# Patient Record
Sex: Male | Born: 1955
Health system: Southern US, Community
[De-identification: ages and names within clinical notes are randomized; demographics above are authoritative.]

## PROBLEM LIST (undated history)

## (undated) DIAGNOSIS — K259 Gastric ulcer, unspecified as acute or chronic, without hemorrhage or perforation: Secondary | ICD-10-CM

## (undated) DIAGNOSIS — E785 Hyperlipidemia, unspecified: Secondary | ICD-10-CM

## (undated) DIAGNOSIS — I10 Essential (primary) hypertension: Secondary | ICD-10-CM

## (undated) DIAGNOSIS — F329 Major depressive disorder, single episode, unspecified: Secondary | ICD-10-CM

## (undated) DIAGNOSIS — F419 Anxiety disorder, unspecified: Secondary | ICD-10-CM

## (undated) DIAGNOSIS — M1990 Unspecified osteoarthritis, unspecified site: Secondary | ICD-10-CM

## (undated) DIAGNOSIS — K5792 Diverticulitis of intestine, part unspecified, without perforation or abscess without bleeding: Secondary | ICD-10-CM

## (undated) DIAGNOSIS — R55 Syncope and collapse: Secondary | ICD-10-CM

## (undated) DIAGNOSIS — F32A Depression, unspecified: Secondary | ICD-10-CM

## (undated) HISTORY — DX: Essential (primary) hypertension: I10

## (undated) HISTORY — DX: Hyperlipidemia, unspecified: E78.5

## (undated) HISTORY — PX: COLON SURGERY: SHX602

## (undated) HISTORY — DX: Depression, unspecified: F32.A

## (undated) HISTORY — PX: OTHER SURGICAL HISTORY: SHX169

## (undated) HISTORY — DX: Anxiety disorder, unspecified: F41.9

## (undated) HISTORY — PX: COLOSTOMY: SHX63

---
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
## 2002-01-18 ENCOUNTER — Emergency Department (HOSPITAL_COMMUNITY): Admission: EM | Admit: 2002-01-18 | Discharge: 2002-01-18 | Payer: Self-pay | Admitting: *Deleted

## 2008-11-23 IMAGING — CR DG ABD PORTABLE 1V
1 series · 1 of 1 positions shown · non-contrast
Comparison: [DATE]

CLINICAL DATA: Colitis.  Abdominal pain.

ABDOMEN - 1 VIEW

[AP]
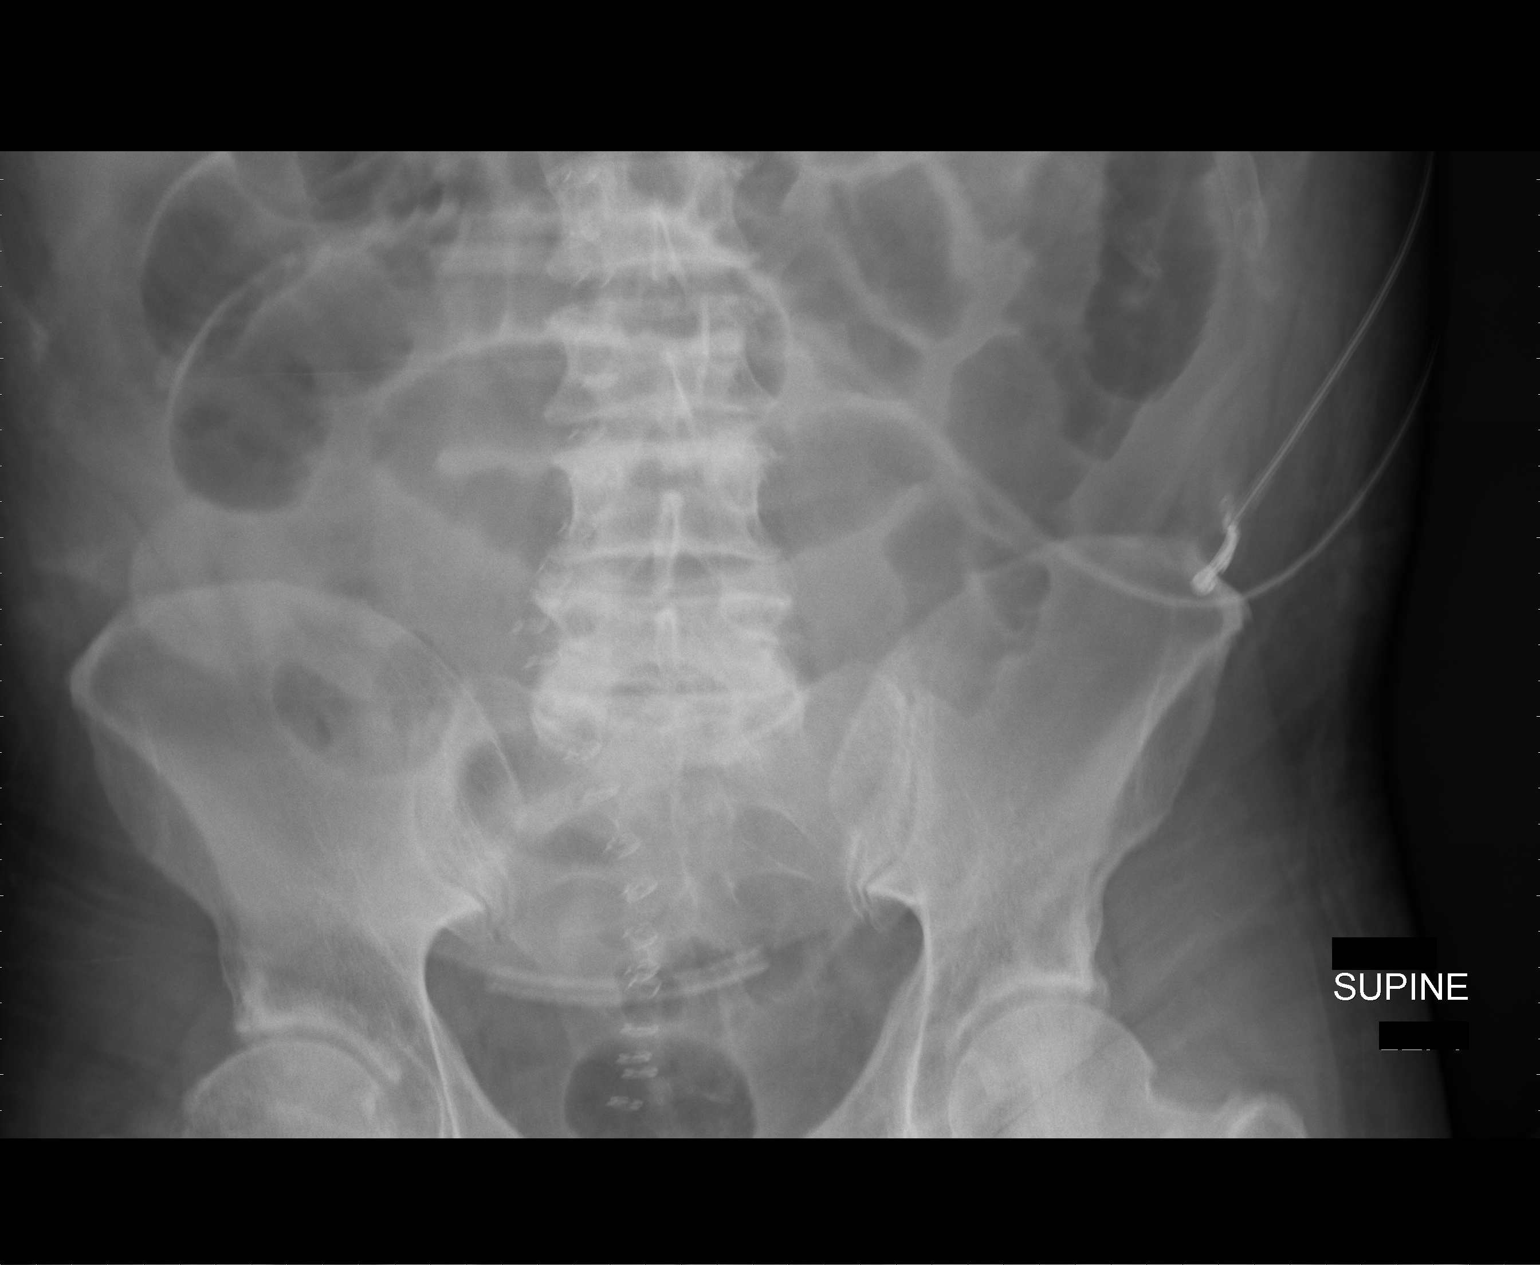

[1 of 1 positions shown; findings below may reference images not displayed]

FINDINGS: Vertical row of superficial staples projects over the
midline pelvis.  The gas distention of multiple bowel loops
primarily small bowel is compatible with an ileus.  Distention is
of a lesser degree than noted on [DATE].
IMPRESSION: Nonspecific ileus.

## 2008-11-24 ENCOUNTER — Inpatient Hospital Stay (HOSPITAL_COMMUNITY): Admission: EM | Admit: 2008-11-24 | Discharge: 2008-12-08 | Payer: Self-pay | Admitting: Emergency Medicine

## 2008-11-24 ENCOUNTER — Ambulatory Visit: Payer: Self-pay | Admitting: Pulmonary Disease

## 2008-11-24 IMAGING — CT CT PELVIS W/ CM
2 of 5 series · 16 of 46 positions shown, 18 images · IV contrast (APPLIED)
Comparison: None

CT ABDOMEN

CLINICAL DATA: Abdominal pain and distention.

CT ABDOMEN AND PELVIS WITH CONTRAST
TECHNIQUE: Multidetector CT imaging of the abdomen and pelvis was
performed using the standard protocol following bolus
administration of intravenous contrast.
Contrast: 100 ml [8M]

[Series 3: abd/pelv with 5.0 b31f st · axial · 0.87mm/px · z∈[-496,-46]mm · 13 of 101 slices shown, 15 images]
[im 6/101  soft-tissue]
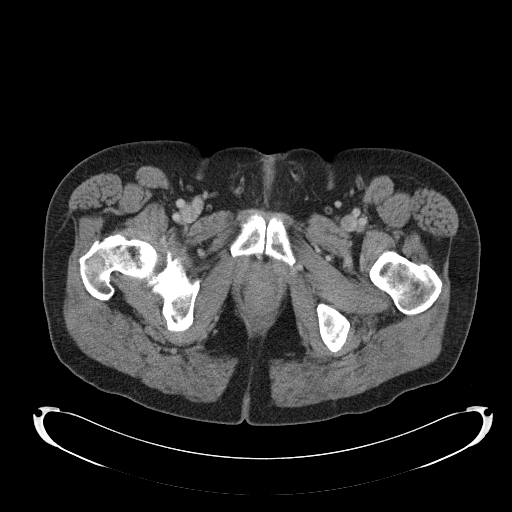
[im 6/101  bone]
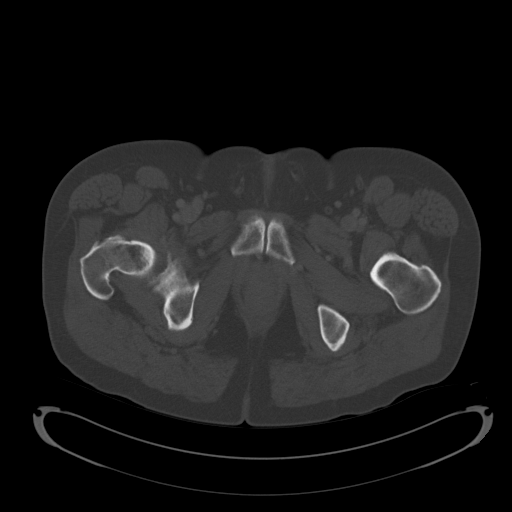
[im 16/101  soft-tissue]
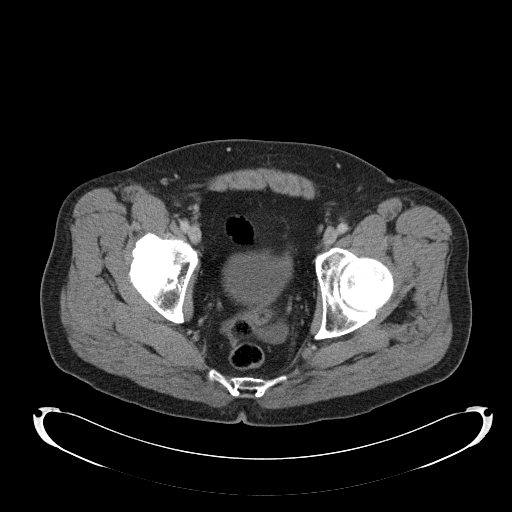
[im 21/101  soft-tissue]
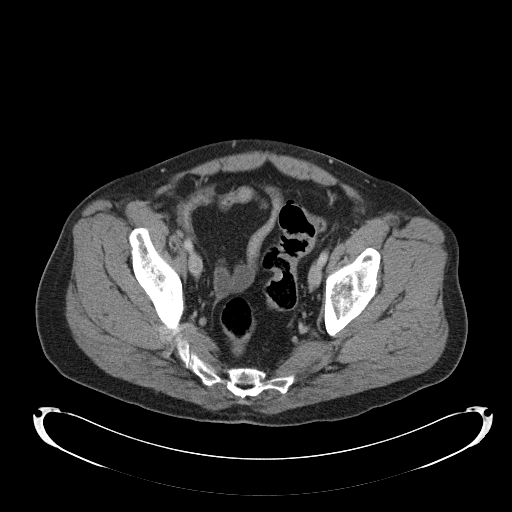
[im 31/101  soft-tissue]
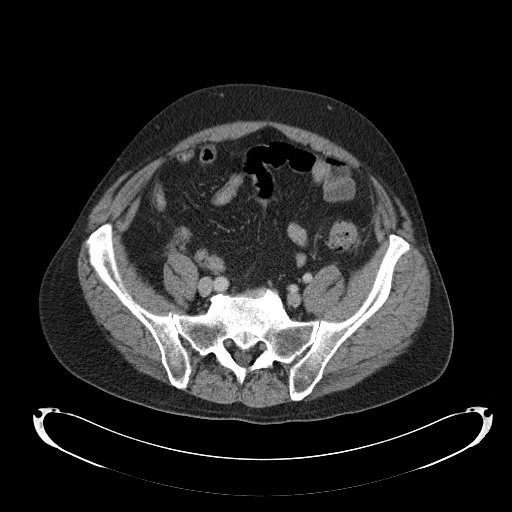
[im 36/101  soft-tissue]
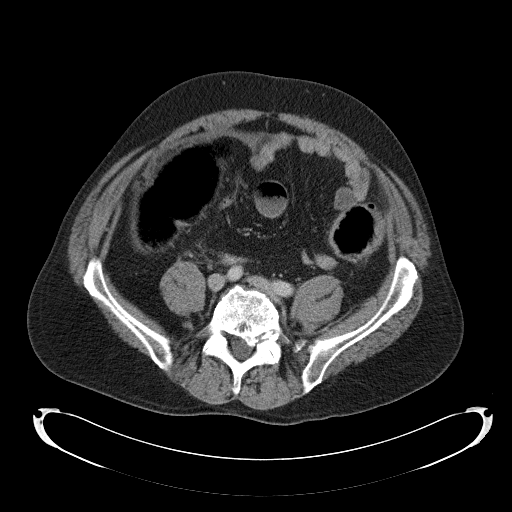
[im 46/101  soft-tissue]
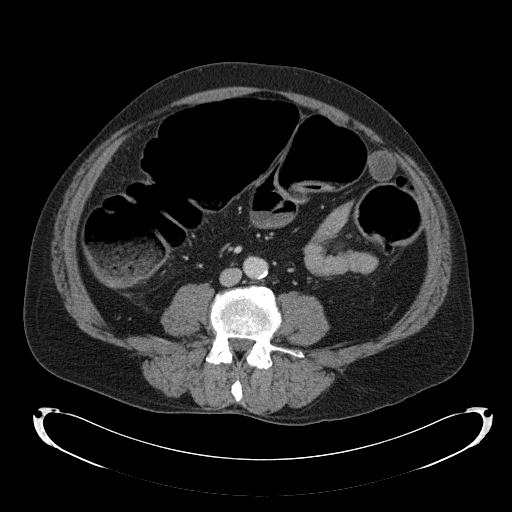
[im 51/101  soft-tissue]
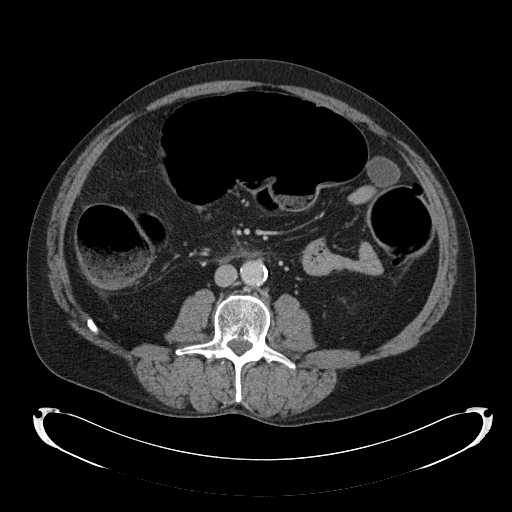
[im 56/101  soft-tissue]
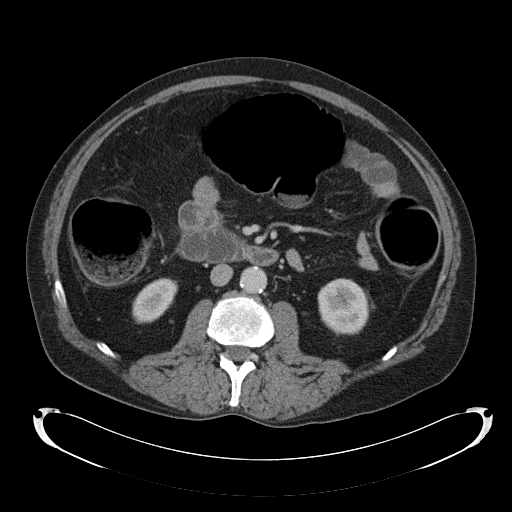
[im 66/101  soft-tissue]
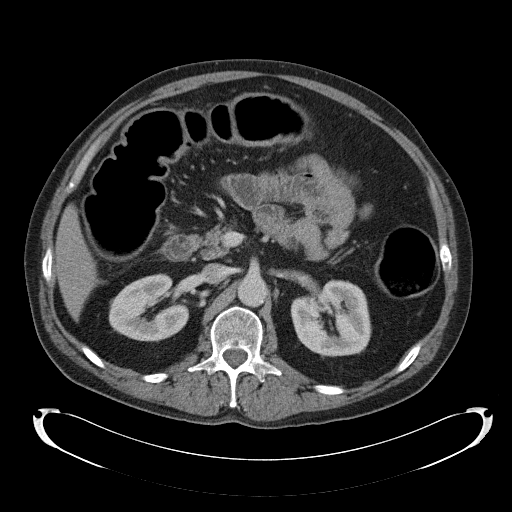
[im 66/101  bone]
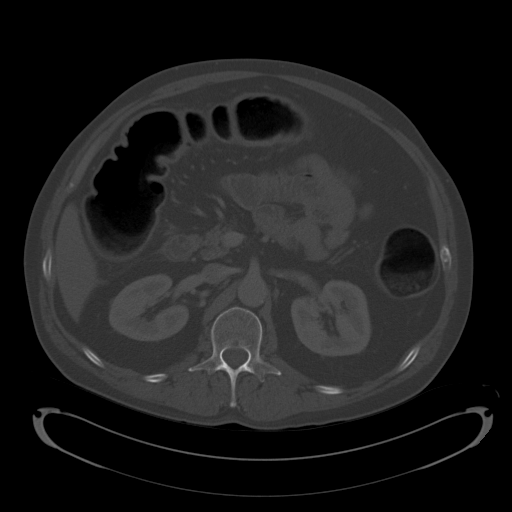
[im 71/101  soft-tissue]
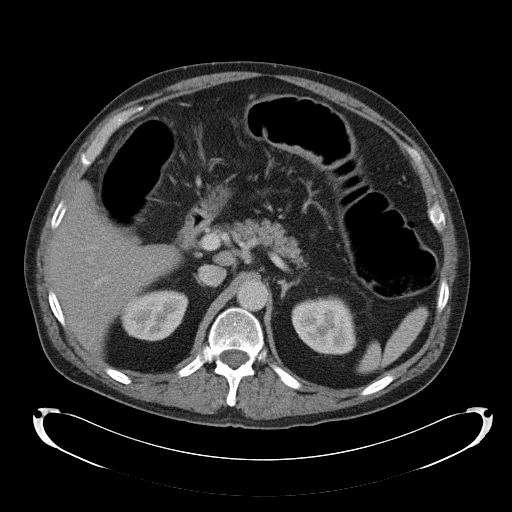
[im 81/101  soft-tissue]
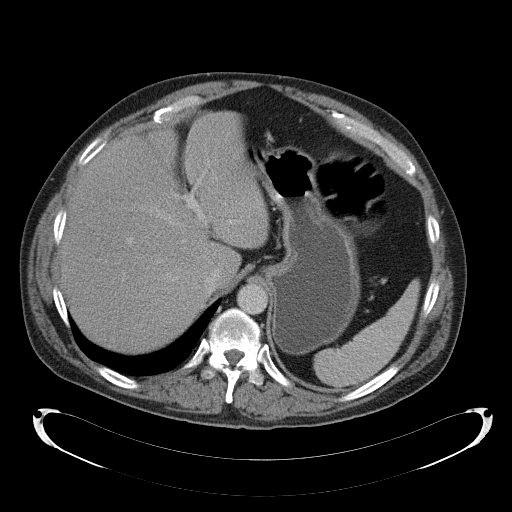
[im 86/101  soft-tissue]
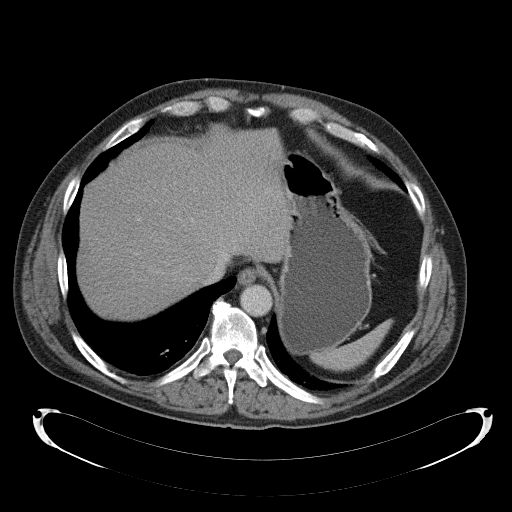
[im 96/101  soft-tissue]
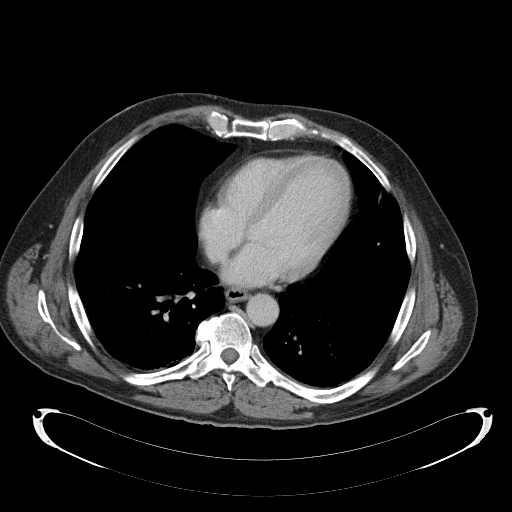

[Series 5: abd/pelv with 3.0 cor · coronal · 0.98mm/px · 3 of 92 slices shown]
[im 31/92  soft-tissue]
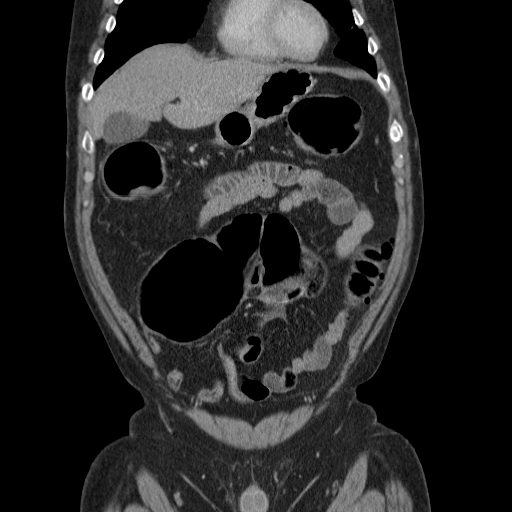
[im 41/92  soft-tissue]
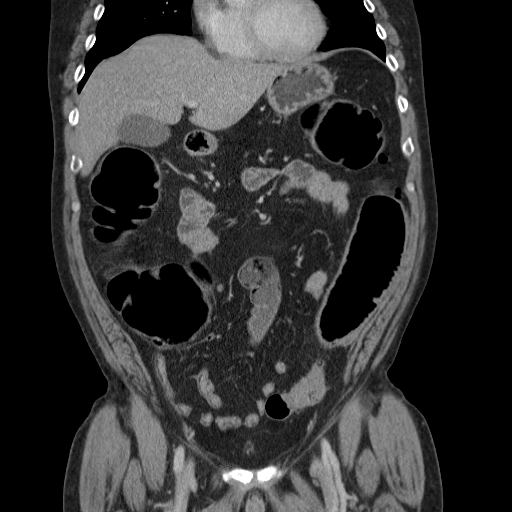
[im 51/92  soft-tissue]
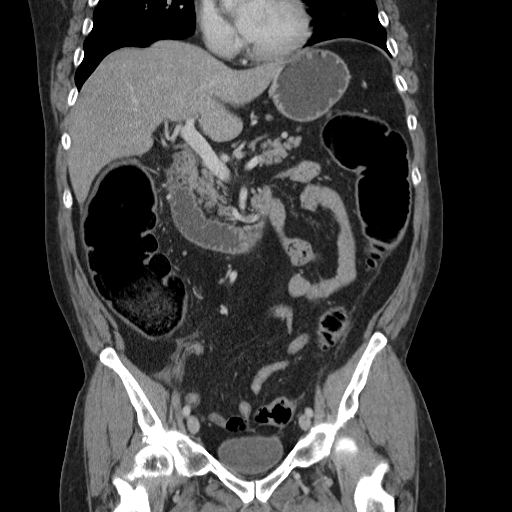

[16 of 46 positions shown; findings below may reference images not displayed]

FINDINGS: Lung bases are clear.  No pleural or pericardial fluid.
The patient does have a well circumscribed nodule on the right
middle lobe on image #1 that measures 5-6 mm in size. There is a 3
mm nodule the right middle lobe on image #6.

The liver appears normal.  No calcified gallstones.  The spleen is
normal.  The pancreas is normal.  The adrenal glands are normal.
The kidneys are normal.  There is atherosclerosis of the aorta but
no aneurysm.  The IVC is normal.  No retroperitoneal mass or
adenopathy.  No free intraperitoneal fluid .  There is pronounced
distention of the colon with low density stool and air.  The colon
is distended as far as the sigmoid region where it regains a more
normal caliber.  I think there is slight thickening of the wall of
the colon and there could be low-level colitis.  In the sigmoid
region, the patient has diverticulosis.  I do not  see definite
evidence of active diverticulitis.  Caliber change in this region
could be due to  muscular hypertrophy due to chronic
diverticulosis.  An occult mass is not excluded.  The appendix is
normal.
IMPRESSION: 5-6 mm nodule in the right middle lobe on image #1.  3 mm nodule on
the right middle lobe on image six.  Are there old films for
comparison?  If the patient is at low risk of malignancy, follow-up
CT is suggested at 12 months.  If the patient is at high risk of
malignancy, follow-up CT is suggested in 6 months. This
recommendation follows the consensus statement: "Guidelines for
Management of Small Pulmonary Nodules Detected on CT Scans:  A
Statement from the [HOSPITAL]" as published in Radiology
[8M]; [DATE].  Available online at:
[URL]

Distention of the colon with air and low density stool from the
cecum as far as the sigmoid region.  Some wall thickening of the
colon suggesting low-level colitis.  See above discussion.

CT PELVIS
FINDINGS: There is a tiny amount of free fluid in the pelvis.  The
bladder, prostate gland seminal vesicles are unremarkable.  No mass
or adenopathy.  See above discussion of bowel findings.
IMPRESSION: No additional significant findings in the pelvis.  See above.

## 2008-11-25 ENCOUNTER — Ambulatory Visit: Payer: Self-pay | Admitting: Gastroenterology

## 2008-11-25 IMAGING — CR DG ABDOMEN 1V
2 series · 2 of 2 positions shown · non-contrast
Comparison: CT abdomen pelvis of [DATE]

CLINICAL DATA: Abdominal pain, possible colitis

ABDOMEN - 1 VIEW

[t abdomen supine (1 of 2)]
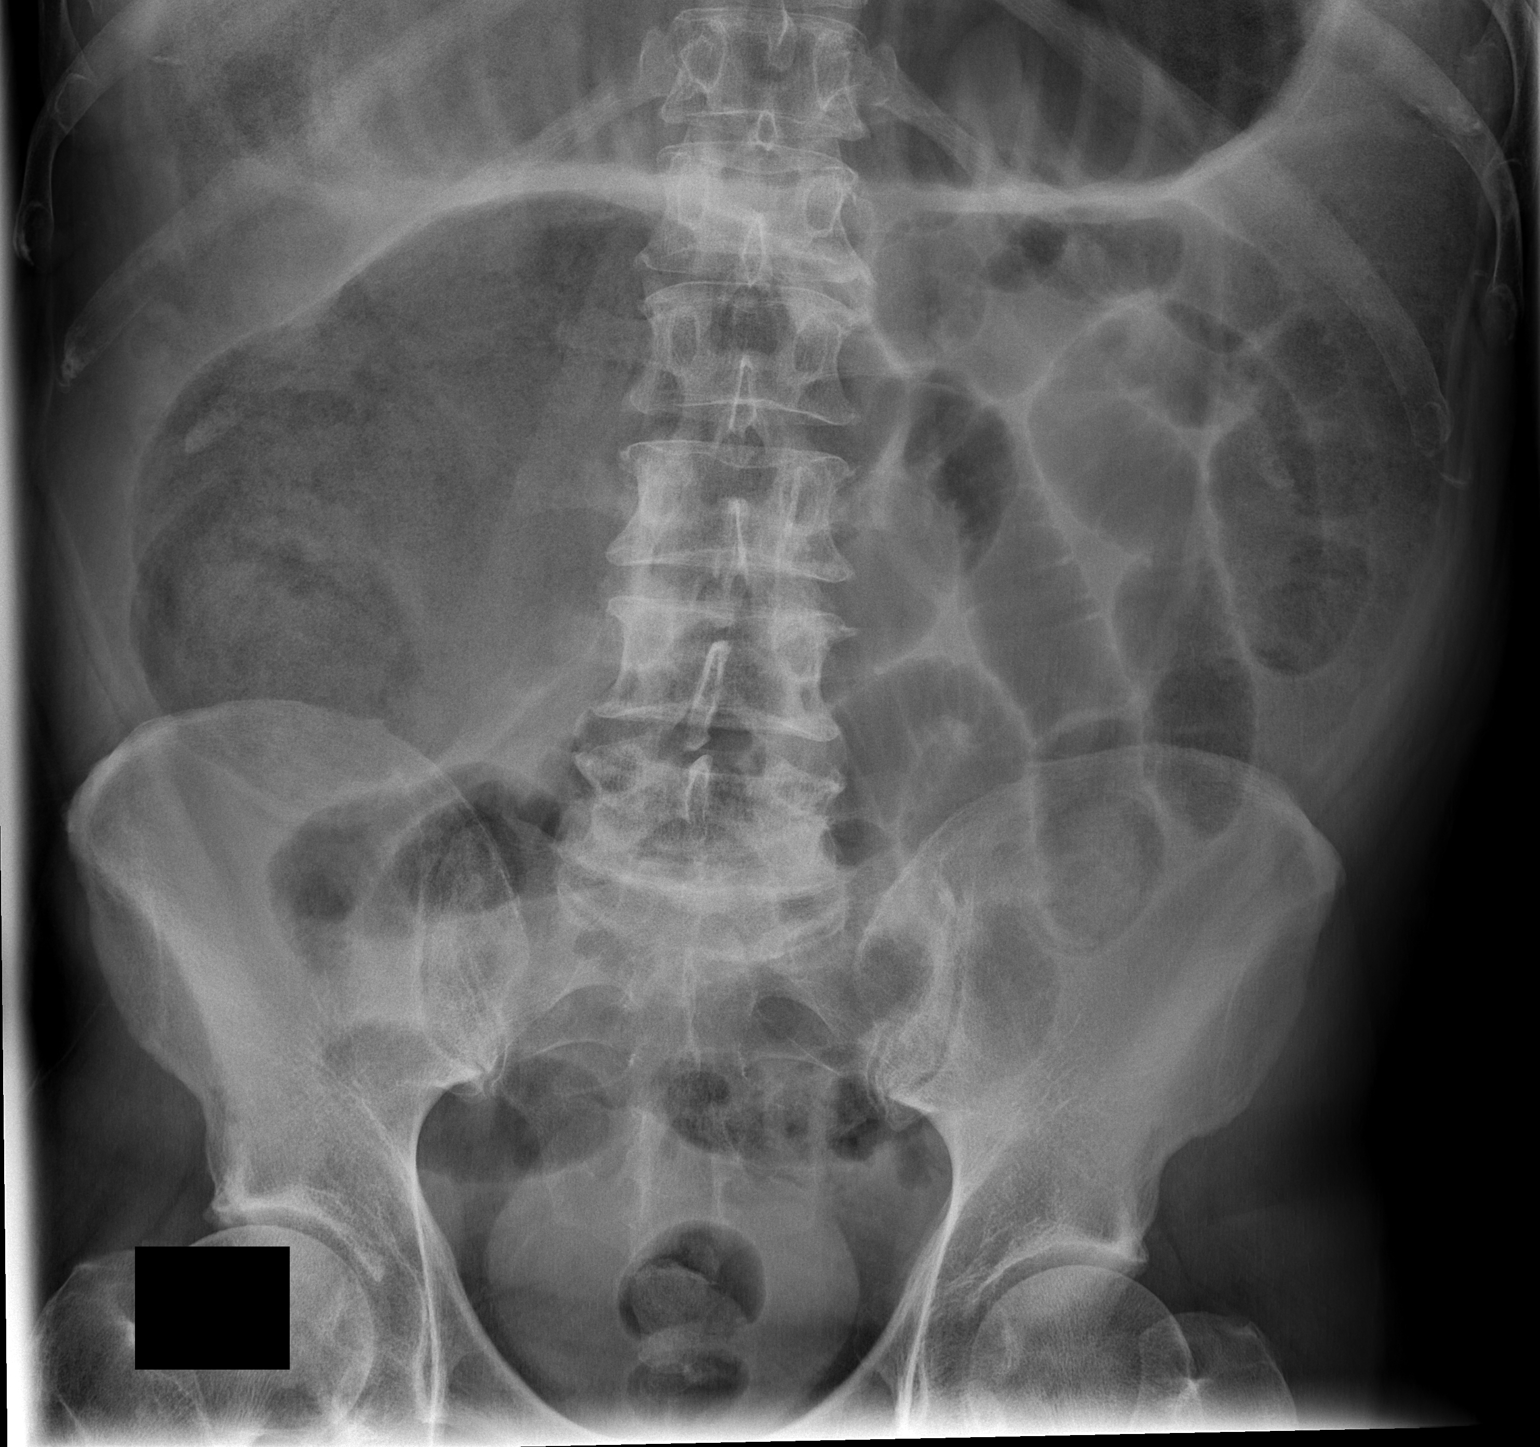

[t abdomen supine (2 of 2)]
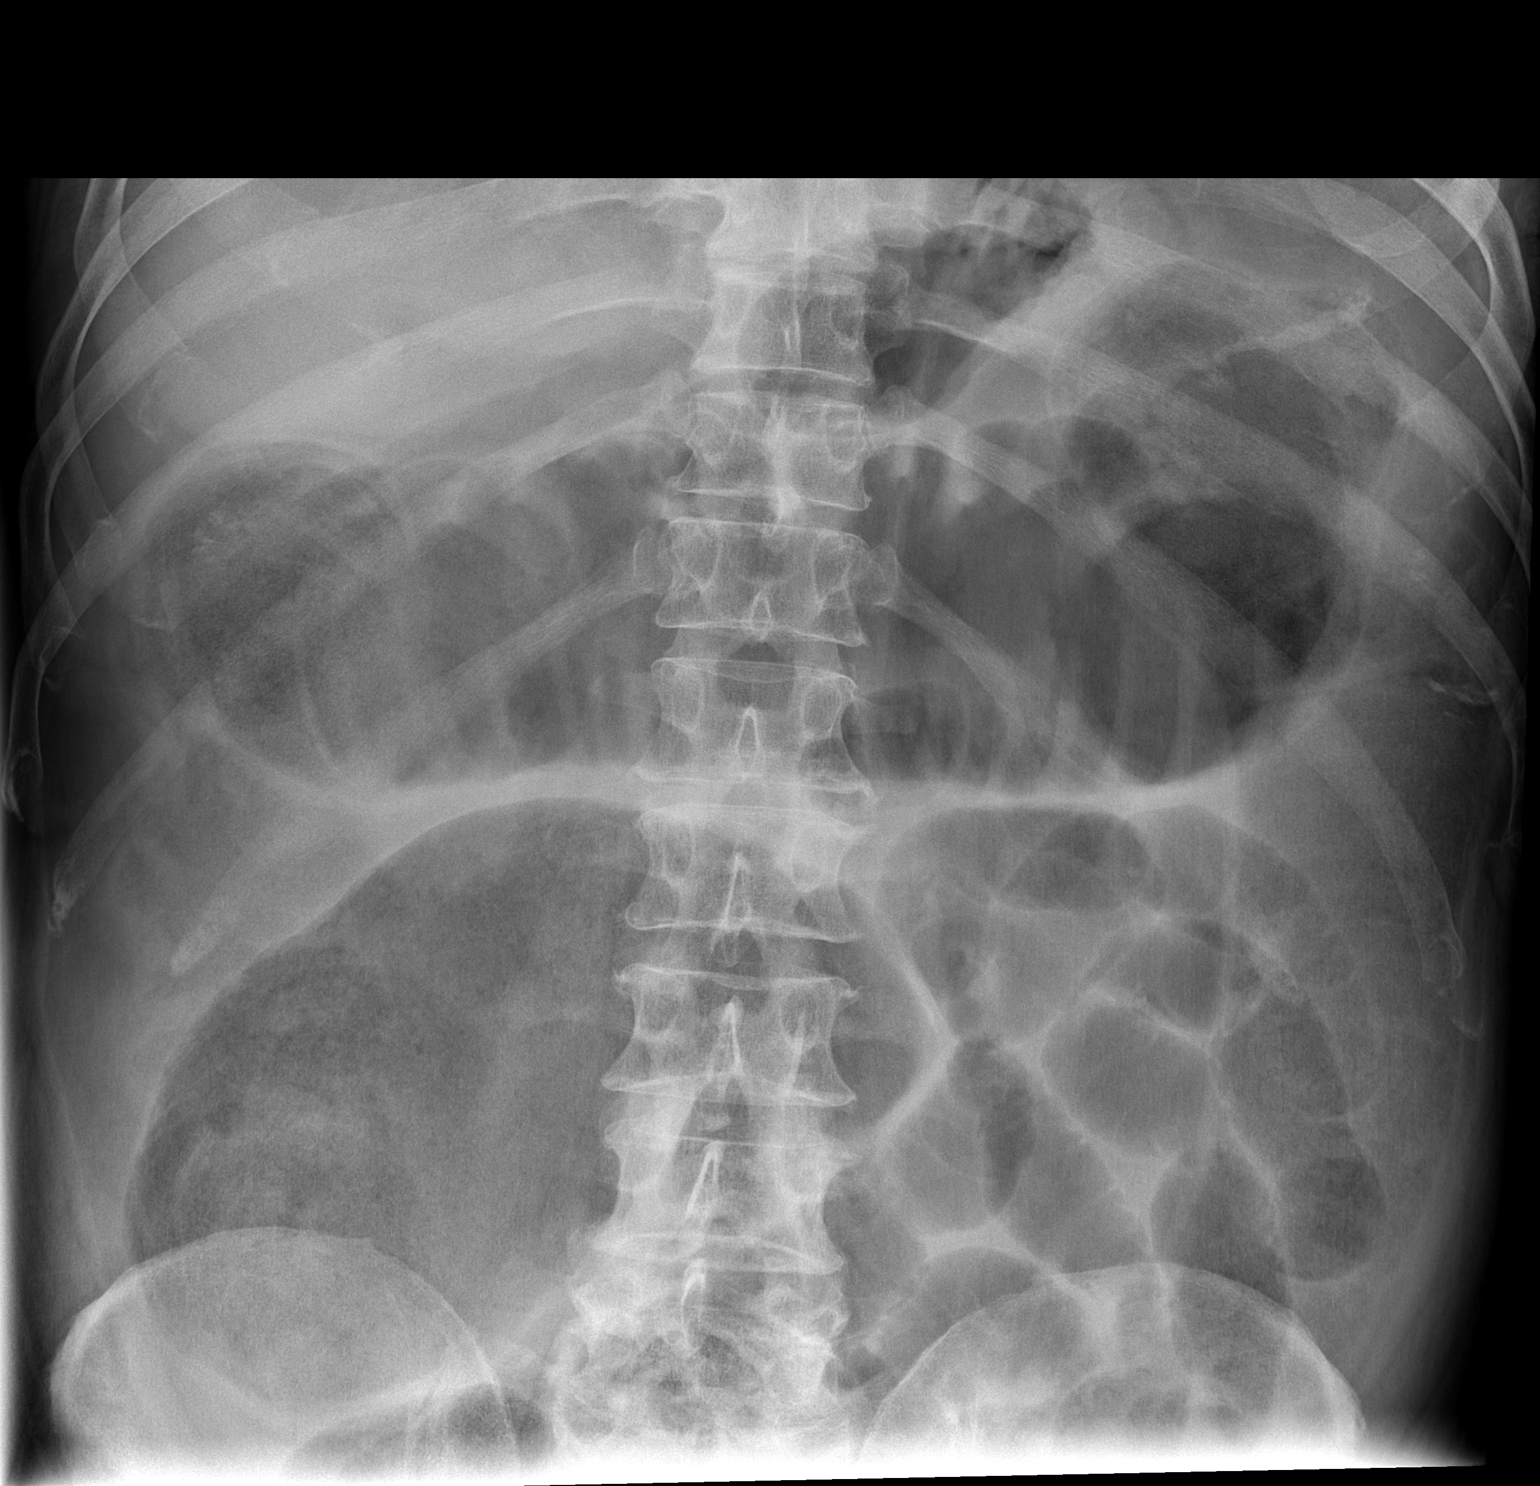

[2 of 2 positions shown; findings below may reference images not displayed]

FINDINGS: There is gaseous distention of the right colon and small
bowel.  In reviewing the CT from yesterday, these changes may be
due to colitis with some thickening of the mucosa of the colon
despite the degree of gaseous distention.  A partially obstructing
distal descending colon lesion cannot be excluded.
IMPRESSION: No significant change in gaseous distention of the small bowel and
colon as described above.  Again consider changes of colitis versus
a partially obstructing distal descending colon lesion.

## 2008-11-26 ENCOUNTER — Encounter: Payer: Self-pay | Admitting: Gastroenterology

## 2008-11-26 IMAGING — RF DG ABDOMEN 2V
1 series · 3 of 3 positions shown · non-contrast
Comparison: Abdomen film of [DATE]

CLINICAL DATA: Abdominal pain, colitis

ABDOMEN - 2 VIEW

[Series 1: run · 3 of 3 slices shown]
[im 1/3]
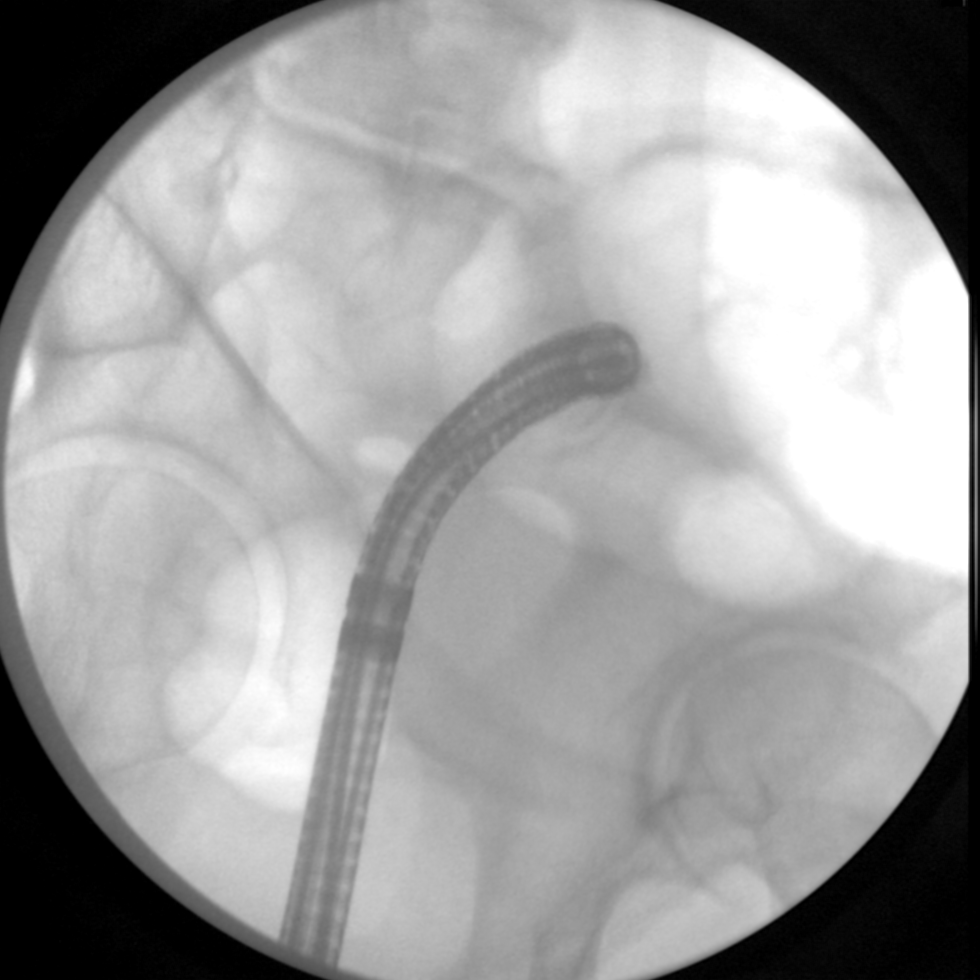
[im 2/3]
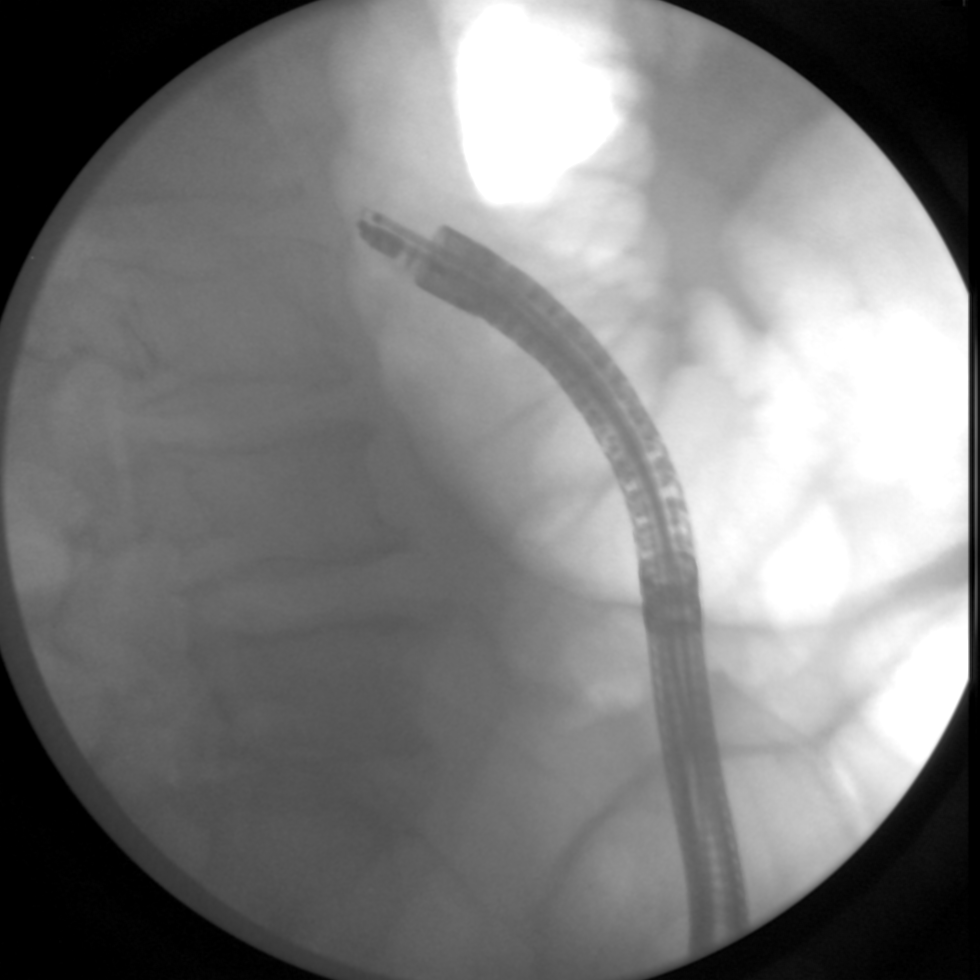
[im 3/3]
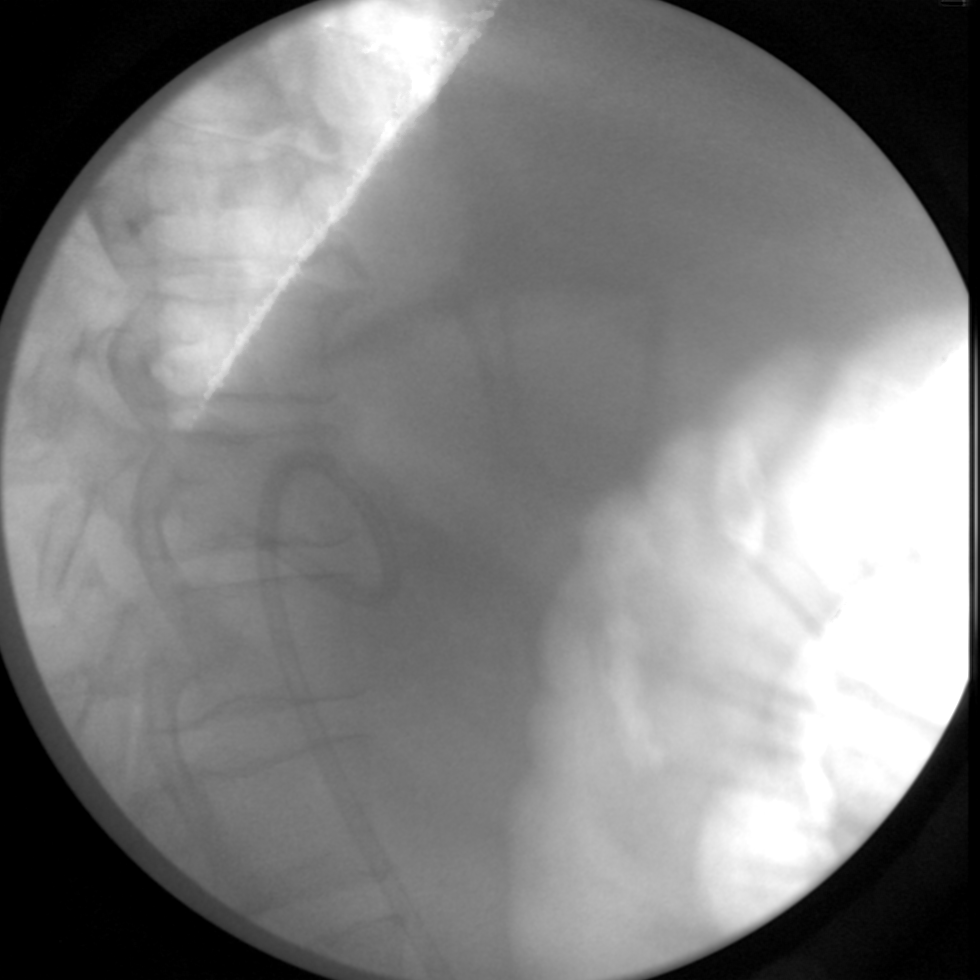

[3 of 3 positions shown; findings below may reference images not displayed]

FINDINGS: A rectal tube was placed for colonic decompression.  On
the limited field of view films obtained it is difficult to
determine exact position of the tube.
IMPRESSION: Rectal tube placed for colonic decompression.

## 2008-11-26 IMAGING — CR DG CHEST 2V
2 series · 2 of 2 positions shown · non-contrast
Comparison: No chest radiograph comparison.

CLINICAL DATA: Abdominal pain.  Colitis.

CHEST - 2 VIEW

[w chest pa]
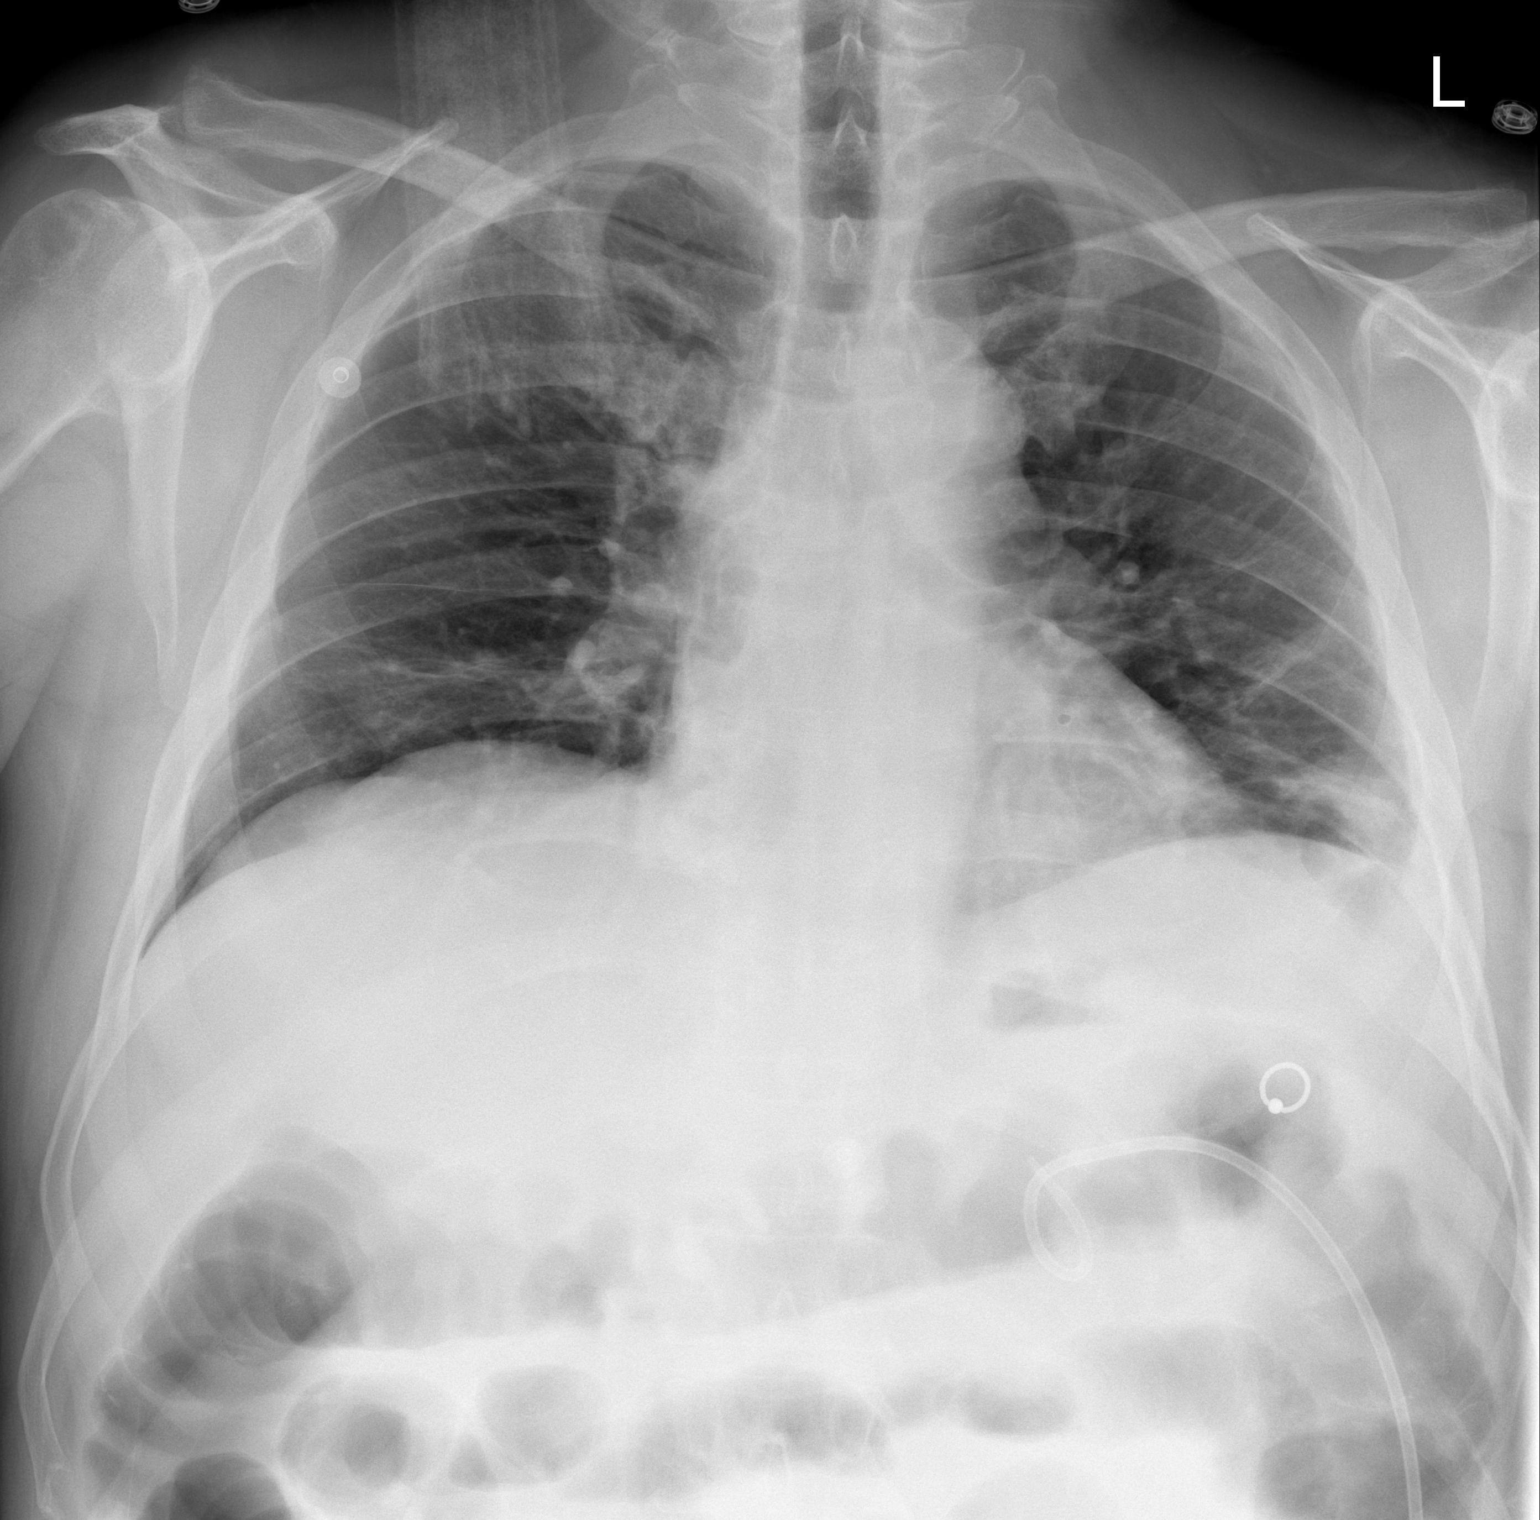

[w chest lat]
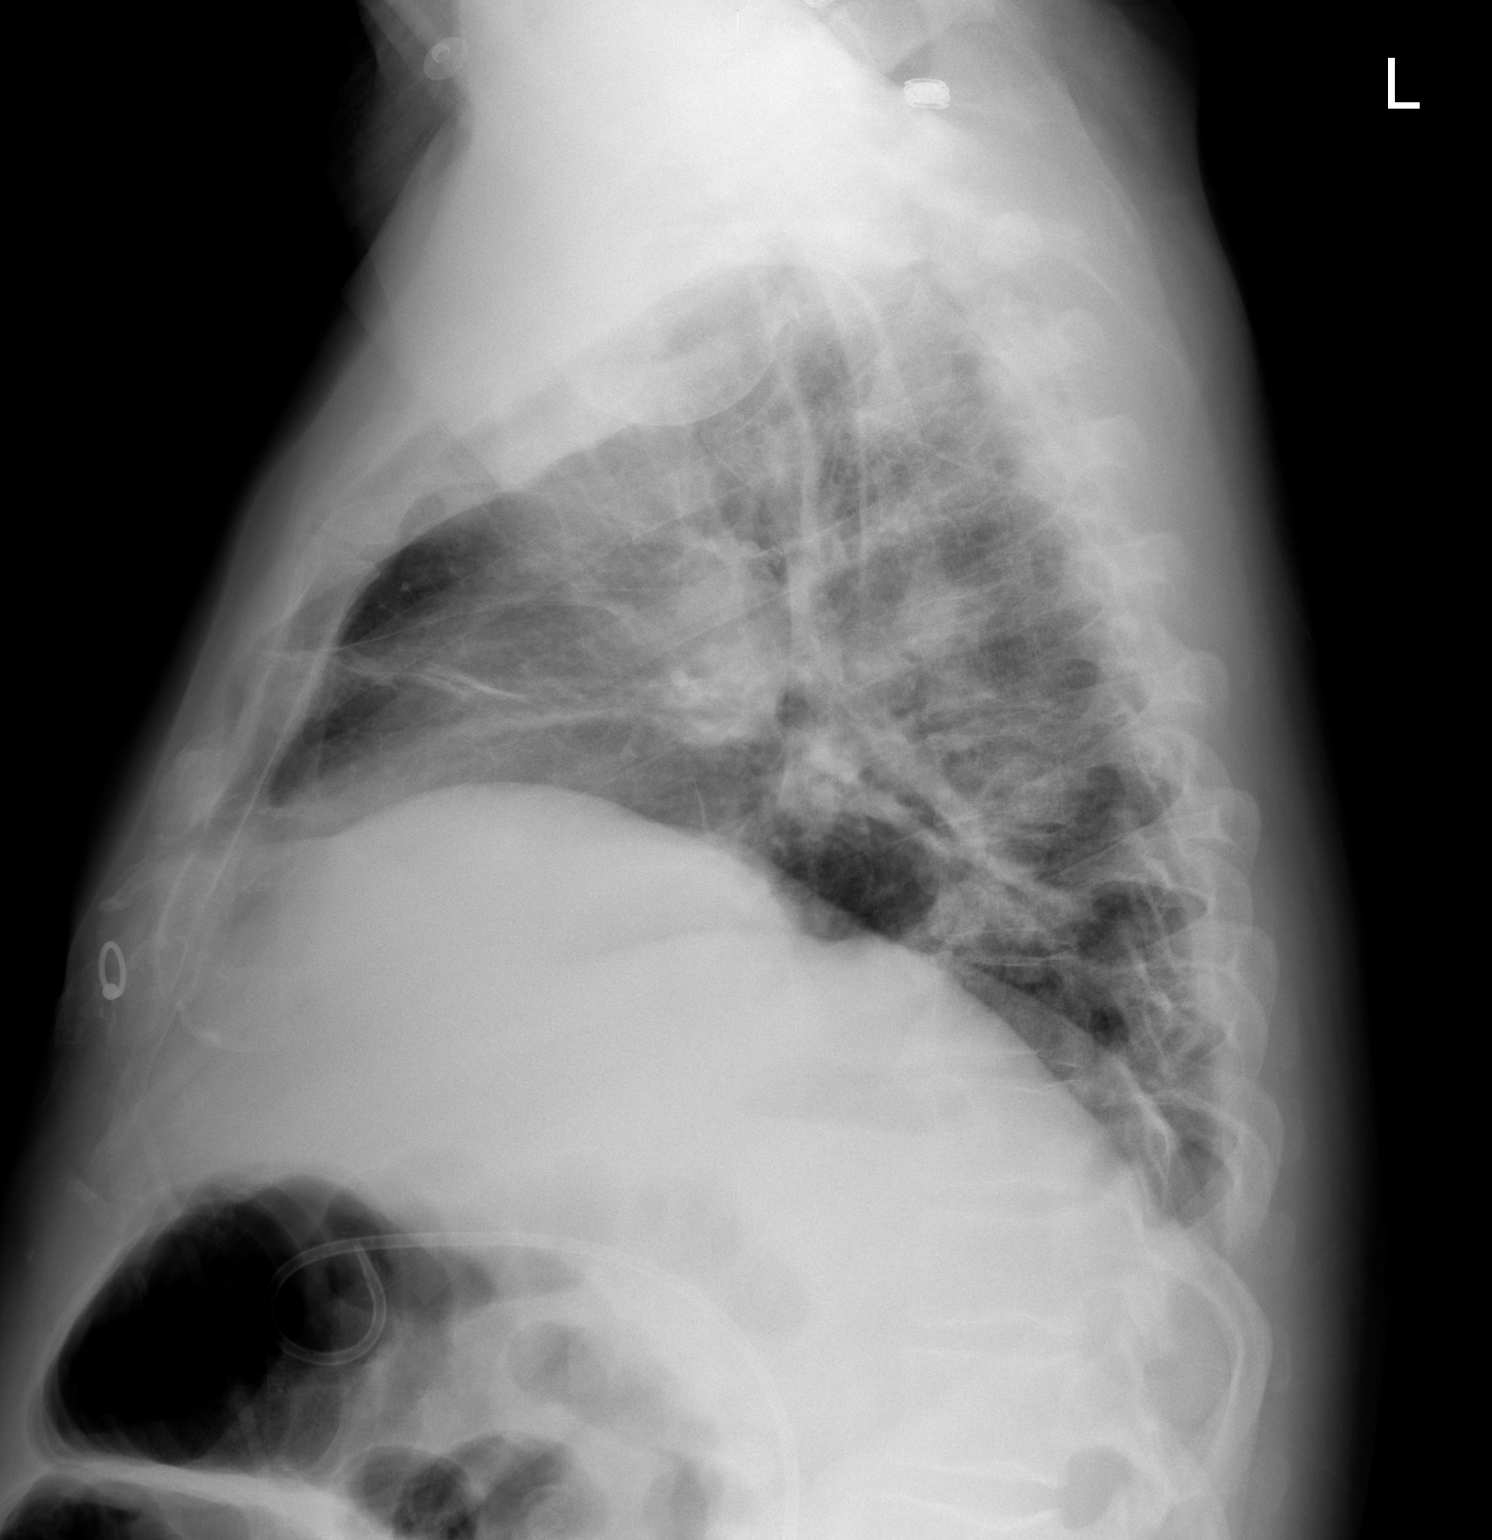

[2 of 2 positions shown; findings below may reference images not displayed]

FINDINGS: Lung volumes are low.  There is bibasilar atelectasis.
More focal density is present at the left costophrenic angle, which
probably represents discoid atelectasis.  It is difficult to
exclude a small focus of airspace disease based on the opacity
however given the low inspiratory volumes, atelectasis seems more
likely.  The cardiopericardial silhouette is within normal limits
allowing for low volumes.  Aortic contour normal.  Object external
to the patient, likely clothing is projected over the right upper
chest.  Catheter is present in the left upper quadrant.
IMPRESSION: 1.  Low volume chest with bibasilar atelectasis.  More focal
density at the left costophrenic angle probably represents discoid
atelectasis over the focus of infection/pneumonia.

## 2008-11-27 ENCOUNTER — Encounter (INDEPENDENT_AMBULATORY_CARE_PROVIDER_SITE_OTHER): Payer: Self-pay | Admitting: General Surgery

## 2008-11-27 IMAGING — CR DG ABDOMEN 1V
2 series · 2 of 2 positions shown · non-contrast
Comparison: Plain film chest [DATE] abdomen [DATE]

CLINICAL DATA: Abdominal pain, colitis

ABDOMEN - 1 VIEW

[t abdomen supine (1 of 2)]
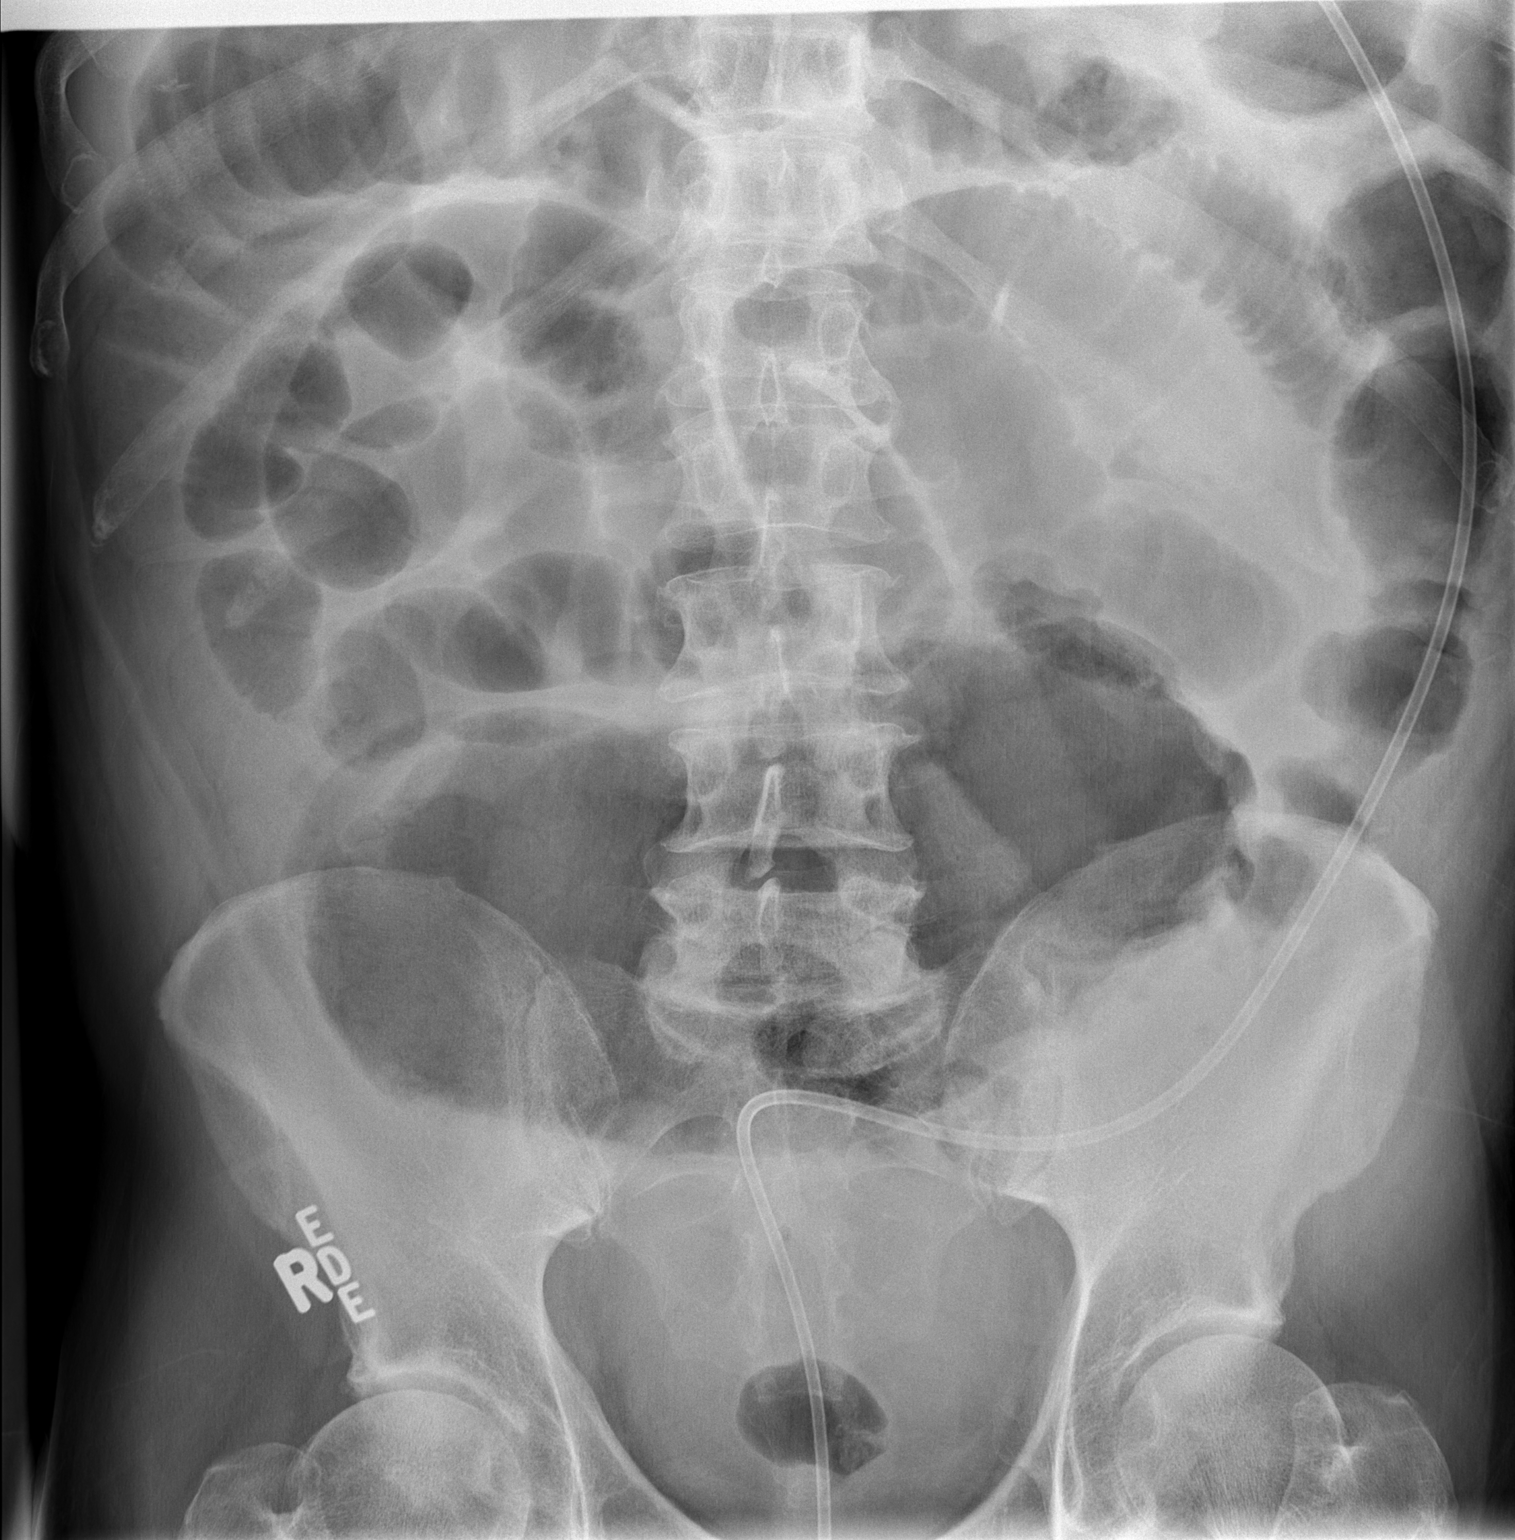

[t abdomen supine (2 of 2)]
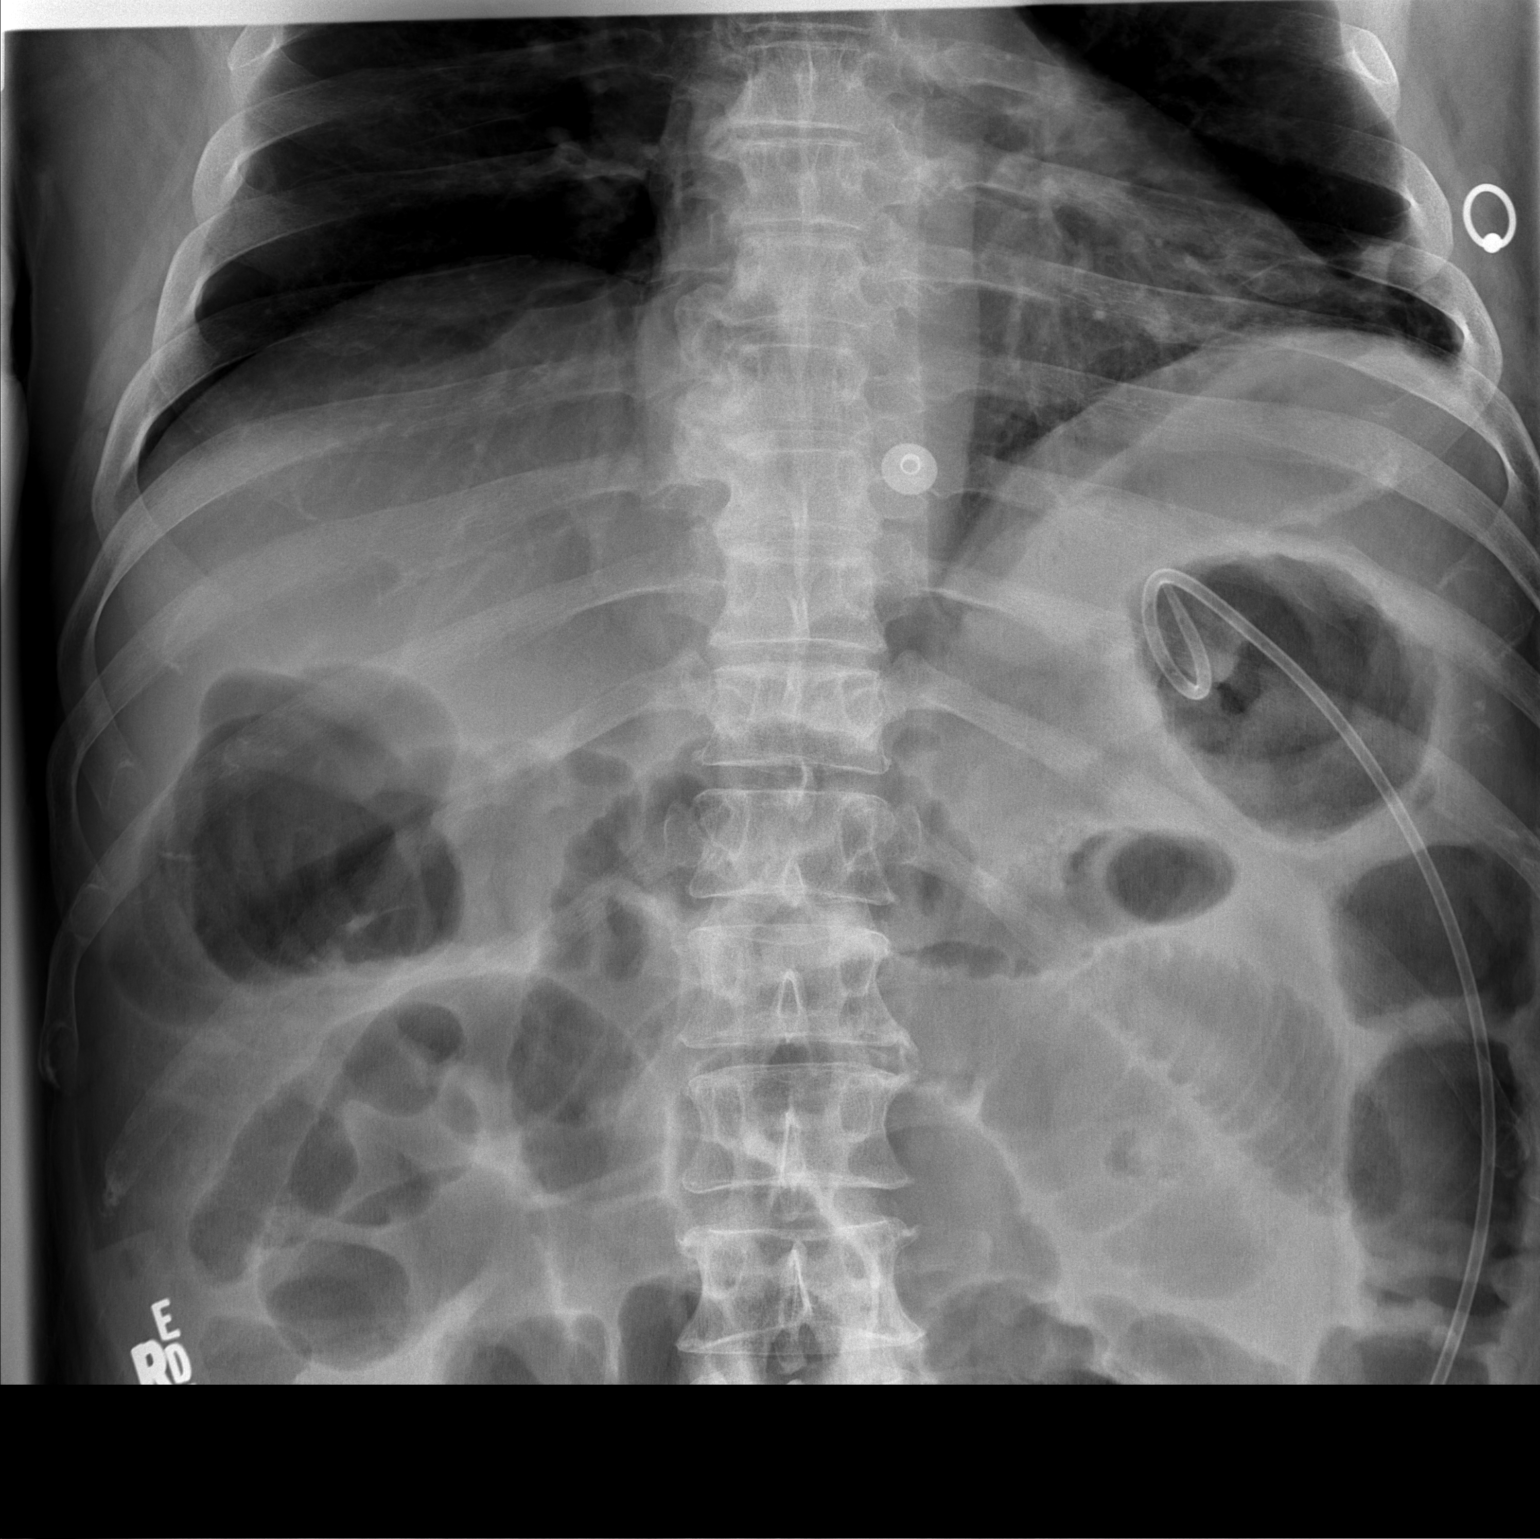

[2 of 2 positions shown; findings below may reference images not displayed]

FINDINGS: There is a pigtail catheter which appears to be within
the lumen of the colon with tip at the splenic flexure and
unchanged from prior.  There multiple gas-filled loops of small
bowel which are mildly dilated in the left upper quadrant to
cm.  This appears slightly progressed from comparison plain films
of the abdomen.  There is a large amount gas within the cecum which
is unchanged from prior.  There is gas in the rectosigmoid colon.
IMPRESSION: 1. Mild  interval increase in size of gas distended loops of small
bowel suggest ileus.  Cannot completely exclude mechanical
obstruction.
2.  Catheter which appears within the lumen of the large bowel is
unchanged.
3.  Gas distended cecum is unchanged from prior.

## 2008-11-29 IMAGING — CR DG CHEST 1V PORT
1 series · 1 of 1 positions shown · non-contrast
Comparison: [DATE]

CLINICAL DATA: Abdominal pain, colitis.

PORTABLE CHEST - 1 VIEW

[AP]
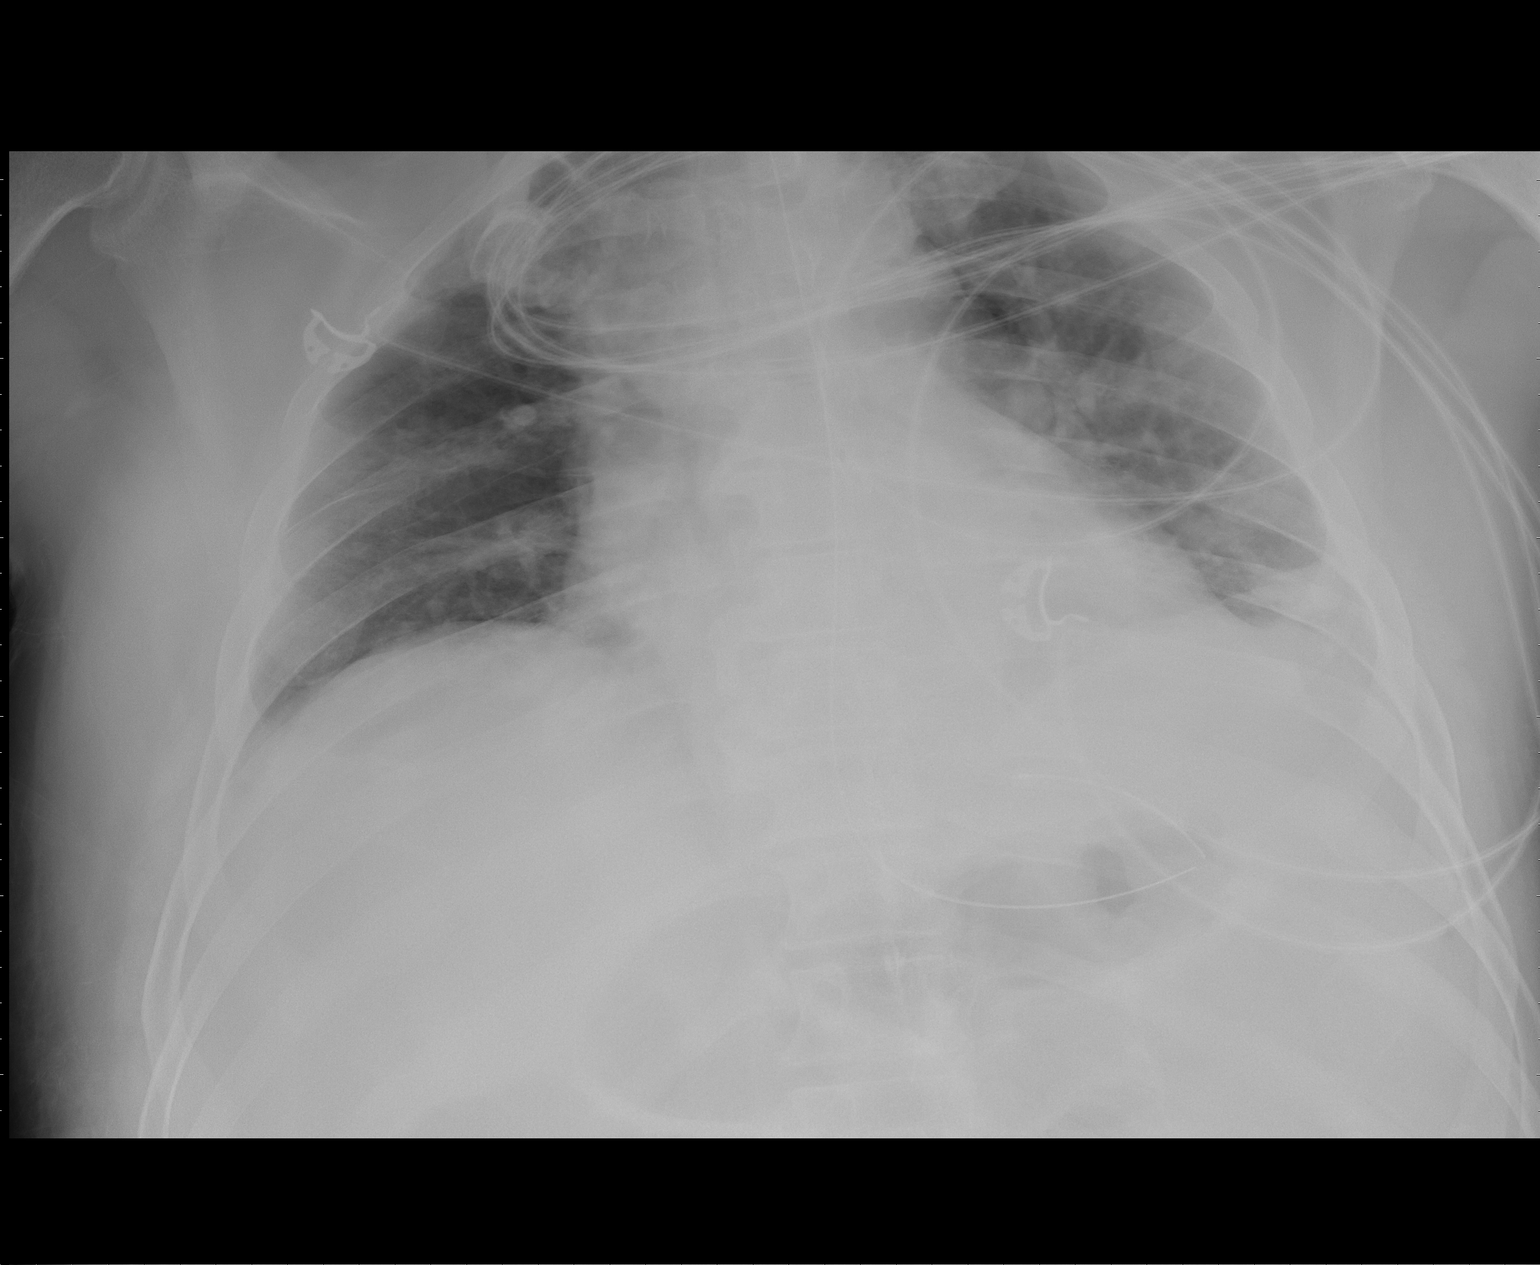

[1 of 1 positions shown; findings below may reference images not displayed]

FINDINGS: Nasogastric tube terminates in the stomach.  Heart size
is grossly stable.  Lungs are low in volume with scattered
atelectasis.
IMPRESSION: Low lung volumes with scattered atelectasis.

## 2008-11-30 IMAGING — CR DG CHEST 1V PORT
1 series · 1 of 1 positions shown · non-contrast
Comparison: [DATE]

CLINICAL DATA: Respiratory stress.

PORTABLE CHEST - 1 VIEW

[AP]
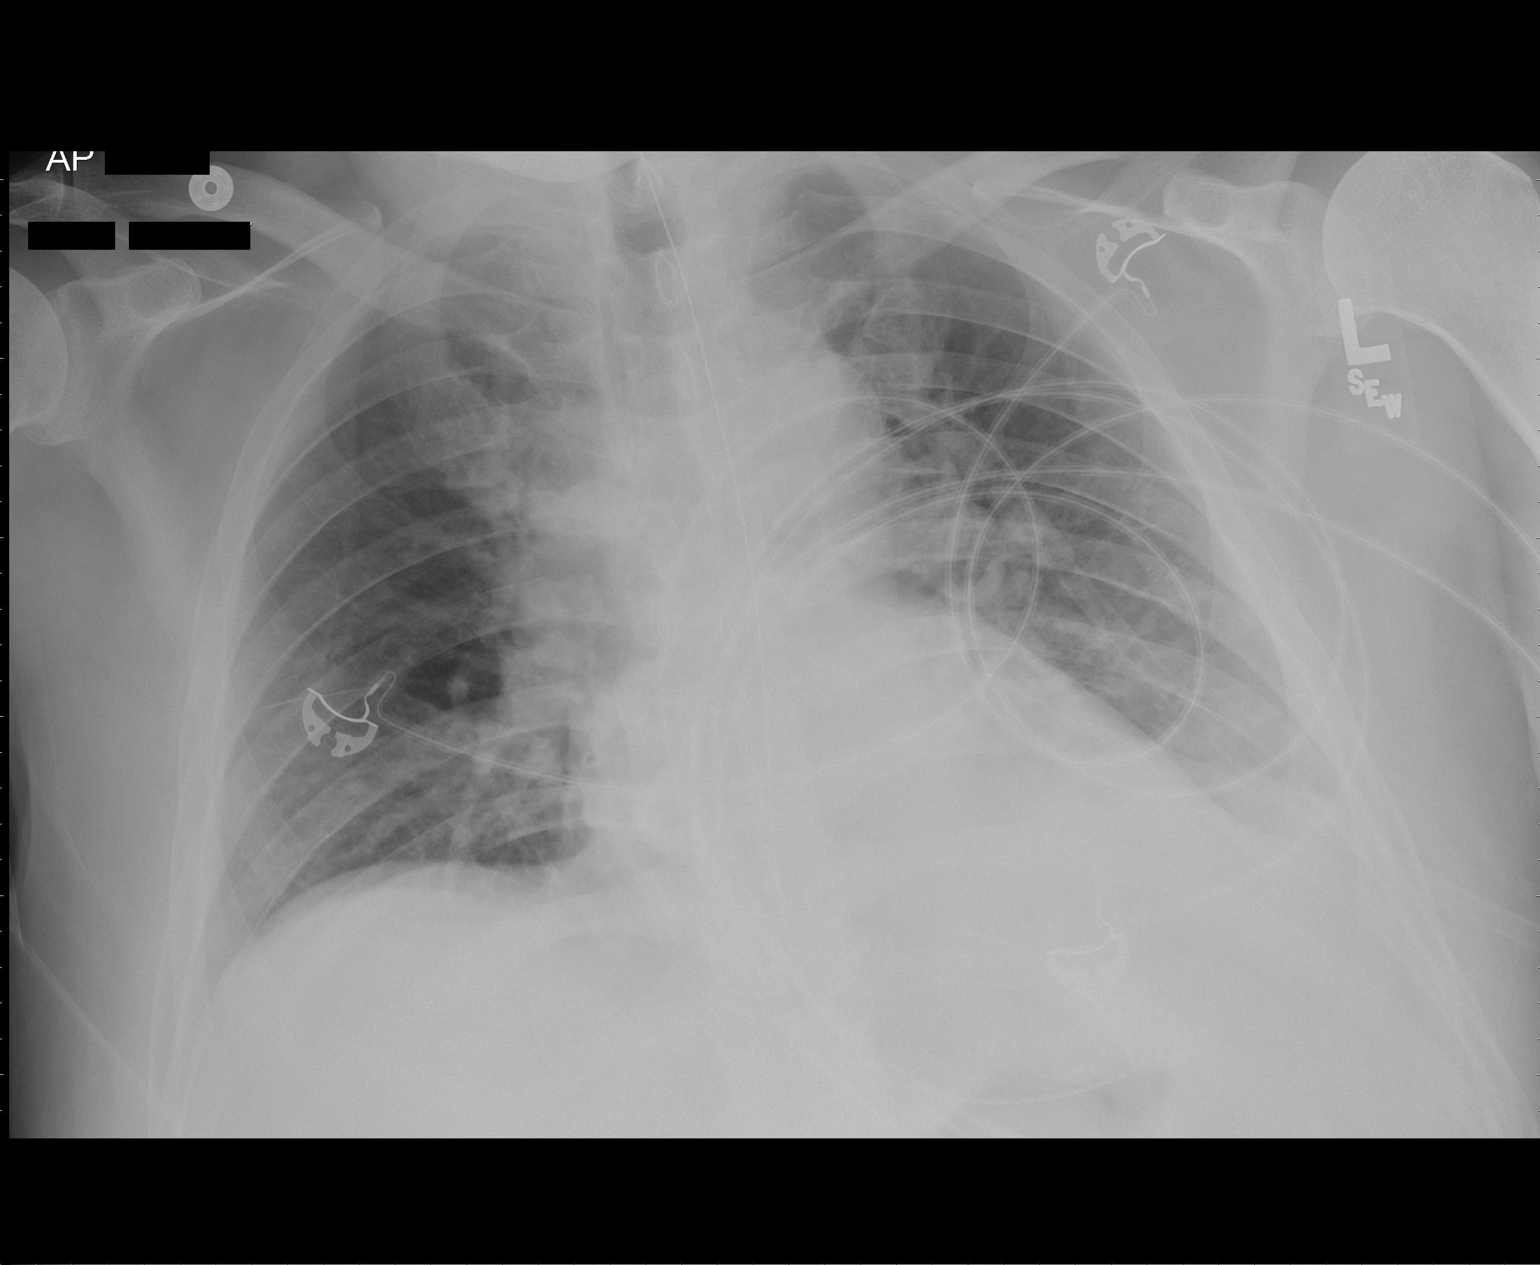

[1 of 1 positions shown; findings below may reference images not displayed]

FINDINGS: Mild scattered atelectasis persist. Lung aeration is
improved. No focal consolidations.  No pulmonary edema.  NG tube is
in the stomach.
IMPRESSION: Improved aeration of the lungs.  Minimal atelectasis.

## 2008-12-02 IMAGING — CR DG CHEST 1V PORT
1 series · 1 of 1 positions shown · non-contrast
Comparison: [DATE]

CLINICAL DATA: Colitis

PORTABLE CHEST - 1 VIEW

[view not recorded]
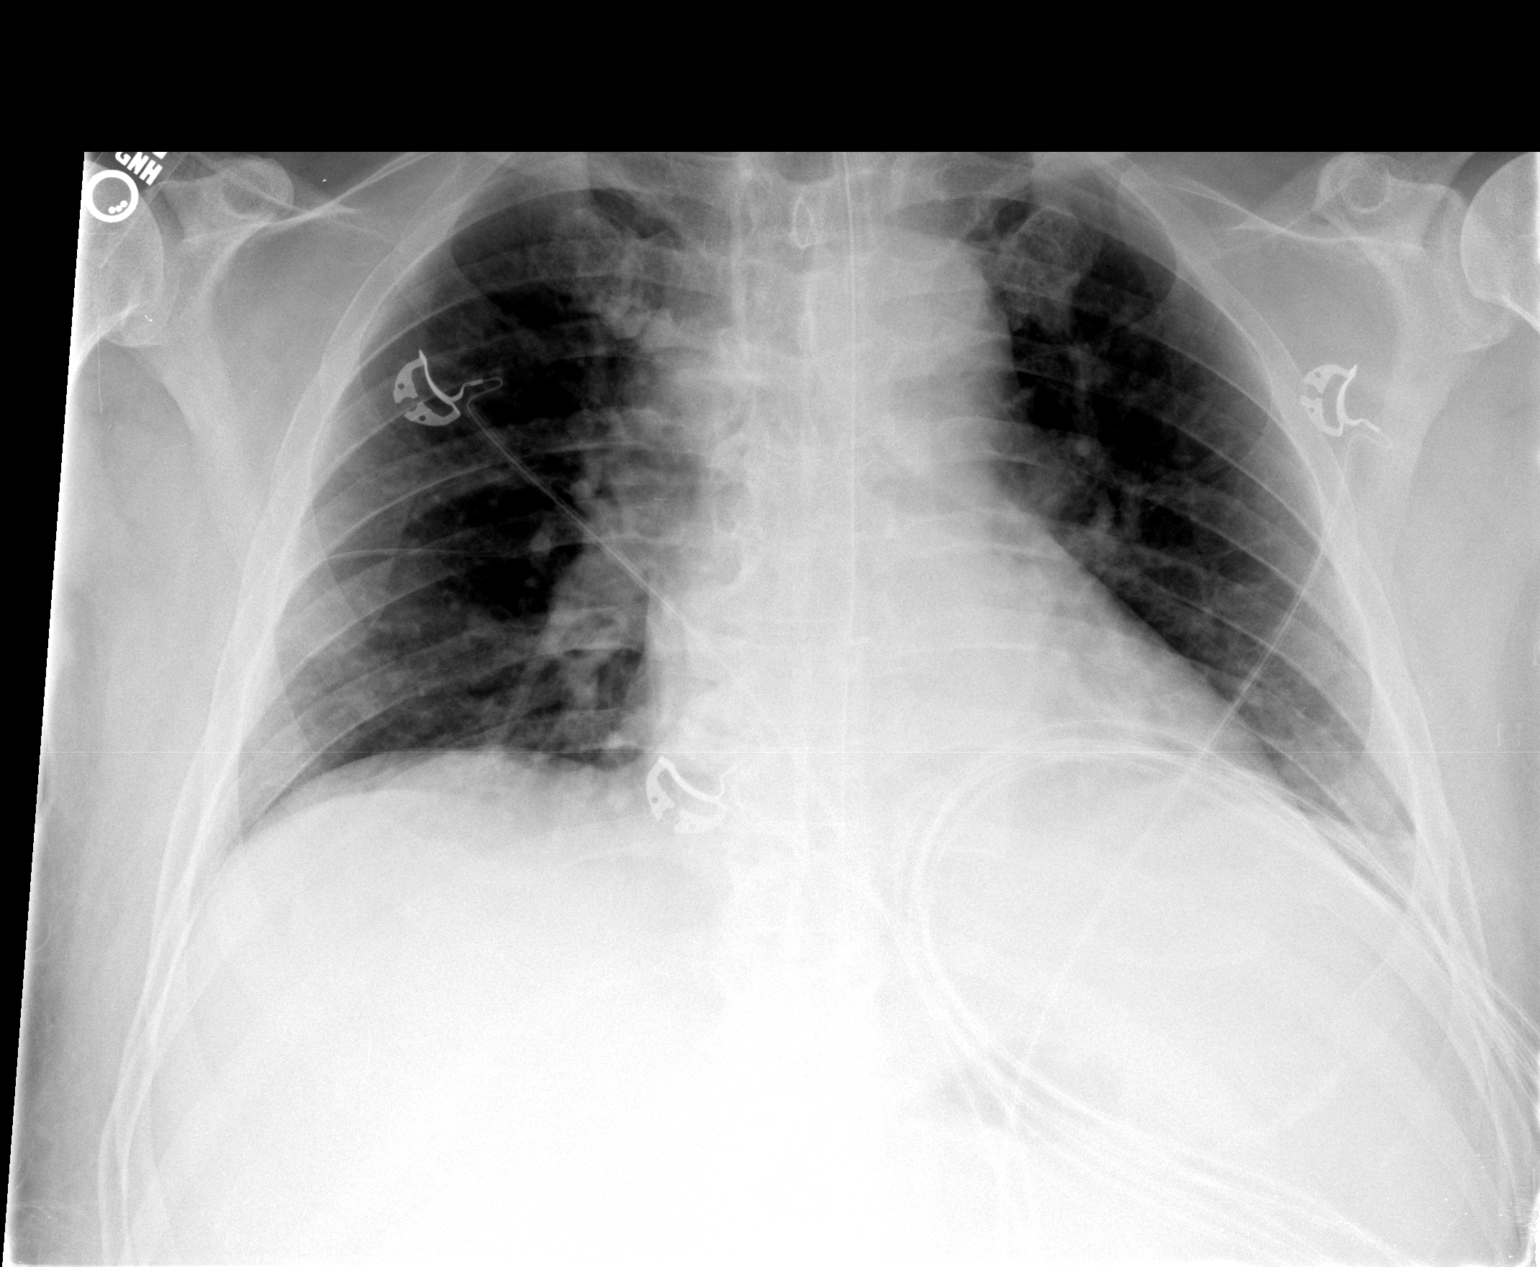

[1 of 1 positions shown; findings below may reference images not displayed]

FINDINGS: Lungs remain under aerated with basilar hypo aeration.
NG tube stable.  Heart normal in size.  No pneumothorax.
IMPRESSION: Stable basilar hypoaeration.

## 2008-12-07 IMAGING — CR DG HUMERUS 2V *L*
3 series · 3 of 3 positions shown · non-contrast
Comparison: None

CLINICAL DATA: Pain, no known injury

LEFT HUMERUS - 2+ VIEW

[w shoulder ap external left]
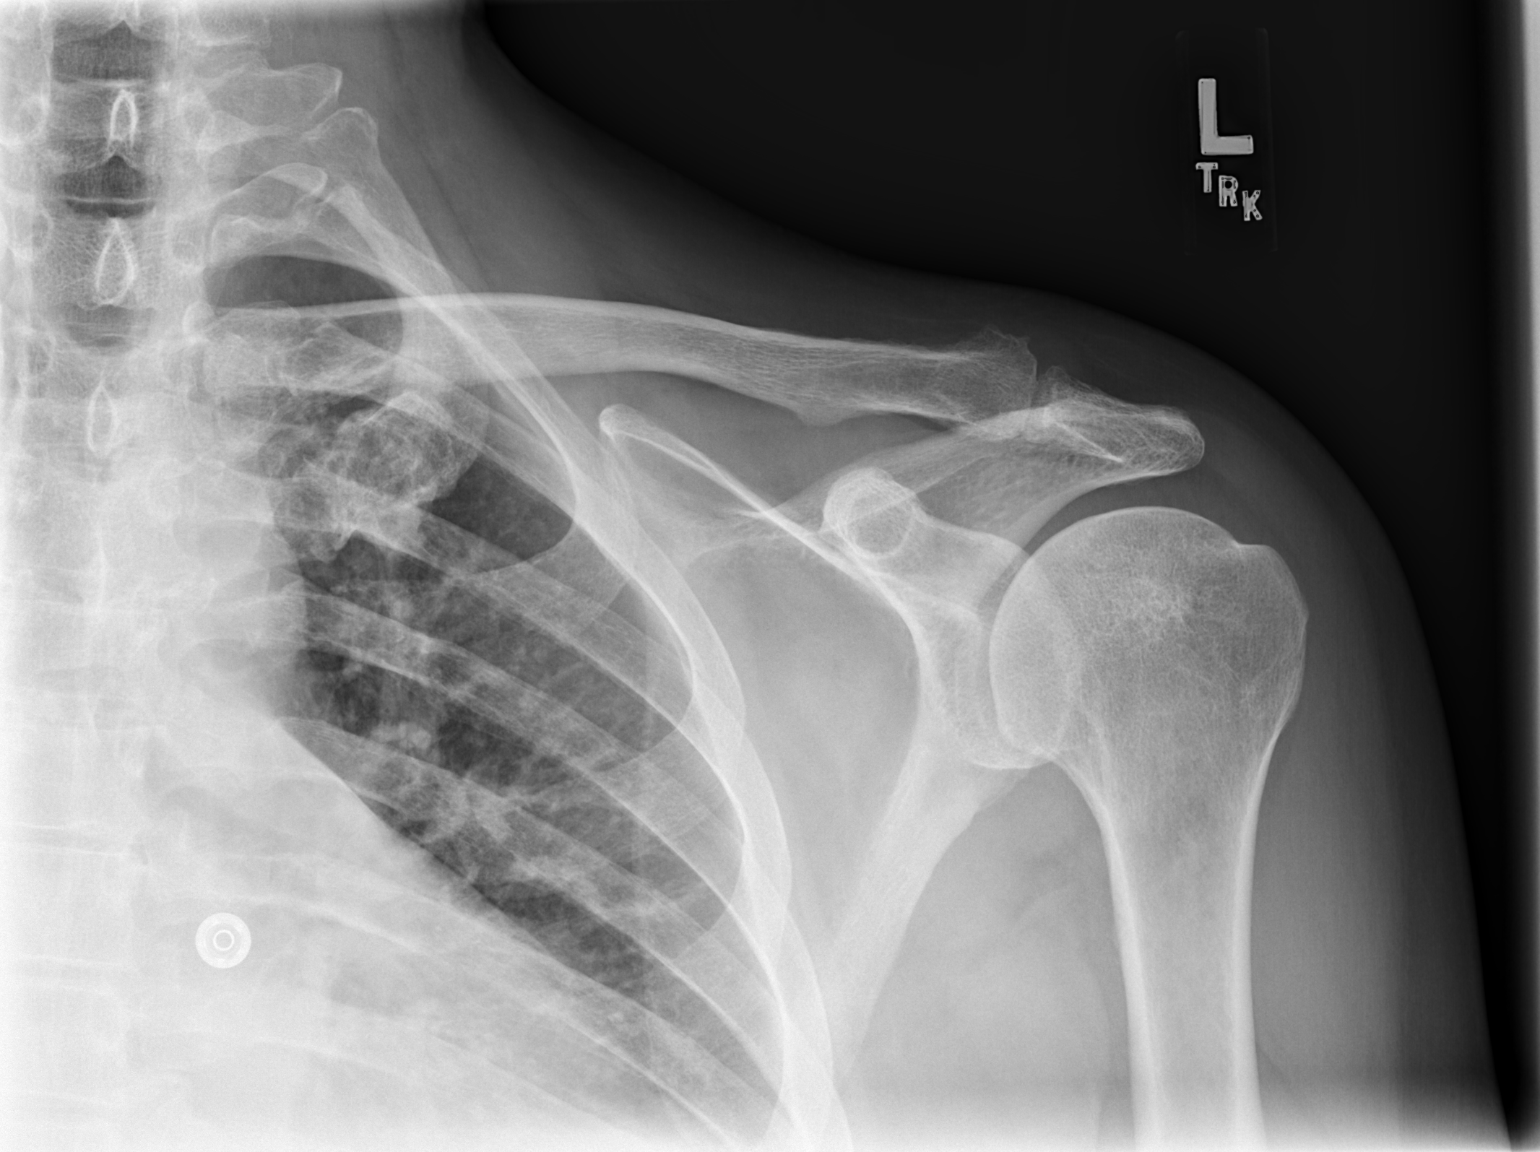

[w humerus ap left *]
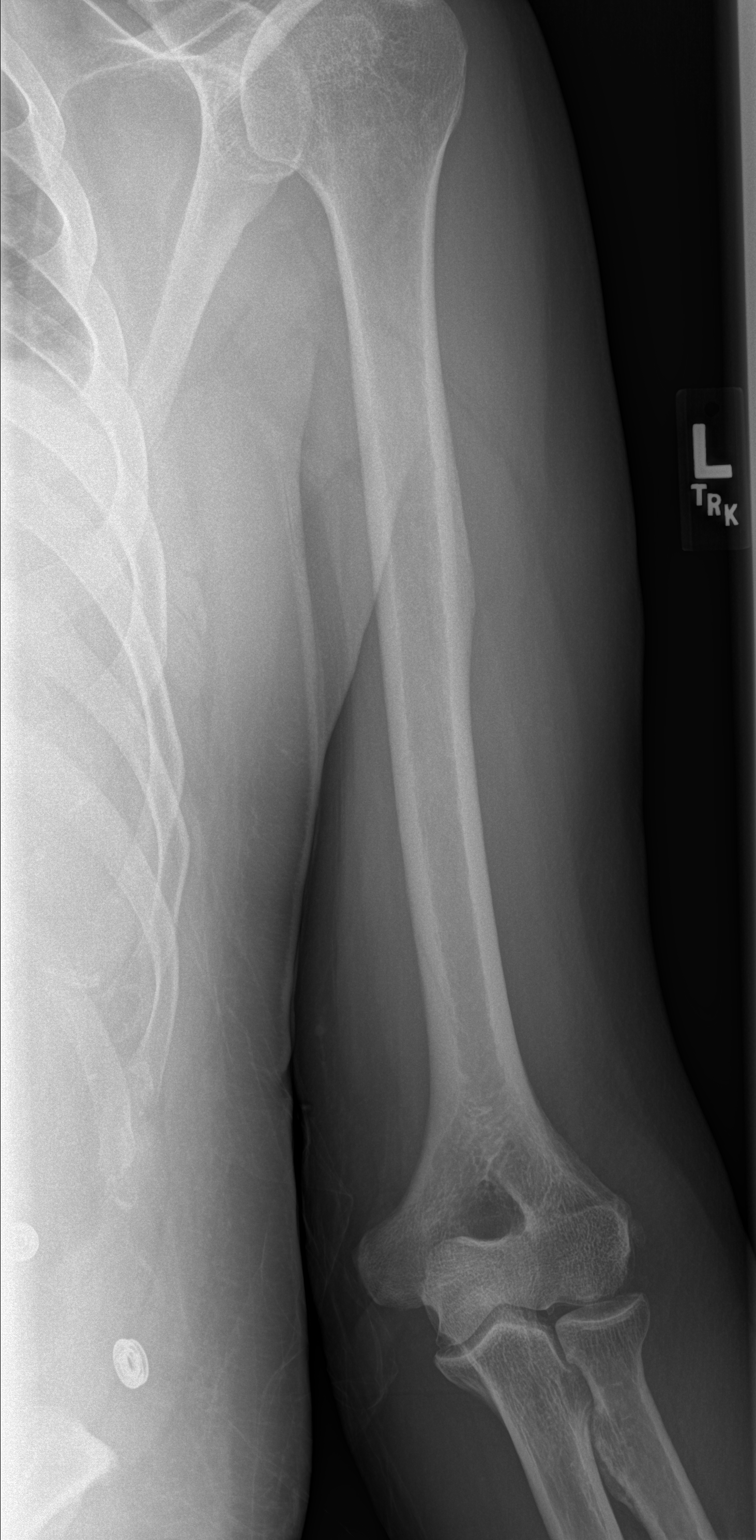

[w humerus lat left *]
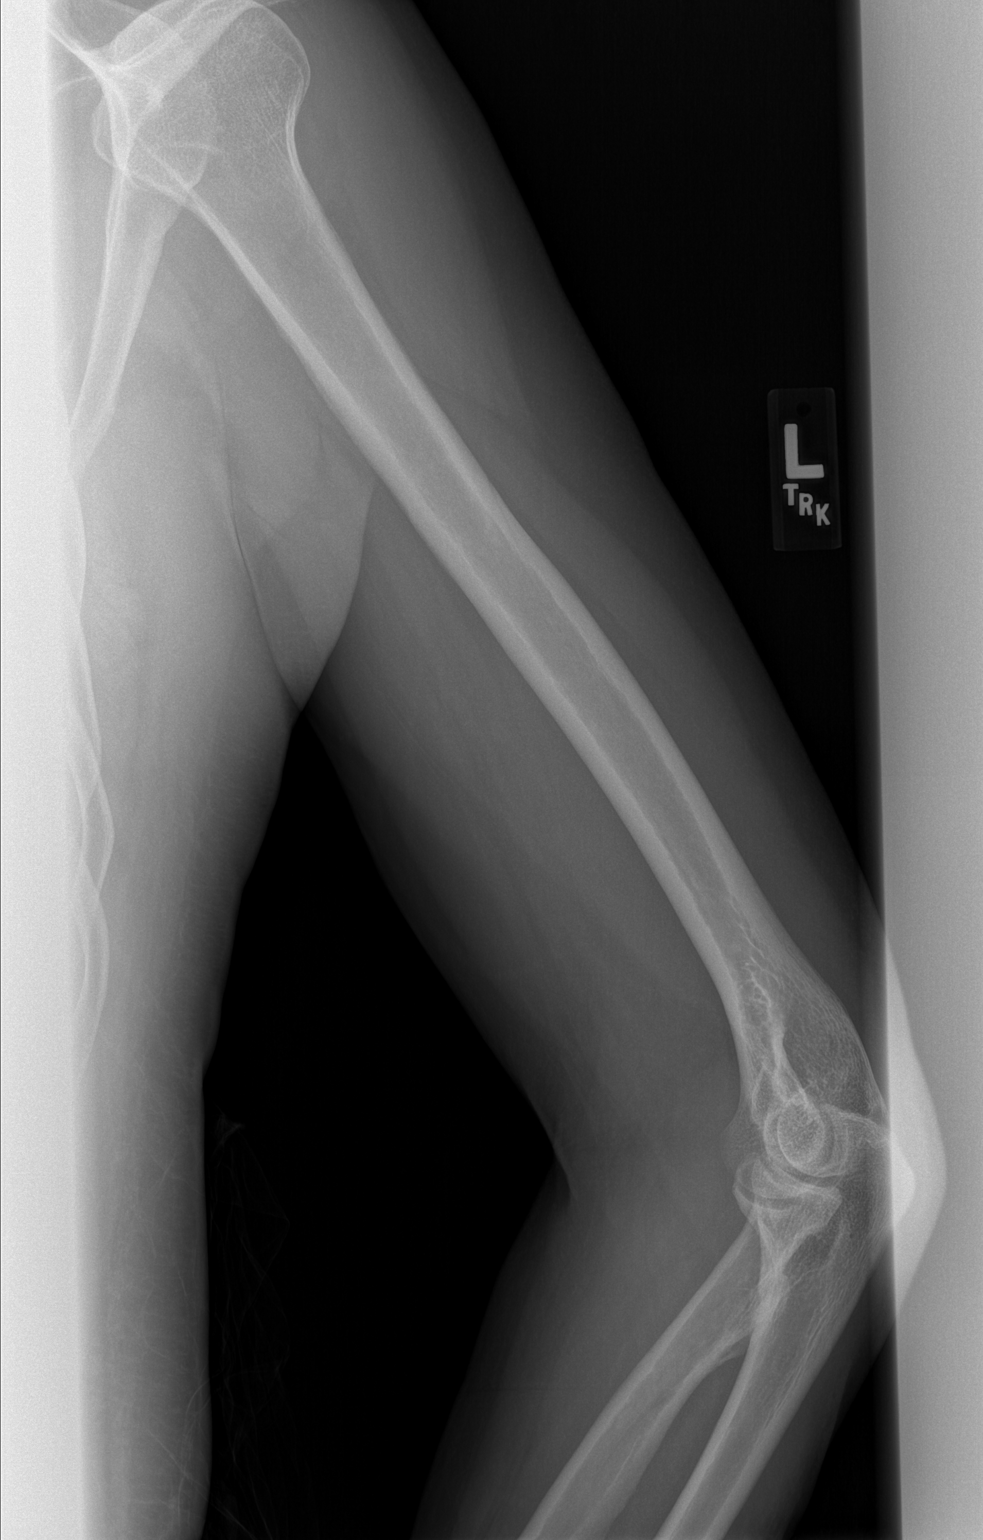

[3 of 3 positions shown; findings below may reference images not displayed]

FINDINGS: Three views submitted.  No acute fracture or subluxation.
The glenohumeral joint is unremarkable.
IMPRESSION: No left humerus acute fracture or subluxation.

## 2008-12-07 IMAGING — CR DG HUMERUS 2V *R*
3 series · 3 of 3 positions shown · non-contrast
Comparison: Scapular radiographs done concurrently.

CLINICAL DATA: Posterior shoulder pain.  No known injury.

RIGHT HUMERUS - 2+ VIEW

[t humerus ap right]
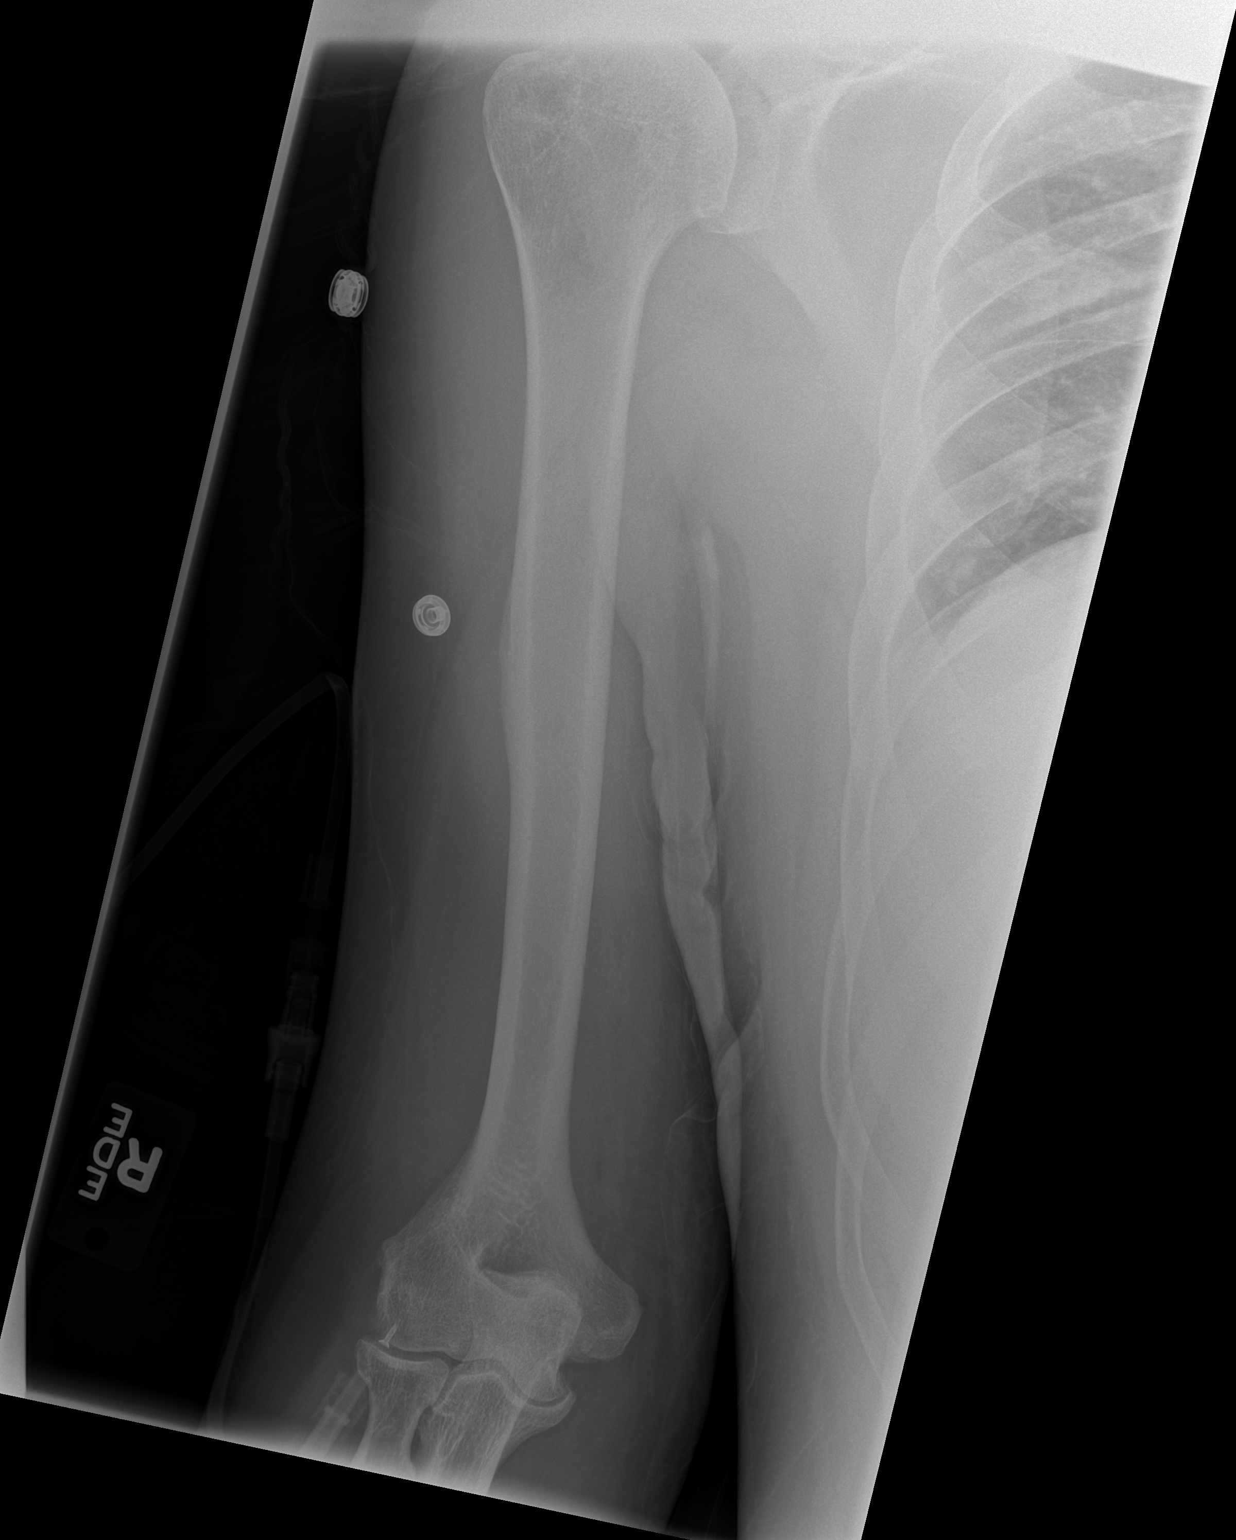

[t shoulder ap external righ]
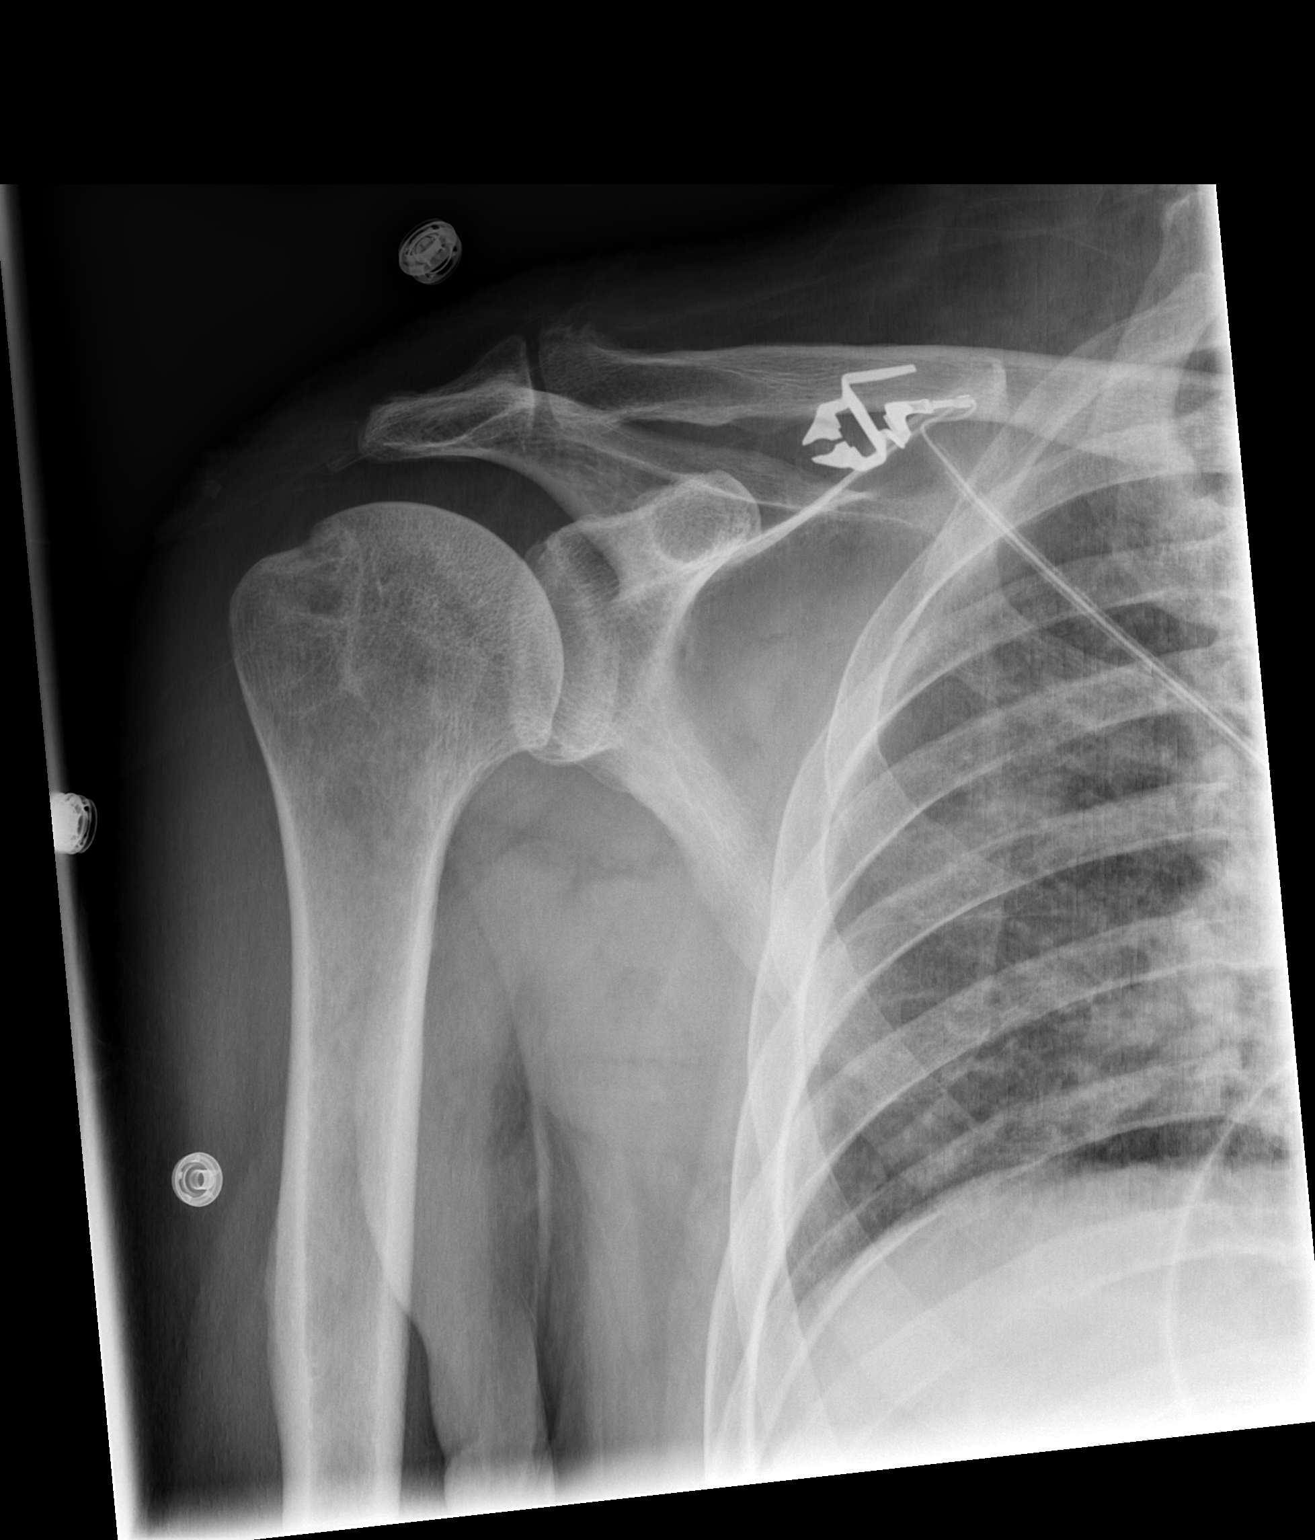

[t humerus lat right]
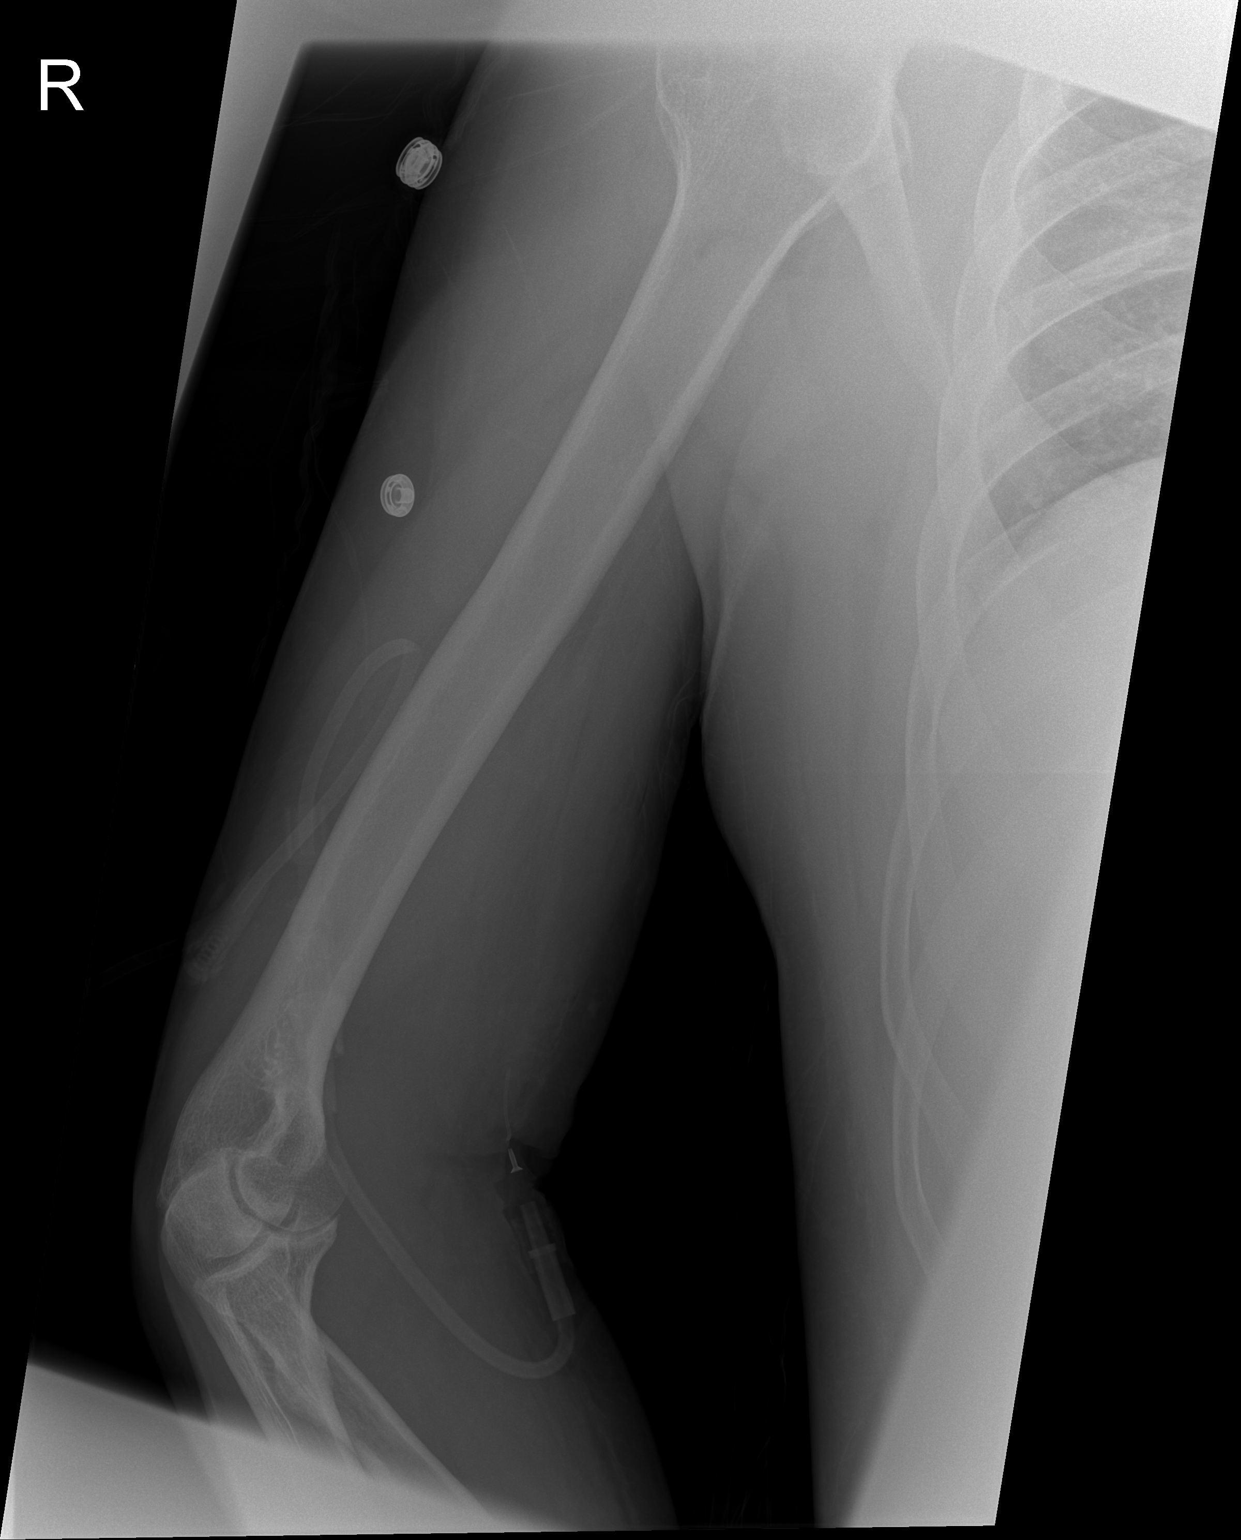

[3 of 3 positions shown; findings below may reference images not displayed]

FINDINGS: Mineralization and alignment are normal.  There is no
evidence of acute fracture or dislocation.  Cystic changes are
present in the humeral head at the greater tuberosity.  Mild
acromioclavicular degenerative changes are present.
IMPRESSION: No acute osseous findings.

## 2008-12-07 IMAGING — CR DG SCAPULA*R*
2 series · 2 of 2 positions shown · non-contrast
Comparison: Chest radiographs [DATE].

CLINICAL DATA: Posterior shoulder pain.  No known injury.

RIGHT SCAPULA

[t scapula ap/pa right]
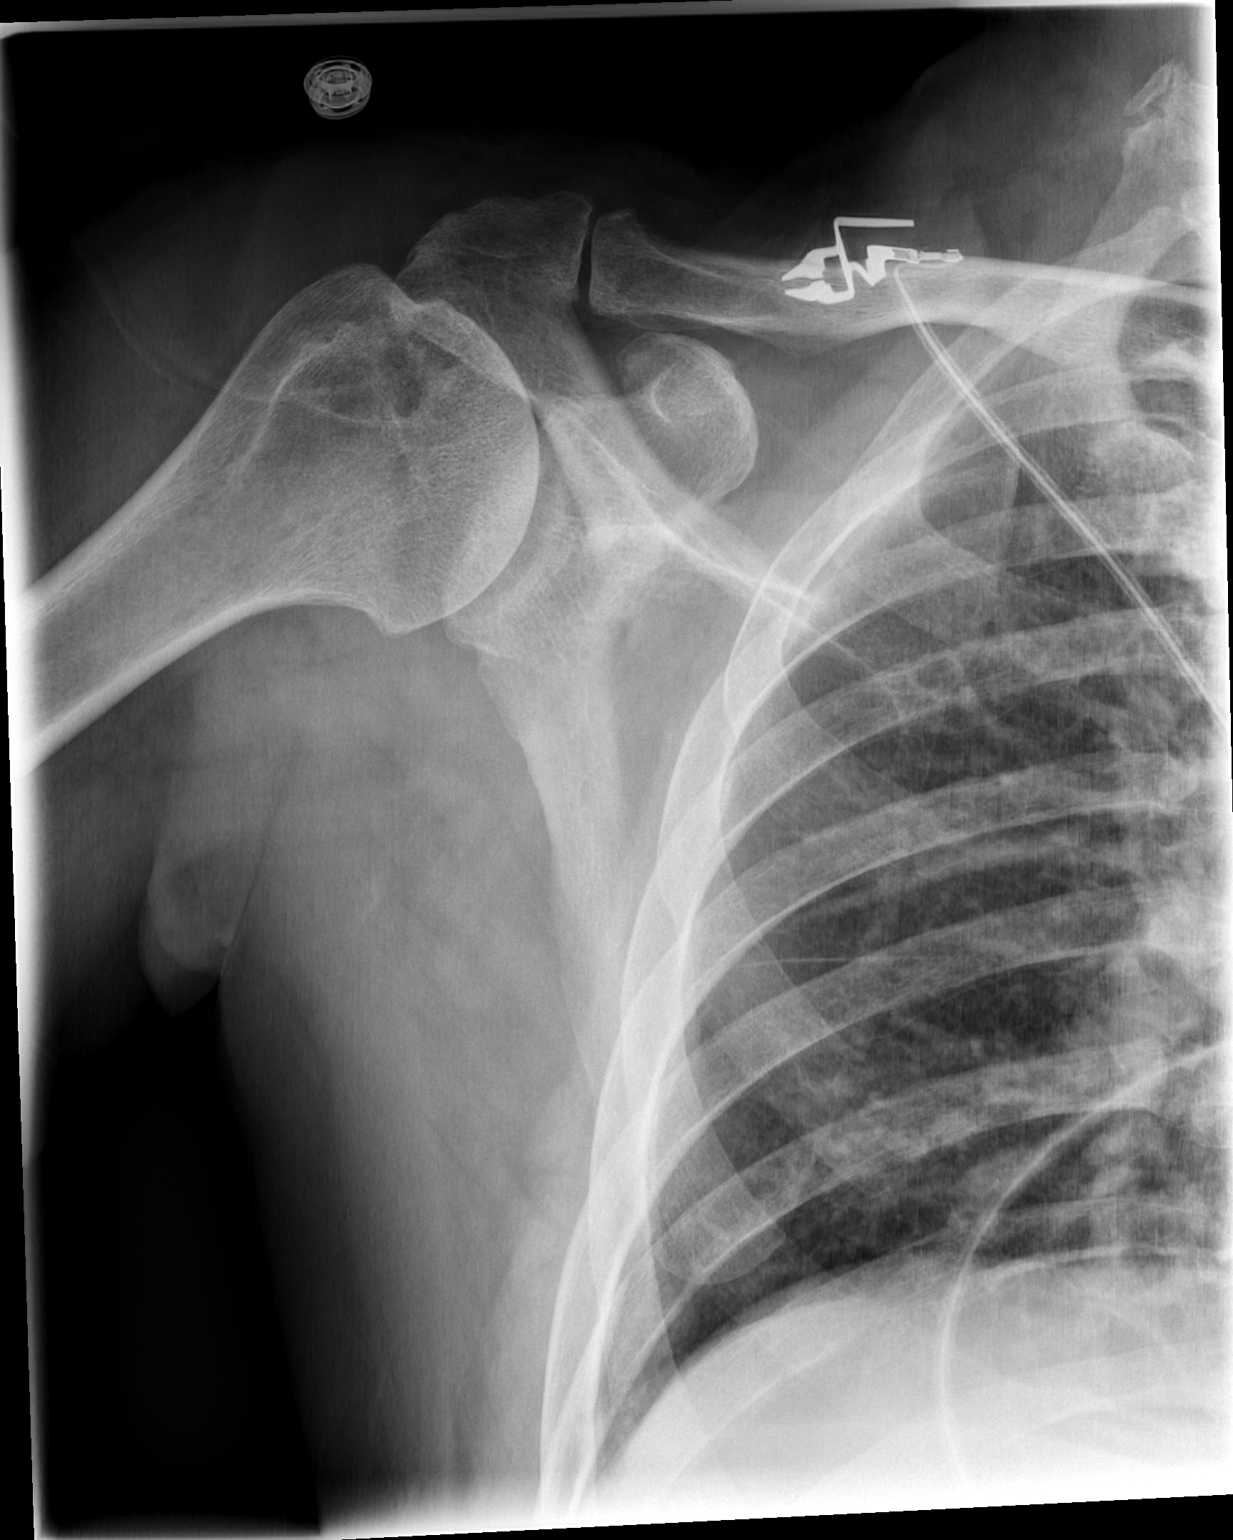

[t scapula lat. right]
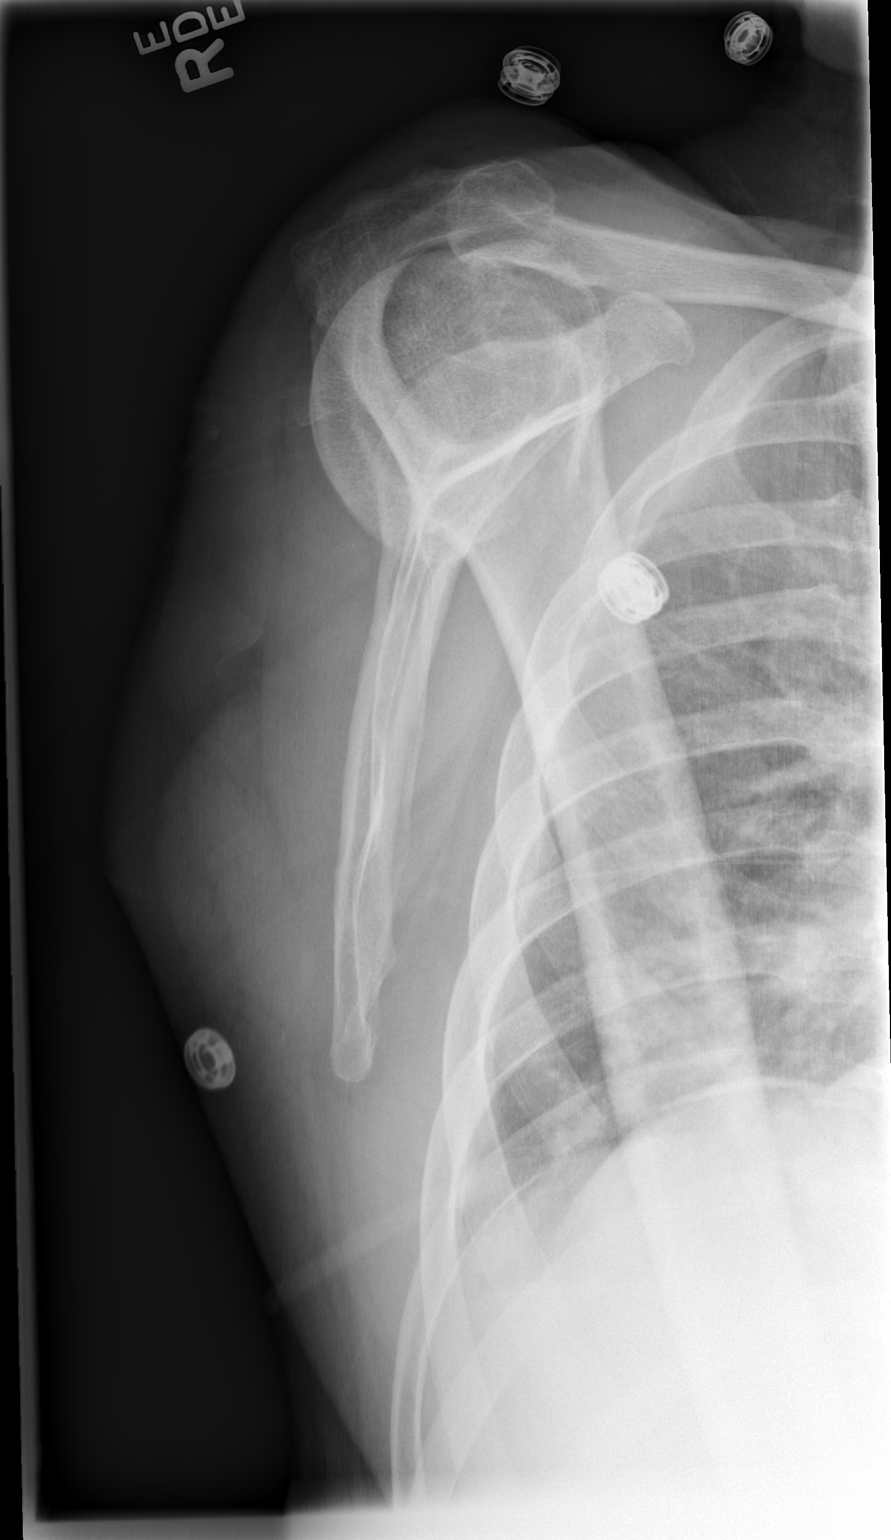

[2 of 2 positions shown; findings below may reference images not displayed]

FINDINGS: Mineralization and alignment are normal.  There is no
evidence of acute fracture, dislocation or bone destruction.  Mild
acromioclavicular degenerative changes are present.  There are
cystic changes in the humeral head near the greater tuberosity.
IMPRESSION: No acute osseous findings.

## 2008-12-07 IMAGING — CR DG SCAPULA*L*
2 series · 2 of 2 positions shown · non-contrast
Comparison: None

CLINICAL DATA: Left shoulder pain

LEFT SCAPULA

[w scapula ap/pa left]
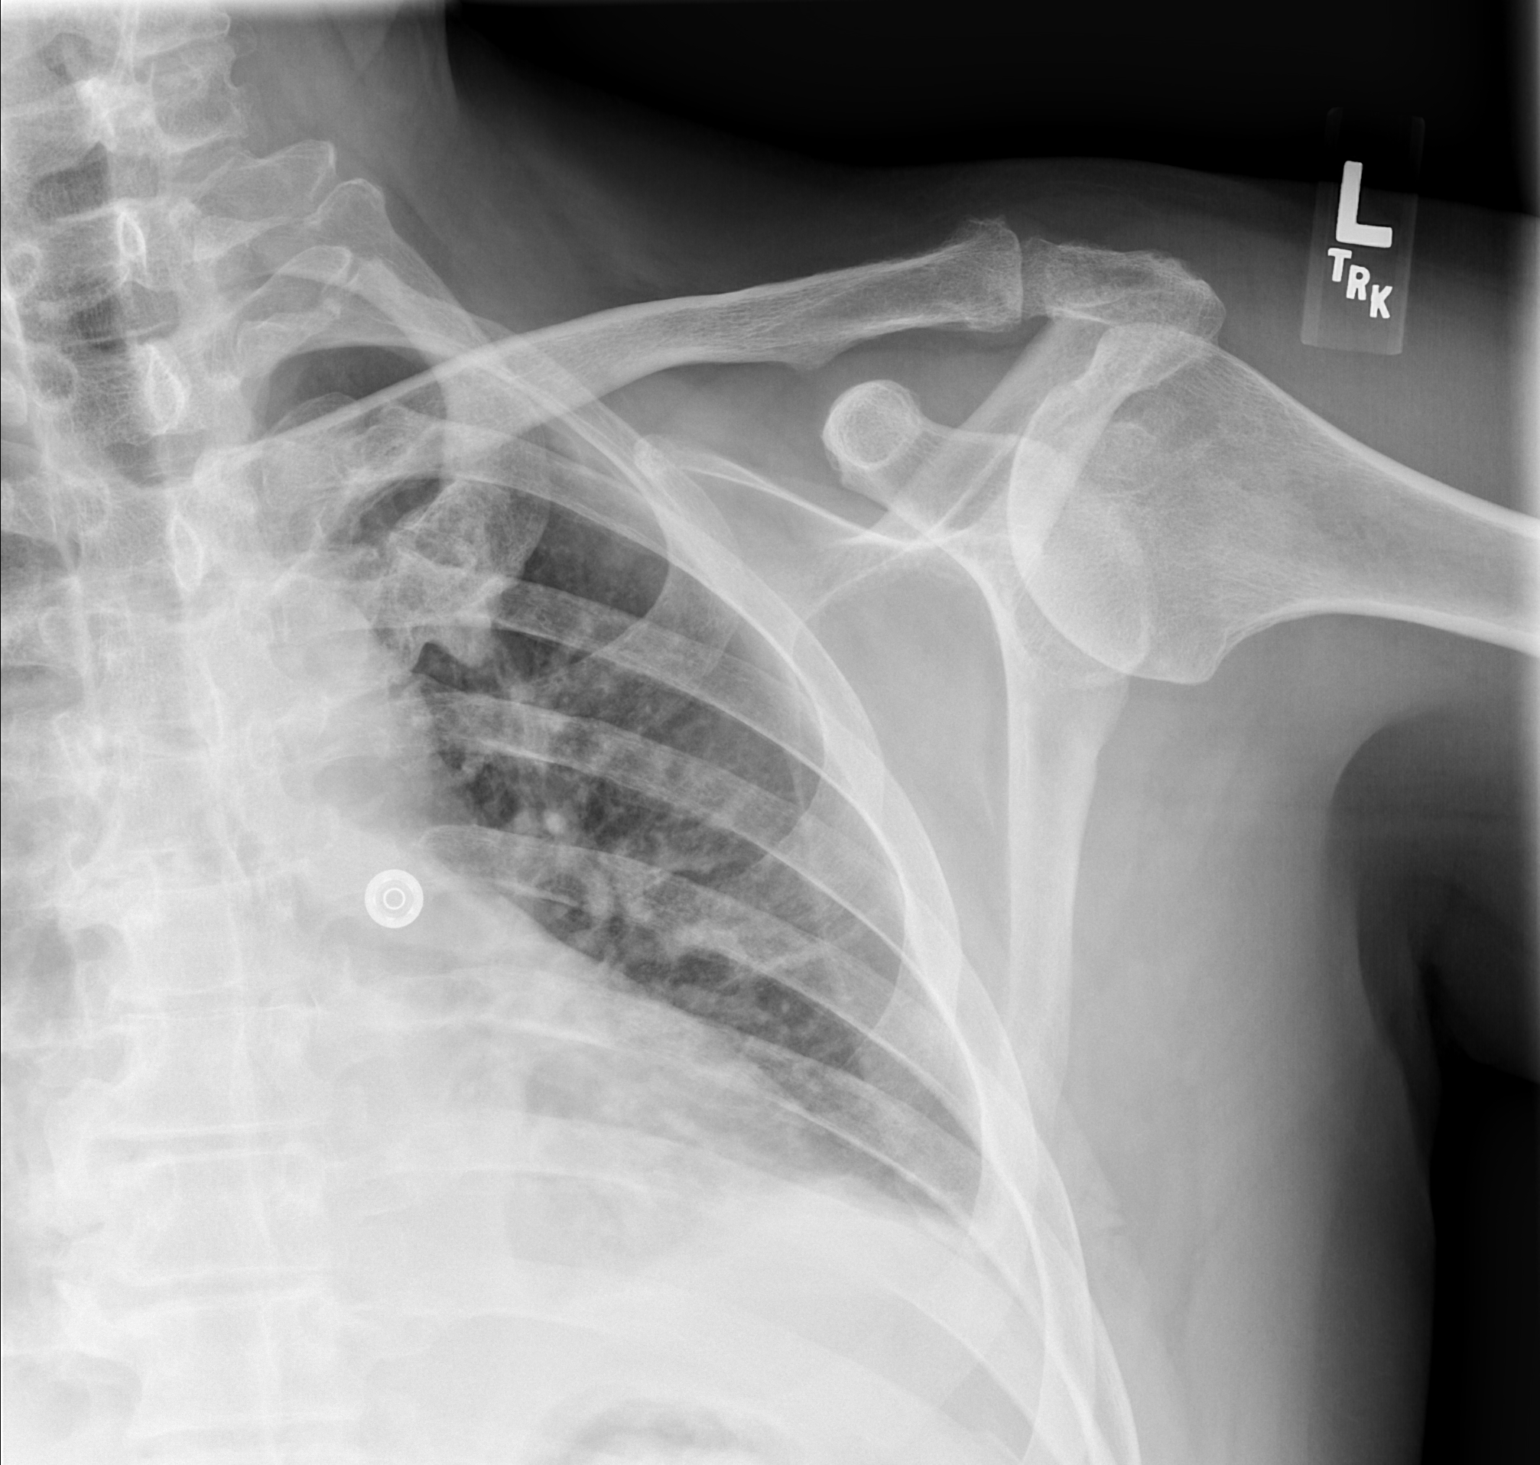

[w scapula lat left *]
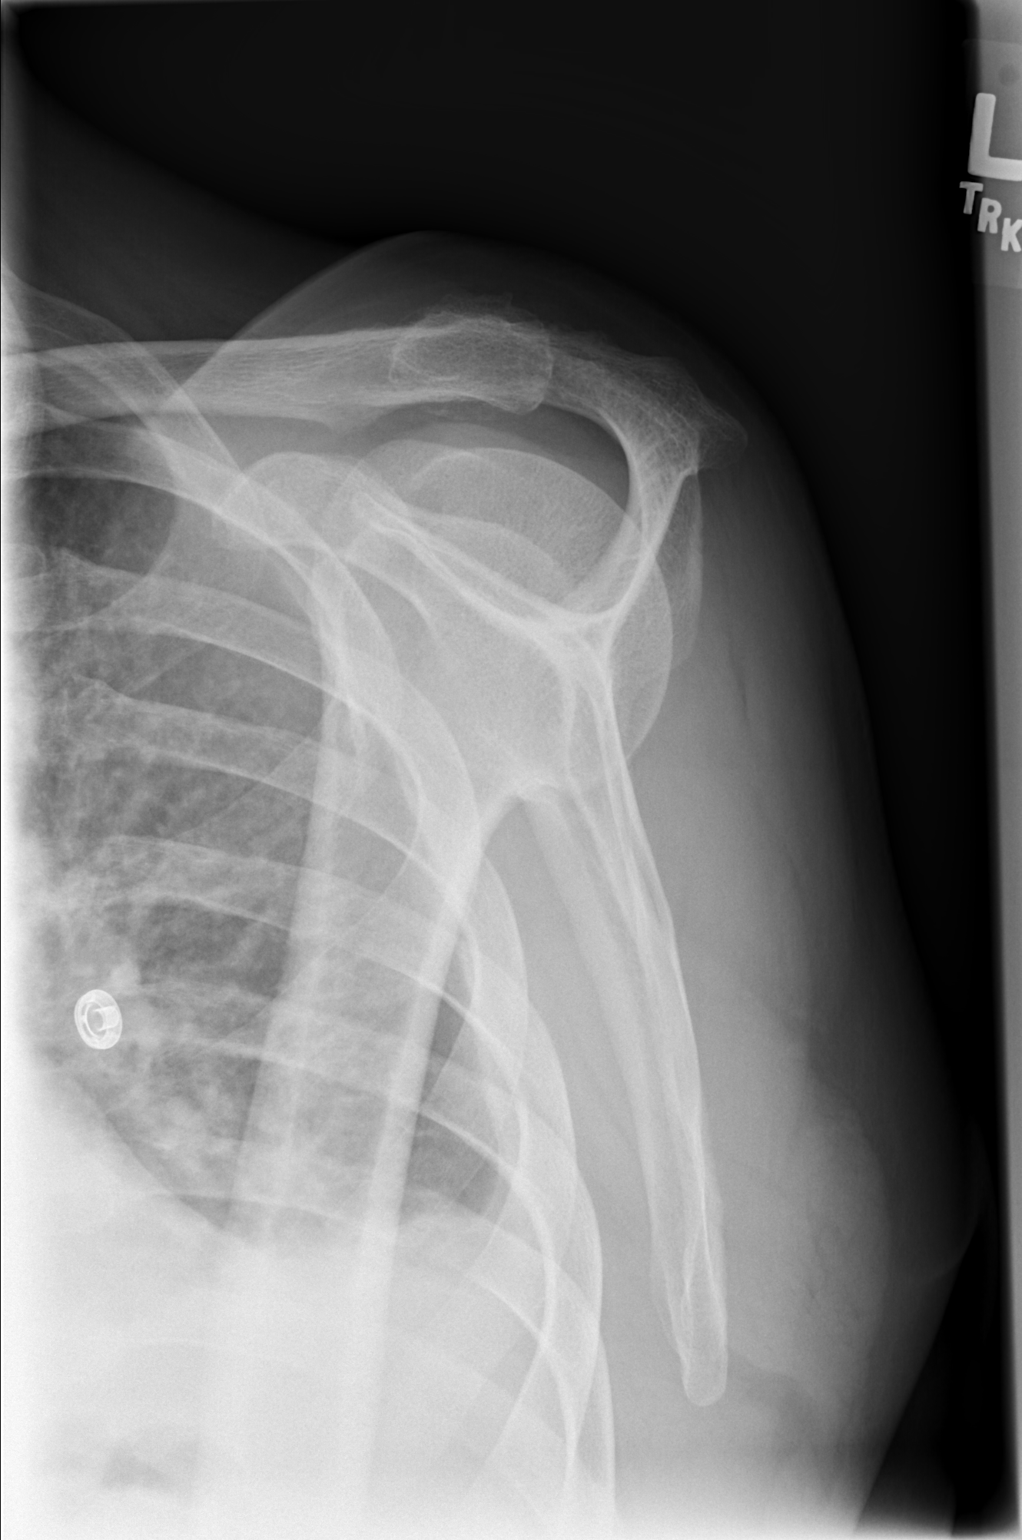

[2 of 2 positions shown; findings below may reference images not displayed]

FINDINGS: Two views submitted.  No acute fracture or subluxation.
Glenohumeral joint is unremarkable.  Mild degenerative changes left
AC joint.
IMPRESSION: No acute fracture or subluxation.  Mild degenerative changes left
AC joint.

## 2008-12-07 IMAGING — CR DG SHOULDER 2+V*L*
3 series · 3 of 3 positions shown · non-contrast
Comparison: None

CLINICAL DATA: Anterior pain, no known injury

LEFT SHOULDER - 2+ VIEW

[w shoulder ap external left]
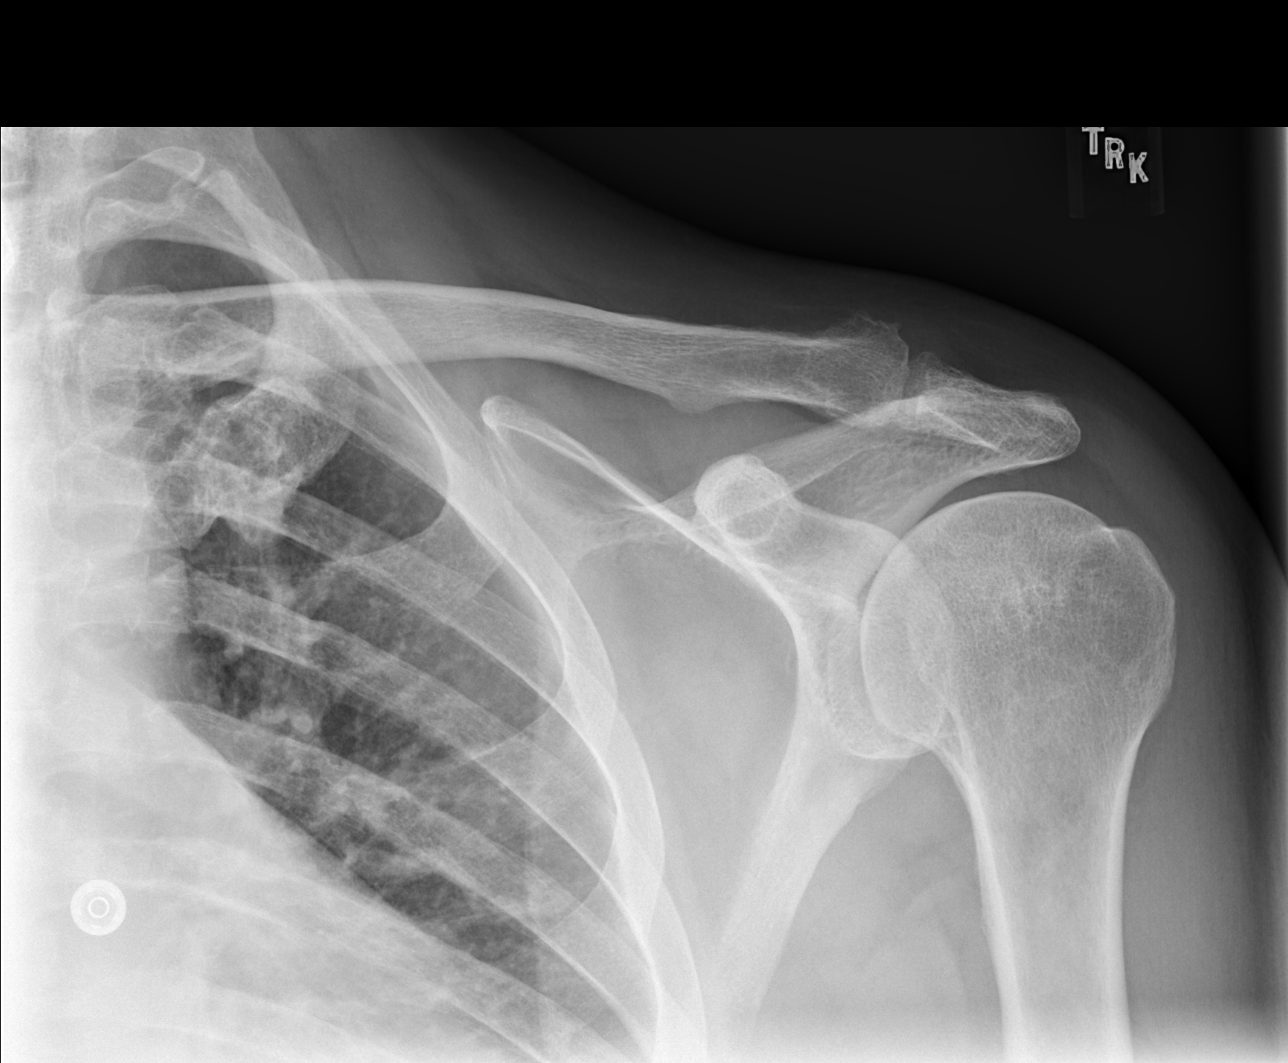

[w shoulder ap internal left]
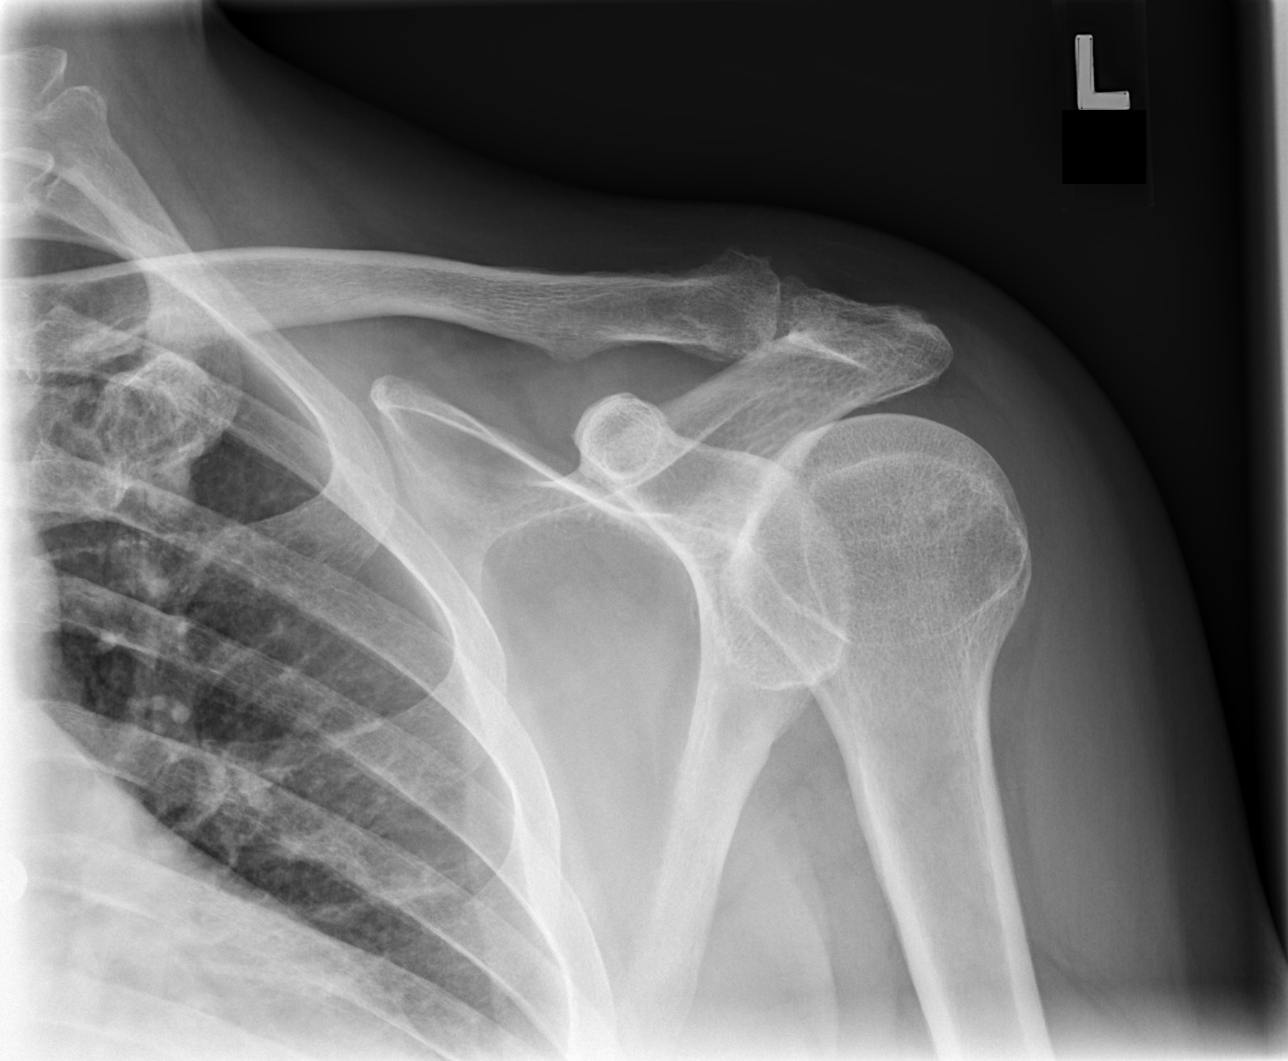

[x shoulder axillary left *]
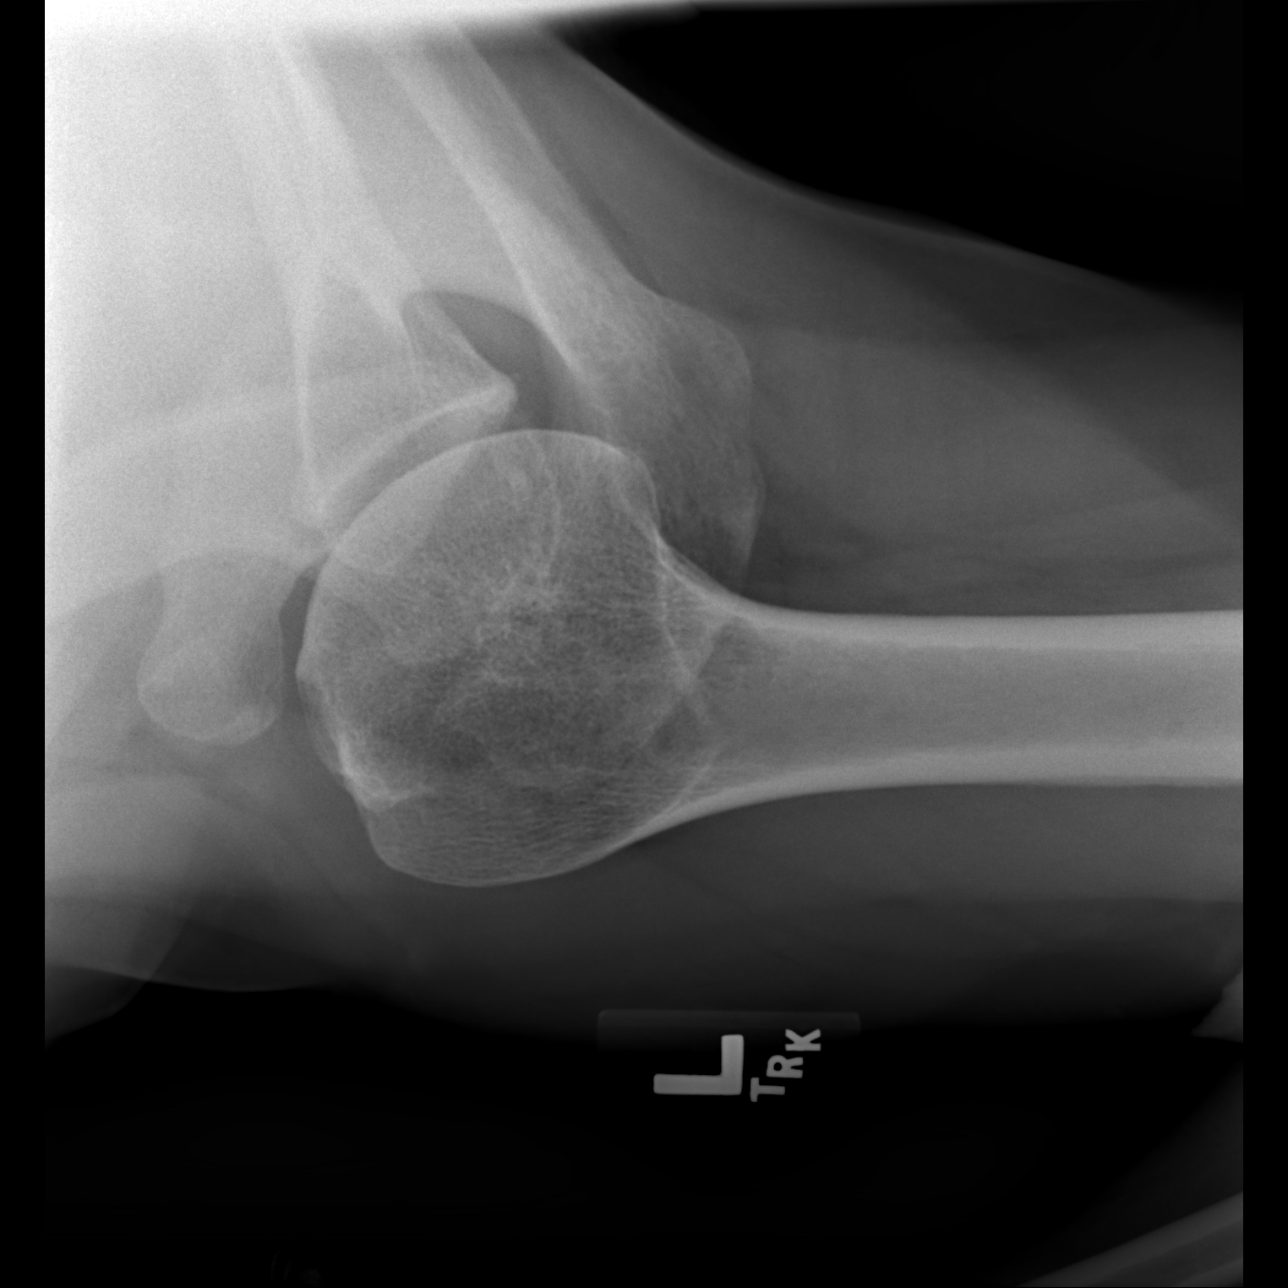

[3 of 3 positions shown; findings below may reference images not displayed]

FINDINGS: Three views submitted.  No acute fracture or subluxation.
Spurring of the distal left clavicle is noted.
IMPRESSION: No acute fracture or subluxation.  Spurring of distal left
clavicle.

## 2010-08-20 NOTE — Consult Note (Signed)
Summary: ABD x-ray & ABD Pain/[facility]  ABD x-ray & ABD Pain/[facility]   Imported By: Sherian Rein 12/20/2008 13:24:44  _____________________________________________________________________  External Attachment:    Type:   Image     Comment:   External Document

## 2010-08-20 NOTE — Letter (Signed)
Summary: [facility] consult/[facility]   Imported By: Lester Mankato 12/13/2008 10:01:31  _____________________________________________________________________  External Attachment:    Type:   Image     Comment:   External Document

## 2010-08-20 NOTE — Procedures (Signed)
Summary: Colonoscopy   Colonoscopy  Procedure date:  11/26/2008  Findings:      Location:  [facility].    COLONOSCOPY PROCEDURE REPORT  PATIENT:  Taylor, Eddie  MR#:  [id number] BIRTHDATE:   [birth year], 53 yrs. old   GENDER:   male  ENDOSCOPIST:   Barbette Hair. Arlyce Dice, MD Referred by: Claud Kelp, M.D.  PROCEDURE DATE:  11/26/2008 PROCEDURE:  Colonoscopy with decompression of volvulus ASA CLASS:   Class II INDICATIONS: decompression of megacolon Colonic stricture causing obstruction  MEDICATIONS:    Fentanyl 145 mcg IV, Versed 14.5 mg IV  DESCRIPTION OF PROCEDURE:   After the risks benefits and alternatives of the procedure were thoroughly explained, informed consent was obtained.  No rectal exam performed. The EC-[id number] ([id number]) and [id number] ([id number]) endoscope was introduced through the anus and advanced to the hepatic flexure, without limitations.  The quality of the prep was .  The instrument was then slowly withdrawn as the colon was fully examined. <<PROCEDUREIMAGES>>  <<OLD IMAGES>>  FINDINGS:  stenosis. Stenotic area measuring 3-4cm in length in proximal sigmoid at approximately 35cm from anus. Unable to pass colonoscope through area. A 10mm gastroscope was passed with moderate resistance into the descending colon up to the splenic flexure. Decompression was performed. A 0.36mm guidewire was placed floroscopically into the splenic flexure. After withdrawing the scope a 10Fr colon decompression tube was placed (see image002). The proximal end is in the area of the splenic flexure  Moderate diverticulosis was found in the sigmoid colon (see image001). Moderate diverticular disease distal to stenotic area  This was otherwise a normal examination of the colon.   Retroflexion was not performed.  The scope was then withdrawn from the patient and the procedure completed.  COMPLICATIONS:   None  ENDOSCOPIC IMPRESSION:  1) Stenosis -Benign colonic stricture secondary to  diverticular disease; s/p placement of colon decompression tube  2) Moderate diverticulosis in the sigmoid colon  3) Otherwise normal examination RECOMMENDATIONS:  followup K[facility] in 24hrs  REPEAT EXAM:   No   _______________________________ Barbette Hair. Arlyce Dice, MD  CC:  This report was created from the original endoscopy report, which was reviewed and signed by the above listed endoscopist.

## 2010-10-29 LAB — CROSSMATCH
ABO/RH(D): O POS
ABO/RH(D): O POS
Antibody Screen: NEGATIVE
Antibody Screen: NEGATIVE

## 2010-10-29 LAB — LIPID PANEL
Cholesterol: 214 mg/dL — ABNORMAL HIGH (ref 0–200)
HDL: 39 mg/dL — ABNORMAL LOW (ref >39)
LDL Cholesterol: 132 mg/dL — ABNORMAL HIGH (ref 0–99)
Total CHOL/HDL Ratio: 5.5 RATIO
Triglycerides: 216 mg/dL — ABNORMAL HIGH (ref <150)
VLDL: 43 mg/dL — ABNORMAL HIGH (ref 0–40)

## 2010-10-29 LAB — DIFFERENTIAL
Basophils Absolute: 0 10*3/uL (ref 0.0–0.1)
Basophils Absolute: 0 10*3/uL (ref 0.0–0.1)
Basophils Relative: 0 % (ref 0–1)
Basophils Relative: 0 % (ref 0–1)
Eosinophils Absolute: 0.3 10*3/uL (ref 0.0–0.7)
Eosinophils Absolute: 0.1 10*3/uL (ref 0.0–0.7)
Eosinophils Relative: 4 % (ref 0–5)
Eosinophils Relative: 0 % (ref 0–5)
Lymphocytes Relative: 14 % (ref 12–46)
Lymphocytes Relative: 13 % (ref 12–46)
Lymphs Abs: 1 10*3/uL (ref 0.7–4.0)
Lymphs Abs: 1.5 10*3/uL (ref 0.7–4.0)
Monocytes Absolute: 0.9 10*3/uL (ref 0.1–1.0)
Monocytes Absolute: 0.8 10*3/uL (ref 0.1–1.0)
Monocytes Relative: 12 % (ref 3–12)
Monocytes Relative: 7 % (ref 3–12)
Neutro Abs: 5.2 10*3/uL (ref 1.7–7.7)
Neutro Abs: 9.3 10*3/uL — ABNORMAL HIGH (ref 1.7–7.7)
Neutrophils Relative %: 70 % (ref 43–77)
Neutrophils Relative %: 80 % — ABNORMAL HIGH (ref 43–77)

## 2010-10-29 LAB — MAGNESIUM
Magnesium: 1.9 mg/dL (ref 1.5–2.5)
Magnesium: 1.9 mg/dL (ref 1.5–2.5)
Magnesium: 2 mg/dL (ref 1.5–2.5)
Magnesium: 2 mg/dL (ref 1.5–2.5)
Magnesium: 2.3 mg/dL (ref 1.5–2.5)
Magnesium: 2.4 mg/dL (ref 1.5–2.5)
Magnesium: 2.4 mg/dL (ref 1.5–2.5)

## 2010-10-29 LAB — BASIC METABOLIC PANEL
BUN: 3 mg/dL — ABNORMAL LOW (ref 6–23)
BUN: 3 mg/dL — ABNORMAL LOW (ref 6–23)
BUN: 3 mg/dL — ABNORMAL LOW (ref 6–23)
BUN: 3 mg/dL — ABNORMAL LOW (ref 6–23)
BUN: 5 mg/dL — ABNORMAL LOW (ref 6–23)
BUN: 11 mg/dL (ref 6–23)
BUN: 4 mg/dL — ABNORMAL LOW (ref 6–23)
BUN: 4 mg/dL — ABNORMAL LOW (ref 6–23)
BUN: 5 mg/dL — ABNORMAL LOW (ref 6–23)
BUN: 9 mg/dL (ref 6–23)
BUN: 9 mg/dL (ref 6–23)
CO2: 25 mEq/L (ref 19–32)
CO2: 28 mEq/L (ref 19–32)
CO2: 28 mEq/L (ref 19–32)
CO2: 29 mEq/L (ref 19–32)
CO2: 29 mEq/L (ref 19–32)
CO2: 25 mEq/L (ref 19–32)
CO2: 26 mEq/L (ref 19–32)
CO2: 26 mEq/L (ref 19–32)
CO2: 28 mEq/L (ref 19–32)
CO2: 28 mEq/L (ref 19–32)
CO2: 28 mEq/L (ref 19–32)
Calcium: 7.5 mg/dL — ABNORMAL LOW (ref 8.4–10.5)
Calcium: 7.9 mg/dL — ABNORMAL LOW (ref 8.4–10.5)
Calcium: 8.1 mg/dL — ABNORMAL LOW (ref 8.4–10.5)
Calcium: 8.1 mg/dL — ABNORMAL LOW (ref 8.4–10.5)
Calcium: 8.2 mg/dL — ABNORMAL LOW (ref 8.4–10.5)
Calcium: 7.2 mg/dL — ABNORMAL LOW (ref 8.4–10.5)
Calcium: 7.2 mg/dL — ABNORMAL LOW (ref 8.4–10.5)
Calcium: 7.4 mg/dL — ABNORMAL LOW (ref 8.4–10.5)
Calcium: 7.7 mg/dL — ABNORMAL LOW (ref 8.4–10.5)
Calcium: 7.8 mg/dL — ABNORMAL LOW (ref 8.4–10.5)
Calcium: 8.8 mg/dL (ref 8.4–10.5)
Chloride: 100 mEq/L (ref 96–112)
Chloride: 103 mEq/L (ref 96–112)
Chloride: 105 mEq/L (ref 96–112)
Chloride: 109 mEq/L (ref 96–112)
Chloride: 98 mEq/L (ref 96–112)
Chloride: 100 mEq/L (ref 96–112)
Chloride: 104 mEq/L (ref 96–112)
Chloride: 106 mEq/L (ref 96–112)
Chloride: 111 mEq/L (ref 96–112)
Chloride: 113 mEq/L — ABNORMAL HIGH (ref 96–112)
Chloride: 115 mEq/L — ABNORMAL HIGH (ref 96–112)
Creatinine, Ser: 0.59 mg/dL (ref 0.4–1.5)
Creatinine, Ser: 0.66 mg/dL (ref 0.4–1.5)
Creatinine, Ser: 0.69 mg/dL (ref 0.4–1.5)
Creatinine, Ser: 0.75 mg/dL (ref 0.4–1.5)
Creatinine, Ser: 0.82 mg/dL (ref 0.4–1.5)
Creatinine, Ser: 0.65 mg/dL (ref 0.4–1.5)
Creatinine, Ser: 0.66 mg/dL (ref 0.4–1.5)
Creatinine, Ser: 0.71 mg/dL (ref 0.4–1.5)
Creatinine, Ser: 0.8 mg/dL (ref 0.4–1.5)
Creatinine, Ser: 0.85 mg/dL (ref 0.4–1.5)
Creatinine, Ser: 0.89 mg/dL (ref 0.4–1.5)
GFR calc Af Amer: >60 mL/min (ref >60)
GFR calc Af Amer: >60 mL/min (ref >60)
GFR calc Af Amer: >60 mL/min (ref >60)
GFR calc Af Amer: >60 mL/min (ref >60)
GFR calc Af Amer: >60 mL/min (ref >60)
GFR calc Af Amer: >60 mL/min (ref >60)
GFR calc Af Amer: >60 mL/min (ref >60)
GFR calc Af Amer: >60 mL/min (ref >60)
GFR calc Af Amer: >60 mL/min (ref >60)
GFR calc Af Amer: >60 mL/min (ref >60)
GFR calc Af Amer: >60 mL/min (ref >60)
GFR calc non Af Amer: >60 mL/min (ref >60)
GFR calc non Af Amer: >60 mL/min (ref >60)
GFR calc non Af Amer: >60 mL/min (ref >60)
GFR calc non Af Amer: >60 mL/min (ref >60)
GFR calc non Af Amer: >60 mL/min (ref >60)
GFR calc non Af Amer: >60 mL/min (ref >60)
GFR calc non Af Amer: >60 mL/min (ref >60)
GFR calc non Af Amer: >60 mL/min (ref >60)
GFR calc non Af Amer: >60 mL/min (ref >60)
GFR calc non Af Amer: >60 mL/min (ref >60)
GFR calc non Af Amer: >60 mL/min (ref >60)
Glucose, Bld: 100 mg/dL — ABNORMAL HIGH (ref 70–99)
Glucose, Bld: 101 mg/dL — ABNORMAL HIGH (ref 70–99)
Glucose, Bld: 94 mg/dL (ref 70–99)
Glucose, Bld: 96 mg/dL (ref 70–99)
Glucose, Bld: 98 mg/dL (ref 70–99)
Glucose, Bld: 105 mg/dL — ABNORMAL HIGH (ref 70–99)
Glucose, Bld: 107 mg/dL — ABNORMAL HIGH (ref 70–99)
Glucose, Bld: 109 mg/dL — ABNORMAL HIGH (ref 70–99)
Glucose, Bld: 121 mg/dL — ABNORMAL HIGH (ref 70–99)
Glucose, Bld: 156 mg/dL — ABNORMAL HIGH (ref 70–99)
Glucose, Bld: 95 mg/dL (ref 70–99)
Potassium: 3.2 mEq/L — ABNORMAL LOW (ref 3.5–5.1)
Potassium: 3.6 mEq/L (ref 3.5–5.1)
Potassium: 3.7 mEq/L (ref 3.5–5.1)
Potassium: 3.8 mEq/L (ref 3.5–5.1)
Potassium: 3.8 mEq/L (ref 3.5–5.1)
Potassium: 3.3 mEq/L — ABNORMAL LOW (ref 3.5–5.1)
Potassium: 3.5 mEq/L (ref 3.5–5.1)
Potassium: 3.6 mEq/L (ref 3.5–5.1)
Potassium: 3.9 mEq/L (ref 3.5–5.1)
Potassium: 4.4 mEq/L (ref 3.5–5.1)
Potassium: 4.8 mEq/L (ref 3.5–5.1)
Sodium: 135 mEq/L (ref 135–145)
Sodium: 136 mEq/L (ref 135–145)
Sodium: 137 mEq/L (ref 135–145)
Sodium: 139 mEq/L (ref 135–145)
Sodium: 141 mEq/L (ref 135–145)
Sodium: 136 mEq/L (ref 135–145)
Sodium: 136 mEq/L (ref 135–145)
Sodium: 141 mEq/L (ref 135–145)
Sodium: 142 mEq/L (ref 135–145)
Sodium: 146 mEq/L — ABNORMAL HIGH (ref 135–145)
Sodium: 147 mEq/L — ABNORMAL HIGH (ref 135–145)

## 2010-10-29 LAB — COMPREHENSIVE METABOLIC PANEL
ALT: 25 U/L (ref 0–53)
ALT: 27 U/L (ref 0–53)
AST: 25 U/L (ref 0–37)
AST: 25 U/L (ref 0–37)
Albumin: 3.6 g/dL (ref 3.5–5.2)
Albumin: 4 g/dL (ref 3.5–5.2)
Alkaline Phosphatase: 55 U/L (ref 39–117)
Alkaline Phosphatase: 63 U/L (ref 39–117)
BUN: 5 mg/dL — ABNORMAL LOW (ref 6–23)
BUN: 7 mg/dL (ref 6–23)
CO2: 24 mEq/L (ref 19–32)
CO2: 27 mEq/L (ref 19–32)
Calcium: 8.9 mg/dL (ref 8.4–10.5)
Calcium: 9.2 mg/dL (ref 8.4–10.5)
Chloride: 101 mEq/L (ref 96–112)
Chloride: 103 mEq/L (ref 96–112)
Creatinine, Ser: 0.84 mg/dL (ref 0.4–1.5)
Creatinine, Ser: 0.93 mg/dL (ref 0.4–1.5)
GFR calc Af Amer: >60 mL/min (ref >60)
GFR calc Af Amer: >60 mL/min (ref >60)
GFR calc non Af Amer: >60 mL/min (ref >60)
GFR calc non Af Amer: >60 mL/min (ref >60)
Glucose, Bld: 123 mg/dL — ABNORMAL HIGH (ref 70–99)
Glucose, Bld: 135 mg/dL — ABNORMAL HIGH (ref 70–99)
Potassium: 3.3 mEq/L — ABNORMAL LOW (ref 3.5–5.1)
Potassium: 4.6 mEq/L (ref 3.5–5.1)
Sodium: 135 mEq/L (ref 135–145)
Sodium: 137 mEq/L (ref 135–145)
Total Bilirubin: 1 mg/dL (ref 0.3–1.2)
Total Bilirubin: 1.1 mg/dL (ref 0.3–1.2)
Total Protein: 6.9 g/dL (ref 6.0–8.3)
Total Protein: 7.4 g/dL (ref 6.0–8.3)

## 2010-10-29 LAB — CBC
HCT: 25.2 % — ABNORMAL LOW (ref 39.0–52.0)
HCT: 25.2 % — ABNORMAL LOW (ref 39.0–52.0)
HCT: 27 % — ABNORMAL LOW (ref 39.0–52.0)
HCT: 19.4 % — ABNORMAL LOW (ref 39.0–52.0)
HCT: 19.5 % — ABNORMAL LOW (ref 39.0–52.0)
HCT: 22.9 % — ABNORMAL LOW (ref 39.0–52.0)
HCT: 25 % — ABNORMAL LOW (ref 39.0–52.0)
HCT: 26.6 % — ABNORMAL LOW (ref 39.0–52.0)
HCT: 28.1 % — ABNORMAL LOW (ref 39.0–52.0)
HCT: 37.9 % — ABNORMAL LOW (ref 39.0–52.0)
HCT: 38.3 % — ABNORMAL LOW (ref 39.0–52.0)
HCT: 42.6 % (ref 39.0–52.0)
Hemoglobin: 8.6 g/dL — ABNORMAL LOW (ref 13.0–17.0)
Hemoglobin: 8.9 g/dL — ABNORMAL LOW (ref 13.0–17.0)
Hemoglobin: 9.3 g/dL — ABNORMAL LOW (ref 13.0–17.0)
Hemoglobin: 13.4 g/dL (ref 13.0–17.0)
Hemoglobin: 13.5 g/dL (ref 13.0–17.0)
Hemoglobin: 15.1 g/dL (ref 13.0–17.0)
Hemoglobin: 6.8 g/dL — CL (ref 13.0–17.0)
Hemoglobin: 6.8 g/dL — CL (ref 13.0–17.0)
Hemoglobin: 8 g/dL — ABNORMAL LOW (ref 13.0–17.0)
Hemoglobin: 8.6 g/dL — ABNORMAL LOW (ref 13.0–17.0)
Hemoglobin: 9.4 g/dL — ABNORMAL LOW (ref 13.0–17.0)
Hemoglobin: 9.9 g/dL — ABNORMAL LOW (ref 13.0–17.0)
MCHC: 34.3 g/dL (ref 30.0–36.0)
MCHC: 34.4 g/dL (ref 30.0–36.0)
MCHC: 35.1 g/dL (ref 30.0–36.0)
MCHC: 34.6 g/dL (ref 30.0–36.0)
MCHC: 34.8 g/dL (ref 30.0–36.0)
MCHC: 34.9 g/dL (ref 30.0–36.0)
MCHC: 35 g/dL (ref 30.0–36.0)
MCHC: 35.1 g/dL (ref 30.0–36.0)
MCHC: 35.1 g/dL (ref 30.0–36.0)
MCHC: 35.2 g/dL (ref 30.0–36.0)
MCHC: 35.4 g/dL (ref 30.0–36.0)
MCHC: 35.5 g/dL (ref 30.0–36.0)
MCV: 96.8 fL (ref 78.0–100.0)
MCV: 98.1 fL (ref 78.0–100.0)
MCV: 98.7 fL (ref 78.0–100.0)
MCV: 100.7 fL — ABNORMAL HIGH (ref 78.0–100.0)
MCV: 101 fL — ABNORMAL HIGH (ref 78.0–100.0)
MCV: 101 fL — ABNORMAL HIGH (ref 78.0–100.0)
MCV: 101.3 fL — ABNORMAL HIGH (ref 78.0–100.0)
MCV: 101.3 fL — ABNORMAL HIGH (ref 78.0–100.0)
MCV: 98.3 fL (ref 78.0–100.0)
MCV: 98.9 fL (ref 78.0–100.0)
MCV: 99.2 fL (ref 78.0–100.0)
MCV: 99.9 fL (ref 78.0–100.0)
Platelets: 295 10*3/uL (ref 150–400)
Platelets: 320 10*3/uL (ref 150–400)
Platelets: 351 10*3/uL (ref 150–400)
Platelets: 169 10*3/uL (ref 150–400)
Platelets: 184 10*3/uL (ref 150–400)
Platelets: 189 10*3/uL (ref 150–400)
Platelets: 204 10*3/uL (ref 150–400)
Platelets: 209 10*3/uL (ref 150–400)
Platelets: 233 10*3/uL (ref 150–400)
Platelets: 237 10*3/uL (ref 150–400)
Platelets: 248 10*3/uL (ref 150–400)
Platelets: 251 10*3/uL (ref 150–400)
RBC: 2.55 MIL/uL — ABNORMAL LOW (ref 4.22–5.81)
RBC: 2.57 MIL/uL — ABNORMAL LOW (ref 4.22–5.81)
RBC: 2.79 MIL/uL — ABNORMAL LOW (ref 4.22–5.81)
RBC: 1.92 MIL/uL — ABNORMAL LOW (ref 4.22–5.81)
RBC: 1.93 MIL/uL — ABNORMAL LOW (ref 4.22–5.81)
RBC: 2.31 MIL/uL — ABNORMAL LOW (ref 4.22–5.81)
RBC: 2.52 MIL/uL — ABNORMAL LOW (ref 4.22–5.81)
RBC: 2.71 MIL/uL — ABNORMAL LOW (ref 4.22–5.81)
RBC: 2.77 MIL/uL — ABNORMAL LOW (ref 4.22–5.81)
RBC: 3.76 MIL/uL — ABNORMAL LOW (ref 4.22–5.81)
RBC: 3.78 MIL/uL — ABNORMAL LOW (ref 4.22–5.81)
RBC: 4.26 MIL/uL (ref 4.22–5.81)
RDW: 15.3 % (ref 11.5–15.5)
RDW: 16 % — ABNORMAL HIGH (ref 11.5–15.5)
RDW: 16.5 % — ABNORMAL HIGH (ref 11.5–15.5)
RDW: 14.5 % (ref 11.5–15.5)
RDW: 14.5 % (ref 11.5–15.5)
RDW: 14.5 % (ref 11.5–15.5)
RDW: 14.6 % (ref 11.5–15.5)
RDW: 15 % (ref 11.5–15.5)
RDW: 15 % (ref 11.5–15.5)
RDW: 15.6 % — ABNORMAL HIGH (ref 11.5–15.5)
RDW: 16.1 % — ABNORMAL HIGH (ref 11.5–15.5)
RDW: 16.7 % — ABNORMAL HIGH (ref 11.5–15.5)
WBC: 7.4 10*3/uL (ref 4.0–10.5)
WBC: 7.5 10*3/uL (ref 4.0–10.5)
WBC: 7.5 10*3/uL (ref 4.0–10.5)
WBC: 10.2 10*3/uL (ref 4.0–10.5)
WBC: 11.7 10*3/uL — ABNORMAL HIGH (ref 4.0–10.5)
WBC: 6.5 10*3/uL (ref 4.0–10.5)
WBC: 7.6 10*3/uL (ref 4.0–10.5)
WBC: 8.1 10*3/uL (ref 4.0–10.5)
WBC: 8.1 10*3/uL (ref 4.0–10.5)
WBC: 8.8 10*3/uL (ref 4.0–10.5)
WBC: 8.9 10*3/uL (ref 4.0–10.5)
WBC: 9 10*3/uL (ref 4.0–10.5)

## 2010-10-29 LAB — TYPE AND SCREEN
ABO/RH(D): O POS
Antibody Screen: NEGATIVE

## 2010-10-29 LAB — GLUCOSE, CAPILLARY
Glucose-Capillary: 102 mg/dL — ABNORMAL HIGH (ref 70–99)
Glucose-Capillary: 102 mg/dL — ABNORMAL HIGH (ref 70–99)
Glucose-Capillary: 103 mg/dL — ABNORMAL HIGH (ref 70–99)
Glucose-Capillary: 89 mg/dL (ref 70–99)
Glucose-Capillary: 93 mg/dL (ref 70–99)
Glucose-Capillary: 100 mg/dL — ABNORMAL HIGH (ref 70–99)
Glucose-Capillary: 101 mg/dL — ABNORMAL HIGH (ref 70–99)
Glucose-Capillary: 105 mg/dL — ABNORMAL HIGH (ref 70–99)
Glucose-Capillary: 107 mg/dL — ABNORMAL HIGH (ref 70–99)
Glucose-Capillary: 108 mg/dL — ABNORMAL HIGH (ref 70–99)
Glucose-Capillary: 119 mg/dL — ABNORMAL HIGH (ref 70–99)
Glucose-Capillary: 133 mg/dL — ABNORMAL HIGH (ref 70–99)
Glucose-Capillary: 225 mg/dL — ABNORMAL HIGH (ref 70–99)
Glucose-Capillary: 80 mg/dL (ref 70–99)
Glucose-Capillary: 81 mg/dL (ref 70–99)
Glucose-Capillary: 82 mg/dL (ref 70–99)
Glucose-Capillary: 83 mg/dL (ref 70–99)
Glucose-Capillary: 83 mg/dL (ref 70–99)
Glucose-Capillary: 83 mg/dL (ref 70–99)
Glucose-Capillary: 86 mg/dL (ref 70–99)
Glucose-Capillary: 87 mg/dL (ref 70–99)
Glucose-Capillary: 88 mg/dL (ref 70–99)
Glucose-Capillary: 89 mg/dL (ref 70–99)
Glucose-Capillary: 94 mg/dL (ref 70–99)
Glucose-Capillary: 94 mg/dL (ref 70–99)
Glucose-Capillary: 95 mg/dL (ref 70–99)
Glucose-Capillary: 97 mg/dL (ref 70–99)

## 2010-10-29 LAB — PHOSPHORUS
Phosphorus: 2.7 mg/dL (ref 2.3–4.6)
Phosphorus: 4.1 mg/dL (ref 2.3–4.6)
Phosphorus: 4.2 mg/dL (ref 2.3–4.6)
Phosphorus: 4.3 mg/dL (ref 2.3–4.6)
Phosphorus: 1.5 mg/dL — ABNORMAL LOW (ref 2.3–4.6)
Phosphorus: 2.5 mg/dL (ref 2.3–4.6)
Phosphorus: 3.1 mg/dL (ref 2.3–4.6)

## 2010-10-29 LAB — HEMOCCULT GUIAC POC 1CARD (OFFICE): Fecal Occult Bld: NEGATIVE

## 2010-10-29 LAB — ABO/RH: ABO/RH(D): O POS

## 2010-10-29 LAB — BLOOD GAS, ARTERIAL
Acid-Base Excess: 4 mmol/L — ABNORMAL HIGH (ref 0.0–2.0)
Bicarbonate: 27.4 mEq/L — ABNORMAL HIGH (ref 20.0–24.0)
FIO2: 0.28 %
O2 Saturation: 96.8 %
Patient temperature: 102.8
TCO2: 28.6 mmol/L (ref 0–100)
pCO2 arterial: 41.9 mmHg (ref 35.0–45.0)
pH, Arterial: 7.443 (ref 7.350–7.450)
pO2, Arterial: 86.9 mmHg (ref 80.0–100.0)

## 2010-10-29 LAB — CORTISOL: Cortisol, Plasma: 16 ug/dL

## 2010-10-29 LAB — PROTIME-INR
INR: 1.1 (ref 0.00–1.49)
Prothrombin Time: 14 seconds (ref 11.6–15.2)

## 2010-10-29 LAB — PREPARE RBC (CROSSMATCH)

## 2010-10-29 LAB — APTT: aPTT: 29 seconds (ref 24–37)

## 2010-10-29 LAB — TSH: TSH: 1.933 u[IU]/mL (ref 0.350–4.500)

## 2010-10-29 LAB — OSMOLALITY, URINE: Osmolality, Ur: 286 mOsm/kg — ABNORMAL LOW (ref 390–1090)

## 2010-10-29 LAB — LACTIC ACID, PLASMA
Lactic Acid, Venous: 0.6 mmol/L (ref 0.5–2.2)
Lactic Acid, Venous: 0.9 mmol/L (ref 0.5–2.2)

## 2010-10-29 LAB — SODIUM, URINE, RANDOM: Sodium, Ur: 88 mEq/L

## 2010-10-29 LAB — AMMONIA: Ammonia: 17 umol/L (ref 11–35)

## 2010-10-29 LAB — LIPASE, BLOOD: Lipase: 14 U/L (ref 11–59)

## 2010-12-03 NOTE — Op Note (Signed)
Eddie Taylor, Eddie Taylor                 ACCOUNT NO.:  [id number]   MEDICAL RECORD NO.:  [id number]          PATIENT TYPE:  INP   LOCATION:  5525                         FACILITY:  [facility]   PHYSICIAN:  Adolph Pollack, M.D.DATE OF BIRTH:  May 31, 1956   DATE OF PROCEDURE:  11/27/2008  DATE OF DISCHARGE:                               OPERATIVE REPORT   PREOPERATIVE DIAGNOSIS:  Large bowel obstruction.   POSTOPERATIVE DIAGNOSES:  1. Large bowel obstruction.  2. Ischemic right colitis.   PROCEDURES:  1. Total abdominal colectomy.  2. Partial omentectomy.  3. Ileostomy.   SURGEON:  Adolph Pollack, MD   ASSISTANT:  Almond Lint, MD   ANESTHESIA:  General.   INDICATIONS:  This 55 year old male was admitted on Nov 24, 2008, with a  large bowel obstruction.  The colonoscopy performed did demonstrate a  stricture, but not necessarily a mass.  He subsequently had a rectal  tube placed above the stricture in hopes of decompression, but today, he  still has significant abdominal distention.  He is having pain and is  essentially no better.  He now presents for the above procedure.  We  have discussed the procedure and risks preoperatively.   TECHNIQUE:  He is brought to the operating room, placed supine on the  operating table, and general anesthetic was administered.  A Foley  catheter was inserted as well as a nasogastric tube.  The abdominal wall  hair was clipped and the area sterilely prepped and draped.  A long  midline incision was made dividing the skin, subcutaneous tissue,  fascia, and peritoneum entering the peritoneal cavity were bloody  ascites was noted and evacuated.  I noted that immediately, there was  the distended right colon with a serosal muscular layer tears with  mucosa exposed, which was ischemic appearing.  This was not going to be  viable and this would not allow for just a segmental colectomy.  The  transverse colon was somewhat dusky as well.  Thus, I decided  he would  need a total abdominal colectomy.  I noted the dilated small bowel as  well.   Down in the left lower quadrant near the sigmoid colon, there was a  stricture present.  It was fairly densely adherent and inflammatory in  nature to the sidewall.  I began by mobilizing the left colon proximal  to the stricture by dividing its lateral attachments.  Then, once I  approached the stricture and the inflammatory change close to it, I then  stayed close to the colon.  Using electrocautery and blunt dissection,  freed this off the sidewall.  I identified the left ureter and kept it  posterior to the plane of dissection.  I was able to mobilize the  sigmoid colon further dissecting it free from the inflammatory tissue  until I got to the rectosigmoid junction which is soft and normal.  I  divided the colon here with a GIA stapler and marked the rectal stump  with two 2-0 Prolene sutures.   I then divided the mesentery close to the colon up to  the splenic  flexure using the LigaSure.   Following this, I mobilized the right colon by dividing its lateral  attachments.  I divided the intestine at the terminal ileum just  proximal to the ileocecal valve with the GIA stapler.  I dissected the  omentum off the transverse colon.  I then began dividing the right  mesocolon using the LigaSure device as well as ligating the ileocolic  and right colic vessels.  Some bleeding was noted in the mesocolon and  this was controlled with clips and direct pressure.   I then approached the splenic flexure using electrocautery and the  LigaSure device.  I mobilized the splenic flexure without injuring the  spleen.  I dissected part of the omentum free from the transverse colon.  I then divided the mesocolon close to the colon with a LigaSure device,  thus giving me my specimen and this was handed off the field and sent to  Pathology as total abdominal colon.   Following this, I noted that the part of the  omentum needed to be  removed, and this was done using the LigaSure device and sent with the  specimen for a partial omentectomy.  I did not see any liver lesions.  I  did not see any pelvic lesions to be consistent with a neoplasm.   I then examined the left upper quadrant regions, right lower quadrant  region, left lower quadrant region, and right upper quadrant.  Hemostasis was adequate.  I copiously irrigated out the abdominal cavity  with saline solution and evacuated.   Following this, I picked a point at the right lower quadrant and made a  circular skin incision.  I then made a cruciate incision in the fascial  layers and brought up the ileal stump through this layer.  I did anchor  some of the ileum to the posterior fascia with interrupted 3-0 Vicryl  sutures.  I then requested a sponge, which was reported to be correct.   The fascia was then closed with a running #1 PDS suture with  intermittent retention sutures of number #2 nylon used.  The  subcutaneous tissue was irrigated and the skin was closed with staples.  The midline wound was then protected with a towel.  I then matured the  ileostomy in a Brooke type fashion with interrupted 3-0 Vicryl sutures.  I placed the stoma appliance on this.  The sterile dressing was placed  across the midline wound.   He tolerated the procedure well without any apparent complications and  was taken to recovery in satisfactory condition.      Adolph Pollack, M.D.  Electronically Signed     TJR/MEDQ  D:  11/27/2008  T:  11/28/2008  Job:  [id number]

## 2010-12-03 NOTE — Discharge Summary (Signed)
NAMECERVANDO, DURNIN                 ACCOUNT NO.:  [id number]   MEDICAL RECORD NO.:  [id number]          PATIENT TYPE:  INP   LOCATION:  2006                         FACILITY:  [facility]   PHYSICIAN:  Herbie Saxon, MDDATE OF BIRTH:  [birth year]   DATE OF ADMISSION:  11/24/2008  DATE OF DISCHARGE:  12/08/2008                               DISCHARGE SUMMARY   ADDENDUM   DISCHARGE DIAGNOSES:  Remains the same.  In addition, the patient  developed:  1. Altered mental status which has resolved.  2. Acute delirium.  3. Hypotension, resolved.  4. Protein-calorie malnutrition.  5. Anemia, improved status post 1 unit packed red blood cell.  6. Depression.  7. Hypokalemia.  8. Hypophosphatemia, repleted.  9. History of rheumatoid arthritis.   CONSULTS:  1. Dr. Jeanie Sewer, Psychiatry, on Dec 05, 2008.  2. Pulmonary Critical Care, Dr. Tyson Alias.   RADIOLOGY:  X-ray of the left scapula shows mild degenerative changes to  the left AC joint; right scapula, no acute findings; right humerus, no  acute findings.  X-ray of the right humerus showed spurring of the  distal left clavicle.  Chest x-ray on Dec 02, 2008, stable bibasilar  hypoaeration.  Abdominal x-ray on Nov 29, 2008, nonspecific ileus.  Chest x-ray Nov 30, 2008, improved aeration of the lungs, minimal  atelectasis.   HOSPITAL COURSE:  The patient developed severe delirium on Nov 29, 2008,  very combative and breaking restraints, transferred to the intensive  care unit.  Alcohol withdrawal was suspected.  Hypokalemia was repleted,  was started on stress dose steroids with adrenal insufficiency, started  on IV Haldol p.r.n.  He was noted to develop postop ileus as on Nov 30, 2008, kept n.p.o.  Bowel function did resume spontaneously within 24-48  hours.  Pathology report status post  bowels suggested that he had  diverticulitis with abscess, and surgeon stopped the antibiotic coverage  on Dec 05, 2008.  The patient was reviewed  by psychiatrist on Dec 05, 2008, Dr. Jeanie Sewer recommended to discontinue the Elavil because of  drug-drug interaction, reducing the dose of the Paxil, monitoring the  antipsychotic treatment.  The patient was transferred out of the  intensive care unit on Dec 05, 2008.  Mood was stabilized at this time;  however, he was noted to be hypotensive, started on IV fluid normal  saline at 120 mL an hour, was also transfused with 1 unit of packed red  blood cell with improved in hematocrit and blood pressure levels.  He  has been discharged home with home health followup today being cleared  by Surgery.   DISCHARGE CONDITION:  Stable.   DIET:  Low-sodium, heart healthy.  Counseled on tobacco cessation.   ACTIVITY:  Increase slowly as tolerated.   FOLLOWUP:  1. Follow up with his primary care physician Dr. Alinda Deem in the      next 5-7 days.  Follow up with Dr. Abbey Chatters, Surgery, on December 26, 2008, wet to dry dressing  to the lower  leg wound  daily.   MEDICATIONS ON  DISCHARGE:  1. Paxil 40 mg daily.  2. Metoprolol 12.5 mg p.o. twice daily.  3. Lotrimin cream topically twice daily for 1 week.  4. Thiamine 100 mg daily.  5. Haldol 2 mg p.o. t.i.d.  6. Neosporin ointment daily dressing to the wound.  7. Phenergan 12.5 mg p.o. q.8 h. as needed.  8. Flonase 1 spray b.i.d.  9. Percocet 5/325 one to two tablets q.6 h. p.r.n.  10.Flexeril 10 mg q.8 h. p.r.n.  11.Calcium 2 tablets daily.  12.Vitamin C 1 tablet daily.  13.Multivitamin 1 tablet daily.  14.Folic acid 1 mg daily.  15.Prednisone 10 mg daily.  16.Humira 40 mg biweekly.  17.Methotrexate 0.8 mL weekly.  18.Niferex 150 mg b.i.d.   PHYSICAL EXAMINATION:  GENERAL:  Middle-age man, not in acute distress.  He is clinically pale, jaundiced.  VITAL SIGNS:  Stable, temperature is 98, pulse 64, respiratory rate is  18, blood pressure 115/70.  HEENT:  Head is atraumatic, normocephalic.  Oropharynx and nasopharynx  are  clear.  NECK:  Supple.  Mucous membranes are moist.  CHEST:  Clinically clear.  HEART:  Heart sounds 1 and 2 regular rate and rhythm.  ABDOMEN:  Soft, nontender.  Wound site is neat.  Bowel sounds are  present, no organomegaly.  NEUROLOGIC:  He is alert and oriented to time, place, and person.  Mood  is stable.  EXTREMITIES:  Peripheral pulses are present.  No pedal edema.   LABORATORY DATA:  WBC is 7.4, hematocrit 27, and platelet count is 351.  Chemistry, sodium is 125, potassium 3.7, chloride 98, bicarbonate 29,  glucose 101, BUN 3, and creatinine 0.75.      Herbie Saxon, MD  Electronically Signed    MIO/MEDQ  D:  12/08/2008  T:  12/09/2008  Job:  [id number]   cc:   Adolph Pollack, M.D.  Alinda Deem, M.D.

## 2010-12-03 NOTE — Group Therapy Note (Signed)
NAMEGLENDA, KUNST                 ACCOUNT NO.:  [id number]   MEDICAL RECORD NO.:  [id number]          PATIENT TYPE:  INP   LOCATION:  2610                         FACILITY:  [facility]   PHYSICIAN:  Beckey Rutter, MD  DATE OF BIRTH:  March 30, 1956                                 PROGRESS NOTE   Please refer to the previous dictated interim summary/progress note by  Dr. Abram Sander on Nov 28, 2008.   For the last week, the patient was seen by the intensive care team.  Please refer to the note for details.  Essentially the patient, after  the surgery, developed confusion, felt secondary to ethanol abuse,  although his wife stated that he is not consuming all of alcohol prior  to his surgery.  The patient was taking Elavil which could exhibit some  withdrawal symptoms, but currently the patient is improving other than  sundowning and mainly confusional night which is managed by night  sitter.  The patient now is in Elavil and psychotropic medication.   The wife felt that the patient is depressed after the diagnosis of  rheumatoid arthritis and currently he is kind of ashamed of the  ileostomy, and for that reason psychiatric consultation was requested  for evaluation and recommendation.   I had a lengthy discussion with his wife about his case several times  and discussed with her the surgical parts managed by surgical team.  Currently, the surgical wound seems to be stable.  No evidence of  infection and the ileostomy tube is functioning in a good way.  Discharge diagnosis discharge medication will be finalized on the actual  day of discharge.      Beckey Rutter, MD  Electronically Signed     EME/MEDQ  D:  12/05/2008  T:  12/05/2008  Job:  [id number]

## 2010-12-03 NOTE — H&P (Signed)
Eddie Taylor, KNIGHTON                 ACCOUNT NO.:  [id number]   MEDICAL RECORD NO.:  [id number]          PATIENT TYPE:  INP   LOCATION:  5525                         FACILITY:  [facility]   PHYSICIAN:  Monte Fantasia, MD  DATE OF BIRTH:  22-Feb-1956   DATE OF ADMISSION:  11/24/2008  DATE OF DISCHARGE:                              HISTORY & PHYSICAL   PRIMARY CARE PHYSICIAN:  Unassigned to [employer].   CHIEF COMPLAINT:  Abdominal pain, generalized going on since past 2-3  days, increased since past 24 hours.   HISTORY OF PRESENT ILLNESS:  A 55 year old male patient has been  admitted on Nov 24, 2008, with complaints of abdominal pain.  The patient  states that the pain is generalized, spasmodic in nature, has constant  pain of more than 7-8, which increases more and spasms with no  aggravating factors almost to 10/10 in intensity, nonradiating.  The  patient denies of any complaints of constipation, denies any complaints  of diarrhea and no complaints of nausea, vomiting, no complaints of  diaphoresis and no complaints of blood in stools.  No complaints of  melena or passing dark colored stools.  The patient states that this  pain is going on since last 3-4 days, has increased over past 24 hours.  No complaints of fever, no complaints of pain on passing urine.  No  complaints of increased frequency of urination.   ALLERGIES:  No known drug allergies.   PAST MEDICAL HISTORY:  Rheumatoid arthritis and depression.   FAMILY HISTORY:  Noncontributory.   SOCIAL HISTORY:  The patient currently smokes half-a-pack a day for past  many years.  No history of drinking.  No history of IV drug use.   TRAVEL HISTORY:  No recent air travel or domestic travel or foreign  travel.   MEDICATIONS AT HOME:  1. Methotrexate 0.8 mL weekly.  2. Humira 40 mL biweekly.  3. Prednisone 10 mg once daily.  4. Folic acid 1 mg twice daily.  5. Vicodin 500 twice daily p.r.n. pain.  6. Amitriptyline 50 mg  nightly.  7. Paxil 60 mg p.o. daily.  8. Multivitamins once daily.  9. Vitamin C 60 mg once daily.  10.Calcium 2 tablets once daily.   SURGICAL HISTORY:  None.   REVIEW OF SYSTEMS:  All 12 point systems have been negative except those  mentioned in HPI.   PHYSICAL EXAMINATION:  VITAL SIGNS:  Temperature of 99.8, blood pressure  115/85, pulse of 111, respiratory rate of 26.  O2 sats of 97% room air.  HEENT:  Neck is supple.  Pupils equal reacting to light.  No pallor.  No  lymphadenopathy.  No JVDs, no icterus.  RESPIRATORY:  Air entry is bilaterally equal.  No rales or rhonchi.  CARDIOVASCULAR:  S1 and S2 regular rate and rhythm.  ABDOMEN:  Distention plus tenderness, generalized throughout the  abdomen.  Bowel sounds.  No masses felt.  Guarding due to pain.  EXTREMITIES:  No edema of feet.  Pedal pulses well felt.  NEUROLOGICAL:  Alert, awake, oriented x3.  Cranial nerves II through  XII  intact.  No focal neurological deficits.  SKIN:  Intact.  No rashes, no petechia.  PSYCHIATRIC:  Normal.   At the time of admission, CT abdomen and pelvis done on admission.  CT  abdomen impression, 5-6 mm nodule in the right middle lobe, 3-mm nodule  in the right middle lobe.  Distention of the colon with air low-density  stools from the cecum as far as the sigmoid colon, some wall thickening  of the colon suggesting low-level colitis.  CT pelvis, no additional  significant findings in CT pelvis.   LABORATORY DATA:  Next labs done on admission, total WBCs of 11.7,  hemoglobin 15.1, hematocrit 42.6, platelets of 237.  Sodium 135,  potassium 3.3, chloride 101, bicarb 24, glucose 123.  BUN 7, creatinine  0.93, total bilirubin 1.0, alkaline phosphatase 63.  AST is 25, ALT is  27, total protein 7.4, albumin 4.0, calcium of 9.2, lactic acid 6.0,  lipase of 14.  Fecal occult blood is negative.   ASSESSMENT:  1. Acute abdominal pain.  2. Pancolitis.  3. Hypokalemia.  4. Colonic distention.    PLAN:  1. We will admit the patient to telemetry bed, keep the patient n.p.o.      for now.  Started to give him IV fluids of D5 half normal at 8:00      a.m. per hour.  We will start the patient on antibiotic,      ciprofloxacin, and Flagyl for the colitis.  The patient does have      acute abdomen.  If the patient's abdominal pain does not improve in      next 24 to 48 hours, may need surgical evaluation for the same.      Also, may consider GI evaluation in next 24 hours.  The patient is      hypokalemic with a potassium of 3.3, would supplement the same,      also would check for magnesium levels and need to supplement if      magnesium levels are low.  At present, would continue the patient's      home medications of prednisone, amitriptyline, and Paxil.  Would      also have a repeat abdominal x-ray in a.m., would need to check his      TSH level.  2. In view of his rheumatoid arthritis, the patient is on methotrexate      and Humira.  We will hold the medications for now.  At present,      would just continue with his prednisone.  3. In view of his depression, the patient is on amitriptyline and      Paxil, would continue the same.  4. For his DVT and GI prophylaxis, the patient would continue on      heparin and Protonix, would give at present IV Protonix q.12 h 40      mg.   TOTAL TIME FOR ADMISSION:  Forty five minutes.      Monte Fantasia, MD  Electronically Signed     MP/MEDQ  D:  11/24/2008  T:  11/25/2008  Job:  [id number]

## 2010-12-03 NOTE — Consult Note (Signed)
Eddie Taylor, Eddie Taylor                 ACCOUNT NO.:  [id number]   MEDICAL RECORD NO.:  [id number]          PATIENT TYPE:  INP   LOCATION:  5525                         FACILITY:  [facility]   PHYSICIAN:  Angelia Mould. Derrell Lolling, M.D.DATE OF BIRTH:  [birth year]   DATE OF CONSULTATION:  11/25/2008  DATE OF DISCHARGE:                                 CONSULTATION   REASON FOR CONSULTATION:  Evaluate colonic distention and possible  colonic obstruction.   HISTORY OF PRESENT ILLNESS:  This is a 55 year old Caucasian male, who  has no prior history of any gastrointestinal problems.  He states that 4  days ago, he developed abdominal distention and mild diffuse abdominal  discomfort.  He has had stools every day since that time, but before he  had normal regular daily stools without any problem whatsoever, his  stools the last 3 or 4 days have been of much smaller volume.  He has  not seen any blood.  He has never had a history of colitis.  He has  never had any intestinal surgery.   He was admitted to this hospital yesterday.  Abdominal x-ray shows  distention of the small bowel, distention of the right colon and  transverse colon.  A CT scan shows likewise, distention of the small  bowel and entire colon down to the left lower quadrant where there  appears to be a wall thickening and stricture.  I have discussed this  with Dr. Beckie Salts and Dr. Melvia Heaps.  We all believe that this  could be either neoplastic or some type of inflammatory stricture either  diverticular, or less likely, inflammatory bowel disease.  The fact that  he has no prior history of inflammatory bowel disease, makes me believe  it is either neoplastic or diverticular stricture.   Dr. Arlyce Dice plans to perform a sigmoidoscopy first thing tomorrow morning  and either place a decompressing tube or a stent across the stricture in  hopes that we can decompressive him and do a more semi-elective one-  stage resection.   PAST  MEDICAL HISTORY:  1. Rheumatoid arthritis.  2. Depression.   CURRENT MEDICATIONS:  1. Methotrexate weekly.  2. Humira 40 mg biweekly.  3. Prednisone 10 mg daily.  4. Folic acid 1 mg twice daily.  5. Vicodin p.r.n.  6. Amitriptyline 50 mg nightly.  7. Paxil 60 mg daily.  8. Multivitamins.  9. Vitamin C.  10.Calcium.   DRUG ALLERGIES:  None known.   FAMILY HISTORY:  No familial intestinal diseases.   SOCIAL HISTORY:  Currently smokes one-half pack of cigarettes a day for  many years.  No history of drinking.  No history of IV drug abuse.  No  recent travel.   REVIEW OF SYSTEMS:  Ten-point review of systems is performed that is  noncontributory except what is mentioned above.   PHYSICAL EXAMINATION:  GENERAL:  Alert, pleasant Caucasian gentleman,  sitting up in bed, does not appear to be any distress.  VITAL SIGNS:  Temperature 97.3, blood pressure 124/83, pulse 76,  respiratory rate 20, and oxygen saturation 92% on room  air.  HEENT:  Eyes, sclerae clear.  Extraocular movements intact.  Ears,  mouth, throat, nose, lips, tongue, and oropharynx are without gross  lesions.  NECK:  Supple.  Nontender.  No adenopathy.  No jugular venous  distention.  LUNGS:  Clear to auscultation.  No rales.  No wheezes.  HEART:  Regular rate and rhythm.  No murmur.  Radial and femoral pulses  are palpable.  ABDOMEN:  Distended, tympanitic, tight.  He has mild tenderness to deep  palpation.  No guarding, no rebound.  No mass.  No hernia.  No scars.  Bowel sounds are hypoactive.  GENITOURINARY:  There is no inguinal hernia or mass.  EXTREMITIES:  He moves all 4 extremities well without pain or deformity.  NEUROLOGIC:  No gross motor sensory deficit.   ADMISSION DATA:  White blood cell count of 11,700 and hemoglobin 15.1.  Liver function tests normal.  Fecal occult blood negative.  CT scan of  abdominal films are as mentioned above.   ASSESSMENT:  1. Acute colonic distention.  From the  appearance of the CT scan, I      suspect that this is a mechanical obstruction in the left lower      quadrant, either neoplastic or chronic and inflammatory stricture.  2. The patient will need decompression very soon.  I have discussed      this with Dr. Melvia Heaps.  He plans to perform a sigmoidoscopy,      first thing tomorrow morning and if he encounters a stricture, he      is going to try to put a decompressing tube or stent across this in      hopes that we could temporarily palliate the patient's distention,      and he can undergo a bowel prep and one-stage colon resection.   If we are unable to decompress him, he will require a colon resection  with colostomy urgently, regardless of the etiology of the obstruction.   I have discussed this with the patient.  He is aware that he will need  an operation this admission.  He is aware that this may be an  inflammatory stricture and he is aware that this may be cancer.  He is  willing for [place] to do whatever is necessary to correct his problem.      Angelia Mould. Derrell Lolling, M.D.  Electronically Signed     HMI/MEDQ  D:  11/25/2008  T:  11/26/2008  Job:  [id number]   cc:   Barbette Hair. Arlyce Dice, MD,FACG

## 2010-12-03 NOTE — Consult Note (Signed)
Eddie Taylor, Eddie Taylor                 ACCOUNT NO.:  [id number]   MEDICAL RECORD NO.:  [id number]          PATIENT TYPE:  INP   LOCATION:  5525                         FACILITY:  [facility]   PHYSICIAN:  Barbette Hair. Arlyce Dice, MD,FACGDATE OF BIRTH:  13-Dec-1955   DATE OF CONSULTATION:  11/25/2008  DATE OF DISCHARGE:                                 CONSULTATION   REASON FOR CONSULTATION:  Abdominal x-ray and abdominal pain.   HISTORY OF PRESENT ILLNESS:  Eddie Taylor is a 55 year old white male  admitted with abdominal pain.  Approximately 2 days prior to admission,  he developed abdominal distention, diffuse pain including crampy  abdominal pain.  Throughout, he continued to move his bowels, though he  claims there is small caliber.  There is no history of fever, melena, or  hematochezia.  He takes prednisone, Humira, and methotrexate for his  rheumatoid arthritis.  On admission, a CT scan demonstrated a very  distended colon up to the level of the descending colon with distention  of the small bowel as well.  He complains of excess irritations, but  denies nausea or vomiting.   PAST MEDICAL HISTORY:  Pertinent for rheumatoid arthritis for which he  takes methotrexate, Humira, and 10 mg of prednisone daily.   MEDICATIONS:  As described above.  In addition, he takes folate,  Vicodin, amitriptyline, Paxil.   ALLERGIES:  He has no allergies.   SOCIAL HISTORY:  He drinks rarely and smokes rarely.  He is married.   REVIEW OF SYSTEMS:  Positive for joint pains.   PHYSICAL EXAMINATION:  GENERAL:  He is a well-developed, well-nourished  male.  He is anicteric.  HEENT:  Within normal limits.  CHEST:  Clear.  CARDIAC:  No murmurs, gallops, or rubs.  ABDOMEN:  Grossly distended and tympanitic.  Bowel sounds are present,  but decreased.  There is minimal periumbilical tenderness without  guarding or rebound.  There are no abdominal masses or organomegaly.  RECTAL:  Deferred (the patient claims he has  Hemoccult negative in the  ER).  There is a 1-cm nodule just adjacent to the umbilicus.  Nodules  are subcutaneous and nontender.  EXTREMITIES:  There is no peripheral edema.  NEUROLOGIC:  He is alert and oriented x3.  There are no focal neurologic  abnormalities.   LABORATORY DATA:  Pertinent for hemoglobin 13.4, white count 9.0.  Electrolytes are within normal limits.  Lipase is normal.   CT of the abdomen and pelvis reviewed by myself, demonstrates distention  of the colon and the small bowel to the level of the descending colon  with questionable thickness of the bowel.   IMPRESSION:  Abdominal pain with small bowel and large bowel distention.  Question of partial obstruction is raised.  This does not appear to be a  sigmoid volvulus.  Colonic ileus from occult infection is a  consideration since the patient is immunocompromised.  He could also  have an occult colitis, though there is no history of diarrhea or  bleeding.  With his immunocompromised state, he could have underlying  pseudomembranous colitis or possibly ischemic  colitis.   RECOMMENDATIONS:  1. A flexible sigmoidoscopy.  2. Check stools for C and S and C. difficile toxin.      Barbette Hair. Arlyce Dice, MD,FACG  Electronically Signed     RDK/MEDQ  D:  11/25/2008  T:  11/26/2008  Job:  [id number]

## 2010-12-03 NOTE — Group Therapy Note (Signed)
NAMECALLOWAY, Eddie NO.:  [id number]   MEDICAL RECORD NO.:  [id number]          PATIENT TYPE:  INP   LOCATION:  5525                         FACILITY:  [facility]   PHYSICIAN:  Ruthy Dick, MD    DATE OF BIRTH:  01/01/1956                                 PROGRESS NOTE   PROBLEM LIST:  1. Large bowel obstruction status post colectomy and ileostomy,      management per surgery.  2. Postop blood loss anemia.  We will monitor hemoglobin and      hematocrit.  3. Hypokalemia, resolved.  4. Depression.  5. Dyslipidemia.  6. Lung nodule.  7. Tobacco abuse.   RECOMMENDATION:  1. Have a follow-up CT scan in six months to follow-up on this nodule.  2. Rheumatoid arthritis.  I spoke at length to physician assistant      with the patient's rheumatologist and the recommendation is that      the patient should be off his rheumatoid arthritis medication for 2      weeks post surgery.  3. Ringworm, being treated with clotrimazole cream.   CONSULTATIONS:  1. GI consult.  2. Surgical consult.   PROCEDURES:  1. Flexible sigmoidoscopy with tube placement in the area of stricture      of the colon.  2. Total abdominal colectomy with an ileostomy.  3. CT scan of the abdomen, pelvis, who was done May 7 and was read as      having a 5-6 mm nodule in the right middle lobe and also another 3      mm nodule in December at middle lobe.  Recommendation was for the      patient to have a repeat CT scan in 6 months since the patient is      also a smoker.  4. A chest x-ray.  There were no new nodules noted on the chest x-ray,      so I deferred further CT scan of the chest at this time.   BRIEF HISTORY OF PRESENT ILLNESS AND HOSPITAL COURSE:  This is a 55 year old gentleman with past medical history significant  for rheumatoid arthritis and tobacco abuse who came into the hospital  with generalized abdominal pain which had been going on for about 2-3  days.  CAT scan revealed  narrowing of the sigmoid colon area.  A  flexible sigmoidoscopy also confirmed that there was a stricture in that  area probably caused by prior diverticulitis.  Attempt to dilate this  area and placement of tubes did not resolve the patient's symptoms.  Because of this, patient was taken to the OR for definite procedure.  As  noted above, this was done successfully yesterday and management in this  area is according to the surgical team.  As noted above, the patient is  to stay off his rheumatoid arthritis medication of Humira and  methotrexate for a couple of weeks postop, and this is according to the  rheumatology recommendation.  Prednisone can be continued.   DISPOSITION:  At this will be according to the surgical team.  PHYSICAL EXAMINATION:  VITAL SIGNS:  Today his vitals are stable.  Temperature 98.5, pulse 125,  respirations 18, blood pressure 127/77, saturating 95% on 3 liters.  CHEST:  Clear to auscultation bilaterally.  ABDOMEN:  Soft but slightly tender in the area of surgical incision.  No  rebound or guarding.  EXTREMITIES:  No clubbing, cyanosis or edema.  CARDIOVASCULAR:  First and second heart sounds only.  CENTRAL NERVOUS SYSTEM:  Nonfocal.  Suspect that the patient's  tachycardia may be due to pain and pain medication will have to modify.      Ruthy Dick, MD  Electronically Signed     GU/MEDQ  D:  11/28/2008  T:  11/28/2008  Job:  [id number]

## 2010-12-03 NOTE — Consult Note (Signed)
[place], HIJAZI NO.:  [id number]   MEDICAL RECORD NO.:  [id number]          PATIENT TYPE:  INP   LOCATION:  2610                         FACILITY:  [facility]   PHYSICIAN:  Antonietta Breach, M.D.  DATE OF BIRTH:  08/11/1955   DATE OF CONSULTATION:  12/05/2008  DATE OF DISCHARGE:                                 CONSULTATION   REQUESTING PHYSICIAN:  [facility].   REASON FOR CONSULTATION:  Depression, psychosis, confusion.   HISTORY OF PRESENT ILLNESS:  Mr. Chanler Mendonca is a 55 year old male  admitted to the [facility] on Nov 24, 2008 due to abdominal pain and  colitis.   Mr. Bou had colonic obstruction that required resection.  He now has  an ostomy.  He has experienced approximately 7 days of progressive  confusion.  However, so far today he has not been confused.  He was  having memory dysfunction and today he is able to recall events during  his confusion.  He did have some disorientation.  However, today he is  oriented properly.   He is socially appropriate and cooperative with staff.  He is not having  any thoughts of harming himself or others.  He has no hallucinations or  delusions.  He is on Paxil for anti depression.  He describes  constructive future goals and interests.  His energy is still decreased.   The period of confusion and decreased energy does correlate with his  general medical problem and treatment.   He has been on Elavil 50 mg q.h.s. along with Paxil 60 mg daily as an  outpatient.  Mr. Bonn gives the undersigned permission to discuss his  case with his wife.  His wife confirms that he was receiving his Paxil  an Elavil consistently as an outpatient.   He has also been placed on amitriptyline 50 mg q.h.s. for pain.   PAST PSYCHIATRIC HISTORY:  Mr. Nester developed depression approximately  2 years ago and was placed on Paxil, gradually increased to 60 mg daily  with good results.     During Mr. Doucet a period of  confusion, he was having delusions and  severe agitation.  He was placed on Haldol at 4 mg q.6 hours.  He also  has been on Risperdal which was being given 5 mg q.12 hours.   FAMILY PSYCHIATRIC HISTORY:  None known.   SOCIAL HISTORY:  Mr. Cali is married to a very supportive wife.  He  does not use any illegal drugs.  He rarely drinks alcohol.  He is  medically retired.   PAST MEDICAL HISTORY:  Please see the above.  He has severe rheumatoid  arthritis which has been treated with methotrexate as an outpatient.   MEDICATIONS:  The MAR is reviewed.  Please see the discussion above.   ALLERGIES:  No known drug allergies.   LABORATORY DATA:  Phosphorus normal.  Magnesium normal.  Sodium 139, BUN  3, creatinine 0.66, glucose 100, WBC 7.5, hemoglobin 8.9, platelet count  295,000.   EKG on the May 10 showed a QRS of 90 and a  QTC of 449.  On May 15 his  QTC was 487.  Today's QRS is 102 and the QTC is 434.   REVIEW OF SYSTEMS:  Constitutional, head, eyes, ears, nose, throat,  mouth, neurologic, psychiatric, cardiovascular, respiratory,  gastrointestinal, genitourinary, skin, musculoskeletal, hematologic,  lymphatic, endocrine, and metabolic are all unremarkable.   EXAMINATION:  VITAL SIGNS:  Temperature 98.6, pulse 99, respiratory rate  23, blood pressure 131/78, O2 saturation on room air 92%.  GENERAL APPEARANCE:  Mr. Mandigo is a middle-aged male lying in a supine  position in his hospital bed with no abnormal involuntary movements.   MENTAL STATUS EXAM:  Mr. Housand is alert.  His attention span is slightly  decreased.  His eye contact is good.  His affect is mildly flat at  baseline but there is a normal range.  His mood is within normal limits.  He is oriented completely to all spheres.  Memory 3/3 immediate, 3/3 at  recall.  His fund of knowledge and intelligence are within normal  limits.  His speech involves normal rate and prosody without dysarthria.  Thought process is logical,  coherent, goal-directed.  No looseness of  associations.  Thought content no thoughts of harming himself or others.  No delusions or hallucinations.  His insight is intact.  His judgment is  intact.   ASSESSMENT:  AXIS I:  1. 293.00 delirium not otherwise specified.  2. Mr. Rodas appears to have had a number of factors involved in his      delirium.  These factors include having a chronic inflammatory      condition which can raise the cytokine level.  He also has required      acute surgery.  He has required pain treatment.  He has been      maintained for the most part on amitriptyline which has a strong      anticholinergic side-effect.  Paxil does have some anticholinergic      activity.  3. His improvement in delirium is correlated with the course of      antipsychotic therapy.  4. 293.83 mood disorder not otherwise specified (idiopathic and      general medical factors), depressed, stable on his current regimen.  AXIS II:  Deferred.  AXIS III:  See past medical history.  AXIS IV:  General medical.  AXIS V:  55.   MEDICAL DECISION MAKING:  Mr. Harvie is not at risk to harm himself or  others.  He agrees to call emergency services immediately for any  thoughts of harming himself, thoughts of harming others or distress.   Regarding his delirium, by definition, it can wax and wane.  He may have  a re-exacerbation again before it finally remains resolved.   The undersigned discussed the indications, alternatives and adverse  effects of the following agents with Mr. Beals and his wife:  Risperdal,  Haldol, amitriptyline, Paxil, as well as an alternative use of Celexa  for Paxil.   They understand and would like to proceed as below.   RECOMMENDATIONS:  1. Given his current improvement and his EKG pattern, would stop the      Risperdal and will reduce his Haldol dosing to 2 mg q.8 hours.  2. Would recheck his EKG tomorrow.  3. If he continues to remain clear from delirium would  discontinue the      Haldol over the next 3 days.  4. Paxil and amitriptyline compete for the cytochrome P 450 2D6  enzyme.  Therefore, Paxil can double the level of amitriptyline.      Given the current QRS on the EKG, would hold the Elavil for now and      recheck an EKG tomorrow.  Would also continue telemetry.  5. It is noted that the Elavil was discontinued and then restarted.      Therefore, the pattern noted with the QRS could be a reflection of      an Elavil effect.  It is also noted that There are other      psychotropics that can affect the overall QT interval such as      Haldol and Risperdal.  Please see the above recommendations.  6. The undersigned discussed the possibility of switching Mr. Tooker      Paxil with Celexa to avoid the 2D6 problem.  However, Mr. Montminy      does not want to change his antidepressant regimen at this time.  7. If his EKG findings on May 10 reflect a steady state of his regimen      of Paxil with Elavil (minus the antipsychotics used acutely), he      may be able to tolerate the Paxil/Elavil combination from a      cardiologic point-of-view  8. However, it is important to confirm that he is not having any toxic      side effects when these medications are at steady state.  9. Therefore, with the above issues in mind would discontinue the      Elavil for now along with the reduction of the antipsychotic      regimen as discussed and would recheck Mr. Filip EKG confirming a      return back to his baseline QRS.  10.If the Elavil is to be restarted later, the above interactions      actions will need to be kept in mind.   PRELIMINARY DISCHARGE PLANNING:  Psychiatric followup can be found at  one of the clinics attached to [facility], [place], or  [facility].      Antonietta Breach, M.D.  Electronically Signed     JW/MEDQ  D:  12/05/2008  T:  12/05/2008  Job:  [id number]

## 2012-07-21 HISTORY — PX: SURGERY SCROTAL / TESTICULAR: SUR1316

## 2013-03-12 ENCOUNTER — Encounter (HOSPITAL_COMMUNITY): Payer: Self-pay

## 2013-03-12 ENCOUNTER — Inpatient Hospital Stay (HOSPITAL_COMMUNITY)
Admission: EM | Admit: 2013-03-12 | Discharge: 2013-03-16 | DRG: 379 | Disposition: A | Discharge status: Home / Self Care | Payer: Medicare Other | Attending: Internal Medicine | Admitting: Internal Medicine

## 2013-03-12 DIAGNOSIS — Z9049 Acquired absence of other specified parts of digestive tract: Secondary | ICD-10-CM | POA: Diagnosis present

## 2013-03-12 DIAGNOSIS — E785 Hyperlipidemia, unspecified: Secondary | ICD-10-CM | POA: Diagnosis present

## 2013-03-12 DIAGNOSIS — F319 Bipolar disorder, unspecified: Secondary | ICD-10-CM | POA: Diagnosis present

## 2013-03-12 DIAGNOSIS — R109 Unspecified abdominal pain: Secondary | ICD-10-CM | POA: Diagnosis present

## 2013-03-12 DIAGNOSIS — M069 Rheumatoid arthritis, unspecified: Secondary | ICD-10-CM | POA: Diagnosis present

## 2013-03-12 DIAGNOSIS — K8020 Calculus of gallbladder without cholecystitis without obstruction: Secondary | ICD-10-CM | POA: Diagnosis present

## 2013-03-12 DIAGNOSIS — K921 Melena: Secondary | ICD-10-CM | POA: Diagnosis present

## 2013-03-12 DIAGNOSIS — K264 Chronic or unspecified duodenal ulcer with hemorrhage: Principal | ICD-10-CM | POA: Diagnosis present

## 2013-03-12 DIAGNOSIS — B9681 Helicobacter pylori [H. pylori] as the cause of diseases classified elsewhere: Secondary | ICD-10-CM

## 2013-03-12 DIAGNOSIS — F17200 Nicotine dependence, unspecified, uncomplicated: Secondary | ICD-10-CM | POA: Diagnosis present

## 2013-03-12 DIAGNOSIS — Z933 Colostomy status: Secondary | ICD-10-CM | POA: Diagnosis present

## 2013-03-12 DIAGNOSIS — K279 Peptic ulcer, site unspecified, unspecified as acute or chronic, without hemorrhage or perforation: Secondary | ICD-10-CM | POA: Diagnosis present

## 2013-03-12 DIAGNOSIS — Z79899 Other long term (current) drug therapy: Secondary | ICD-10-CM

## 2013-03-12 DIAGNOSIS — A048 Other specified bacterial intestinal infections: Secondary | ICD-10-CM | POA: Diagnosis present

## 2013-03-12 DIAGNOSIS — D500 Iron deficiency anemia secondary to blood loss (chronic): Secondary | ICD-10-CM | POA: Diagnosis present

## 2013-03-12 HISTORY — DX: Diverticulitis of intestine, part unspecified, without perforation or abscess without bleeding: K57.92

## 2013-03-12 LAB — CBC
HCT: 36.4 % — ABNORMAL LOW (ref 39.0–52.0)
Hemoglobin: 13 g/dL (ref 13.0–17.0)
MCH: 33.9 pg (ref 26.0–34.0)
MCHC: 35.7 g/dL (ref 30.0–36.0)
MCV: 95 fL (ref 78.0–100.0)
Platelets: 272 10*3/uL (ref 150–400)
RBC: 3.83 MIL/uL — ABNORMAL LOW (ref 4.22–5.81)
RDW: 13.7 % (ref 11.5–15.5)
WBC: 11.3 10*3/uL — ABNORMAL HIGH (ref 4.0–10.5)

## 2013-03-12 MED ORDER — ONDANSETRON HCL 4 MG/2ML IJ SOLN
4.0000 mg | Freq: Once | INTRAMUSCULAR | Status: AC
  Administered 2013-03-13: 4 mg via INTRAVENOUS
  Filled 2013-03-12: qty 2

## 2013-03-12 MED ORDER — FENTANYL CITRATE 0.05 MG/ML IJ SOLN
50.0000 ug | INTRAMUSCULAR | Status: DC | PRN
  Administered 2013-03-13 (×5): 50 ug via INTRAVENOUS
  Filled 2013-03-12 (×5): qty 2

## 2013-03-12 MED ORDER — SODIUM CHLORIDE 0.9 % IV SOLN
INTRAVENOUS | Status: DC
  Administered 2013-03-12 – 2013-03-15 (×5): via INTRAVENOUS
  Administered 2013-03-16: 1000 mL via INTRAVENOUS

## 2013-03-12 NOTE — ED Notes (Signed)
PER EMS: pt from home, complaints of abdominal pain all day but increasing in pain and sharpness x 1 hr ago. Pt has colostomy bag since 2010 due to a ruptured colon during a 2009 surgery for diverticulitis. Pt states he has black stool in his colostomy bag and has only had to change it once today but usually he changes it 4-5 times per day. Pt also reports Nausea and states he has been having fevers. Temp via EMS 98.7 and denies taking anything for a fever today. Appears diaphoretic. VS: 168/104, HR-88, 99% RA, RR-18, CBG-107.

## 2013-03-13 ENCOUNTER — Encounter (HOSPITAL_COMMUNITY)
Admission: EM | Disposition: A | Discharge status: Home / Self Care | Payer: Self-pay | Source: Home / Self Care | Attending: Family Medicine

## 2013-03-13 ENCOUNTER — Emergency Department (HOSPITAL_COMMUNITY): Payer: Medicare Other

## 2013-03-13 ENCOUNTER — Inpatient Hospital Stay (HOSPITAL_COMMUNITY): Payer: Medicare Other

## 2013-03-13 ENCOUNTER — Encounter (HOSPITAL_COMMUNITY): Payer: Self-pay | Admitting: Radiology

## 2013-03-13 DIAGNOSIS — K921 Melena: Secondary | ICD-10-CM | POA: Diagnosis present

## 2013-03-13 DIAGNOSIS — K279 Peptic ulcer, site unspecified, unspecified as acute or chronic, without hemorrhage or perforation: Secondary | ICD-10-CM

## 2013-03-13 DIAGNOSIS — R109 Unspecified abdominal pain: Secondary | ICD-10-CM | POA: Diagnosis present

## 2013-03-13 DIAGNOSIS — K8020 Calculus of gallbladder without cholecystitis without obstruction: Secondary | ICD-10-CM

## 2013-03-13 DIAGNOSIS — K260 Acute duodenal ulcer with hemorrhage: Secondary | ICD-10-CM

## 2013-03-13 HISTORY — PX: ESOPHAGOGASTRODUODENOSCOPY: SHX5428

## 2013-03-13 LAB — CBC WITH DIFFERENTIAL/PLATELET
Basophils Absolute: 0 10*3/uL (ref 0.0–0.1)
Basophils Relative: 1 % (ref 0–1)
Eosinophils Absolute: 0.3 10*3/uL (ref 0.0–0.7)
Eosinophils Relative: 3 % (ref 0–5)
HCT: 32.2 % — ABNORMAL LOW (ref 39.0–52.0)
Hemoglobin: 11.5 g/dL — ABNORMAL LOW (ref 13.0–17.0)
Lymphocytes Relative: 22 % (ref 12–46)
Lymphs Abs: 1.9 10*3/uL (ref 0.7–4.0)
MCH: 34.4 pg — ABNORMAL HIGH (ref 26.0–34.0)
MCHC: 35.7 g/dL (ref 30.0–36.0)
MCV: 96.4 fL (ref 78.0–100.0)
Monocytes Absolute: 0.7 10*3/uL (ref 0.1–1.0)
Monocytes Relative: 8 % (ref 3–12)
Neutro Abs: 5.6 10*3/uL (ref 1.7–7.7)
Neutrophils Relative %: 66 % (ref 43–77)
Platelets: 237 10*3/uL (ref 150–400)
RBC: 3.34 MIL/uL — ABNORMAL LOW (ref 4.22–5.81)
RDW: 13.8 % (ref 11.5–15.5)
WBC: 8.4 10*3/uL (ref 4.0–10.5)

## 2013-03-13 LAB — GLUCOSE, CAPILLARY: Glucose-Capillary: 111 mg/dL — ABNORMAL HIGH (ref 70–99)

## 2013-03-13 LAB — OCCULT BLOOD, POC DEVICE: Fecal Occult Bld: POSITIVE — AB

## 2013-03-13 LAB — CG4 I-STAT (LACTIC ACID): Lactic Acid, Venous: 1.2 mmol/L (ref 0.5–2.2)

## 2013-03-13 LAB — COMPREHENSIVE METABOLIC PANEL
ALT: 15 U/L (ref 0–53)
ALT: 19 U/L (ref 0–53)
AST: 19 U/L (ref 0–37)
AST: 23 U/L (ref 0–37)
Albumin: 3.3 g/dL — ABNORMAL LOW (ref 3.5–5.2)
Albumin: 3.8 g/dL (ref 3.5–5.2)
Alkaline Phosphatase: 34 U/L — ABNORMAL LOW (ref 39–117)
Alkaline Phosphatase: 38 U/L — ABNORMAL LOW (ref 39–117)
BUN: 11 mg/dL (ref 6–23)
BUN: 14 mg/dL (ref 6–23)
CO2: 22 mEq/L (ref 19–32)
CO2: 23 mEq/L (ref 19–32)
Calcium: 10 mg/dL (ref 8.4–10.5)
Calcium: 11.4 mg/dL — ABNORMAL HIGH (ref 8.4–10.5)
Chloride: 103 mEq/L (ref 96–112)
Chloride: 99 mEq/L (ref 96–112)
Creatinine, Ser: 0.74 mg/dL (ref 0.50–1.35)
Creatinine, Ser: 0.85 mg/dL (ref 0.50–1.35)
GFR calc Af Amer: >90 mL/min (ref >90)
GFR calc Af Amer: >90 mL/min (ref >90)
GFR calc non Af Amer: >90 mL/min (ref >90)
GFR calc non Af Amer: >90 mL/min (ref >90)
Glucose, Bld: 106 mg/dL — ABNORMAL HIGH (ref 70–99)
Glucose, Bld: 109 mg/dL — ABNORMAL HIGH (ref 70–99)
Potassium: 3.6 mEq/L (ref 3.5–5.1)
Potassium: 3.6 mEq/L (ref 3.5–5.1)
Sodium: 136 mEq/L (ref 135–145)
Sodium: 136 mEq/L (ref 135–145)
Total Bilirubin: 0.4 mg/dL (ref 0.3–1.2)
Total Bilirubin: 0.6 mg/dL (ref 0.3–1.2)
Total Protein: 6.4 g/dL (ref 6.0–8.3)
Total Protein: 7.4 g/dL (ref 6.0–8.3)

## 2013-03-13 LAB — CBC
HCT: 32.5 % — ABNORMAL LOW (ref 39.0–52.0)
Hemoglobin: 11.2 g/dL — ABNORMAL LOW (ref 13.0–17.0)
MCH: 33.7 pg (ref 26.0–34.0)
MCHC: 34.5 g/dL (ref 30.0–36.0)
MCV: 97.9 fL (ref 78.0–100.0)
Platelets: 246 10*3/uL (ref 150–400)
RBC: 3.32 MIL/uL — ABNORMAL LOW (ref 4.22–5.81)
RDW: 14 % (ref 11.5–15.5)
WBC: 8.1 10*3/uL (ref 4.0–10.5)

## 2013-03-13 LAB — URINALYSIS, ROUTINE W REFLEX MICROSCOPIC
Bilirubin Urine: NEGATIVE
Glucose, UA: NEGATIVE mg/dL
Hgb urine dipstick: NEGATIVE
Ketones, ur: NEGATIVE mg/dL
Leukocytes, UA: NEGATIVE
Nitrite: NEGATIVE
Protein, ur: NEGATIVE mg/dL
Specific Gravity, Urine: 1.014 (ref 1.005–1.030)
Urobilinogen, UA: 0.2 mg/dL (ref 0.0–1.0)
pH: 8.5 — ABNORMAL HIGH (ref 5.0–8.0)

## 2013-03-13 LAB — TYPE AND SCREEN
ABO/RH(D): O POS
Antibody Screen: NEGATIVE

## 2013-03-13 LAB — URINE MICROSCOPIC-ADD ON

## 2013-03-13 LAB — PROTIME-INR
INR: 1.1 (ref 0.00–1.49)
Prothrombin Time: 14 seconds (ref 11.6–15.2)

## 2013-03-13 LAB — APTT: aPTT: 30 seconds (ref 24–37)

## 2013-03-13 LAB — LIPASE, BLOOD: Lipase: 24 U/L (ref 11–59)

## 2013-03-13 IMAGING — CT CT ABD-PELV W/ CM
2 of 5 series · 16 of 46 positions shown, 18 images · IV contrast (CONTRAST)
Comparison: [DATE]

CLINICAL DATA: All over abdominal pain.  Nausea and vomiting.
History of diverticulitis.

CT ABDOMEN AND PELVIS WITH CONTRAST
TECHNIQUE: Multidetector CT imaging of the abdomen and pelvis was
performed following the standard protocol during bolus
administration of intravenous contrast.
Contrast: 100mL OMNIPAQUE IOHEXOL 300 MG/ML  SOLN

[Series 2: routine · axial · 0.84mm/px · z∈[-527,-92]mm · 13 of 99 slices shown, 15 images]
[im 6/99  soft-tissue]
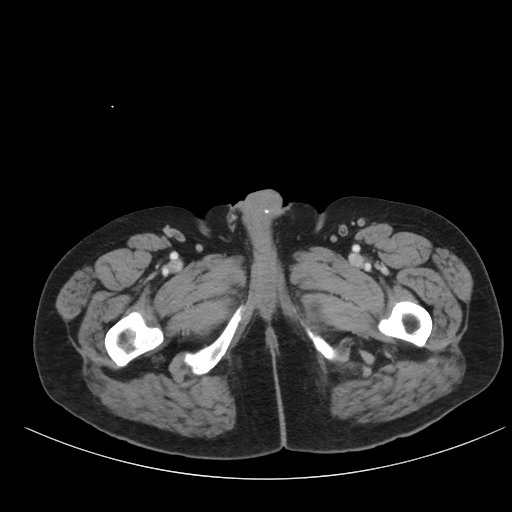
[im 6/99  bone]
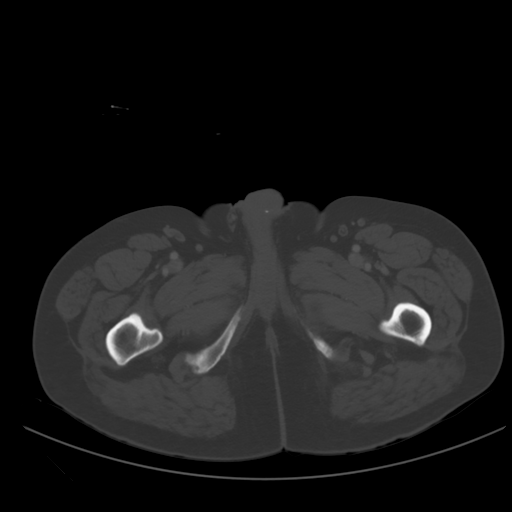
[im 12/99  soft-tissue]
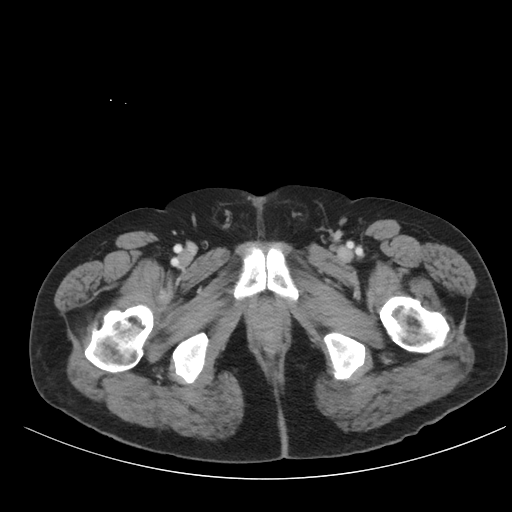
[im 24/99  soft-tissue]
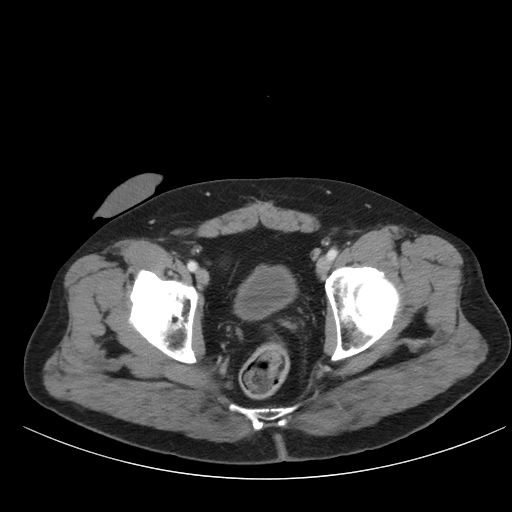
[im 29/99  soft-tissue]
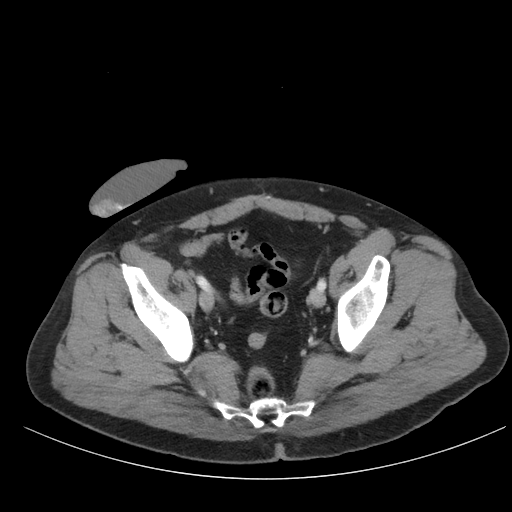
[im 35/99  soft-tissue]
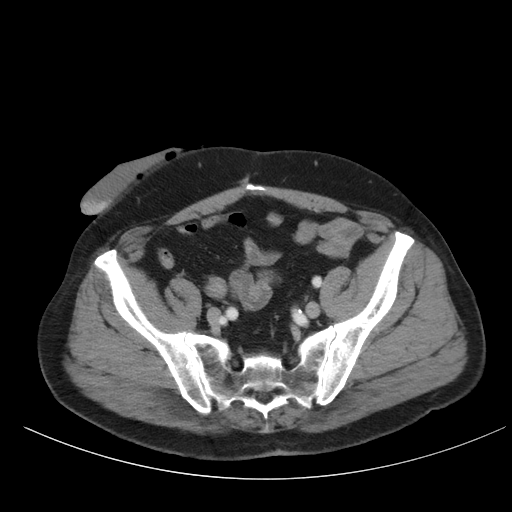
[im 41/99  soft-tissue]
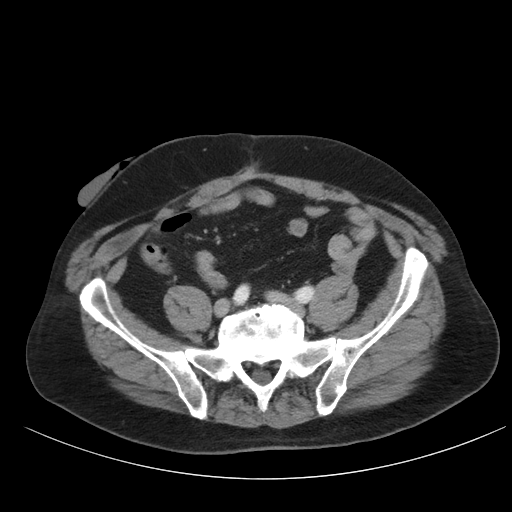
[im 52/99  soft-tissue]
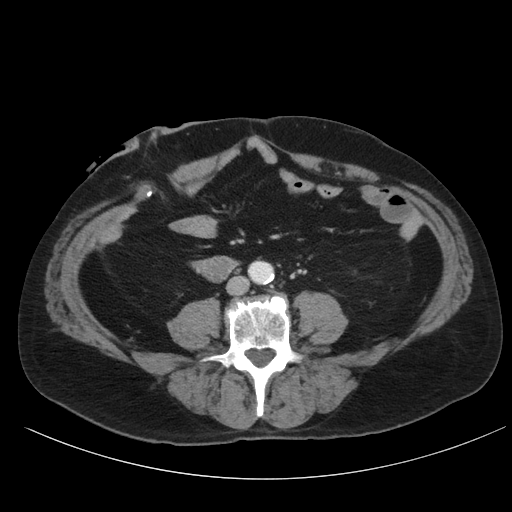
[im 58/99  soft-tissue]
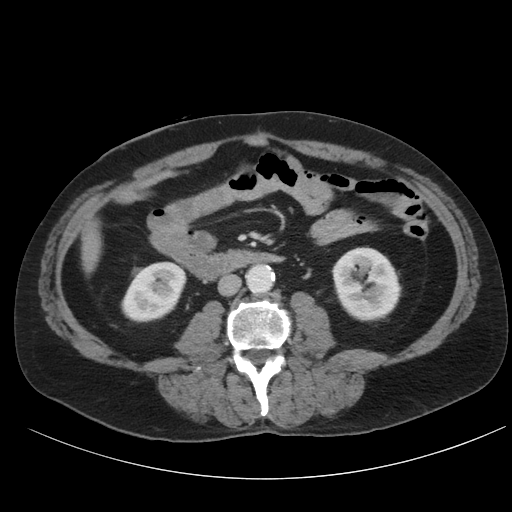
[im 64/99  soft-tissue]
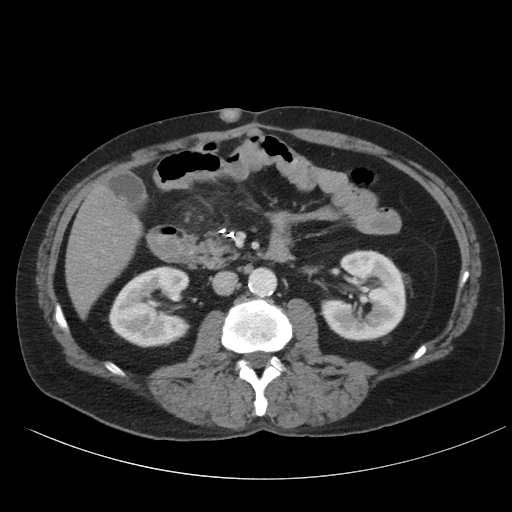
[im 64/99  bone]
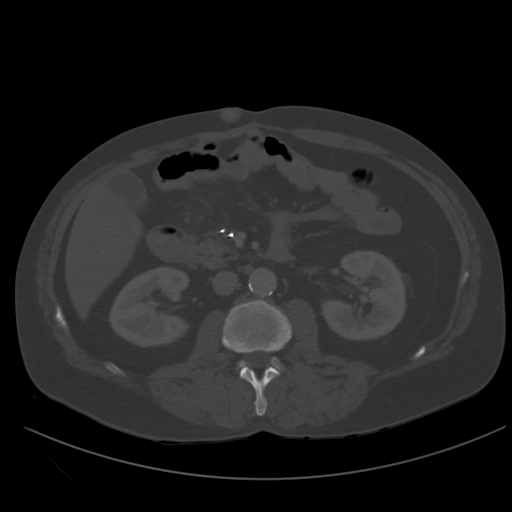
[im 70/99  soft-tissue]
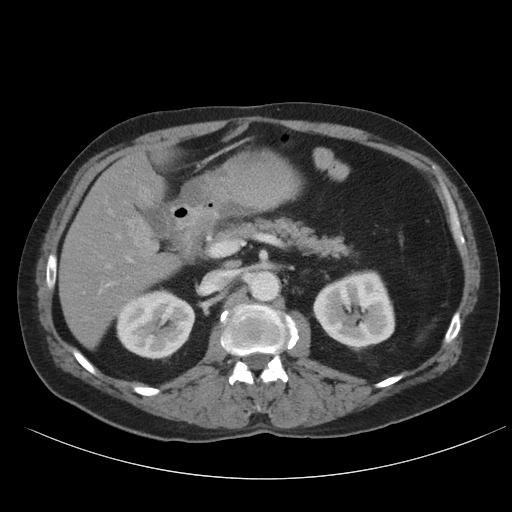
[im 75/99  soft-tissue]
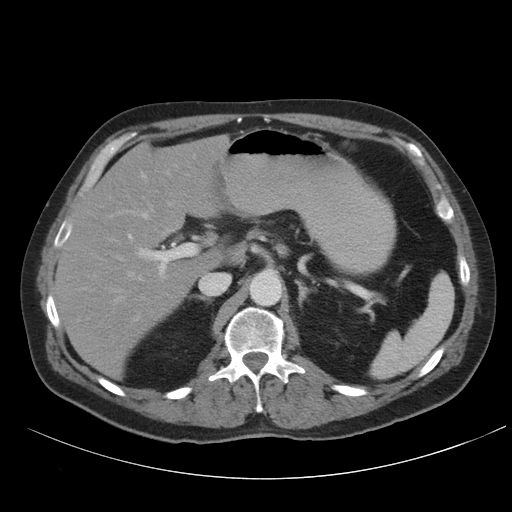
[im 87/99  soft-tissue]
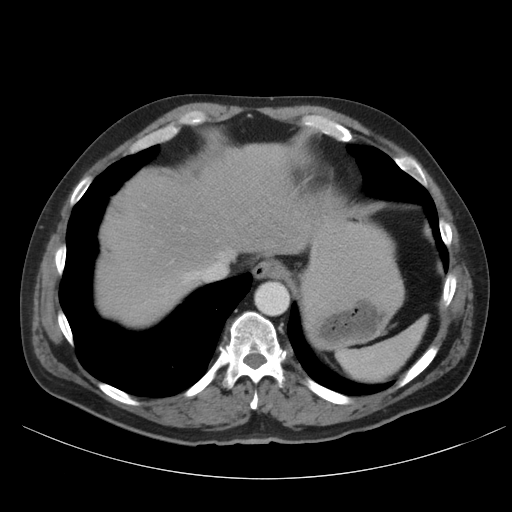
[im 93/99  soft-tissue]
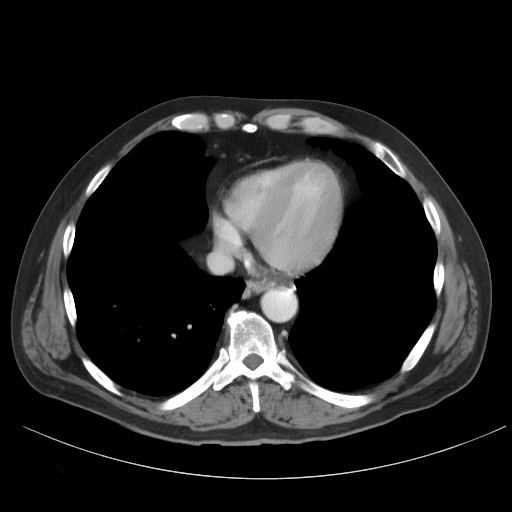

[mpr, coronals, coronal · coronal · 0.96mm/px · 3 of 113 slices shown]
[im 38/113  soft-tissue]
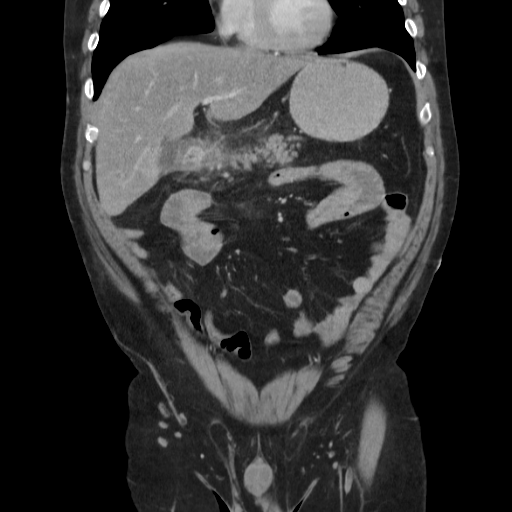
[im 50/113  soft-tissue]
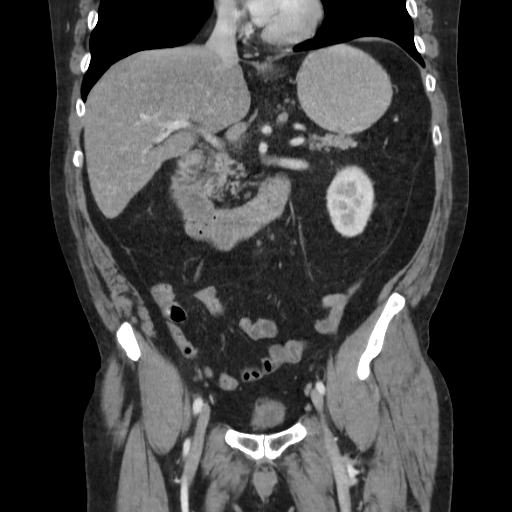
[im 63/113  soft-tissue]
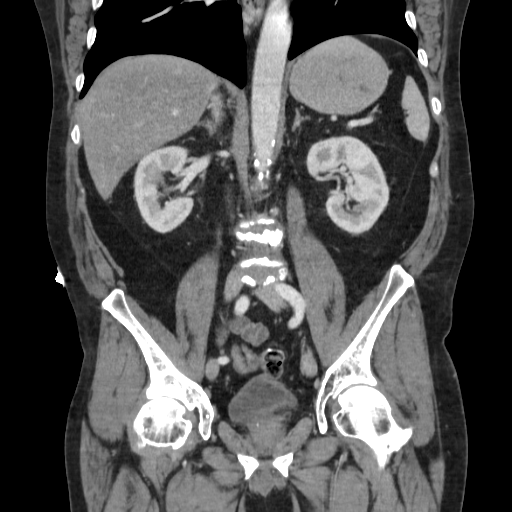

[16 of 46 positions shown; findings below may reference images not displayed]

FINDINGS: Small subpleural nodule in the right lung base measuring
about 4 mm diameter.  This appears stable since the previous study.
Lung bases are otherwise clear.

Diffuse fatty infiltration of the liver.  Cholelithiasis without
gallbladder wall thickening.  There is suggestion of wall
thickening and edema in the pyloric region of the stomach. Mild
surrounding fatty infiltration. Peptic ulcer disease should be
considered.  No gastric distension.  There are prominent lymph
nodes in the celiac axis and gastrohepatic ligament region
measuring up to about 13 mm diameter.  These may be inflammatory.
No retroperitoneal lymphadenopathy.

The pancreas, spleen, adrenal glands, kidneys, inferior vena cava,
and retroperitoneal lymph nodes are unremarkable.  Calcification of
the aorta without aneurysm.  No free fluid or free air in the
abdomen.  Small bowel are decompressed.  Right upper quadrant
colostomy with decompressed distal colon.  Postoperative changes in
the anterior abdominal wall.  Soft tissue nodule in the
subcutaneous fat of the right upper quadrant adjacent to the
midline measures 15.9 cm likely represents a sebaceous cyst.

Pelvis:  Stool filled rectosigmoid colon without inflammatory
change.  Calcification of the prostate gland.  The bladder is
decompressed.  No free or loculated pelvic fluid collections.  No
significant pelvic lymphadenopathy.  Degenerative changes in the
lumbar spine.
IMPRESSION: Edema and surrounding inflammation around the pyloric region of the
stomach suggesting peptic ulcer disease.  Cholelithiasis with no
evidence of bile duct dilatation.

## 2013-03-13 IMAGING — US US ABDOMEN COMPLETE
1 series · 14 of 25 positions shown · non-contrast
Comparison: CT abdomen and pelvis [DATE]

CLINICAL DATA: Abdominal pain

COMPLETE ABDOMINAL ULTRASOUND

[Series 1: us abdomen complete · 0.28mm/px · 14 of 70 slices shown]
[im 1/70]
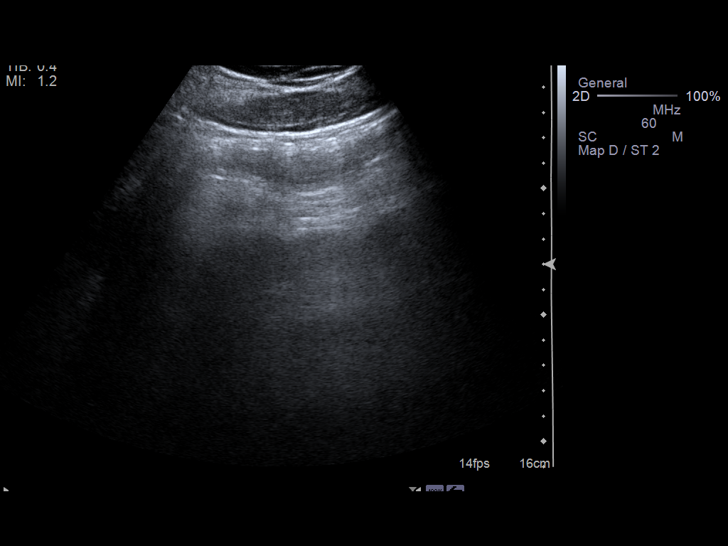
[im 6/70]
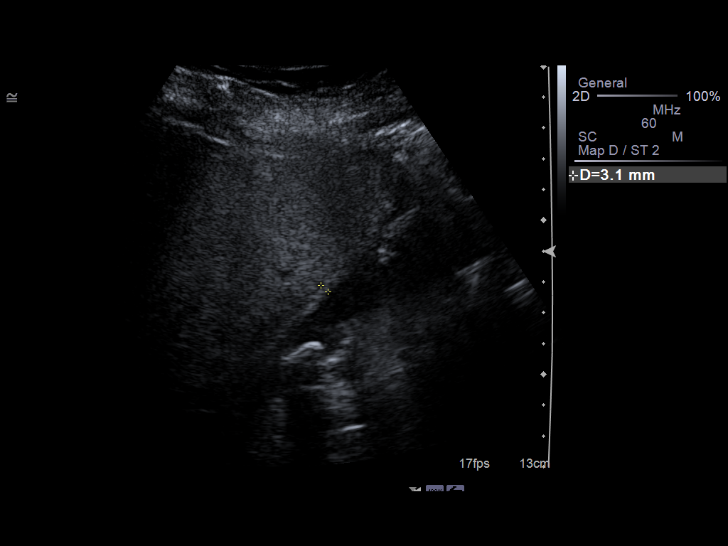
[im 12/70]
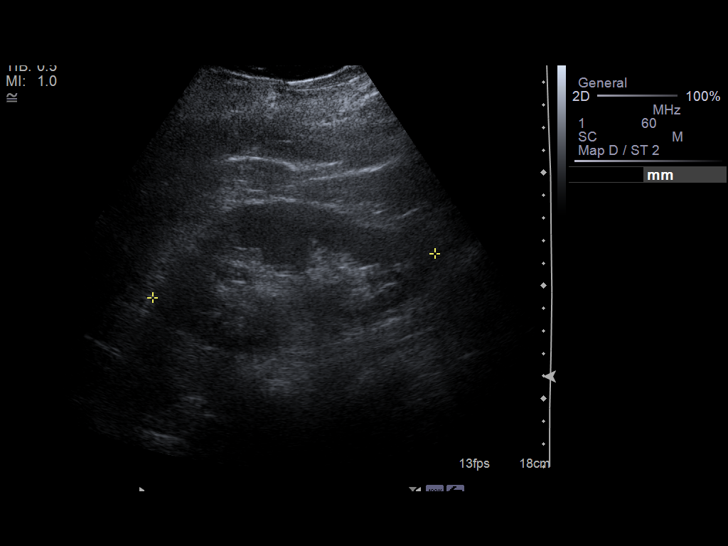
[im 18/70]
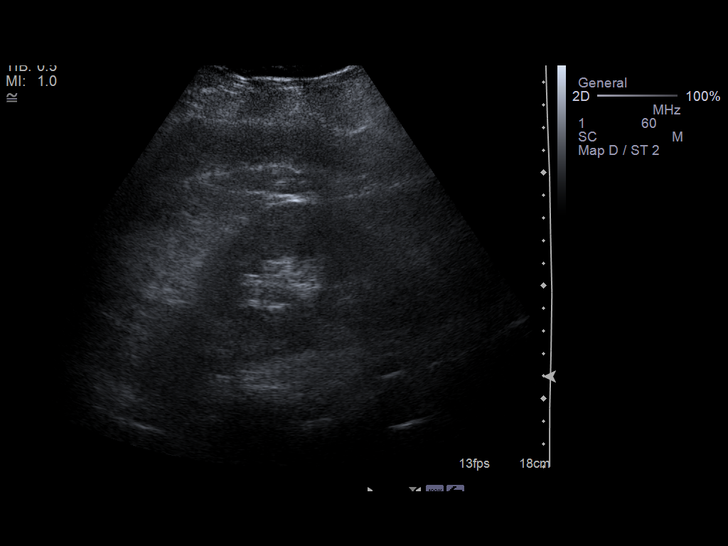
[im 24/70]
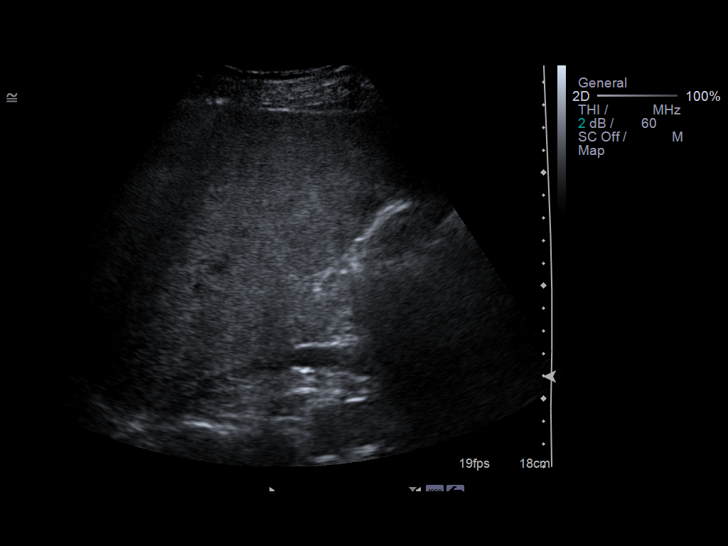
[im 26/70]
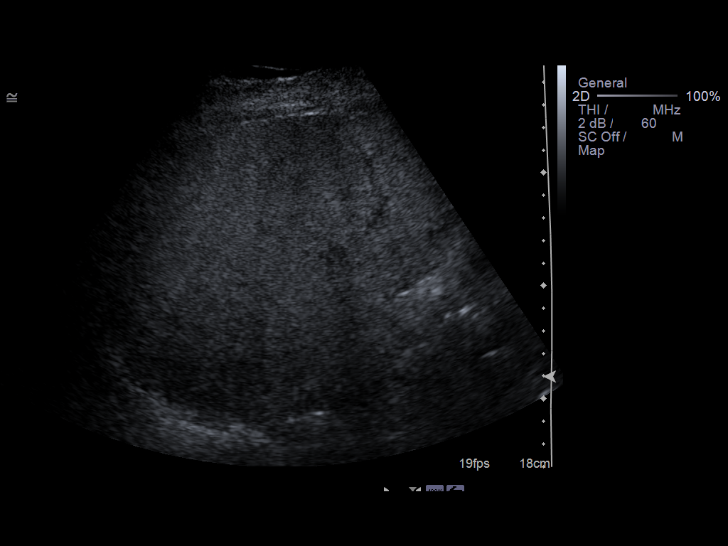
[im 32/70]
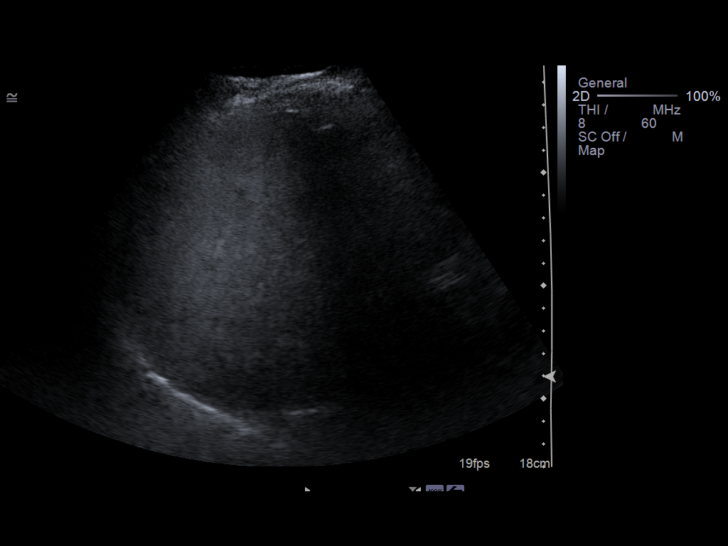
[im 38/70]
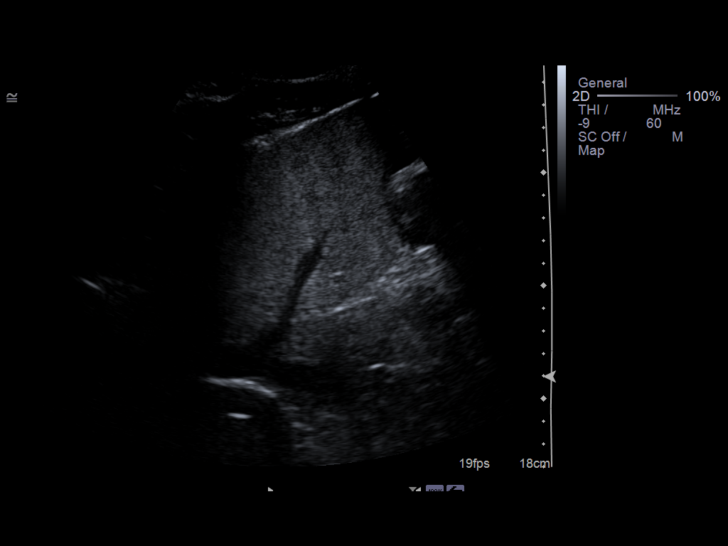
[im 44/70]
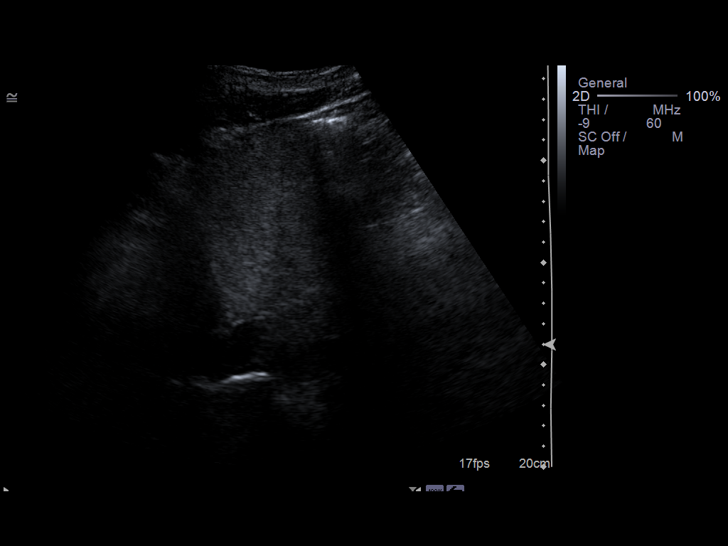
[im 47/70]
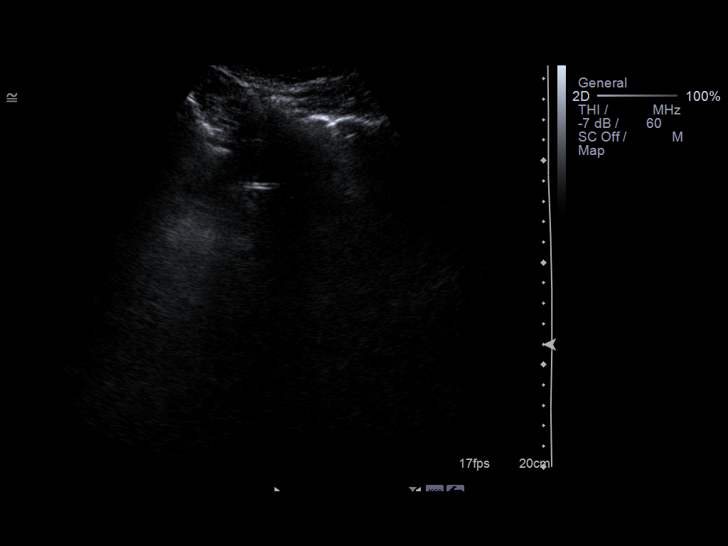
[im 52/70]
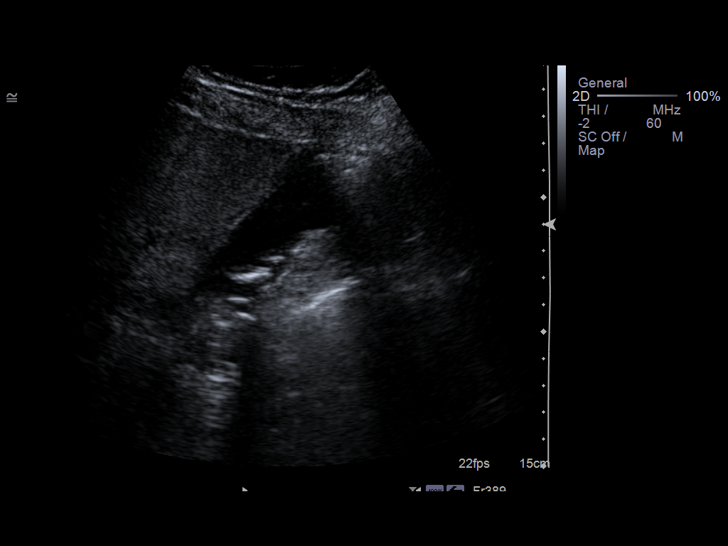
[im 58/70]
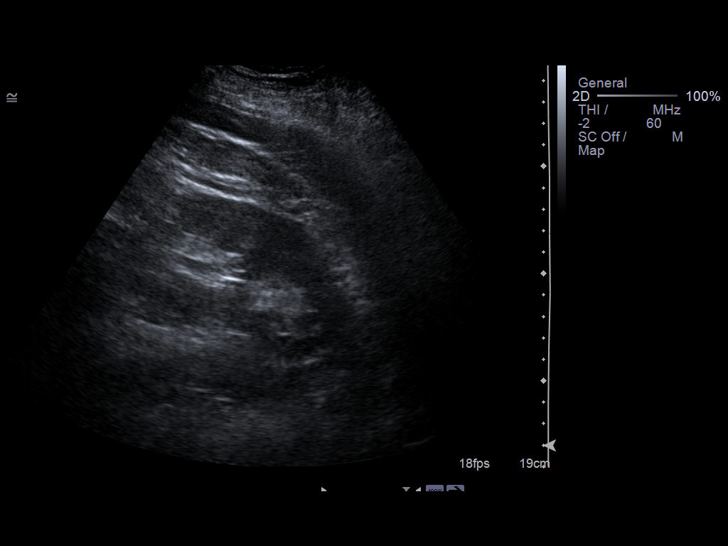
[im 64/70]
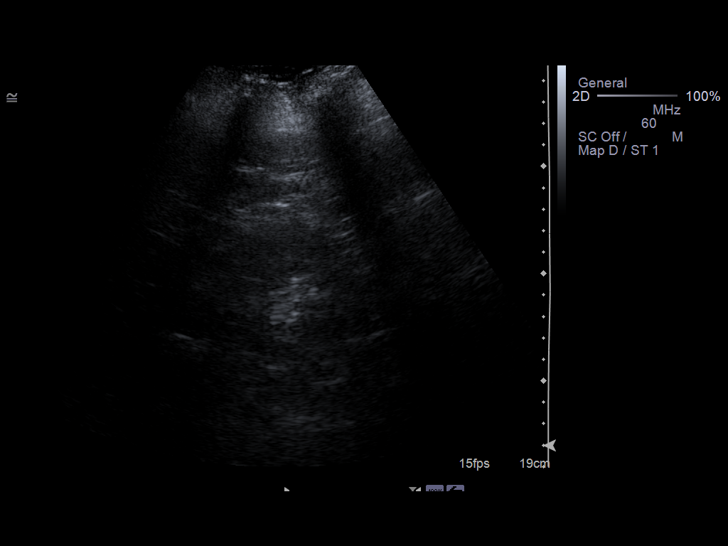
[im 70/70]
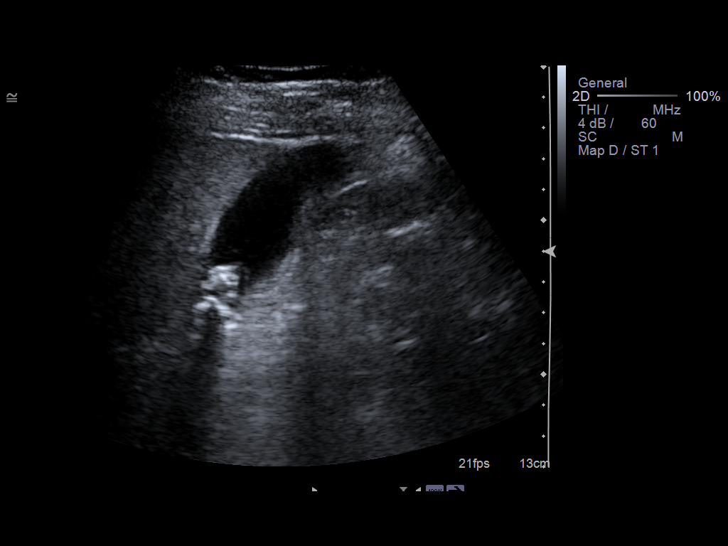

[14 of 25 positions shown; findings below may reference images not displayed]

FINDINGS: Gallbladder:  Cholelithiasis with multiple mobile gallstones.  No
gallbladder wall thickening or edema.  Murphy's sign is negative.

Common bile duct:  Normal caliber measuring 3.7 mm diameter.

Liver:  Diffusely heterogeneous parenchymal echotexture suggesting
fatty infiltration.

IVC:  Not visualized due to overlying bowel gas.

Pancreas:  Not visualized due to overlying bowel gas.

Spleen:  Spleen length measures 9.8 cm.  Normal parenchymal
echotexture.

Right Kidney:  Right kidney measures 12.6 cm length.  No
hydronephrosis.

Left Kidney:  Left kidney measures 14.3 cm length.  No
hydronephrosis.

Abdominal aorta:  No aneurysm identified.
IMPRESSION: Cholelithiasis without additional evidence of cholecystitis.  Fatty
infiltration of the liver.

## 2013-03-13 SURGERY — EGD (ESOPHAGOGASTRODUODENOSCOPY)
Anesthesia: Moderate Sedation

## 2013-03-13 MED ORDER — PAROXETINE HCL 20 MG PO TABS: 20.0000 mg | ORAL_TABLET | ORAL | Status: DC

## 2013-03-13 MED ORDER — OXYCODONE HCL 5 MG PO TABS
5.0000 mg | ORAL_TABLET | ORAL | Status: DC | PRN
  Administered 2013-03-13 – 2013-03-14 (×7): 5 mg via ORAL
  Filled 2013-03-13 (×7): qty 1

## 2013-03-13 MED ORDER — METHOTREXATE (PF) 25 MG/0.4ML ~~LOC~~ SOAJ: 0.8000 mL | SUBCUTANEOUS | Status: DC

## 2013-03-13 MED ORDER — HYDROCODONE-ACETAMINOPHEN 7.5-325 MG PO TABS
1.0000 | ORAL_TABLET | Freq: Four times a day (QID) | ORAL | Status: DC | PRN
  Administered 2013-03-14 – 2013-03-15 (×3): 1 via ORAL
  Filled 2013-03-13 (×3): qty 1

## 2013-03-13 MED ORDER — MIDAZOLAM HCL 5 MG/ML IJ SOLN
INTRAMUSCULAR | Status: AC
  Filled 2013-03-13: qty 2

## 2013-03-13 MED ORDER — SODIUM CHLORIDE 0.9 % IV SOLN
8.0000 mg/h | INTRAVENOUS | Status: DC
  Administered 2013-03-13 – 2013-03-15 (×4): 8 mg/h via INTRAVENOUS
  Filled 2013-03-13 (×9): qty 80

## 2013-03-13 MED ORDER — PAROXETINE HCL 20 MG PO TABS
20.0000 mg | ORAL_TABLET | Freq: Every evening | ORAL | Status: DC | PRN
  Administered 2013-03-14 – 2013-03-15 (×2): 20 mg via ORAL
  Filled 2013-03-13 (×7): qty 1

## 2013-03-13 MED ORDER — FOLIC ACID 800 MCG PO TABS: 800.0000 ug | ORAL_TABLET | Freq: Every day | ORAL | Status: DC

## 2013-03-13 MED ORDER — ATORVASTATIN CALCIUM 20 MG PO TABS
20.0000 mg | ORAL_TABLET | Freq: Every day | ORAL | Status: DC
  Filled 2013-03-13: qty 1

## 2013-03-13 MED ORDER — MIDAZOLAM HCL 10 MG/2ML IJ SOLN
INTRAMUSCULAR | Status: DC | PRN
  Administered 2013-03-13: 2 mg via INTRAVENOUS
  Administered 2013-03-13: 3 mg via INTRAVENOUS

## 2013-03-13 MED ORDER — SODIUM CHLORIDE 0.9 % IV SOLN: INTRAVENOUS | Status: DC

## 2013-03-13 MED ORDER — PANTOPRAZOLE SODIUM 40 MG IV SOLR
40.0000 mg | Freq: Once | INTRAVENOUS | Status: AC
  Administered 2013-03-13: 40 mg via INTRAVENOUS
  Filled 2013-03-13: qty 40

## 2013-03-13 MED ORDER — FENTANYL CITRATE 0.05 MG/ML IJ SOLN
INTRAMUSCULAR | Status: DC | PRN
  Administered 2013-03-13 (×2): 25 ug via INTRAVENOUS

## 2013-03-13 MED ORDER — DULOXETINE HCL 60 MG PO CPEP
60.0000 mg | ORAL_CAPSULE | Freq: Every day | ORAL | Status: DC
  Administered 2013-03-13: 60 mg via ORAL
  Filled 2013-03-13: qty 1

## 2013-03-13 MED ORDER — DULOXETINE HCL 60 MG PO CPEP
60.0000 mg | ORAL_CAPSULE | Freq: Every evening | ORAL | Status: DC | PRN
  Administered 2013-03-14 – 2013-03-15 (×2): 60 mg via ORAL
  Filled 2013-03-13 (×7): qty 1

## 2013-03-13 MED ORDER — DIPHENHYDRAMINE HCL 50 MG/ML IJ SOLN
INTRAMUSCULAR | Status: AC
  Filled 2013-03-13: qty 1

## 2013-03-13 MED ORDER — SIMVASTATIN 40 MG PO TABS
40.0000 mg | ORAL_TABLET | Freq: Every day | ORAL | Status: DC
  Administered 2013-03-13: 40 mg via ORAL
  Filled 2013-03-13 (×3): qty 1

## 2013-03-13 MED ORDER — POTASSIUM CHLORIDE 10 MEQ/100ML IV SOLN
10.0000 meq | Freq: Once | INTRAVENOUS | Status: DC
  Filled 2013-03-13: qty 100

## 2013-03-13 MED ORDER — DIPHENHYDRAMINE HCL 50 MG/ML IJ SOLN
INTRAMUSCULAR | Status: DC | PRN
  Administered 2013-03-13: 25 mg via INTRAVENOUS

## 2013-03-13 MED ORDER — RISPERIDONE 0.5 MG PO TABS
0.5000 mg | ORAL_TABLET | Freq: Two times a day (BID) | ORAL | Status: DC
  Administered 2013-03-13 – 2013-03-16 (×7): 0.5 mg via ORAL
  Filled 2013-03-13 (×8): qty 1

## 2013-03-13 MED ORDER — FOLIC ACID 1 MG PO TABS
1.0000 mg | ORAL_TABLET | Freq: Every day | ORAL | Status: DC
  Administered 2013-03-13 – 2013-03-16 (×4): 1 mg via ORAL
  Filled 2013-03-13 (×4): qty 1

## 2013-03-13 MED ORDER — FENOFIBRATE 160 MG PO TABS
160.0000 mg | ORAL_TABLET | Freq: Every day | ORAL | Status: DC
  Administered 2013-03-14: 160 mg via ORAL
  Filled 2013-03-13 (×2): qty 1

## 2013-03-13 MED ORDER — PANTOPRAZOLE SODIUM 40 MG IV SOLR
40.0000 mg | Freq: Two times a day (BID) | INTRAVENOUS | Status: DC
  Administered 2013-03-13: 40 mg via INTRAVENOUS
  Filled 2013-03-13 (×2): qty 40

## 2013-03-13 MED ORDER — PAROXETINE HCL 20 MG PO TABS
20.0000 mg | ORAL_TABLET | Freq: Every day | ORAL | Status: DC
  Administered 2013-03-13: 20 mg via ORAL
  Filled 2013-03-13: qty 1

## 2013-03-13 MED ORDER — BUTAMBEN-TETRACAINE-BENZOCAINE 2-2-14 % EX AERO
INHALATION_SPRAY | CUTANEOUS | Status: DC | PRN
  Administered 2013-03-13: 2 via TOPICAL

## 2013-03-13 MED ORDER — FENTANYL CITRATE 0.05 MG/ML IJ SOLN
INTRAMUSCULAR | Status: AC
  Filled 2013-03-13: qty 2

## 2013-03-13 MED ORDER — METHOTREXATE SODIUM CHEMO INJECTION 25 MG/ML
20.0000 mg | INTRAMUSCULAR | Status: DC
  Filled 2013-03-13: qty 0.8

## 2013-03-13 MED ORDER — DEXTROSE 5 % IV SOLN
1.0000 g | Freq: Once | INTRAVENOUS | Status: AC
  Administered 2013-03-13: 1 g via INTRAVENOUS
  Filled 2013-03-13: qty 10

## 2013-03-13 MED ORDER — AMITRIPTYLINE HCL 50 MG PO TABS
50.0000 mg | ORAL_TABLET | Freq: Every day | ORAL | Status: DC
  Administered 2013-03-13 – 2013-03-15 (×3): 50 mg via ORAL
  Filled 2013-03-13 (×4): qty 1

## 2013-03-13 MED ORDER — SODIUM CHLORIDE 0.9 % IV SOLN
INTRAVENOUS | Status: DC
  Administered 2013-03-13: 16:00:00 via INTRAVENOUS

## 2013-03-13 MED ORDER — FERROUS SULFATE 325 (65 FE) MG PO TABS
325.0000 mg | ORAL_TABLET | Freq: Every day | ORAL | Status: DC
  Administered 2013-03-13 – 2013-03-16 (×4): 325 mg via ORAL
  Filled 2013-03-13 (×5): qty 1

## 2013-03-13 MED ORDER — IOHEXOL 300 MG/ML  SOLN
100.0000 mL | Freq: Once | INTRAMUSCULAR | Status: AC | PRN
  Administered 2013-03-13: 100 mL via INTRAVENOUS

## 2013-03-13 NOTE — Progress Notes (Signed)
Report given from ED RN, patient in ultrasound.

## 2013-03-13 NOTE — ED Notes (Signed)
Critcal lab result of potassium of 2.3. EDP  Opitz made aware

## 2013-03-13 NOTE — ED Notes (Signed)
Colostomy bag emptied, brown green fluid.

## 2013-03-13 NOTE — H&P (View-Only) (Signed)
Referring Provider: Dr. Samtani Primary Care Physician:  Provider Not In System Primary Gastroenterologist:  UNASSIGNED  Reason for Consultation:  Melena  HPI: Eddie Taylor is a 57 y.o. male who is s/p total colectomy in 2010 (colonoscopy by Dr. Kaplan at that time that showed a colonic stricture) and op report showed right-sided ischemic colitis presenting with the acute onset of severe diffuse abdominal pain that caused him to cry yesterday due to the severity. He had several episodes of black fluid in his ostomy bag yesterday and today. His wife just emptied about 100 cc of black fluid from his ostomy bag. Nauseous with dry heaves. Denies vomiting or hematemesis. Denies dizziness. Denies NSAIDs. No appetite. Hemoccult positive. Hgb 13. CT showed edema and wall thickening surrounding the pyloric channel concerning for an ulcer.    Past Medical History  Diagnosis Date  . Diverticulitis   History of ischemic colitis  Past Surgical History  Procedure Laterality Date  . Colostomy    . Total colectomy  2010    Prior to Admission medications   Medication Sig Start Date End Date Taking? Authorizing Provider  amitriptyline (ELAVIL) 50 MG tablet Take 50 mg by mouth at bedtime.   Yes Historical Provider, MD  Calcium 1200-1000 MG-UNIT CHEW Chew 1 tablet by mouth daily.   Yes Historical Provider, MD  DULoxetine (CYMBALTA) 30 MG capsule Take 60 mg by mouth daily.   Yes Historical Provider, MD  fenofibrate micronized (LOFIBRA) 134 MG capsule Take 134 mg by mouth daily before breakfast.   Yes Historical Provider, MD  ferrous sulfate 325 (65 FE) MG tablet Take 325 mg by mouth daily with breakfast.   Yes Historical Provider, MD  folic acid (FOLVITE) 800 MCG tablet Take 800 mcg by mouth daily.   Yes Historical Provider, MD  Garlic 1000 MG CAPS Take 1,000 mg by mouth daily.   Yes Historical Provider, MD  HYDROcodone-acetaminophen (NORCO) 7.5-325 MG per tablet Take 1 tablet by mouth every 6 (six)  hours as needed for pain.   Yes Historical Provider, MD  Magnesium 250 MG TABS Take 250 mg by mouth daily.   Yes Historical Provider, MD  Methotrexate, PF, 25 MG/0.4ML SOAJ Inject 0.8 mLs into the skin once a week. sundays   Yes Historical Provider, MD  Misc Natural Products (OSTEO BI-FLEX JOINT SHIELD PO) Take 1 tablet by mouth 2 (two) times daily.   Yes Historical Provider, MD  Multiple Vitamin (MULTIVITAMIN WITH MINERALS) TABS tablet Take 1 tablet by mouth daily.   Yes Historical Provider, MD  OMEGA 3 1000 MG CAPS Take 1 capsule by mouth 3 (three) times daily.   Yes Historical Provider, MD  PARoxetine (PAXIL) 20 MG tablet Take 20 mg by mouth every morning.   Yes Historical Provider, MD  POTASSIUM PO Take 1 tablet by mouth daily.   Yes Historical Provider, MD  pravastatin (PRAVACHOL) 20 MG tablet Take 20 mg by mouth at bedtime.   Yes Historical Provider, MD  risperiDONE (RISPERDAL) 0.5 MG tablet Take 0.5 mg by mouth 2 (two) times daily.   Yes Historical Provider, MD  Thiamine HCl (VITAMIN B-1 PO) Take 1 tablet by mouth daily.   Yes Historical Provider, MD  VITAMIN A PO Take 1 capsule by mouth daily.   Yes Historical Provider, MD  Zinc 50 MG CAPS Take 50 mg by mouth daily.   Yes Historical Provider, MD    Scheduled Meds: . sodium chloride   Intravenous STAT  . amitriptyline  50 mg   Oral QHS  . DULoxetine  60 mg Oral Daily  . ferrous sulfate  325 mg Oral Q breakfast  . folic acid  1 mg Oral Daily  . pantoprazole (PROTONIX) IV  40 mg Intravenous Q12H  . PARoxetine  20 mg Oral Daily  . risperiDONE  0.5 mg Oral BID  . simvastatin  40 mg Oral q1800   Continuous Infusions: . sodium chloride 125 mL/hr at 03/12/13 2321  . sodium chloride 50 mL/hr at 03/13/13 0600   PRN Meds:.fentaNYL, HYDROcodone-acetaminophen, oxyCODONE  Allergies as of 03/12/2013 - Review Complete 03/12/2013  Allergen Reaction Noted  . Morphine and related  03/12/2013    History reviewed. No pertinent family  history.  History   Social History  . Marital Status: Single    Spouse Name: N/A    Number of Children: N/A  . Years of Education: N/A   Occupational History  . Not on file.   Social History Main Topics  . Smoking status: Current Every Day Smoker  . Smokeless tobacco: Not on file  . Alcohol Use: Yes  . Drug Use: Yes    Special: Marijuana  . Sexual Activity: Not on file     Comment: "now and then"   Other Topics Concern  . Not on file   Social History Narrative  . No narrative on file    Review of Systems: All negative except as stated above in HPI.  Physical Exam: Vital signs: Filed Vitals:   03/13/13 0735  BP: 130/82  Pulse: 84  Temp: 98.2 F (36.8 C)  Resp: 18     General:   Alert,  Well-developed, well-nourished, pleasant and cooperative in NAD Lungs:  Clear throughout to auscultation.   No wheezes, crackles, or rhonchi. No acute distress. Heart:  Regular rate and rhythm; no murmurs, clicks, rubs,  or gallops. Abdomen: diffusely tender with guarding, soft, nondistended, +BS, ostomy bag intact, midline surgical scars  Rectal:  Deferred  GI:  Lab Results:  Recent Labs  03/12/13 2343 03/13/13 1303  WBC 11.3* 8.4  HGB 13.0 11.5*  HCT 36.4* 32.2*  PLT 272 237   BMET  Recent Labs  03/12/13 2343 03/13/13 1303  NA 136 136  K 3.6 3.6  CL 99 103  CO2 22 23  GLUCOSE 109* 106*  BUN 14 11  CREATININE 0.74 0.85  CALCIUM 11.4* 10.0   LFT  Recent Labs  03/13/13 1303  PROT 6.4  ALBUMIN 3.3*  AST 19  ALT 15  ALKPHOS 34*  BILITOT 0.4   PT/INR  Recent Labs  03/13/13 1303  LABPROT 14.0  INR 1.10     Studies/Results: Us Abdomen Complete  03/13/2013   *RADIOLOGY REPORT*  Clinical Data:  Abdominal pain  COMPLETE ABDOMINAL ULTRASOUND  Comparison:  CT abdomen and pelvis 03/13/2013  Findings:  Gallbladder:  Cholelithiasis with multiple mobile gallstones.  No gallbladder wall thickening or edema.  Murphy's sign is negative.  Common bile duct:   Normal caliber measuring 3.7 mm diameter.  Liver:  Diffusely heterogeneous parenchymal echotexture suggesting fatty infiltration.  IVC:  Not visualized due to overlying bowel gas.  Pancreas:  Not visualized due to overlying bowel gas.  Spleen:  Spleen length measures 9.8 cm.  Normal parenchymal echotexture.  Right Kidney:  Right kidney measures 12.6 cm length.  No hydronephrosis.  Left Kidney:  Left kidney measures 14.3 cm length.  No hydronephrosis.  Abdominal aorta:  No aneurysm identified.  IMPRESSION: Cholelithiasis without additional evidence of cholecystitis.  Fatty infiltration   of the liver.   Original Report Authenticated By: William Stevens, M.D.   Ct Abdomen Pelvis W Contrast  03/13/2013   *RADIOLOGY REPORT*  Clinical Data: All over abdominal pain.  Nausea and vomiting. History of diverticulitis.  CT ABDOMEN AND PELVIS WITH CONTRAST  Technique:  Multidetector CT imaging of the abdomen and pelvis was performed following the standard protocol during bolus administration of intravenous contrast.  Contrast: 100mL OMNIPAQUE IOHEXOL 300 MG/ML  SOLN  Comparison: 11/24/2008  Findings: Small subpleural nodule in the right lung base measuring about 4 mm diameter.  This appears stable since the previous study. Lung bases are otherwise clear.  Diffuse fatty infiltration of the liver.  Cholelithiasis without gallbladder wall thickening.  There is suggestion of wall thickening and edema in the pyloric region of the stomach. Mild surrounding fatty infiltration. Peptic ulcer disease should be considered.  No gastric distension.  There are prominent lymph nodes in the celiac axis and gastrohepatic ligament region measuring up to about 13 mm diameter.  These may be inflammatory. No retroperitoneal lymphadenopathy.  The pancreas, spleen, adrenal glands, kidneys, inferior vena cava, and retroperitoneal lymph nodes are unremarkable.  Calcification of the aorta without aneurysm.  No free fluid or free air in the abdomen.   Small bowel are decompressed.  Right upper quadrant colostomy with decompressed distal colon.  Postoperative changes in the anterior abdominal wall.  Soft tissue nodule in the subcutaneous fat of the right upper quadrant adjacent to the midline measures 15.9 cm likely represents a sebaceous cyst.  Pelvis:  Stool filled rectosigmoid colon without inflammatory change.  Calcification of the prostate gland.  The bladder is decompressed.  No free or loculated pelvic fluid collections.  No significant pelvic lymphadenopathy.  Degenerative changes in the lumbar spine.  IMPRESSION: Edema and surrounding inflammation around the pyloric region of the stomach suggesting peptic ulcer disease.  Cholelithiasis with no evidence of bile duct dilatation.   Original Report Authenticated By: William Stevens, M.D.    Impression/Plan: 57 yo with acute onset of melena and abdominal pain concerning for a bleeding ulcer. Will do EGD today. PPI continuous infusion. Supportive care.    LOS: 1 day   Melainie Krinsky C.  03/13/2013, 2:15 PM   

## 2013-03-13 NOTE — Interval H&P Note (Signed)
History and Physical Interval Note:  03/13/2013 3:20 PM  Eddie Taylor  has presented today for surgery, with the diagnosis of gi bleed  The various methods of treatment have been discussed with the patient and family. After consideration of risks, benefits and other options for treatment, the patient has consented to  Procedure(s): ESOPHAGOGASTRODUODENOSCOPY (EGD) (N/A) as a surgical intervention .  The patient's history has been reviewed, patient examined, no change in status, stable for surgery.  I have reviewed the patient's chart and labs.  Questions were answered to the patient's satisfaction.     Teja Judice C.

## 2013-03-13 NOTE — Consult Note (Signed)
Referring Provider: Dr. Mahala Menghini Primary Care Physician:  Provider Not In System Primary Gastroenterologist:  UNASSIGNED  Reason for Consultation:  Melena  HPI: Eddie Taylor is a 57 y.o. male who is s/p total colectomy in 2010 (colonoscopy by Dr. Arlyce Dice at that time that showed a colonic stricture) and op report showed right-sided ischemic colitis presenting with the acute onset of severe diffuse abdominal pain that caused him to cry yesterday due to the severity. He had several episodes of black fluid in his ostomy bag yesterday and today. His wife just emptied about 100 cc of black fluid from his ostomy bag. Nauseous with dry heaves. Denies vomiting or hematemesis. Denies dizziness. Denies NSAIDs. No appetite. Hemoccult positive. Hgb 13. CT showed edema and wall thickening surrounding the pyloric channel concerning for an ulcer.    Past Medical History  Diagnosis Date  . Diverticulitis   History of ischemic colitis  Past Surgical History  Procedure Laterality Date  . Colostomy    . Total colectomy  2010    Prior to Admission medications   Medication Sig Start Date End Date Taking? Authorizing Provider  amitriptyline (ELAVIL) 50 MG tablet Take 50 mg by mouth at bedtime.   Yes Historical Provider, MD  Calcium 1200-1000 MG-UNIT CHEW Chew 1 tablet by mouth daily.   Yes Historical Provider, MD  DULoxetine (CYMBALTA) 30 MG capsule Take 60 mg by mouth daily.   Yes Historical Provider, MD  fenofibrate micronized (LOFIBRA) 134 MG capsule Take 134 mg by mouth daily before breakfast.   Yes Historical Provider, MD  ferrous sulfate 325 (65 FE) MG tablet Take 325 mg by mouth daily with breakfast.   Yes Historical Provider, MD  folic acid (FOLVITE) 800 MCG tablet Take 800 mcg by mouth daily.   Yes Historical Provider, MD  Garlic 1000 MG CAPS Take 1,000 mg by mouth daily.   Yes Historical Provider, MD  HYDROcodone-acetaminophen (NORCO) 7.5-325 MG per tablet Take 1 tablet by mouth every 6 (six)  hours as needed for pain.   Yes Historical Provider, MD  Magnesium 250 MG TABS Take 250 mg by mouth daily.   Yes Historical Provider, MD  Methotrexate, PF, 25 MG/0.4ML SOAJ Inject 0.8 mLs into the skin once a week. sundays   Yes Historical Provider, MD  Misc Natural Products (OSTEO BI-FLEX JOINT SHIELD PO) Take 1 tablet by mouth 2 (two) times daily.   Yes Historical Provider, MD  Multiple Vitamin (MULTIVITAMIN WITH MINERALS) TABS tablet Take 1 tablet by mouth daily.   Yes Historical Provider, MD  OMEGA 3 1000 MG CAPS Take 1 capsule by mouth 3 (three) times daily.   Yes Historical Provider, MD  PARoxetine (PAXIL) 20 MG tablet Take 20 mg by mouth every morning.   Yes Historical Provider, MD  POTASSIUM PO Take 1 tablet by mouth daily.   Yes Historical Provider, MD  pravastatin (PRAVACHOL) 20 MG tablet Take 20 mg by mouth at bedtime.   Yes Historical Provider, MD  risperiDONE (RISPERDAL) 0.5 MG tablet Take 0.5 mg by mouth 2 (two) times daily.   Yes Historical Provider, MD  Thiamine HCl (VITAMIN B-1 PO) Take 1 tablet by mouth daily.   Yes Historical Provider, MD  VITAMIN A PO Take 1 capsule by mouth daily.   Yes Historical Provider, MD  Zinc 50 MG CAPS Take 50 mg by mouth daily.   Yes Historical Provider, MD    Scheduled Meds: . sodium chloride   Intravenous STAT  . amitriptyline  50 mg  Oral QHS  . DULoxetine  60 mg Oral Daily  . ferrous sulfate  325 mg Oral Q breakfast  . folic acid  1 mg Oral Daily  . pantoprazole (PROTONIX) IV  40 mg Intravenous Q12H  . PARoxetine  20 mg Oral Daily  . risperiDONE  0.5 mg Oral BID  . simvastatin  40 mg Oral q1800   Continuous Infusions: . sodium chloride 125 mL/hr at 03/12/13 2321  . sodium chloride 50 mL/hr at 03/13/13 0600   PRN Meds:.fentaNYL, HYDROcodone-acetaminophen, oxyCODONE  Allergies as of 03/12/2013 - Review Complete 03/12/2013  Allergen Reaction Noted  . Morphine and related  03/12/2013    History reviewed. No pertinent family  history.  History   Social History  . Marital Status: Single    Spouse Name: N/A    Number of Children: N/A  . Years of Education: N/A   Occupational History  . Not on file.   Social History Main Topics  . Smoking status: Current Every Day Smoker  . Smokeless tobacco: Not on file  . Alcohol Use: Yes  . Drug Use: Yes    Special: Marijuana  . Sexual Activity: Not on file     Comment: "now and then"   Other Topics Concern  . Not on file   Social History Narrative  . No narrative on file    Review of Systems: All negative except as stated above in HPI.  Physical Exam: Vital signs: Filed Vitals:   03/13/13 0735  BP: 130/82  Pulse: 84  Temp: 98.2 F (36.8 C)  Resp: 18     General:   Alert,  Well-developed, well-nourished, pleasant and cooperative in NAD Lungs:  Clear throughout to auscultation.   No wheezes, crackles, or rhonchi. No acute distress. Heart:  Regular rate and rhythm; no murmurs, clicks, rubs,  or gallops. Abdomen: diffusely tender with guarding, soft, nondistended, +BS, ostomy bag intact, midline surgical scars  Rectal:  Deferred  GI:  Lab Results:  Recent Labs  03/12/13 2343 03/13/13 1303  WBC 11.3* 8.4  HGB 13.0 11.5*  HCT 36.4* 32.2*  PLT 272 237   BMET  Recent Labs  03/12/13 2343 03/13/13 1303  NA 136 136  K 3.6 3.6  CL 99 103  CO2 22 23  GLUCOSE 109* 106*  BUN 14 11  CREATININE 0.74 0.85  CALCIUM 11.4* 10.0   LFT  Recent Labs  03/13/13 1303  PROT 6.4  ALBUMIN 3.3*  AST 19  ALT 15  ALKPHOS 34*  BILITOT 0.4   PT/INR  Recent Labs  03/13/13 1303  LABPROT 14.0  INR 1.10     Studies/Results: US Abdomen Complete  03/13/2013   *RADIOLOGY REPORT*  Clinical Data:  Abdominal pain  COMPLETE ABDOMINAL ULTRASOUND  Comparison:  CT abdomen and pelvis 03/13/2013  Findings:  Gallbladder:  Cholelithiasis with multiple mobile gallstones.  No gallbladder wall thickening or edema.  Murphy's sign is negative.  Common bile duct:   Normal caliber measuring 3.7 mm diameter.  Liver:  Diffusely heterogeneous parenchymal echotexture suggesting fatty infiltration.  IVC:  Not visualized due to overlying bowel gas.  Pancreas:  Not visualized due to overlying bowel gas.  Spleen:  Spleen length measures 9.8 cm.  Normal parenchymal echotexture.  Right Kidney:  Right kidney measures 12.6 cm length.  No hydronephrosis.  Left Kidney:  Left kidney measures 14.3 cm length.  No hydronephrosis.  Abdominal aorta:  No aneurysm identified.  IMPRESSION: Cholelithiasis without additional evidence of cholecystitis.  Fatty infiltration  of the liver.   Original Report Authenticated By: Burman Nieves, M.D.   Ct Abdomen Pelvis W Contrast  03/13/2013   *RADIOLOGY REPORT*  Clinical Data: All over abdominal pain.  Nausea and vomiting. History of diverticulitis.  CT ABDOMEN AND PELVIS WITH CONTRAST  Technique:  Multidetector CT imaging of the abdomen and pelvis was performed following the standard protocol during bolus administration of intravenous contrast.  Contrast: OMNIPAQUE IOHEXOL 300 MG/ML  SOLN  Comparison: 11/24/2008  Findings: Small subpleural nodule in the right lung base measuring about 4 mm diameter.  This appears stable since the previous study. Lung bases are otherwise clear.  Diffuse fatty infiltration of the liver.  Cholelithiasis without gallbladder wall thickening.  There is suggestion of wall thickening and edema in the pyloric region of the stomach. Mild surrounding fatty infiltration. Peptic ulcer disease should be considered.  No gastric distension.  There are prominent lymph nodes in the celiac axis and gastrohepatic ligament region measuring up to about 13 mm diameter.  These may be inflammatory. No retroperitoneal lymphadenopathy.  The pancreas, spleen, adrenal glands, kidneys, inferior vena cava, and retroperitoneal lymph nodes are unremarkable.  Calcification of the aorta without aneurysm.  No free fluid or free air in the abdomen.   Small bowel are decompressed.  Right upper quadrant colostomy with decompressed distal colon.  Postoperative changes in the anterior abdominal wall.  Soft tissue nodule in the subcutaneous fat of the right upper quadrant adjacent to the midline measures 15.9 cm likely represents a sebaceous cyst.  Pelvis:  Stool filled rectosigmoid colon without inflammatory change.  Calcification of the prostate gland.  The bladder is decompressed.  No free or loculated pelvic fluid collections.  No significant pelvic lymphadenopathy.  Degenerative changes in the lumbar spine.  IMPRESSION: Edema and surrounding inflammation around the pyloric region of the stomach suggesting peptic ulcer disease.  Cholelithiasis with no evidence of bile duct dilatation.   Original Report Authenticated By: Burman Nieves, M.D.    Impression/Plan: 57 yo with acute onset of melena and abdominal pain concerning for a bleeding ulcer. Will do EGD today. PPI continuous infusion. Supportive care.    LOS: 1 day   Makaria Poarch C.  03/13/2013, 2:15 PM

## 2013-03-13 NOTE — Brief Op Note (Signed)
Large ulcer in duodenal bulb with adherent clot. No active bleeding during EGD. If rebleeds will need surgery vs embolization. Dr. Mahala Menghini aware. See endopro for details/recs.

## 2013-03-13 NOTE — Consult Note (Signed)
Reason for Consult:bleeding duodenal ulcer  Referring Physician: Landis Gandy, MD  Eddie Taylor is an 57 y.o. male.  HPI:  Pt is a 57 yo male with several days of worsening upper abdominal pain.  He also started having bloody stools.  He has rheumatoid arthritis and takes narcotics.  He takes aleve if he hurts more, and this is often.  He drinks alcohol only occasionally.  He is not hypotensive and is not having mental status changes.  Dr. Bosie Clos performed EGD today, and he was found to have duodenal ulcer with clot on top of it.    Past Medical History  Diagnosis Date  . Diverticulitis     Past Surgical History  Procedure Laterality Date  . Colostomy    . Colon surgery      History reviewed. No pertinent family history.  Social History:  reports that he has been smoking.  He does not have any smokeless tobacco history on file. He reports that  drinks alcohol. He reports that he uses illicit drugs (Marijuana).  Allergies:  Allergies  Allergen Reactions  . Morphine And Related     "i go beside myself, i dont know. Its not pretty."     Medications: I have reviewed the patient's current medications.  Results for orders placed during the hospital encounter of 03/12/13 (from the past 48 hour(s))  CG4 I-STAT (LACTIC ACID)     Status: None   Collection Time    03/12/13 11:36 PM      Result Value Range   Lactic Acid, Venous 1.20  0.5 - 2.2 mmol/L  CBC     Status: Abnormal   Collection Time    03/12/13 11:43 PM      Result Value Range   WBC 11.3 (*) 4.0 - 10.5 K/uL   RBC 3.83 (*) 4.22 - 5.81 MIL/uL   Hemoglobin 13.0  13.0 - 17.0 g/dL   HCT 46.9 (*) 62.9 - 52.8 %   MCV 95.0  78.0 - 100.0 fL   MCH 33.9  26.0 - 34.0 pg   MCHC 35.7  30.0 - 36.0 g/dL   RDW 41.3  24.4 - 01.0 %   Platelets 272  150 - 400 K/uL  COMPREHENSIVE METABOLIC PANEL     Status: Abnormal   Collection Time    03/12/13 11:43 PM      Result Value Range   Sodium 136  135 - 145 mEq/L   Potassium 3.6  3.5 - 5.1  mEq/L   Chloride 99  96 - 112 mEq/L   CO2 22  19 - 32 mEq/L   Glucose, Bld 109 (*) 70 - 99 mg/dL   BUN 14  6 - 23 mg/dL   Creatinine, Ser 2.72  0.50 - 1.35 mg/dL   Calcium 53.6 (*) 8.4 - 10.5 mg/dL   Total Protein 7.4  6.0 - 8.3 g/dL   Albumin 3.8  3.5 - 5.2 g/dL   AST 23  0 - 37 U/L   ALT 19  0 - 53 U/L   Alkaline Phosphatase 38 (*) 39 - 117 U/L   Total Bilirubin 0.6  0.3 - 1.2 mg/dL   GFR calc non Af Amer >90  >90 mL/min   GFR calc Af Amer >90  >90 mL/min   Comment: (NOTE)     The eGFR has been calculated using the CKD EPI equation.     This calculation has not been validated in all clinical situations.     eGFR's persistently <  90 mL/min signify possible Chronic Kidney     Disease.  LIPASE, BLOOD     Status: None   Collection Time    03/12/13 11:43 PM      Result Value Range   Lipase 24  11 - 59 U/L  URINALYSIS, ROUTINE W REFLEX MICROSCOPIC     Status: Abnormal   Collection Time    03/13/13  1:57 AM      Result Value Range   Color, Urine YELLOW  YELLOW   APPearance TURBID (*) CLEAR   Specific Gravity, Urine 1.014  1.005 - 1.030   pH 8.5 (*) 5.0 - 8.0   Glucose, UA NEGATIVE  NEGATIVE mg/dL   Hgb urine dipstick NEGATIVE  NEGATIVE   Bilirubin Urine NEGATIVE  NEGATIVE   Ketones, ur NEGATIVE  NEGATIVE mg/dL   Protein, ur NEGATIVE  NEGATIVE mg/dL   Urobilinogen, UA 0.2  0.0 - 1.0 mg/dL   Nitrite NEGATIVE  NEGATIVE   Leukocytes, UA NEGATIVE  NEGATIVE  URINE MICROSCOPIC-ADD ON     Status: Abnormal   Collection Time    03/13/13  1:57 AM      Result Value Range   Squamous Epithelial / LPF RARE  RARE   Bacteria, UA MANY (*) RARE   Urine-Other AMORPHOUS MATERIAL PRESENT    OCCULT BLOOD, POC DEVICE     Status: Abnormal   Collection Time    03/13/13  3:23 AM      Result Value Range   Fecal Occult Bld POSITIVE (*) NEGATIVE  GLUCOSE, CAPILLARY     Status: Abnormal   Collection Time    03/13/13  7:42 AM      Result Value Range   Glucose-Capillary 111 (*) 70 - 99 mg/dL    Comment 1 Documented in Chart     Comment 2 Notify RN    CBC WITH DIFFERENTIAL     Status: Abnormal   Collection Time    03/13/13  1:03 PM      Result Value Range   WBC 8.4  4.0 - 10.5 K/uL   RBC 3.34 (*) 4.22 - 5.81 MIL/uL   Hemoglobin 11.5 (*) 13.0 - 17.0 g/dL   HCT 16.1 (*) 09.6 - 04.5 %   MCV 96.4  78.0 - 100.0 fL   MCH 34.4 (*) 26.0 - 34.0 pg   MCHC 35.7  30.0 - 36.0 g/dL   RDW 40.9  81.1 - 91.4 %   Platelets 237  150 - 400 K/uL   Neutrophils Relative % 66  43 - 77 %   Neutro Abs 5.6  1.7 - 7.7 K/uL   Lymphocytes Relative 22  12 - 46 %   Lymphs Abs 1.9  0.7 - 4.0 K/uL   Monocytes Relative 8  3 - 12 %   Monocytes Absolute 0.7  0.1 - 1.0 K/uL   Eosinophils Relative 3  0 - 5 %   Eosinophils Absolute 0.3  0.0 - 0.7 K/uL   Basophils Relative 1  0 - 1 %   Basophils Absolute 0.0  0.0 - 0.1 K/uL  COMPREHENSIVE METABOLIC PANEL     Status: Abnormal   Collection Time    03/13/13  1:03 PM      Result Value Range   Sodium 136  135 - 145 mEq/L   Potassium 3.6  3.5 - 5.1 mEq/L   Chloride 103  96 - 112 mEq/L   CO2 23  19 - 32 mEq/L   Glucose, Bld 106 (*) 70 -  99 mg/dL   BUN 11  6 - 23 mg/dL   Creatinine, Ser 1.61  0.50 - 1.35 mg/dL   Calcium 09.6  8.4 - 04.5 mg/dL   Total Protein 6.4  6.0 - 8.3 g/dL   Albumin 3.3 (*) 3.5 - 5.2 g/dL   AST 19  0 - 37 U/L   ALT 15  0 - 53 U/L   Alkaline Phosphatase 34 (*) 39 - 117 U/L   Total Bilirubin 0.4  0.3 - 1.2 mg/dL   GFR calc non Af Amer >90  >90 mL/min   GFR calc Af Amer >90  >90 mL/min   Comment: (NOTE)     The eGFR has been calculated using the CKD EPI equation.     This calculation has not been validated in all clinical situations.     eGFR's persistently <90 mL/min signify possible Chronic Kidney     Disease.  PROTIME-INR     Status: None   Collection Time    03/13/13  1:03 PM      Result Value Range   Prothrombin Time 14.0  11.6 - 15.2 seconds   INR 1.10  0.00 - 1.49  APTT     Status: None   Collection Time    03/13/13  1:03  PM      Result Value Range   aPTT 30  24 - 37 seconds  TYPE AND SCREEN     Status: None   Collection Time    03/13/13  1:08 PM      Result Value Range   ABO/RH(D) O POS     Antibody Screen NEG     Sample Expiration 03/16/2013      US Abdomen Complete  03/13/2013   *RADIOLOGY REPORT*  Clinical Data:  Abdominal pain  COMPLETE ABDOMINAL ULTRASOUND  Comparison:  CT abdomen and pelvis 03/13/2013  Findings:  Gallbladder:  Cholelithiasis with multiple mobile gallstones.  No gallbladder wall thickening or edema.  Murphy's sign is negative.  Common bile duct:  Normal caliber measuring 3.7 mm diameter.  Liver:  Diffusely heterogeneous parenchymal echotexture suggesting fatty infiltration.  IVC:  Not visualized due to overlying bowel gas.  Pancreas:  Not visualized due to overlying bowel gas.  Spleen:  Spleen length measures 9.8 cm.  Normal parenchymal echotexture.  Right Kidney:  Right kidney measures 12.6 cm length.  No hydronephrosis.  Left Kidney:  Left kidney measures 14.3 cm length.  No hydronephrosis.  Abdominal aorta:  No aneurysm identified.  IMPRESSION: Cholelithiasis without additional evidence of cholecystitis.  Fatty infiltration of the liver.   Original Report Authenticated By: Burman Nieves, M.D.   Ct Abdomen Pelvis W Contrast  03/13/2013   *RADIOLOGY REPORT*  Clinical Data: All over abdominal pain.  Nausea and vomiting. History of diverticulitis.  CT ABDOMEN AND PELVIS WITH CONTRAST  Technique:  Multidetector CT imaging of the abdomen and pelvis was performed following the standard protocol during bolus administration of intravenous contrast.  Contrast: OMNIPAQUE IOHEXOL 300 MG/ML  SOLN  Comparison: 11/24/2008  Findings: Small subpleural nodule in the right lung base measuring about 4 mm diameter.  This appears stable since the previous study. Lung bases are otherwise clear.  Diffuse fatty infiltration of the liver.  Cholelithiasis without gallbladder wall thickening.  There is  suggestion of wall thickening and edema in the pyloric region of the stomach. Mild surrounding fatty infiltration. Peptic ulcer disease should be considered.  No gastric distension.  There are prominent lymph nodes in the celiac  axis and gastrohepatic ligament region measuring up to about 13 mm diameter.  These may be inflammatory. No retroperitoneal lymphadenopathy.  The pancreas, spleen, adrenal glands, kidneys, inferior vena cava, and retroperitoneal lymph nodes are unremarkable.  Calcification of the aorta without aneurysm.  No free fluid or free air in the abdomen.  Small bowel are decompressed.  Right upper quadrant colostomy with decompressed distal colon.  Postoperative changes in the anterior abdominal wall.  Soft tissue nodule in the subcutaneous fat of the right upper quadrant adjacent to the midline measures 15.9 cm likely represents a sebaceous cyst.  Pelvis:  Stool filled rectosigmoid colon without inflammatory change.  Calcification of the prostate gland.  The bladder is decompressed.  No free or loculated pelvic fluid collections.  No significant pelvic lymphadenopathy.  Degenerative changes in the lumbar spine.  IMPRESSION: Edema and surrounding inflammation around the pyloric region of the stomach suggesting peptic ulcer disease.  Cholelithiasis with no evidence of bile duct dilatation.   Original Report Authenticated By: Burman Nieves, M.D.    Review of Systems  Constitutional: Negative.   HENT: Negative.   Eyes: Negative.   Respiratory: Negative.   Cardiovascular: Negative.   Gastrointestinal: Positive for abdominal pain and blood in stool.  Genitourinary: Negative.   Musculoskeletal: Positive for back pain and joint pain.  Skin: Negative.   Neurological: Negative.   Endo/Heme/Allergies: Negative.   Psychiatric/Behavioral: Negative.    Blood pressure 104/57, pulse 84, temperature 98.2 F (36.8 C), temperature source Oral, resp. rate 19, height 6' (1.829 m), weight 208 lb 12.4  oz (94.7 kg), SpO2 94.00%. Physical Exam  Constitutional: He is oriented to person, place, and time. He appears well-developed and well-nourished. No distress.  HENT:  Head: Normocephalic and atraumatic.  Eyes: Pupils are equal, round, and reactive to light. Right eye exhibits no discharge. Left eye exhibits no discharge. No scleral icterus.  Neck: Normal range of motion. No tracheal deviation present. No thyromegaly present.  Cardiovascular: Normal rate, regular rhythm, normal heart sounds and intact distal pulses.  Exam reveals no gallop and no friction rub.   No murmur heard. Respiratory: Effort normal and breath sounds normal. No respiratory distress. He has no wheezes. He has no rales. He exhibits no tenderness.  GI: Soft. He exhibits no distension and no mass. There is tenderness (mild epigastric tenderness). There is no rebound and no guarding.  Neurological: He is alert and oriented to person, place, and time. Coordination normal.  Skin: Skin is warm and dry. No rash noted. He is not diaphoretic. No erythema. There is pallor.  Psychiatric: He has a normal mood and affect. His behavior is normal. Judgment and thought content normal.    Assessment/Plan: Duodenal ulcer, bleeding. Protonix gtt NPO Monitor serial H&H Transfuse if needed Recommend embolization if rebleeds. Would leave surgery as last option.    Aliahna Statzer 03/13/2013, 5:40 PM

## 2013-03-13 NOTE — Op Note (Signed)
[facility] [address] [place], [address]   ENDOSCOPY PROCEDURE REPORT  PATIENT: Eddie Taylor, Eddie Taylor  MR#: [id number] BIRTHDATE: October 15, 1955 , 57  yrs. old GENDER: Male  ENDOSCOPIST: Charlott Rakes, MD REFERRED [id number] team  PROCEDURE DATE:  03/13/2013 PROCEDURE:   EGD, diagnostic ASA CLASS:   Class II INDICATIONS:Melena. MEDICATIONS: Fentanyl 50 mcg IV, Versed 5 mg IV, Diphenhydramine (Benadryl) 25 mg IV, and Cetacaine spray x 2  TOPICAL ANESTHETIC:  DESCRIPTION OF PROCEDURE:   After the risks benefits and alternatives of the procedure were thoroughly explained, informed consent was obtained.  The Pentax Gastroscope [id number]  endoscope was introduced through the mouth and advanced to the second portion of the duodenum , limited by Without limitations.   The instrument was slowly withdrawn as the mucosa was fully examined.     FINDINGS: The endoscope was inserted into the oropharynx and esophagus was intubated.  The gastroesophageal junction and esophagus were normal in appearance.  Endoscope was advanced into the stomach, which revealed scattered antral ulcerations and edema. Upon intubation of the duodenal bulb was a massive cratered ulcer with black and red eschar that covered half of the bulb's circumference. Significant surrounding edema seen. Ulcer was irrigated and no active bleeding occurred. Unable to dislodge what appeared to be an adherent clot so unable to see if a visible vessel was present.   The endoscope was carefully advanced to the second portion of duodenum which was unremarkable.  The endoscope was withdrawn back into the stomach and retroflexion revealed a normal proximal stomach.  COMPLICATIONS: None  ENDOSCOPIC IMPRESSION:     Massive duodenal bulb ulcer with adherent clot (NO active bleeding and no other blood products seen on EGD). Not amenable to endoscopic therapy Small gastric ulcers  RECOMMENDATIONS:  If rebleeding occurs will need embolization vs. surgery; Protonix infusion; NPO; Follow H/Hs closely   REPEAT EXAM: N/A  _______________________________ Charlott Rakes, MD eSigned:  Charlott Rakes, MD 03/13/2013 3:46 PM    CC:  PATIENT NAME:  Tia Alert MR#: [id number]

## 2013-03-13 NOTE — ED Provider Notes (Signed)
CSN: [id number]     Arrival date & time 03/12/13  2306 History     First MD Initiated Contact with Patient 03/12/13 2316     Chief Complaint  Patient presents with  . Abdominal Pain   (Consider location/radiation/quality/duration/timing/severity/associated sxs/prior Treatment) HPI History provided by the patient. ABD pain onset this am worsening throughout the day with associated nausea no emesis. Severe sharp pain periumbilical.  He has a colostomy with h/o ruptured diverticulum 2010.  He is passing black stool today. No F/C. No radiation of pain.   Past Medical History  Diagnosis Date  . Diverticulitis    Past Surgical History  Procedure Laterality Date  . Colostomy    . Colon surgery     History reviewed. No pertinent family history. History  Substance Use Topics  . Smoking status: Current Every Day Smoker  . Smokeless tobacco: Not on file  . Alcohol Use: Yes    Review of Systems  Constitutional: Negative for fever and chills.  HENT: Negative for neck pain.   Respiratory: Negative for shortness of breath.   Cardiovascular: Negative for chest pain.  Gastrointestinal: Positive for abdominal pain. Negative for vomiting.  Genitourinary: Negative for flank pain.  Musculoskeletal: Negative for back pain.  Skin: Negative for rash.  Neurological: Negative for headaches.  All other systems reviewed and are negative.    Allergies  Morphine and related  Home Medications   Current Outpatient Rx  Name  Route  Sig  Dispense  Refill  . amitriptyline (ELAVIL) 50 MG tablet   Oral   Take 50 mg by mouth at bedtime.         . Calcium 1200-1000 MG-UNIT CHEW   Oral   Chew 1 tablet by mouth daily.         . DULoxetine (CYMBALTA) 30 MG capsule   Oral   Take 60 mg by mouth daily.         . fenofibrate micronized (LOFIBRA) 134 MG capsule   Oral   Take 134 mg by mouth daily before breakfast.         . ferrous sulfate 325 (65 FE) MG tablet   Oral   Take 325 mg by  mouth daily with breakfast.         . folic acid (FOLVITE) 800 MCG tablet   Oral   Take 800 mcg by mouth daily.         . Garlic 1000 MG CAPS   Oral   Take 1,000 mg by mouth daily.         Marland Kitchen HYDROcodone-acetaminophen (NORCO) 7.5-325 MG per tablet   Oral   Take 1 tablet by mouth every 6 (six) hours as needed for pain.         . Magnesium 250 MG TABS   Oral   Take 250 mg by mouth daily.         . Methotrexate, PF, 25 MG/0.4ML SOAJ   Subcutaneous   Inject 0.8 mLs into the skin once a week. sundays         . Misc Natural Products (OSTEO BI-FLEX JOINT SHIELD PO)   Oral   Take 1 tablet by mouth 2 (two) times daily.         . Multiple Vitamin (MULTIVITAMIN WITH MINERALS) TABS tablet   Oral   Take 1 tablet by mouth daily.         . OMEGA 3 1000 MG CAPS   Oral   Take 1 capsule by  mouth 3 (three) times daily.         Marland Kitchen PARoxetine (PAXIL) 20 MG tablet   Oral   Take 20 mg by mouth every morning.         Marland Kitchen POTASSIUM PO   Oral   Take 1 tablet by mouth daily.         . pravastatin (PRAVACHOL) 20 MG tablet   Oral   Take 20 mg by mouth at bedtime.         . risperiDONE (RISPERDAL) 0.5 MG tablet   Oral   Take 0.5 mg by mouth 2 (two) times daily.         . Thiamine HCl (VITAMIN B-1 PO)   Oral   Take 1 tablet by mouth daily.         Marland Kitchen VITAMIN A PO   Oral   Take 1 capsule by mouth daily.         . Zinc 50 MG CAPS   Oral   Take 50 mg by mouth daily.          BP 148/105  Pulse 80  Temp(Src) 98.2 F (36.8 C) (Oral)  Resp 18  SpO2 99% Physical Exam  Nursing note and vitals reviewed. Constitutional: He is oriented to person, place, and time. He appears well-developed and well-nourished.  HENT:  Head: Normocephalic and atraumatic.  Eyes: EOM are normal. Pupils are equal, round, and reactive to light. No scleral icterus.  Neck: Neck supple.  Cardiovascular: Normal rate, regular rhythm and intact distal pulses.   Pulmonary/Chest: Effort  normal and breath sounds normal. No respiratory distress. He exhibits no tenderness.  Abdominal:  Tender diffusely more so periumbilical with guarding. Colostomy in place  Musculoskeletal: Normal range of motion. He exhibits no edema.  Neurological: He is alert and oriented to person, place, and time.  Skin: Skin is warm and dry.    ED Course   Procedures (including critical care time)  Results for orders placed during the hospital encounter of 03/12/13  CBC      Result Value Range   WBC 11.3 (*) 4.0 - 10.5 K/uL   RBC 3.83 (*) 4.22 - 5.81 MIL/uL   Hemoglobin 13.0  13.0 - 17.0 g/dL   HCT 52.8 (*) 41.3 - 24.4 %   MCV 95.0  78.0 - 100.0 fL   MCH 33.9  26.0 - 34.0 pg   MCHC 35.7  30.0 - 36.0 g/dL   RDW 01.0  27.2 - 53.6 %   Platelets 272  150 - 400 K/uL  COMPREHENSIVE METABOLIC PANEL      Result Value Range   Sodium 136  135 - 145 mEq/L   Potassium 3.6  3.5 - 5.1 mEq/L   Chloride 99  96 - 112 mEq/L   CO2 22  19 - 32 mEq/L   Glucose, Bld 109 (*) 70 - 99 mg/dL   BUN 14  6 - 23 mg/dL   Creatinine, Ser 6.44  0.50 - 1.35 mg/dL   Calcium 03.4 (*) 8.4 - 10.5 mg/dL   Total Protein 7.4  6.0 - 8.3 g/dL   Albumin 3.8  3.5 - 5.2 g/dL   AST 23  0 - 37 U/L   ALT 19  0 - 53 U/L   Alkaline Phosphatase 38 (*) 39 - 117 U/L   Total Bilirubin 0.6  0.3 - 1.2 mg/dL   GFR calc non Af Amer >90  >90 mL/min   GFR calc Af Amer >90  >  90 mL/min  LIPASE, BLOOD      Result Value Range   Lipase 24  11 - 59 U/L  URINALYSIS, ROUTINE W REFLEX MICROSCOPIC      Result Value Range   Color, Urine YELLOW  YELLOW   APPearance TURBID (*) CLEAR   Specific Gravity, Urine 1.014  1.005 - 1.030   pH 8.5 (*) 5.0 - 8.0   Glucose, UA NEGATIVE  NEGATIVE mg/dL   Hgb urine dipstick NEGATIVE  NEGATIVE   Bilirubin Urine NEGATIVE  NEGATIVE   Ketones, ur NEGATIVE  NEGATIVE mg/dL   Protein, ur NEGATIVE  NEGATIVE mg/dL   Urobilinogen, UA 0.2  0.0 - 1.0 mg/dL   Nitrite NEGATIVE  NEGATIVE   Leukocytes, UA NEGATIVE   NEGATIVE  URINE MICROSCOPIC-ADD ON      Result Value Range   Squamous Epithelial / LPF RARE  RARE   Bacteria, UA MANY (*) RARE   Urine-Other AMORPHOUS MATERIAL PRESENT    CG4 I-STAT (LACTIC ACID)      Result Value Range   Lactic Acid, Venous 1.20  0.5 - 2.2 mmol/L  OCCULT BLOOD, POC DEVICE      Result Value Range   Fecal Occult Bld POSITIVE (*) NEGATIVE   Ct Abdomen Pelvis W Contrast  03/13/2013   *RADIOLOGY REPORT*  Clinical Data: All over abdominal pain.  Nausea and vomiting. History of diverticulitis.  CT ABDOMEN AND PELVIS WITH CONTRAST  Technique:  Multidetector CT imaging of the abdomen and pelvis was performed following the standard protocol during bolus administration of intravenous contrast.  Contrast: OMNIPAQUE IOHEXOL 300 MG/ML  SOLN  Comparison: 11/24/2008  Findings: Small subpleural nodule in the right lung base measuring about 4 mm diameter.  This appears stable since the previous study. Lung bases are otherwise clear.  Diffuse fatty infiltration of the liver.  Cholelithiasis without gallbladder wall thickening.  There is suggestion of wall thickening and edema in the pyloric region of the stomach. Mild surrounding fatty infiltration. Peptic ulcer disease should be considered.  No gastric distension.  There are prominent lymph nodes in the celiac axis and gastrohepatic ligament region measuring up to about 13 mm diameter.  These may be inflammatory. No retroperitoneal lymphadenopathy.  The pancreas, spleen, adrenal glands, kidneys, inferior vena cava, and retroperitoneal lymph nodes are unremarkable.  Calcification of the aorta without aneurysm.  No free fluid or free air in the abdomen.  Small bowel are decompressed.  Right upper quadrant colostomy with decompressed distal colon.  Postoperative changes in the anterior abdominal wall.  Soft tissue nodule in the subcutaneous fat of the right upper quadrant adjacent to the midline measures 15.9 cm likely represents a sebaceous cyst.   Pelvis:  Stool filled rectosigmoid colon without inflammatory change.  Calcification of the prostate gland.  The bladder is decompressed.  No free or loculated pelvic fluid collections.  No significant pelvic lymphadenopathy.  Degenerative changes in the lumbar spine.  IMPRESSION: Edema and surrounding inflammation around the pyloric region of the stomach suggesting peptic ulcer disease.  Cholelithiasis with no evidence of bile duct dilatation.   Original Report Authenticated By: Burman Nieves, M.D.    IVFs, IV narcotics, NPO  3:40 AM still having 7/10 abdominal pain. Medicine consult for admission. D/w DR Allena Katz. [place] pending for gallstone, neg Murphys sign and not especially tender over RUQ  MDM  Abdominal pain with black stools guaiac positive and CT scan concerning for possible peptic ulcer disease. Labs obtained and reviewed. IV narcotics - persistent abdominal  pain.   Medical admit  Sunnie Nielsen, MD 03/13/13 [phone]

## 2013-03-13 NOTE — ED Notes (Signed)
Charting error - disregard potassium level of 2.3. EDP Opitz made aware.

## 2013-03-13 NOTE — H&P (Signed)
[facility] History and Physical  Patient: Eddie Taylor  ZOX:[id number]  DOB: July 03, 1956  DOA: 03/12/2013  Referring physician: Dr Theodoro Kalata PCP: Provider Not In System   Chief Complaint: abdominal pain  HPI: [name] is a 57 y.o. male with Past medical history of diverticulitis s/p colostomy presented with severe abdominal pain that started at around 8 pm. He says he has been having a feeling of upset stomach since the AM and due to that has lost his appetite. Felt nauseous through out the day which got worse before the pain started. Had one episode of dry hives when he reached to ED. There was no blood in it. He still remains in similar pain. Nausea is better. The abdominal pain is sharp and diffuse, continuous and non radiating. No back pain or CVAT. He has h/o renal stone and mentions this pain is different. He denies any urinary symptoms. although he has some chronic GI disturbances that he felt like indigestion he denies any chronic pain that occurs after eating or a meal. He also has noted black color BM from his colostomy bag. Although his stool here is green-brown and only occult blood positive.  Review of Systems: as mentioned in the history of present illness.  A Comprehensive review of the other systems is negative.  Past Medical History  Diagnosis Date  . Diverticulitis    Past Surgical History  Procedure Laterality Date  . Colostomy    . Colon surgery     Social History:  reports that he has been smoking.  He does not have any smokeless tobacco history on file. He reports that  drinks alcohol. He reports that he uses illicit drugs (Marijuana). Patient is coming from home. Independent for most of his  ADL.  Allergies  Allergen Reactions  . Morphine And Related     "i go beside myself, i dont know. Its not pretty."     History reviewed. No pertinent family history.  Prior to Admission medications   Medication Sig Start Date End Date Taking? Authorizing  Provider  amitriptyline (ELAVIL) 50 MG tablet Take 50 mg by mouth at bedtime.   Yes Historical Provider, MD  Calcium 1200-1000 MG-UNIT CHEW Chew 1 tablet by mouth daily.   Yes Historical Provider, MD  DULoxetine (CYMBALTA) 30 MG capsule Take 60 mg by mouth daily.   Yes Historical Provider, MD  fenofibrate micronized (LOFIBRA) 134 MG capsule Take 134 mg by mouth daily before breakfast.   Yes Historical Provider, MD  ferrous sulfate 325 (65 FE) MG tablet Take 325 mg by mouth daily with breakfast.   Yes Historical Provider, MD  folic acid (FOLVITE) 800 MCG tablet Take 800 mcg by mouth daily.   Yes Historical Provider, MD  Garlic 1000 MG CAPS Take 1,000 mg by mouth daily.   Yes Historical Provider, MD  HYDROcodone-acetaminophen (NORCO) 7.5-325 MG per tablet Take 1 tablet by mouth every 6 (six) hours as needed for pain.   Yes Historical Provider, MD  Magnesium 250 MG TABS Take 250 mg by mouth daily.   Yes Historical Provider, MD  Methotrexate, PF, 25 MG/0.4ML SOAJ Inject 0.8 mLs into the skin once a week. sundays   Yes Historical Provider, MD  Misc Natural Products (OSTEO BI-FLEX JOINT SHIELD PO) Take 1 tablet by mouth 2 (two) times daily.   Yes Historical Provider, MD  Multiple Vitamin (MULTIVITAMIN WITH MINERALS) TABS tablet Take 1 tablet by mouth daily.   Yes Historical Provider, MD  OMEGA 3 1000  MG CAPS Take 1 capsule by mouth 3 (three) times daily.   Yes Historical Provider, MD  PARoxetine (PAXIL) 20 MG tablet Take 20 mg by mouth every morning.   Yes Historical Provider, MD  POTASSIUM PO Take 1 tablet by mouth daily.   Yes Historical Provider, MD  pravastatin (PRAVACHOL) 20 MG tablet Take 20 mg by mouth at bedtime.   Yes Historical Provider, MD  risperiDONE (RISPERDAL) 0.5 MG tablet Take 0.5 mg by mouth 2 (two) times daily.   Yes Historical Provider, MD  Thiamine HCl (VITAMIN B-1 PO) Take 1 tablet by mouth daily.   Yes Historical Provider, MD  VITAMIN A PO Take 1 capsule by mouth daily.   Yes  Historical Provider, MD  Zinc 50 MG CAPS Take 50 mg by mouth daily.   Yes Historical Provider, MD    Physical Exam: Filed Vitals:   03/13/13 0100 03/13/13 0130 03/13/13 0546 03/13/13 0551  BP: 164/106 141/98  146/84  Pulse: 87 98  72  Temp:    98.3 F (36.8 C)  TempSrc:    Oral  Resp:    18  Height:   6' (1.829 m)   Weight:   94.7 kg (208 lb 12.4 oz)   SpO2: 93% 95%  97%    General: Alert, Awake and Oriented to Time, Place and Person. Appear in mild distress Eyes: PERRL ENT: Oral Mucosa clear moist. Neck: no JVD, no Carotid Bruits  Cardiovascular: S1 and S2 Present, no Murmur, Peripheral Pulses Present Respiratory: Bilateral Air entry equal and Decreased, Clear to Auscultation,  No  Crackles,no wheezes Abdomen: Bowel Sound Present, Soft and diffusely tender, no guarding or rigidity, on palpation with stethoscope the pain is less in intensity.  Skin: no Rash Extremities: no Pedal edema, no calf tenderness Neurologic: Grossly Unremarkable.  Labs on Admission:  CBC:  Recent Labs Lab 03/12/13 2343  WBC 11.3*  HGB 13.0  HCT 36.4*  MCV 95.0  PLT 272    CMP     Component Value Date/Time   NA 136 03/12/2013 2343   K 3.6 03/12/2013 2343   CL 99 03/12/2013 2343   CO2 22 03/12/2013 2343   GLUCOSE 109* 03/12/2013 2343   BUN 14 03/12/2013 2343   CREATININE 0.74 03/12/2013 2343   CALCIUM 11.4* 03/12/2013 2343   PROT 7.4 03/12/2013 2343   ALBUMIN 3.8 03/12/2013 2343   AST 23 03/12/2013 2343   ALT 19 03/12/2013 2343   ALKPHOS 38* 03/12/2013 2343   BILITOT 0.6 03/12/2013 2343   GFRNONAA >90 03/12/2013 2343   GFRAA >90 03/12/2013 2343     Recent Labs Lab 03/12/13 2343  LIPASE 24   No results found for this basename: AMMONIA,  in the last 168 hours  Cardiac Enzymes: No results found for this basename: CKTOTAL, CKMB, CKMBINDEX, TROPONINI,  in the last 168 hours  BNP (last 3 results) No results found for this basename: PROBNP,  in the last 8760 hours  Radiological Exams on  Admission: US Abdomen Complete  03/13/2013   *RADIOLOGY REPORT*  Clinical Data:  Abdominal pain  COMPLETE ABDOMINAL ULTRASOUND  Comparison:  CT abdomen and pelvis 03/13/2013  Findings:  Gallbladder:  Cholelithiasis with multiple mobile gallstones.  No gallbladder wall thickening or edema.  Murphy's sign is negative.  Common bile duct:  Normal caliber measuring 3.7 mm diameter.  Liver:  Diffusely heterogeneous parenchymal echotexture suggesting fatty infiltration.  IVC:  Not visualized due to overlying bowel gas.  Pancreas:  Not visualized due  to overlying bowel gas.  Spleen:  Spleen length measures 9.8 cm.  Normal parenchymal echotexture.  Right Kidney:  Right kidney measures 12.6 cm length.  No hydronephrosis.  Left Kidney:  Left kidney measures 14.3 cm length.  No hydronephrosis.  Abdominal aorta:  No aneurysm identified.  IMPRESSION: Cholelithiasis without additional evidence of cholecystitis.  Fatty infiltration of the liver.   Original Report Authenticated By: Burman Nieves, M.D.   Ct Abdomen Pelvis W Contrast  03/13/2013   *RADIOLOGY REPORT*  Clinical Data: All over abdominal pain.  Nausea and vomiting. History of diverticulitis.  CT ABDOMEN AND PELVIS WITH CONTRAST  Technique:  Multidetector CT imaging of the abdomen and pelvis was performed following the standard protocol during bolus administration of intravenous contrast.  Contrast: OMNIPAQUE IOHEXOL 300 MG/ML  SOLN  Comparison: 11/24/2008  Findings: Small subpleural nodule in the right lung base measuring about 4 mm diameter.  This appears stable since the previous study. Lung bases are otherwise clear.  Diffuse fatty infiltration of the liver.  Cholelithiasis without gallbladder wall thickening.  There is suggestion of wall thickening and edema in the pyloric region of the stomach. Mild surrounding fatty infiltration. Peptic ulcer disease should be considered.  No gastric distension.  There are prominent lymph nodes in the celiac axis and  gastrohepatic ligament region measuring up to about 13 mm diameter.  These may be inflammatory. No retroperitoneal lymphadenopathy.  The pancreas, spleen, adrenal glands, kidneys, inferior vena cava, and retroperitoneal lymph nodes are unremarkable.  Calcification of the aorta without aneurysm.  No free fluid or free air in the abdomen.  Small bowel are decompressed.  Right upper quadrant colostomy with decompressed distal colon.  Postoperative changes in the anterior abdominal wall.  Soft tissue nodule in the subcutaneous fat of the right upper quadrant adjacent to the midline measures 15.9 cm likely represents a sebaceous cyst.  Pelvis:  Stool filled rectosigmoid colon without inflammatory change.  Calcification of the prostate gland.  The bladder is decompressed.  No free or loculated pelvic fluid collections.  No significant pelvic lymphadenopathy.  Degenerative changes in the lumbar spine.  IMPRESSION: Edema and surrounding inflammation around the pyloric region of the stomach suggesting peptic ulcer disease.  Cholelithiasis with no evidence of bile duct dilatation.   Original Report Authenticated By: Burman Nieves, M.D.   Assessment/Plan Principal Problem:   Abdominal pain, unspecified site Active Problems:   Peptic ulcer disease   Cholelithiases   1. Abdominal pain, unspecified site The pt has diffuse abdominal pain, of suddn onset. At present lipase amylase, lactic acid are normal and CT of the Abdomen is negative for any obstruction, or ischemia or infarction or pneumoperitoneum. He does have persistent pain on palpation but he appears comfortable otherwise. The possible etiology are biliary colic based on the finding of CT scan and peptic ulcer disease. We will keep him NPO and give bowel rest,  protonix twice a day. Nausea support and IV fluids. Pain control with morphine. GI consult will be needed in AM. No Abx needed as there is no sign of active infection at present.   DVT  Prophylaxis: mechanical compression device Nutrition: NPO  Code Status: full  Family Communication: Family was at bed side and was explained about the CT scan and USG abdomen finding and plan.   Author: Lynden Oxford, MD [facility] Pager: [phone] 03/13/2013, 6:14 AM    If 7PM-7AM, please contact night-coverage [email or web address] Password [email or web address]

## 2013-03-13 NOTE — Progress Notes (Signed)
Patient arrive on unit from ED, wife at bedside.  BP 146/84, HR 72, T 98.3, O2 97% on ra, R 18.

## 2013-03-13 NOTE — Progress Notes (Signed)
2:39 PM I agree with HPI/GPe and A/P per Dr. 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 Associated with dark fluid in ostomy bag-CT scan suggestive of stomach edema, ultrasound suggestive of gallstones without acute cholecystitis  States is doing fair still mild abdominal pain and taking medication helps it Understands rationale for being n.p.o.  HEENTobese pleasant CHEST clear CARDIAC S1-S2 no murmur ABDOMEN soft but some tenderness to central abdomen   Patient Active Problem List   Diagnosis Date Noted  . Abdominal pain, unspecified site 03/13/2013  . Peptic ulcer disease 03/13/2013  . Cholelithiases 03/13/2013   Appreciate gastroenterology input CBC a.m.Will need an endoscopy-currently placed on Protonix drip Discontinued dilaudid, oxycodone 5mg  every 3 when necessary Continue amitriptyline 50 each bedtime, Cymbalta 60 daily, Paxil 20 daily, risperidone 0.5 twice a day Followup a.m.  Pleas Koch, MD [facility] [phone]

## 2013-03-14 ENCOUNTER — Encounter (HOSPITAL_COMMUNITY): Payer: Self-pay | Admitting: Gastroenterology

## 2013-03-14 DIAGNOSIS — R109 Unspecified abdominal pain: Secondary | ICD-10-CM

## 2013-03-14 DIAGNOSIS — B9681 Helicobacter pylori [H. pylori] as the cause of diseases classified elsewhere: Secondary | ICD-10-CM

## 2013-03-14 LAB — GLUCOSE, CAPILLARY: Glucose-Capillary: 80 mg/dL (ref 70–99)

## 2013-03-14 LAB — CBC
HCT: 30.9 % — ABNORMAL LOW (ref 39.0–52.0)
HCT: 29.8 % — ABNORMAL LOW (ref 39.0–52.0)
HCT: 28.9 % — ABNORMAL LOW (ref 39.0–52.0)
Hemoglobin: 10.7 g/dL — ABNORMAL LOW (ref 13.0–17.0)
Hemoglobin: 10.5 g/dL — ABNORMAL LOW (ref 13.0–17.0)
Hemoglobin: 10.1 g/dL — ABNORMAL LOW (ref 13.0–17.0)
MCH: 34.2 pg — ABNORMAL HIGH (ref 26.0–34.0)
MCH: 34.4 pg — ABNORMAL HIGH (ref 26.0–34.0)
MCH: 34.2 pg — ABNORMAL HIGH (ref 26.0–34.0)
MCHC: 34.6 g/dL (ref 30.0–36.0)
MCHC: 35.2 g/dL (ref 30.0–36.0)
MCHC: 34.9 g/dL (ref 30.0–36.0)
MCV: 98.7 fL (ref 78.0–100.0)
MCV: 97.7 fL (ref 78.0–100.0)
MCV: 98 fL (ref 78.0–100.0)
Platelets: 235 10*3/uL (ref 150–400)
Platelets: 234 10*3/uL (ref 150–400)
Platelets: 214 10*3/uL (ref 150–400)
RBC: 3.13 MIL/uL — ABNORMAL LOW (ref 4.22–5.81)
RBC: 3.05 MIL/uL — ABNORMAL LOW (ref 4.22–5.81)
RBC: 2.95 MIL/uL — ABNORMAL LOW (ref 4.22–5.81)
RDW: 14.2 % (ref 11.5–15.5)
RDW: 13.7 % (ref 11.5–15.5)
RDW: 13.8 % (ref 11.5–15.5)
WBC: 6.4 10*3/uL (ref 4.0–10.5)
WBC: 6.3 10*3/uL (ref 4.0–10.5)
WBC: 6 10*3/uL (ref 4.0–10.5)

## 2013-03-14 LAB — H. PYLORI ANTIBODY, IGG: H Pylori IgG: 3.59 {ISR} — ABNORMAL HIGH

## 2013-03-14 MED ORDER — METHOTREXATE SODIUM CHEMO INJECTION 25 MG/ML
20.0000 mg | INTRAMUSCULAR | Status: DC
  Administered 2013-03-14: 20 mg via SUBCUTANEOUS
  Filled 2013-03-14: qty 0.8

## 2013-03-14 MED ORDER — OXYCODONE HCL 5 MG PO TABS
10.0000 mg | ORAL_TABLET | ORAL | Status: DC | PRN
  Administered 2013-03-14 – 2013-03-16 (×12): 10 mg via ORAL
  Filled 2013-03-14 (×13): qty 2

## 2013-03-14 MED ORDER — SUCRALFATE 1 G PO TABS
1.0000 g | ORAL_TABLET | Freq: Three times a day (TID) | ORAL | Status: DC
  Administered 2013-03-14 – 2013-03-16 (×9): 1 g via ORAL
  Filled 2013-03-14 (×11): qty 1

## 2013-03-14 NOTE — Progress Notes (Signed)
Patient ID: Eddie Taylor, male   DOB: 1956/06/09, 57 y.o.   MRN: [id number] PT with no further bleeding. OK for clears from our standpoint No acute surgical plans.  Axel Filler, MD [facility] General [facility]vasive Surgery Trauma & Emergency Surgery

## 2013-03-14 NOTE — Progress Notes (Signed)
No further bleeding. Abdominal pain comes and goes. Hemoglobin essentially stable over past 24 hours. Abdomen soft, bowel sounds present. Feels hungry.  Impression: Quiescent GI bleed from massive duodenal ulcer. No clinical evidence of perforation  Recommendation: Add sucralfate. Okay for clear liquid diet.  Eddie Taylor, M.D. [phone]

## 2013-03-14 NOTE — Progress Notes (Addendum)
THIEN BERKA NGE:[id number] DOB: 02-10-1956 DOA: 03/12/2013 PCP: Provider Not In System  Brief narrative: 57 year old known total colectomy secondary to right-sided ischemic colitis with ostomy presented with acute abdominal pain a 03/13/13 Associated with dark fluid in ostomy bag-CT scan suggestive of stomach edema, ultrasound suggestive of gallstones without acute cholecystitis  Past medical history-As per Problem list  Consultants:  [facility]  Procedures: Endoscopy 8/24-Massive duodenal bulb ulcer with adherent clot (NO active bleeding and no other blood products seen on EGD).  Not amenable to endoscopic therapySmall gastric ulcers  Antibiotics:  none   Subjective  Irritable at not being able to eat but understands.  Has chronic RA pain on Methotraxate and wife expresses  Frustration at not knowing what to give him when he runs our of Opiates from Rheumatologist.  Had been using Naprocen  OTC to supplement Opiates No stools as yet today   Objective    Interim History: nad  Telemetry: Nsr, N tele  Objective: Filed Vitals:   03/13/13 1800 03/13/13 2124 03/14/13 0508 03/14/13 0830  BP: 112/59 111/74 101/65 95/69  Pulse: 79 63 54 54  Temp: 98.2 F (36.8 C) 98.3 F (36.8 C) 97.8 F (36.6 C) 97 F (36.1 C)  TempSrc: Oral Oral Oral Oral  Resp: 20 18 18 16   Height:      Weight:  94.4 kg (208 lb 1.8 oz)    SpO2: 96% 96% 95%     Intake/Output Summary (Last 24 hours) at 03/14/13 1013 Last data filed at 03/13/13 1852  Gross per 24 hour  Intake      0 ml  Output    100 ml  Net   -100 ml    Exam:  General: eomi, NCAT Cardiovascular: s1 s2 no m/r/g Respiratory: clear Abdomen: soft, NT Skin no le swelling Neuro intact  Data Reviewed: Basic Metabolic Panel:  Recent Labs Lab 03/12/13 2343 03/13/13 1303  NA 136 136  K 3.6 3.6  CL 99 103  CO2 22 23  GLUCOSE 109* 106*  BUN 14 11  CREATININE 0.74 0.85  CALCIUM 11.4* 10.0   Liver Function  Tests:  Recent Labs Lab 03/12/13 2343 03/13/13 1303  AST 23 19  ALT 19 15  ALKPHOS 38* 34*  BILITOT 0.6 0.4  PROT 7.4 6.4  ALBUMIN 3.8 3.3*    Recent Labs Lab 03/12/13 2343  LIPASE 24   No results found for this basename: AMMONIA,  in the last 168 hours CBC:  Recent Labs Lab 03/12/13 2343 03/13/13 1303 03/13/13 2220 03/14/13 0553  WBC 11.3* 8.4 8.1 6.4  NEUTROABS  --  5.6  --   --   HGB 13.0 11.5* 11.2* 10.7*  HCT 36.4* 32.2* 32.5* 30.9*  MCV 95.0 96.4 97.9 98.7  PLT 272 237 246 235   Cardiac Enzymes: No results found for this basename: CKTOTAL, CKMB, CKMBINDEX, TROPONINI,  in the last 168 hours BNP: No components found with this basename: POCBNP,  CBG:  Recent Labs Lab 03/13/13 0742 03/14/13 0754  GLUCAP 111* 80    No results found for this or any previous visit (from the past 240 hour(s)).   Studies:              All Imaging reviewed and is as per above notation   Scheduled Meds: . amitriptyline  50 mg Oral QHS  . atorvastatin  20 mg Oral q1800  . DULoxetine  60 mg Oral QHS,MR X 1  . fenofibrate  160  mg Oral QHS  . ferrous sulfate  325 mg Oral Q breakfast  . folic acid  1 mg Oral Daily  . methotrexate  20 mg Subcutaneous Q Sun  . PARoxetine  20 mg Oral QHS,MR X 1  . risperiDONE  0.5 mg Oral BID   Continuous Infusions: . sodium chloride 75 mL/hr at 03/13/13 1618  . pantoprozole (PROTONIX) infusion 8 mg/hr (03/14/13 0353)     Assessment/Plan: 1. Massive Duodenal Ulcer bleed-Appreciate GI input.  Surgery input appreciated.  NPO till determined safe to feed as per GI.  Hb stable.  If further bleed, would consult IR to let them know the need for Embolization-hemodynamically stable currently.  Continue Pain control OXY IR 5 q3 prn/ HC 7.5/325 q 6 prn.  Continue Pantoprazole Infusion per GI.  H pylori AB +, but will probably use Urea breathe test to better characterize this. 2. Rheumatoid Arthritis-Continue MTx cautiously.  Might need other  DMARD-Steroids are a relative contraindication-OP f/u with Rheumatology to assess-tough situation.  Would not work-up disease severity in acute illness as bleed might alter these indices 3. HLD-would hold statin and re-satrt with full diet/ on d/c 4. Bipolar-Elevail 50 qhs, Cymbalta 60 qhs, Paxil 20 qhs, Risperdal 0.5 bid  Code Status: Full Family Communication: Discussed with Wife in detail Disposition Plan:  nad   Pleas Koch, MD  [facility] Pager [phone] 03/14/2013, 10:13 AM    LOS: 2 days

## 2013-03-14 NOTE — Progress Notes (Signed)
1 Day Post-Op  Subjective: Still complaining of pain, his wife says he's getting grumpy without food.    Objective: Vital signs in last 24 hours: Temp:  [97 F (36.1 C)-98.3 F (36.8 C)] 97 F (36.1 C) (08/25 0830) Pulse Rate:  [54-79] 54 (08/25 0830) Resp:  [10-41] 16 (08/25 0830) BP: (95-145)/(55-86) 95/69 mmHg (08/25 0830) SpO2:  [92 %-99 %] 95 % (08/25 0508) Weight:  [94.4 kg (208 lb 1.8 oz)] 94.4 kg (208 lb 1.8 oz) (08/24 2124) Last BM Date: 03/13/13 Afebrile, VSS H/H is stable. Gallstones note on [place] with GB wall thickening, no cholecystitis.  CBD is 3.63mm. Intake/Output from previous day: 08/24 0701 - 08/25 0700 In: 0  Out: 300 [Urine:300] Intake/Output this shift:    General appearance: alert, cooperative and no distress GI: soft, but complains of tenderness in the mid abdomen.  Lab Results:   Recent Labs  03/13/13 2220 03/14/13 0553  WBC 8.1 6.4  HGB 11.2* 10.7*  HCT 32.5* 30.9*  PLT 246 235    BMET  Recent Labs  03/12/13 2343 03/13/13 1303  NA 136 136  K 3.6 3.6  CL 99 103  CO2 22 23  GLUCOSE 109* 106*  BUN 14 11  CREATININE 0.74 0.85  CALCIUM 11.4* 10.0   PT/INR  Recent Labs  03/13/13 1303  LABPROT 14.0  INR 1.10     Recent Labs Lab 03/12/13 2343 03/13/13 1303  AST 23 19  ALT 19 15  ALKPHOS 38* 34*  BILITOT 0.6 0.4  PROT 7.4 6.4  ALBUMIN 3.8 3.3*     Lipase     Component Value Date/Time   LIPASE 24 03/12/2013 2343     Studies/Results: US Abdomen Complete  03/13/2013   *RADIOLOGY REPORT*  Clinical Data:  Abdominal pain  COMPLETE ABDOMINAL ULTRASOUND  Comparison:  CT abdomen and pelvis 03/13/2013  Findings:  Gallbladder:  Cholelithiasis with multiple mobile gallstones.  No gallbladder wall thickening or edema.  Murphy's sign is negative.  Common bile duct:  Normal caliber measuring 3.7 mm diameter.  Liver:  Diffusely heterogeneous parenchymal echotexture suggesting fatty infiltration.  IVC:  Not visualized due to overlying  bowel gas.  Pancreas:  Not visualized due to overlying bowel gas.  Spleen:  Spleen length measures 9.8 cm.  Normal parenchymal echotexture.  Right Kidney:  Right kidney measures 12.6 cm length.  No hydronephrosis.  Left Kidney:  Left kidney measures 14.3 cm length.  No hydronephrosis.  Abdominal aorta:  No aneurysm identified.  IMPRESSION: Cholelithiasis without additional evidence of cholecystitis.  Fatty infiltration of the liver.   Original Report Authenticated By: Burman Nieves, M.D.   Ct Abdomen Pelvis W Contrast  03/13/2013   *RADIOLOGY REPORT*  Clinical Data: All over abdominal pain.  Nausea and vomiting. History of diverticulitis.  CT ABDOMEN AND PELVIS WITH CONTRAST  Technique:  Multidetector CT imaging of the abdomen and pelvis was performed following the standard protocol during bolus administration of intravenous contrast.  Contrast: OMNIPAQUE IOHEXOL 300 MG/ML  SOLN  Comparison: 11/24/2008  Findings: Small subpleural nodule in the right lung base measuring about 4 mm diameter.  This appears stable since the previous study. Lung bases are otherwise clear.  Diffuse fatty infiltration of the liver.  Cholelithiasis without gallbladder wall thickening.  There is suggestion of wall thickening and edema in the pyloric region of the stomach. Mild surrounding fatty infiltration. Peptic ulcer disease should be considered.  No gastric distension.  There are prominent lymph nodes in the celiac  axis and gastrohepatic ligament region measuring up to about 13 mm diameter.  These may be inflammatory. No retroperitoneal lymphadenopathy.  The pancreas, spleen, adrenal glands, kidneys, inferior vena cava, and retroperitoneal lymph nodes are unremarkable.  Calcification of the aorta without aneurysm.  No free fluid or free air in the abdomen.  Small bowel are decompressed.  Right upper quadrant colostomy with decompressed distal colon.  Postoperative changes in the anterior abdominal wall.  Soft tissue nodule in  the subcutaneous fat of the right upper quadrant adjacent to the midline measures 15.9 cm likely represents a sebaceous cyst.  Pelvis:  Stool filled rectosigmoid colon without inflammatory change.  Calcification of the prostate gland.  The bladder is decompressed.  No free or loculated pelvic fluid collections.  No significant pelvic lymphadenopathy.  Degenerative changes in the lumbar spine.  IMPRESSION: Edema and surrounding inflammation around the pyloric region of the stomach suggesting peptic ulcer disease.  Cholelithiasis with no evidence of bile duct dilatation.   Original Report Authenticated By: Burman Nieves, M.D.    Medications: . amitriptyline  50 mg Oral QHS  . atorvastatin  20 mg Oral q1800  . DULoxetine  60 mg Oral QHS,MR X 1  . fenofibrate  160 mg Oral QHS  . ferrous sulfate  325 mg Oral Q breakfast  . folic acid  1 mg Oral Daily  . methotrexate  20 mg Subcutaneous Q Sun  . PARoxetine  20 mg Oral QHS,MR X 1  . risperiDONE  0.5 mg Oral BID    Assessment/Plan Duodenal ulcer, bleeding. S/p colectomy 2010 for right sided ischemic colitis/colostomy 08/2008.  He has chosen not to have the ostomy reversed.  Plan:  Medical management for now.   LOS: 2 days    Sarahann Horrell 03/14/2013

## 2013-03-15 LAB — COMPREHENSIVE METABOLIC PANEL
ALT: 15 U/L (ref 0–53)
AST: 21 U/L (ref 0–37)
Albumin: 3 g/dL — ABNORMAL LOW (ref 3.5–5.2)
Alkaline Phosphatase: 30 U/L — ABNORMAL LOW (ref 39–117)
BUN: 12 mg/dL (ref 6–23)
CO2: 24 mEq/L (ref 19–32)
Calcium: 9 mg/dL (ref 8.4–10.5)
Chloride: 109 mEq/L (ref 96–112)
Creatinine, Ser: 1 mg/dL (ref 0.50–1.35)
GFR calc Af Amer: >90 mL/min (ref >90)
GFR calc non Af Amer: 82 mL/min — ABNORMAL LOW (ref >90)
Glucose, Bld: 88 mg/dL (ref 70–99)
Potassium: 3.8 mEq/L (ref 3.5–5.1)
Sodium: 141 mEq/L (ref 135–145)
Total Bilirubin: 0.3 mg/dL (ref 0.3–1.2)
Total Protein: 5.8 g/dL — ABNORMAL LOW (ref 6.0–8.3)

## 2013-03-15 LAB — CBC
HCT: 28.4 % — ABNORMAL LOW (ref 39.0–52.0)
HCT: 30.5 % — ABNORMAL LOW (ref 39.0–52.0)
HCT: 30 % — ABNORMAL LOW (ref 39.0–52.0)
Hemoglobin: 9.8 g/dL — ABNORMAL LOW (ref 13.0–17.0)
Hemoglobin: 10.6 g/dL — ABNORMAL LOW (ref 13.0–17.0)
Hemoglobin: 10.5 g/dL — ABNORMAL LOW (ref 13.0–17.0)
MCH: 33.6 pg (ref 26.0–34.0)
MCH: 34 pg (ref 26.0–34.0)
MCH: 34.2 pg — ABNORMAL HIGH (ref 26.0–34.0)
MCHC: 34.5 g/dL (ref 30.0–36.0)
MCHC: 34.8 g/dL (ref 30.0–36.0)
MCHC: 35 g/dL (ref 30.0–36.0)
MCV: 97.3 fL (ref 78.0–100.0)
MCV: 97.8 fL (ref 78.0–100.0)
MCV: 97.7 fL (ref 78.0–100.0)
Platelets: 208 10*3/uL (ref 150–400)
Platelets: 227 10*3/uL (ref 150–400)
Platelets: 224 10*3/uL (ref 150–400)
RBC: 2.92 MIL/uL — ABNORMAL LOW (ref 4.22–5.81)
RBC: 3.12 MIL/uL — ABNORMAL LOW (ref 4.22–5.81)
RBC: 3.07 MIL/uL — ABNORMAL LOW (ref 4.22–5.81)
RDW: 13.8 % (ref 11.5–15.5)
RDW: 13.7 % (ref 11.5–15.5)
RDW: 13.7 % (ref 11.5–15.5)
WBC: 5.4 10*3/uL (ref 4.0–10.5)
WBC: 5.9 10*3/uL (ref 4.0–10.5)
WBC: 6.2 10*3/uL (ref 4.0–10.5)

## 2013-03-15 LAB — GLUCOSE, CAPILLARY
Glucose-Capillary: 94 mg/dL (ref 70–99)
Glucose-Capillary: 115 mg/dL — ABNORMAL HIGH (ref 70–99)

## 2013-03-15 LAB — OCCULT BLOOD X 1 CARD TO LAB, STOOL: Fecal Occult Bld: POSITIVE — AB

## 2013-03-15 MED ORDER — PANTOPRAZOLE SODIUM 40 MG PO TBEC
40.0000 mg | DELAYED_RELEASE_TABLET | Freq: Two times a day (BID) | ORAL | Status: DC
  Administered 2013-03-15 – 2013-03-16 (×2): 40 mg via ORAL
  Filled 2013-03-15 (×2): qty 1

## 2013-03-15 NOTE — Progress Notes (Signed)
2 Days Post-Op  Subjective: Pain improved but still present. Tolerating clears well. Feels that stoma output looks bloody again.   Objective: Vital signs in last 24 hours: Temp:  [97.4 F (36.3 C)-98.1 F (36.7 C)] 98 F (36.7 C) (08/26 0600) Pulse Rate:  [56-66] 59 (08/26 0600) Resp:  [18] 18 (08/26 0600) BP: (98-124)/(55-79) 98/55 mmHg (08/26 0600) SpO2:  [92 %-99 %] 92 % (08/26 0600) Last BM Date: 03/14/13 Afebrile, VSS H/H is stable. Gallstones note on [place] with GB wall thickening, no cholecystitis.  CBD is 3.22mm. Intake/Output from previous day:   Intake/Output this shift: Total I/O In: 580 [P.O.:580] Out: 160 [Stool:160]  Gen: NAD, alert, cooperative with exam CV: RRR, good S1/S2, no murmur Resp: CTABL, no wheezes, non-labored Abd: Soft, tenderness to palpation throughout but states that its worse in LLQ and LUQ. No guarding  Lab Results:   Recent Labs  03/14/13 2039 03/15/13 0445  WBC 6.0 5.4  HGB 10.1* 9.8*  HCT 28.9* 28.4*  PLT 214 208    BMET  Recent Labs  03/13/13 1303 03/15/13 0445  NA 136 141  K 3.6 3.8  CL 103 109  CO2 23 24  GLUCOSE 106* 88  BUN 11 12  CREATININE 0.85 1.00  CALCIUM 10.0 9.0   PT/INR  Recent Labs  03/13/13 1303  LABPROT 14.0  INR 1.10     Recent Labs Lab 03/12/13 2343 03/13/13 1303 03/15/13 0445  AST 23 19 21   ALT 19 15 15   ALKPHOS 38* 34* 30*  BILITOT 0.6 0.4 0.3  PROT 7.4 6.4 5.8*  ALBUMIN 3.8 3.3* 3.0*     Lipase     Component Value Date/Time   LIPASE 24 03/12/2013 2343     Studies/Results: No results found.  Medications: . amitriptyline  50 mg Oral QHS  . DULoxetine  60 mg Oral QHS,MR X 1  . ferrous sulfate  325 mg Oral Q breakfast  . folic acid  1 mg Oral Daily  . methotrexate  20 mg Subcutaneous Q Sun  . PARoxetine  20 mg Oral QHS,MR X 1  . risperiDONE  0.5 mg Oral BID  . sucralfate  1 g Oral TID AC & HS    Assessment/Plan 1. Duodenal ulcer bleed 2. S/p colectomy 2010 for right  sided ischemic colitis/colostomy 08/2008.  He has chosen not to have the ostomy reversed. 3. RA 4. HLD 5. Bipolar disorder  Duodenal ulcer - With minimal change in Hgb would continue medical management.  - Would proceed with embolization if re-bleed occurs - Tolerating diet well, advance - will continue to follow   LOS: 3 days    Eddie Taylor 03/15/2013 Pager: [phone] for surgery PA Pager for Ermalinda Memos [phone]

## 2013-03-15 NOTE — Progress Notes (Signed)
Feels "like a new person."  No overt bleeding. Minimal decrease in hemoglobin. BUN normal.  + H. Pylori Ab noted.  Detailed discussion w/ pt and wife re. Etiology of ulcer disease, H. Pylori issues, need for f/u, NSAID avoidance  IMPR:  1.  Extensive duodenal ulceration, ?NSAID's vs H pylori 2.  H. Pylori Ab + 3.  Post-hemorrhagic anemia (no Tx needed) 4.  S/p total abdominal colectomy 2010 ([facility]) for ischemic colitis   RECOMMEND:  1.  OK to advance diet to solid food (ordered) 2.  Dischg in 1-2 days reasonable, if remains stable 3.  Would dischg on bid PPI for at least 2 months, and sucralfate qid for 2 weeks 4.  Importance of NSAID avoidance d/w pt & wife 5.  I have made pt a f/u appt w/ Dr. Bosie Clos for Sept 11 at 9:45 a.m.    6.  Dr. Bosie Clos can decide about (1) whether to do lifelong PPI prophylaxis, (2) whether/when to do f/u egd to confirm healing, (3) whether to treat H. Pylori (?obtain stool antigen to confirm current infection), (4) whether pt could resume limited NSAID use with concurrent PPI prophylaxis 7.  Defer decision mgt of gallstones to surgeons 8.  I will sign off at this time; call prn if I can be of further help.  Eddie Taylor, M.D. [phone]

## 2013-03-15 NOTE — Progress Notes (Signed)
I have seen and examined the pt and agree with Dr. Felipa Emory progress note. Adv diet as tol No surgical plans.

## 2013-03-15 NOTE — Progress Notes (Signed)
Addendum to previous note:   Pt was on 81 mg ASA--although this probably COULD be resumed if deemed essential, it would be better avoided if possible, especially in the healing phase of the ulcer (next 4 - 8 weeks).  Florencia Reasons, M.D. [phone]

## 2013-03-15 NOTE — Progress Notes (Signed)
Eddie Taylor ZOX:[id number] DOB: 04/08/1956 DOA: 03/12/2013 PCP: Provider Not In System  Brief narrative: 57 year old known total colectomy secondary to right-sided ischemic colitis with ostomy presented with acute abdominal pain a 03/13/13 Associated with dark fluid in ostomy bag-CT scan suggestive of stomach edema, ultrasound suggestive of gallstones without acute cholecystitis  Past medical history-As per Problem list  Consultants:  [facility]-they will sign off as no surgical forseeable need-discussed with Dr. Ermalinda Memos 8/26  Procedures: Endoscopy 8/24-Massive duodenal bulb ulcer with adherent clot (NO active bleeding and no other blood products seen on EGD).  Not amenable to endoscopic therapySmall gastric ulcers  Antibiotics:  none   Subjective  Doing fairly well she has apparently graduated his diet-feels a lot better Today is his 39th wedding anniversary Wife is at bedside Reports some tinge of redness to the stool but no overt dark stool   Objective    Interim History: nad  Telemetry: Nsr, N tele  Objective: Filed Vitals:   03/14/13 1320 03/14/13 2144 03/15/13 0600 03/15/13 0938  BP: 124/74 101/79 98/55 106/65  Pulse: 66 56 59 59  Temp: 98.1 F (36.7 C) 97.4 F (36.3 C) 98 F (36.7 C) 97.9 F (36.6 C)  TempSrc: Oral Oral Oral Oral  Resp: 18 18 18 18   Height:      Weight:      SpO2: 93% 99% 92% 98%    Intake/Output Summary (Last 24 hours) at 03/15/13 1245 Last data filed at 03/15/13 1100  Gross per 24 hour  Intake    720 ml  Output    760 ml  Net    -40 ml    Exam:  General: eomi, NCAT Cardiovascular: s1 s2 no m/r/g Respiratory: clear Abdomen: soft, NT-no rebound no guarding and more quiescent that prior Skin no le swelling Neuro intact  Data Reviewed: Basic Metabolic Panel:  Recent Labs Lab 03/12/13 2343 03/13/13 1303 03/15/13 0445  NA 136 136 141  K 3.6 3.6 3.8  CL 99 103 109  CO2 22 23 24   GLUCOSE  109* 106* 88  BUN 14 11 12   CREATININE 0.74 0.85 1.00  CALCIUM 11.4* 10.0 9.0   Liver Function Tests:  Recent Labs Lab 03/12/13 2343 03/13/13 1303 03/15/13 0445  AST 23 19 21   ALT 19 15 15   ALKPHOS 38* 34* 30*  BILITOT 0.6 0.4 0.3  PROT 7.4 6.4 5.8*  ALBUMIN 3.8 3.3* 3.0*    Recent Labs Lab 03/12/13 2343  LIPASE 24   No results found for this basename: AMMONIA,  in the last 168 hours CBC:  Recent Labs Lab 03/13/13 1303 03/13/13 2220 03/14/13 0553 03/14/13 1509 03/14/13 2039 03/15/13 0445  WBC 8.4 8.1 6.4 6.3 6.0 5.4  NEUTROABS 5.6  --   --   --   --   --   HGB 11.5* 11.2* 10.7* 10.5* 10.1* 9.8*  HCT 32.2* 32.5* 30.9* 29.8* 28.9* 28.4*  MCV 96.4 97.9 98.7 97.7 98.0 97.3  PLT 237 246 235 234 214 208   Cardiac Enzymes: No results found for this basename: CKTOTAL, CKMB, CKMBINDEX, TROPONINI,  in the last 168 hours BNP: No components found with this basename: POCBNP,  CBG:  Recent Labs Lab 03/13/13 0742 03/14/13 0754  GLUCAP 111* 80    No results found for this or any previous visit (from the past 240 hour(s)).   Studies:              All Imaging reviewed and is as  per above notation   Scheduled Meds: . amitriptyline  50 mg Oral QHS  . DULoxetine  60 mg Oral QHS,MR X 1  . ferrous sulfate  325 mg Oral Q breakfast  . folic acid  1 mg Oral Daily  . methotrexate  20 mg Subcutaneous Q Sun  . PARoxetine  20 mg Oral QHS,MR X 1  . risperiDONE  0.5 mg Oral BID  . sucralfate  1 g Oral TID AC & HS   Continuous Infusions: . sodium chloride 75 mL/hr at 03/15/13 0025  . pantoprozole (PROTONIX) infusion 8 mg/hr (03/15/13 0849)     Assessment/Plan: 1. Massive Duodenal Ulcer bleed-Appreciate GI input. Diet has been graduated, Followup instructions and recommendations per Dr. Matthias Hughs much appreciated, Surgery input appreciated.  Patient to be discharged on twice a day PPI 8 weeks, sucralfate 4 times a day 2 weeks   H pylori AB +, but will probably use Urea  breathe test to better characterize this as outpatient.  Aware not to use NSAIDs-gastroenterology as outpatient to determine aspirin utility--- if he has dark stool or abdominal pain, would involve interventional radiology for embolization, and symptomatically manage until then 2. Asymptomatic cholelithiasis-outpatient followup surgery/PCP to determine best options for follow-up.  Might benefit from a discussion about utility BIle sequestrants 3. Rheumatoid Arthritis-Continue MTx cautiously.  Might need other DMARD-Steroids are a relative contraindication-OP f/u with Rheumatology to assess-please note that patient should have ten-day supply of opiates until can be seen by Dr. Wallis Mart as OP 4. HLD-would hold statin on d/c 5. Bipolar-Elevail 50 qhs, Cymbalta 60 qhs, Paxil 20 qhs, Risperdal 0.5 bid  Code Status: Full Family Communication: Discussed with Wife in detail Disposition Plan:  nad   Pleas Koch, MD  [facility] Pager [phone] 03/15/2013, 12:45 PM    LOS: 3 days

## 2013-03-16 LAB — GLUCOSE, CAPILLARY: Glucose-Capillary: 103 mg/dL — ABNORMAL HIGH (ref 70–99)

## 2013-03-16 LAB — CBC
HCT: 27 % — ABNORMAL LOW (ref 39.0–52.0)
HCT: 27.2 % — ABNORMAL LOW (ref 39.0–52.0)
Hemoglobin: 9.7 g/dL — ABNORMAL LOW (ref 13.0–17.0)
Hemoglobin: 9.7 g/dL — ABNORMAL LOW (ref 13.0–17.0)
MCH: 34.5 pg — ABNORMAL HIGH (ref 26.0–34.0)
MCH: 34.2 pg — ABNORMAL HIGH (ref 26.0–34.0)
MCHC: 35.9 g/dL (ref 30.0–36.0)
MCHC: 35.7 g/dL (ref 30.0–36.0)
MCV: 96.1 fL (ref 78.0–100.0)
MCV: 95.8 fL (ref 78.0–100.0)
Platelets: 204 10*3/uL (ref 150–400)
Platelets: 209 10*3/uL (ref 150–400)
RBC: 2.81 MIL/uL — ABNORMAL LOW (ref 4.22–5.81)
RBC: 2.84 MIL/uL — ABNORMAL LOW (ref 4.22–5.81)
RDW: 13.5 % (ref 11.5–15.5)
RDW: 13.5 % (ref 11.5–15.5)
WBC: 4.3 10*3/uL (ref 4.0–10.5)
WBC: 5.1 10*3/uL (ref 4.0–10.5)

## 2013-03-16 LAB — HEMOGLOBIN AND HEMATOCRIT, BLOOD
HCT: 27.2 % — ABNORMAL LOW (ref 39.0–52.0)
Hemoglobin: 9.7 g/dL — ABNORMAL LOW (ref 13.0–17.0)

## 2013-03-16 MED ORDER — SUCRALFATE 1 G PO TABS: 1.0000 g | ORAL_TABLET | Freq: Four times a day (QID) | ORAL | Status: DC

## 2013-03-16 MED ORDER — PANTOPRAZOLE SODIUM 40 MG PO TBEC: 40.0000 mg | DELAYED_RELEASE_TABLET | Freq: Two times a day (BID) | ORAL | Status: DC

## 2013-03-16 NOTE — Progress Notes (Signed)
Upon discharging patient, Dr. Thedore Mins requested that the patient call his pain doctor to get refill on pain medication. When I spoke with patient and his wife they informed me that their pain doctor leaves the office at 1pm on Wednesdays. Will notify Dr. Thedore Mins.

## 2013-03-16 NOTE — Progress Notes (Signed)
1 Day Post-Op  Subjective:  Still complaining of pain, his wife says he's getting grumpy without food.  Objective:  Vital signs in last 24 hours:  Temp: [97 F (36.1 C)-98.3 F (36.8 C)] 97 F (36.1 C) (08/25 0830)  Pulse Rate: [54-79] 54 (08/25 0830)  Resp: [10-41] 16 (08/25 0830)  BP: (95-145)/(55-86) 95/69 mmHg (08/25 0830)  SpO2: [92 %-99 %] 95 % (08/25 0508)  Weight: [94.4 kg (208 lb 1.8 oz)] 94.4 kg (208 lb 1.8 oz) (08/24 2124)  Last BM Date: 03/13/13  Afebrile, VSS  H/H is stable.  Gallstones note on [place] with GB wall thickening, no cholecystitis. CBD is 3.47mm.  Intake/Output from previous day:  08/24 0701 - 08/25 0700  In: 0  Out: 300 [Urine:300]  Intake/Output this shift:   General appearance: alert, cooperative and no distress  GI: soft, but complains of tenderness in the mid abdomen.  Lab Results:   Recent Labs   03/13/13 2220  03/14/13 0553   WBC  8.1  6.4   HGB  11.2*  10.7*   HCT  32.5*  30.9*   PLT  246  235    BMET   Recent Labs   03/12/13 2343  03/13/13 1303   NA  136  136   K  3.6  3.6   CL  99  103   CO2  22  23   GLUCOSE  109*  106*   BUN  14  11   CREATININE  0.74  0.85   CALCIUM  11.4*  10.0    PT/INR   Recent Labs   03/13/13 1303   LABPROT  14.0   INR  1.10     Recent Labs  Lab  03/12/13 2343  03/13/13 1303   AST  23  19   ALT  19  15   ALKPHOS  38*  34*   BILITOT  0.6  0.4   PROT  7.4  6.4   ALBUMIN  3.8  3.3*    Lipase    Component  Value  Date/Time    LIPASE  24  03/12/2013 2343    Studies/Results:  US Abdomen Complete  03/13/2013 *RADIOLOGY REPORT* Clinical Data: Abdominal pain COMPLETE ABDOMINAL ULTRASOUND Comparison: CT abdomen and pelvis 03/13/2013 Findings: Gallbladder: Cholelithiasis with multiple mobile gallstones. No gallbladder wall thickening or edema. Murphy's sign is negative. Common bile duct: Normal caliber measuring 3.7 mm diameter. Liver: Diffusely heterogeneous parenchymal echotexture suggesting  fatty infiltration. IVC: Not visualized due to overlying bowel gas. Pancreas: Not visualized due to overlying bowel gas. Spleen: Spleen length measures 9.8 cm. Normal parenchymal echotexture. Right Kidney: Right kidney measures 12.6 cm length. No hydronephrosis. Left Kidney: Left kidney measures 14.3 cm length. No hydronephrosis. Abdominal aorta: No aneurysm identified. IMPRESSION: Cholelithiasis without additional evidence of cholecystitis. Fatty infiltration of the liver. Original Report Authenticated By: Burman Nieves, M.D.  Ct Abdomen Pelvis W Contrast  03/13/2013 *RADIOLOGY REPORT* Clinical Data: All over abdominal pain. Nausea and vomiting. History of diverticulitis. CT ABDOMEN AND PELVIS WITH CONTRAST Technique: Multidetector CT imaging of the abdomen and pelvis was performed following the standard protocol during bolus administration of intravenous contrast. Contrast: OMNIPAQUE IOHEXOL 300 MG/ML SOLN Comparison: 11/24/2008 Findings: Small subpleural nodule in the right lung base measuring about 4 mm diameter. This appears stable since the previous study. Lung bases are otherwise clear. Diffuse fatty infiltration of the liver. Cholelithiasis without gallbladder wall thickening. There is suggestion of wall thickening and edema in  the pyloric region of the stomach. Mild surrounding fatty infiltration. Peptic ulcer disease should be considered. No gastric distension. There are prominent lymph nodes in the celiac axis and gastrohepatic ligament region measuring up to about 13 mm diameter. These may be inflammatory. No retroperitoneal lymphadenopathy. The pancreas, spleen, adrenal glands, kidneys, inferior vena cava, and retroperitoneal lymph nodes are unremarkable. Calcification of the aorta without aneurysm. No free fluid or free air in the abdomen. Small bowel are decompressed. Right upper quadrant colostomy with decompressed distal colon. Postoperative changes in the anterior abdominal wall. Soft  tissue nodule in the subcutaneous fat of the right upper quadrant adjacent to the midline measures 15.9 cm likely represents a sebaceous cyst. Pelvis: Stool filled rectosigmoid colon without inflammatory change. Calcification of the prostate gland. The bladder is decompressed. No free or loculated pelvic fluid collections. No significant pelvic lymphadenopathy. Degenerative changes in the lumbar spine. IMPRESSION: Edema and surrounding inflammation around the pyloric region of the stomach suggesting peptic ulcer disease. Cholelithiasis with no evidence of bile duct dilatation. Original Report Authenticated By: Burman Nieves, M.D.   Medications:  .  amitriptyline  50 mg  Oral  QHS   .  atorvastatin  20 mg  Oral  q1800   .  DULoxetine  60 mg  Oral  QHS,MR X 1   .  fenofibrate  160 mg  Oral  QHS   .  ferrous sulfate  325 mg  Oral  Q breakfast   .  folic acid  1 mg  Oral  Daily   .  methotrexate  20 mg  Subcutaneous  Q Sun   .  PARoxetine  20 mg  Oral  QHS,MR X 1   .  risperiDONE  0.5 mg  Oral  BID    Assessment/Plan  Duodenal ulcer, bleeding.  S/p colectomy 2010 for right sided ischemic colitis/colostomy 08/2008. He has chosen not to have the ostomy reversed.  Plan: Medical management for now.  LOS: 2 days  Naoko Diperna  03/14/2013  03/16/13  i have copied and pasted note for Dr Magnus Ivan to sign.  Singed in error by Dr. Matthias Hughs.

## 2013-03-16 NOTE — Progress Notes (Signed)
[facility]                                                                                Patient Demographics  Eddie Taylor, is a 57 y.o. male, DOB - 04/24/1956, ZOX:[id number], WJX:[id number]  Admit date - 03/12/2013  Admitting Physician Lynden Oxford, MD  Outpatient Primary MD for the patient is Provider Not In System  LOS - 4   Chief Complaint  Patient presents with  . 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      Assessment & Plan    Massive Duodenal Ulcer bleed-Appreciate GI input. Diet has been graduated, Followup instructions and recommendations per Dr. Matthias Hughs much appreciated, Surgery input appreciated. Patient to be discharged on twice a day PPI 8 weeks, sucralfate 4 times a day 2 weeks H pylori AB +, but will probably use Urea breathe test to better characterize this as outpatient.  Follow with gastroenterology as outpatient to determine aspirin utility, discussed with GI again on 03/16/2013. Continue to monitor H&H for 1 more day if stable discharge on 03/17/2013.     Asymptomatic cholelithiasis-outpatient followup surgery/PCP to determine best options for follow-up.      Rheumatoid Arthritis-Continue MTx cautiously. OP f/u with Rheumatology to assess-please note that patient should have ten-day supply of opiates until can be seen by Dr. Wallis Mart as OP     HLD-would hold statin on d/c     Bipolar-Elevail 50 qhs, Cymbalta 60 qhs, Paxil 20 qhs, Risperdal 0.5 bid    Code Status: Full  Family Communication: wife   Disposition Plan: Home   Procedures EGD   Consults   GI   DVT Prophylaxis   SCDs    Lab Results  Component Value Date   PLT 204 03/16/2013    Medications  Scheduled Meds: . amitriptyline  50 mg Oral QHS  . DULoxetine  60 mg Oral QHS,MR X 1  . ferrous sulfate  325 mg Oral Q breakfast  . folic acid  1 mg Oral Daily  . methotrexate  20 mg Subcutaneous Q Sun  . pantoprazole  40 mg Oral BID WC  . PARoxetine  20 mg Oral QHS,MR X 1  . risperiDONE  0.5 mg Oral BID  . sucralfate  1 g Oral TID AC & HS   Continuous Infusions: . sodium chloride 1,000 mL (03/16/13 0226)   PRN Meds:.HYDROcodone-acetaminophen, oxyCODONE  Antibiotics    Anti-infectives   Start     Dose/Rate Route Frequency Ordered Stop   03/13/13 0315  cefTRIAXone (ROCEPHIN) 1 g in dextrose 5 % 50 mL IVPB     1 g 100 mL/hr over 30 Minutes Intravenous  Once 03/13/13 0300 03/13/13 0330       Time Spent in minutes  35   SINGH,PRASHANT K M.D on 03/16/2013 at 12:51 PM  Between 7am to 7pm -  Pager - [phone]  After 7pm go to [email or web address] - password [email or web address]  And look for the night coverage person covering for me after hours  [facility] Office  [phone]    Subjective:   Eddie Taylor today has, No headache, No chest pain, No abdominal pain - No Nausea, No new weakness tingling or numbness, No Cough - SOB.    Objective:   Filed Vitals:   03/15/13 1758 03/15/13 1957 03/16/13 0547 03/16/13 0920  BP: 135/64 133/76 133/77 135/84  Pulse: 65 60 65 62  Temp: 98.3 F (36.8 C) 98.4 F (36.9 C) 98.5 F (36.9 C) 97.9 F (36.6 C)  TempSrc: Oral Oral Oral Oral  Resp: 18 18 18 20   Height:      Weight:  98.5 kg (217 lb 2.5 oz)    SpO2: 100% 100% 98% 98%    Wt Readings from Last 3 Encounters:  03/15/13 98.5 kg (217 lb 2.5 oz)  03/15/13 98.5 kg (217 lb 2.5 oz)     Intake/Output Summary (Last 24 hours) at 03/16/13 1251 Last data filed at 03/16/13 0959  Gross per 24 hour  Intake 2237.5 ml  Output   2651 ml  Net -413.5 ml    Exam Awake  Alert, Oriented X 3, No new F.N deficits, Normal affect Stanley.AT,PERRAL Supple Neck,No JVD, No cervical lymphadenopathy appriciated.  Symmetrical Chest wall movement, Good air movement bilaterally, CTAB RRR,No Gallops,Rubs or new Murmurs, No Parasternal Heave +ve B.Sounds, Abd Soft, Non tender, No organomegaly appriciated, No rebound - guarding or rigidity. Ileostomy stable No Cyanosis, Clubbing or edema, No new Rash or bruise      Data Review   Micro Results No results found for this or any previous visit (from the past 240 hour(s)).  Radiology Reports US Abdomen Complete  03/13/2013   *RADIOLOGY REPORT*  Clinical Data:  Abdominal pain  COMPLETE ABDOMINAL ULTRASOUND  Comparison:  CT abdomen and pelvis 03/13/2013  Findings:  Gallbladder:  Cholelithiasis with multiple mobile gallstones.  No gallbladder wall thickening or edema.  Murphy's sign is negative.  Common bile duct:  Normal caliber measuring 3.7 mm diameter.  Liver:  Diffusely heterogeneous parenchymal echotexture suggesting fatty infiltration.  IVC:  Not visualized due to overlying bowel gas.  Pancreas:  Not visualized due to overlying bowel gas.  Spleen:  Spleen length measures 9.8 cm.  Normal parenchymal echotexture.  Right Kidney:  Right kidney measures 12.6 cm length.  No hydronephrosis.  Left Kidney:  Left kidney measures 14.3 cm length.  No hydronephrosis.  Abdominal aorta:  No aneurysm identified.  IMPRESSION: Cholelithiasis without additional evidence of cholecystitis.  Fatty infiltration of the liver.   Original Report Authenticated By: Burman Nieves, M.D.   Ct Abdomen Pelvis W Contrast  03/13/2013   *RADIOLOGY REPORT*  Clinical Data: All over abdominal pain.  Nausea and vomiting. History of diverticulitis.  CT ABDOMEN AND PELVIS WITH CONTRAST  Technique:  Multidetector CT imaging of the abdomen and pelvis was performed following the standard protocol during bolus administration of intravenous contrast.  Contrast: OMNIPAQUE  IOHEXOL 300 MG/ML  SOLN  Comparison: 11/24/2008  Findings: Small subpleural nodule in the right lung base measuring about 4 mm diameter.  This appears stable since the previous study. Lung bases are otherwise clear.  Diffuse fatty infiltration of the liver.  Cholelithiasis without gallbladder wall thickening.  There is suggestion of wall thickening and edema in the pyloric region of the stomach. Mild surrounding fatty infiltration. Peptic ulcer disease should be considered.  No gastric distension.  There are prominent lymph nodes in the celiac axis and gastrohepatic ligament region measuring up to about 13 mm diameter.  These may be inflammatory. No retroperitoneal lymphadenopathy.  The pancreas, spleen, adrenal glands, kidneys, inferior vena cava, and retroperitoneal lymph nodes are unremarkable.  Calcification of the aorta without aneurysm.  No free fluid or free air in the abdomen.  Small bowel are decompressed.  Right upper quadrant colostomy with decompressed distal colon.  Postoperative changes in the anterior abdominal wall.  Soft tissue nodule in the subcutaneous fat of the right upper quadrant adjacent to the midline measures 15.9 cm likely represents a sebaceous cyst.  Pelvis:  Stool filled rectosigmoid colon without inflammatory change.  Calcification of the prostate gland.  The bladder is decompressed.  No free or loculated pelvic fluid collections.  No significant pelvic lymphadenopathy.  Degenerative changes in the lumbar spine.  IMPRESSION: Edema and surrounding inflammation around the pyloric region of the stomach suggesting peptic ulcer disease.  Cholelithiasis with no evidence of bile duct dilatation.   Original Report Authenticated By: Burman Nieves, M.D.    CBC  Recent Labs Lab 03/13/13 1303  03/14/13 2039 03/15/13 0445 03/15/13 1432 03/15/13 2100 03/16/13 0632 03/16/13 1120  WBC 8.4  < > 6.0 5.4 5.9 6.2 4.3  --   HGB 11.5*  < > 10.1* 9.8* 10.6* 10.5* 9.7* 9.7*  HCT 32.2*  < >  28.9* 28.4* 30.5* 30.0* 27.0* 27.2*  PLT 237  < > 214 208 227 224 204  --   MCV 96.4  < > 98.0 97.3 97.8 97.7 96.1  --   MCH 34.4*  < > 34.2* 33.6 34.0 34.2* 34.5*  --   MCHC 35.7  < > 34.9 34.5 34.8 35.0 35.9  --   RDW 13.8  < > 13.8 13.8 13.7 13.7 13.5  --   LYMPHSABS 1.9  --   --   --   --   --   --   --   MONOABS 0.7  --   --   --   --   --   --   --   EOSABS 0.3  --   --   --   --   --   --   --   BASOSABS 0.0  --   --   --   --   --   --   --   < > = values in this interval not displayed.  Chemistries   Recent Labs Lab 03/12/13 2343 03/13/13 1303 03/15/13 0445  NA 136 136 141  K 3.6 3.6 3.8  CL 99 103 109  CO2 22 23 24   GLUCOSE 109* 106* 88  BUN 14 11 12   CREATININE 0.74 0.85 1.00  CALCIUM 11.4* 10.0 9.0  AST 23 19 21   ALT 19 15 15   ALKPHOS 38* 34* 30*  BILITOT 0.6 0.4 0.3   ------------------------------------------------------------------------------------------------------------------ estimated creatinine clearance is 100.3 ml/min (by C-G formula based on Cr of 1). ------------------------------------------------------------------------------------------------------------------ No results found for this basename: HGBA1C,  in the last 72 hours ------------------------------------------------------------------------------------------------------------------ No results found for this basename: CHOL, HDL, LDLCALC, TRIG, CHOLHDL, LDLDIRECT,  in the last 72 hours ------------------------------------------------------------------------------------------------------------------ No results found for this basename: TSH, T4TOTAL, FREET3, T3FREE, THYROIDAB,  in the last 72 hours ------------------------------------------------------------------------------------------------------------------ No results found for this basename: VITAMINB12, FOLATE, FERRITIN, TIBC, IRON, RETICCTPCT,  in the last 72 hours  Coagulation profile  Recent Labs Lab 03/13/13 1303  INR 1.10  No  results found for this basename: DDIMER,  in the last 72 hours  Cardiac Enzymes No results found for this basename: CK, CKMB, TROPONINI, MYOGLOBIN,  in the last 168 hours ------------------------------------------------------------------------------------------------------------------ No components found with this basename: POCBNP,

## 2013-03-16 NOTE — Discharge Summary (Addendum)
[facility]                                                                                   Eddie Taylor, is a 57 y.o. male  DOB 04-17-1956  MRN [id number].  Admission date:  03/12/2013  Discharge Date:  03/16/2013  Primary MD  Provider Not In System  Admitting Physician  Lynden Oxford, MD  Admission Diagnosis  Abdominal pain gi bleed  Discharge Diagnosis     Principal Problem:   Abdominal pain, unspecified site Active Problems:   Peptic ulcer disease   Cholelithiases   Melena   H pylori ulcer    Past Medical History  Diagnosis Date  . Diverticulitis     Past Surgical History  Procedure Laterality Date  . Colostomy    . Colon surgery    . Esophagogastroduodenoscopy N/A 03/13/2013    Procedure: ESOPHAGOGASTRODUODENOSCOPY (EGD);  Surgeon: Shirley Friar, MD;  Location: [facility] ENDOSCOPY;  Service: Endoscopy;  Laterality: N/A;     Recommendations for primary care physician for things to follow:   Follow CBC closely, make sure patient follows with GI physician in one to 2 weeks time frame and gets H. Pylori antigen results followed up   Discharge Diagnoses:   Principal Problem:   Abdominal pain, unspecified site Active Problems:   Peptic ulcer disease   Cholelithiases   Melena   H pylori ulcer    Discharge Condition: Stable   Diet recommendation: See Discharge Instructions below   Consults GI, [facility]    History of present illness and  Hospital Course:     Kindly see H&P for history of present illness and admission details, please review complete Labs, Consult reports and Test reports for all details in brief Eddie Taylor, is a 57 y.o. 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  His H&H remained stable, his ileostomy bag is brown stool in it, he is now symptom-free I have discussed his case with GI physician on call Dr. Matthias Hughs who agrees for home discharge with close outpatient followup with primary care physician and GI physician has recommended below, note patient's H. pylori IgG anti-body is positive however an pylori antigen test is still pending and will be followed up outpatient per Dr. Matthias Hughs. He will be discharged home on twice a day PPI and 4 times a day Carafate as recommended by GI.   He has asymptomatic cholelithiasis for which she will follow with general surgery outpatient in 2-3 weeks time frame.  Continue his home medications for rheumatoid arthritis unchanged, he follows with Dr. Corliss Skains.    History of dyslipidemia and hypo-disorder resume home medications unchanged.   Addendum after discharge patient requested that he needs 10 day supply of Vicodin, he told the RN that he cannot get his pain meds from the clinic he gets pain Meds as that Doctor (  Dr. Fatima Sanger) had left the office for the day at 1pm.   I then called his primary care physician Dr. Len Childs office, Dr Fatima Sanger (she was present and not left till 4pm) office along with his [facility] pharmacy, patient received 90 pills of Vicodin on 02/21/2013 for one-month supply from his pain physician, I informed the patient that he should still have plenty of Vicodin pills left since her spent 4 days in the hospital and his prescriptions was supposed to last till September 4. Furthermore he goes to a  clinic for pain Meds and has a Narcotic contract with them, and any extra narcotics should be prescribed from the same office. Note he had been admitted for Acute onset of abdominal pain for under 1 day duration, this does not explain him consuming a full 10 day supply in 1 day which he claims which amounts to 30 pills in  1 day.  I was then called by the patient's wife who requested to give him extra pain pills as Dr. Fatima Sanger office had asked her to do so, I then called Dr. Corliss Skains and talked to her, patient follows with her for Narcotics and has a pain contract with her, she informed me that patient has a pain contract with her and she has not recommended him to ask for any narcotics from this hospital. She confirmed that she had prescribed him 90 pills on the fourth of this month. Patient was again told that if he requires any extra pain medications to contact the physician with whom he has a pain contract.  I offered the patient 1 day supply of pain meds till he could be seen at the Office but he told "Sir please don't bother, I am fine".     Today   Subjective:   Eddie Taylor today has no headache,no chest abdominal pain,no new weakness tingling or numbness, feels much better wants to go home today.   Objective:   Blood pressure 130/69, pulse 67, temperature 98.1 F (36.7 C), temperature source Oral, resp. rate 20, height 6' (1.829 m), weight 98.5 kg (217 lb 2.5 oz), SpO2 97.00%.   Intake/Output Summary (Last 24 hours) at 03/16/13 1610 Last data filed at 03/16/13 1320  Gross per 24 hour  Intake 2357.5 ml  Output   2681 ml  Net -323.5 ml    Exam Awake Alert, Oriented *3, No new F.N deficits, Normal affect [place].AT,PERRAL Supple Neck,No JVD, No cervical lymphadenopathy appriciated.  Symmetrical Chest wall movement, Good air movement bilaterally, CTAB RRR,No Gallops,Rubs or new Murmurs, No Parasternal Heave +ve B.Sounds, Abd Soft, Non tender, No organomegaly appriciated, No rebound -guarding or rigidity.Ileostomy has brown stool No Cyanosis, Clubbing or edema, No new Rash or bruise  Data Review   Major procedures and Radiology Reports - PLEASE review detailed and final reports for all details, in brief -   EGD -   8/24-Massive duodenal bulb ulcer with adherent clot (NO active bleeding  and no other blood products seen on EGD). Not amenable to endoscopic therapySmall gastric ulcers   US Abdomen Complete  03/13/2013   *RADIOLOGY REPORT*  Clinical Data:  Abdominal pain  COMPLETE ABDOMINAL ULTRASOUND  Comparison:  CT abdomen and pelvis 03/13/2013  Findings:  Gallbladder:  Cholelithiasis with multiple mobile gallstones.  No gallbladder wall thickening or edema.  Murphy's sign is negative.  Common bile duct:  Normal caliber measuring 3.7 mm diameter.  Liver:  Diffusely heterogeneous parenchymal echotexture suggesting fatty infiltration.  IVC:  Not visualized due to  overlying bowel gas.  Pancreas:  Not visualized due to overlying bowel gas.  Spleen:  Spleen length measures 9.8 cm.  Normal parenchymal echotexture.  Right Kidney:  Right kidney measures 12.6 cm length.  No hydronephrosis.  Left Kidney:  Left kidney measures 14.3 cm length.  No hydronephrosis.  Abdominal aorta:  No aneurysm identified.  IMPRESSION: Cholelithiasis without additional evidence of cholecystitis.  Fatty infiltration of the liver.   Original Report Authenticated By: Burman Nieves, M.D.   Ct Abdomen Pelvis W Contrast  03/13/2013   *RADIOLOGY REPORT*  Clinical Data: All over abdominal pain.  Nausea and vomiting. History of diverticulitis.  CT ABDOMEN AND PELVIS WITH CONTRAST  Technique:  Multidetector CT imaging of the abdomen and pelvis was performed following the standard protocol during bolus administration of intravenous contrast.  Contrast: OMNIPAQUE IOHEXOL 300 MG/ML  SOLN  Comparison: 11/24/2008  Findings: Small subpleural nodule in the right lung base measuring about 4 mm diameter.  This appears stable since the previous study. Lung bases are otherwise clear.  Diffuse fatty infiltration of the liver.  Cholelithiasis without gallbladder wall thickening.  There is suggestion of wall thickening and edema in the pyloric region of the stomach. Mild surrounding fatty infiltration. Peptic ulcer disease should be  considered.  No gastric distension.  There are prominent lymph nodes in the celiac axis and gastrohepatic ligament region measuring up to about 13 mm diameter.  These may be inflammatory. No retroperitoneal lymphadenopathy.  The pancreas, spleen, adrenal glands, kidneys, inferior vena cava, and retroperitoneal lymph nodes are unremarkable.  Calcification of the aorta without aneurysm.  No free fluid or free air in the abdomen.  Small bowel are decompressed.  Right upper quadrant colostomy with decompressed distal colon.  Postoperative changes in the anterior abdominal wall.  Soft tissue nodule in the subcutaneous fat of the right upper quadrant adjacent to the midline measures 15.9 cm likely represents a sebaceous cyst.  Pelvis:  Stool filled rectosigmoid colon without inflammatory change.  Calcification of the prostate gland.  The bladder is decompressed.  No free or loculated pelvic fluid collections.  No significant pelvic lymphadenopathy.  Degenerative changes in the lumbar spine.  IMPRESSION: Edema and surrounding inflammation around the pyloric region of the stomach suggesting peptic ulcer disease.  Cholelithiasis with no evidence of bile duct dilatation.   Original Report Authenticated By: Burman Nieves, M.D.    Micro Results      No results found for this or any previous visit (from the past 240 hour(s)).   CBC w Diff: Lab Results  Component Value Date   WBC 5.1 03/16/2013   HGB 9.7* 03/16/2013   HGB 9.7* 03/16/2013   HCT 27.2* 03/16/2013   HCT 27.2* 03/16/2013   PLT 209 03/16/2013   LYMPHOPCT 22 03/13/2013   MONOPCT 8 03/13/2013   EOSPCT 3 03/13/2013   BASOPCT 1 03/13/2013    CMP: Lab Results  Component Value Date   NA 141 03/15/2013   K 3.8 03/15/2013   CL 109 03/15/2013   CO2 24 03/15/2013   BUN 12 03/15/2013   CREATININE 1.00 03/15/2013   PROT 5.8* 03/15/2013   ALBUMIN 3.0* 03/15/2013   BILITOT 0.3 03/15/2013   ALKPHOS 30* 03/15/2013   AST 21 03/15/2013   ALT 15 03/15/2013   .   Discharge Instructions     Follow with Primary MD  in 3 days   Get CBC, CMP, checked 3 days by Primary MD and again as instructed by your Primary MD.  Get Medicines reviewed and adjusted.  Please request your Prim.MD to go over all Hospital Tests and Procedure/Radiological results at the follow up, please get all Hospital records sent to your Prim MD by signing hospital release before you go home.  Activity: As tolerated with Full fall precautions use walker/cane & assistance as needed   Diet:  Heart healthy.  For Heart failure patients - Check your Weight same time everyday, if you gain over 2 pounds, or you develop in leg swelling, experience more shortness of breath or chest pain, call your Primary MD immediately. Follow Cardiac Low Salt Diet and 1.8 lit/day fluid restriction.  Disposition Home   If you experience worsening of your admission symptoms, develop shortness of breath, life threatening emergency, suicidal or homicidal thoughts you must seek medical attention immediately by calling 911 or calling your MD immediately  if symptoms less severe.  You Must read complete instructions/literature along with all the possible adverse reactions/side effects for all the Medicines you take and that have been prescribed to you. Take any new Medicines after you have completely understood and accpet all the possible adverse reactions/side effects.   Do not drive and provide baby sitting services if your were admitted for syncope or siezures until you have seen by Primary MD or a Neurologist and advised to do so again.  Do not drive when taking Pain medications.    Do not take more than prescribed Pain, Sleep and Anxiety Medications  Special Instructions: If you have smoked or chewed Tobacco  in the last 2 yrs please stop smoking, stop any regular Alcohol  and or any Recreational drug use.  Wear Seat belts while driving.   Please note  You were cared for by a hospitalist  during your hospital stay. If you have any questions about your discharge medications or the care you received while you were in the hospital after you are discharged, you can call the unit and asked to speak with the hospitalist on call if the hospitalist that took care of you is not available. Once you are discharged, your primary care physician will handle any further medical issues. Please note that NO REFILLS for any discharge medications will be authorized once you are discharged, as it is imperative that you return to your primary care physician (or establish a relationship with a primary care physician if you do not have one) for your aftercare needs so that they can reassess your need for medications and monitor your lab values.      Follow-up Information   Follow up with PCP. Schedule an appointment as soon as possible for a visit in 3 days.      Follow up with SCHOOLER,VINCENT C., MD. Schedule an appointment as soon as possible for a visit in 1 week.   Specialty:  Gastroenterology   Contact information:   [address] [place], [address] [place] [address] [phone]       Follow up with [facility]. Schedule an appointment as soon as possible for a visit in 3 weeks.   Contact information:   [address]   [place] [address] [phone]         Discharge Medications     Medication List         amitriptyline 50 MG tablet  Commonly known as:  ELAVIL  Take 50 mg by mouth at bedtime.     Calcium 1200-1000 MG-UNIT Chew  Chew 1 tablet by mouth daily.     DULoxetine 30 MG capsule  Commonly known as:  CYMBALTA  Take 60 mg by mouth daily.     fenofibrate micronized 134 MG capsule  Commonly known as:  LOFIBRA  Take 134 mg by mouth daily before breakfast.     ferrous sulfate 325 (65 FE) MG tablet  Take 325 mg by mouth daily with breakfast.     folic acid 800 MCG tablet  Commonly known as:  FOLVITE   Take 800 mcg by mouth daily.     Garlic 1000 MG Caps  Take 1,000 mg by mouth daily.     HYDROcodone-acetaminophen 7.5-325 MG per tablet  Commonly known as:  NORCO  Take 1 tablet by mouth every 6 (six) hours as needed for pain.     Magnesium 250 MG Tabs  Take 250 mg by mouth daily.     Methotrexate Sodium (PF) 50 MG/2ML Soln  Inject 20 mg into the skin once a week. 20mg /0.78mL SQ on Sundays     multivitamin with minerals Tabs tablet  Take 1 tablet by mouth daily.     OMEGA 3 1000 MG Caps  Take 1 capsule by mouth 3 (three) times daily.     OSTEO BI-FLEX JOINT SHIELD PO  Take 1 tablet by mouth 2 (two) times daily.     pantoprazole 40 MG tablet  Commonly known as:  PROTONIX  Take 1 tablet (40 mg total) by mouth 2 (two) times daily with a meal.     PARoxetine 20 MG tablet  Commonly known as:  PAXIL  Take 20 mg by mouth every morning.     POTASSIUM PO  Take 1 tablet by mouth daily.     pravastatin 20 MG tablet  Commonly known as:  PRAVACHOL  Take 20 mg by mouth at bedtime.     risperiDONE 0.5 MG tablet  Commonly known as:  RISPERDAL  Take 0.5 mg by mouth 2 (two) times daily.     sucralfate 1 G tablet  Commonly known as:  CARAFATE  Take 1 tablet (1 g total) by mouth 4 (four) times daily.     VITAMIN A PO  Take 1 capsule by mouth daily.     VITAMIN B-1 PO  Take 1 tablet by mouth daily.     Zinc 50 MG Caps  Take 50 mg by mouth daily.           Total Time in preparing paper work, data evaluation and todays exam - 35 minutes  Leroy Sea M.D on 03/16/2013 at 4:10 PM  [facility] Office  [phone]

## 2013-03-16 NOTE — Progress Notes (Signed)
Patient discharged to home. Patient AVS reviewed. Patient understands that a refill of his pain medication will need to come through his Rheumatologist.  Patient verbalized understanding of follow-up appointments.  Patient remains stable; no signs or symptoms of distress.  Patient educated to return to the ER in cases of SOB, dizziness, fever, chest pain, or fainting.

## 2013-03-17 LAB — H.PYLORI ANTIGEN, STOOL

## 2013-05-06 ENCOUNTER — Emergency Department (HOSPITAL_COMMUNITY)
Admission: EM | Admit: 2013-05-06 | Discharge: 2013-05-06 | Disposition: A | Discharge status: Home / Self Care | Payer: Medicare Other | Attending: Emergency Medicine | Admitting: Emergency Medicine

## 2013-05-06 ENCOUNTER — Encounter (HOSPITAL_COMMUNITY): Payer: Self-pay | Admitting: Emergency Medicine

## 2013-05-06 DIAGNOSIS — Z860100 Personal history of colon polyps, unspecified: Secondary | ICD-10-CM | POA: Insufficient documentation

## 2013-05-06 DIAGNOSIS — Z79899 Other long term (current) drug therapy: Secondary | ICD-10-CM | POA: Insufficient documentation

## 2013-05-06 DIAGNOSIS — F17200 Nicotine dependence, unspecified, uncomplicated: Secondary | ICD-10-CM | POA: Insufficient documentation

## 2013-05-06 DIAGNOSIS — Z792 Long term (current) use of antibiotics: Secondary | ICD-10-CM | POA: Insufficient documentation

## 2013-05-06 DIAGNOSIS — Z9889 Other specified postprocedural states: Secondary | ICD-10-CM | POA: Insufficient documentation

## 2013-05-06 DIAGNOSIS — L0291 Cutaneous abscess, unspecified: Secondary | ICD-10-CM

## 2013-05-06 DIAGNOSIS — L02219 Cutaneous abscess of trunk, unspecified: Secondary | ICD-10-CM | POA: Insufficient documentation

## 2013-05-06 DIAGNOSIS — Z8739 Personal history of other diseases of the musculoskeletal system and connective tissue: Secondary | ICD-10-CM | POA: Insufficient documentation

## 2013-05-06 HISTORY — DX: Unspecified osteoarthritis, unspecified site: M19.90

## 2013-05-06 HISTORY — DX: Gastric ulcer, unspecified as acute or chronic, without hemorrhage or perforation: K25.9

## 2013-05-06 MED ORDER — OXYCODONE-ACETAMINOPHEN 5-325 MG PO TABS
2.0000 | ORAL_TABLET | Freq: Once | ORAL | Status: AC
  Administered 2013-05-06: 2 via ORAL
  Filled 2013-05-06: qty 2

## 2013-05-06 MED ORDER — DOXYCYCLINE HYCLATE 100 MG PO CAPS: 100.0000 mg | ORAL_CAPSULE | Freq: Two times a day (BID) | ORAL | Status: DC

## 2013-05-06 NOTE — ED Notes (Signed)
Redness present in the middle of the abdomen. Pt states that the area is tender with pus since Wednesday.

## 2013-05-06 NOTE — ED Notes (Signed)
Presents with abdominal induration baseball sized with redness around induration. Began Wednesday and has increased in size. Purulent drainage and pain. Denies fevers.

## 2013-05-06 NOTE — ED Provider Notes (Signed)
CSN: [id number]     Arrival date & time 05/06/13  2112 History   First MD Initiated Contact with Patient 05/06/13 2207     Chief Complaint  Patient presents with  . Abscess   (Consider location/radiation/quality/duration/timing/severity/associated sxs/prior Treatment) HPI Comments: Patient is a 57 year old male with a history of colon surgery with a colostomy bag who presents for an abscess to his abdomen x2 days. Patient states that symptoms began as an ingrown hair have been worsening since onset and are associated with intermittent purulent drainage. Patient denies trying anything for symptoms as well as any modifying factors. He denies associated fever, abdominal pain, nausea or vomiting, or problems with his ostomy site. Patient further denies any red linear streaking from the area. Patient denies a hx of MRSA. Endorses hx of abscesses x 1.  The history is provided by the patient. No language interpreter was used.    Past Medical History  Diagnosis Date  . Diverticulitis   . Gastric ulcer   . Arthritis    Past Surgical History  Procedure Laterality Date  . Colostomy    . Colon surgery    . Esophagogastroduodenoscopy N/A 03/13/2013    Procedure: ESOPHAGOGASTRODUODENOSCOPY (EGD);  Surgeon: Shirley Friar, MD;  Location[facility] ENDOSCOPY;  Service: Endoscopy;  Laterality: N/A;   History reviewed. No pertinent family history. History  Substance Use Topics  . Smoking status: Current Every Day Smoker  . Smokeless tobacco: Not on file  . Alcohol Use: Yes    Review of Systems  Constitutional: Negative for fever.  Gastrointestinal: Negative for nausea, vomiting and abdominal pain.  Skin: Positive for color change.  All other systems reviewed and are negative.    Allergies  Morphine and related; Prozac; and Sulfa antibiotics  Home Medications   Current Outpatient Rx  Name  Route  Sig  Dispense  Refill  . amitriptyline (ELAVIL) 50 MG tablet   Oral   Take 50 mg by mouth  at bedtime.         . Calcium 1200-1000 MG-UNIT CHEW   Oral   Chew 1 tablet by mouth daily.         . fenofibrate micronized (LOFIBRA) 134 MG capsule   Oral   Take 134 mg by mouth daily before breakfast.         . folic acid (FOLVITE) 1 MG tablet   Oral   Take 1 mg by mouth daily.         . Garlic 1000 MG CAPS   Oral   Take 1,000 mg by mouth daily.         Marland Kitchen HYDROcodone-acetaminophen (NORCO) 7.5-325 MG per tablet   Oral   Take 1 tablet by mouth every 6 (six) hours as needed for pain.         . Magnesium 250 MG TABS   Oral   Take 250 mg by mouth daily.         . Misc Natural Products (OSTEO BI-FLEX JOINT SHIELD PO)   Oral   Take 1 tablet by mouth 2 (two) times daily.         . Multiple Vitamin (MULTIVITAMIN WITH MINERALS) TABS tablet   Oral   Take 1 tablet by mouth daily.         . OMEGA 3 1000 MG CAPS   Oral   Take 1 capsule by mouth 3 (three) times daily.         . pantoprazole (PROTONIX) 40 MG tablet  Oral   Take 1 tablet (40 mg total) by mouth 2 (two) times daily with a meal.   60 tablet   3   . POTASSIUM PO   Oral   Take 1 tablet by mouth daily.         . pravastatin (PRAVACHOL) 20 MG tablet   Oral   Take 40 mg by mouth every other day.          . risperiDONE (RISPERDAL) 0.5 MG tablet   Oral   Take 0.5 mg by mouth 2 (two) times daily.         . sucralfate (CARAFATE) 1 G tablet   Oral   Take 1 tablet (1 g total) by mouth 4 (four) times daily.   64 tablet   0   . Thiamine HCl (VITAMIN B-1 PO)   Oral   Take 1 tablet by mouth daily.         Marland Kitchen VITAMIN A PO   Oral   Take 1 capsule by mouth daily.         . Zinc 50 MG CAPS   Oral   Take 50 mg by mouth daily.         Marland Kitchen zolpidem (AMBIEN) 10 MG tablet   Oral   Take 10 mg by mouth at bedtime as needed for sleep.         Marland Kitchen doxycycline (VIBRAMYCIN) 100 MG capsule   Oral   Take 1 capsule (100 mg total) by mouth 2 (two) times daily.   14 capsule   0    BP  117/79  Pulse 85  Temp(Src) 99.3 F (37.4 C) (Oral)  Resp 20  Wt 218 lb 3 oz (98.969 kg)  BMI 29.59 kg/m2  SpO2 96%  Physical Exam  Nursing note and vitals reviewed. Constitutional: He is oriented to person, place, and time. He appears well-developed and well-nourished. No distress.  HENT:  Head: Normocephalic and atraumatic.  Eyes: Conjunctivae and EOM are normal. No scleral icterus.  Neck: Normal range of motion.  Cardiovascular: Normal rate, regular rhythm and intact distal pulses.   Pulmonary/Chest: Effort normal. No respiratory distress.  Abdominal: Soft. He exhibits no distension and no mass. There is no rebound and no guarding.  Well appearing, pink stoma with brown stool in ostomy. See skin.  Musculoskeletal: Normal range of motion.  Neurological: He is alert and oriented to person, place, and time.  Skin: Skin is warm and dry. No rash noted. He is not diaphoretic. No erythema. No pallor.  Pustule with surround induration extending 2cm from center. Mild central fluctuance and erythema. Mild heat to touch. +purulent drainage. No red linear streaking. Area mildly TTP.  Psychiatric: He has a normal mood and affect. His behavior is normal.    ED Course  Procedures (including critical care time) Labs Review Labs Reviewed - No data to display Imaging Review No results found.  EKG Interpretation   None       INCISION AND DRAINAGE Performed by: Antony Madura Consent: Verbal consent obtained. Risks and benefits: risks, benefits and alternatives were discussed Type: abscess  Body area: supraumbilical abdomen  Anesthesia: local infiltration  Incision was made with a scalpel.  Local anesthetic: lidocaine 2% without epinephrine  Anesthetic total: 1 ml  Complexity: complex Blunt dissection to break up loculations  Drainage: purulent and bloody  Drainage amount: copious  Packing material: none  Patient tolerance: Patient tolerated the procedure well with no  immediate complications.    MDM  1. Abscess    57 year old male presents for an abscess to his abdomen x2 days. He is well and nontoxic appearing, hemodynamically stable, and afebrile. Abscess I&D'd at bedside with copious amounts of purulent and bloody drainage. Patient tolerated well. He is appropriate for discharge with primary care followup on Monday for abscess recheck. Patient will be started on doxycycline and warm soaks/compresses advised. Return precautions discussed with the patient who verbalizes comfort and understanding with this d/c plan.   Filed Vitals:   05/06/13 2120  BP: 117/79  Pulse: 85  Temp: 99.3 F (37.4 C)  TempSrc: Oral  Resp: 20  Weight: 218 lb 3 oz (98.969 kg)  SpO2: 96%     Antony Madura, PA-C 05/06/13 2258

## 2013-05-08 NOTE — ED Provider Notes (Signed)
Medical screening examination/treatment/procedure(s) were performed by non-physician practitioner and as supervising physician I was immediately available for consultation/collaboration.   Loren Racer, MD 05/08/13 [phone]

## 2013-06-20 ENCOUNTER — Other Ambulatory Visit: Payer: Self-pay | Admitting: Gastroenterology

## 2014-07-25 DIAGNOSIS — Z7952 Long term (current) use of systemic steroids: Secondary | ICD-10-CM | POA: Diagnosis not present

## 2014-07-25 DIAGNOSIS — M069 Rheumatoid arthritis, unspecified: Secondary | ICD-10-CM | POA: Diagnosis not present

## 2014-09-02 DIAGNOSIS — Z433 Encounter for attention to colostomy: Secondary | ICD-10-CM | POA: Diagnosis not present

## 2014-09-02 DIAGNOSIS — Z933 Colostomy status: Secondary | ICD-10-CM | POA: Diagnosis not present

## 2014-09-21 DIAGNOSIS — E780 Pure hypercholesterolemia: Secondary | ICD-10-CM | POA: Diagnosis not present

## 2014-09-21 DIAGNOSIS — F329 Major depressive disorder, single episode, unspecified: Secondary | ICD-10-CM | POA: Diagnosis not present

## 2014-09-21 DIAGNOSIS — F5221 Male erectile disorder: Secondary | ICD-10-CM | POA: Diagnosis not present

## 2014-09-21 DIAGNOSIS — M069 Rheumatoid arthritis, unspecified: Secondary | ICD-10-CM | POA: Diagnosis not present

## 2014-09-26 DIAGNOSIS — D499 Neoplasm of unspecified behavior of unspecified site: Secondary | ICD-10-CM | POA: Diagnosis not present

## 2014-09-26 DIAGNOSIS — L821 Other seborrheic keratosis: Secondary | ICD-10-CM | POA: Diagnosis not present

## 2014-10-04 DIAGNOSIS — D485 Neoplasm of uncertain behavior of skin: Secondary | ICD-10-CM | POA: Diagnosis not present

## 2014-10-04 DIAGNOSIS — L821 Other seborrheic keratosis: Secondary | ICD-10-CM | POA: Diagnosis not present

## 2014-11-27 DIAGNOSIS — Z933 Colostomy status: Secondary | ICD-10-CM | POA: Diagnosis not present

## 2014-11-27 DIAGNOSIS — Z433 Encounter for attention to colostomy: Secondary | ICD-10-CM | POA: Diagnosis not present

## 2014-12-21 DIAGNOSIS — M069 Rheumatoid arthritis, unspecified: Secondary | ICD-10-CM | POA: Diagnosis not present

## 2014-12-21 DIAGNOSIS — Z79899 Other long term (current) drug therapy: Secondary | ICD-10-CM | POA: Diagnosis not present

## 2014-12-21 DIAGNOSIS — F329 Major depressive disorder, single episode, unspecified: Secondary | ICD-10-CM | POA: Diagnosis not present

## 2014-12-21 DIAGNOSIS — E780 Pure hypercholesterolemia: Secondary | ICD-10-CM | POA: Diagnosis not present

## 2014-12-21 DIAGNOSIS — R5381 Other malaise: Secondary | ICD-10-CM | POA: Diagnosis not present

## 2014-12-21 DIAGNOSIS — Z79891 Long term (current) use of opiate analgesic: Secondary | ICD-10-CM | POA: Diagnosis not present

## 2015-02-09 DIAGNOSIS — Z7952 Long term (current) use of systemic steroids: Secondary | ICD-10-CM | POA: Diagnosis not present

## 2015-02-09 DIAGNOSIS — M069 Rheumatoid arthritis, unspecified: Secondary | ICD-10-CM | POA: Diagnosis not present

## 2015-04-05 DIAGNOSIS — R7989 Other specified abnormal findings of blood chemistry: Secondary | ICD-10-CM | POA: Diagnosis not present

## 2015-04-05 DIAGNOSIS — M069 Rheumatoid arthritis, unspecified: Secondary | ICD-10-CM | POA: Diagnosis not present

## 2015-04-05 DIAGNOSIS — Z79899 Other long term (current) drug therapy: Secondary | ICD-10-CM | POA: Diagnosis not present

## 2015-04-12 DIAGNOSIS — M7531 Calcific tendinitis of right shoulder: Secondary | ICD-10-CM | POA: Diagnosis not present

## 2015-04-12 DIAGNOSIS — R7989 Other specified abnormal findings of blood chemistry: Secondary | ICD-10-CM | POA: Diagnosis not present

## 2015-04-12 DIAGNOSIS — M25449 Effusion, unspecified hand: Secondary | ICD-10-CM | POA: Diagnosis not present

## 2015-04-12 DIAGNOSIS — Z79899 Other long term (current) drug therapy: Secondary | ICD-10-CM | POA: Diagnosis not present

## 2015-04-12 DIAGNOSIS — M059 Rheumatoid arthritis with rheumatoid factor, unspecified: Secondary | ICD-10-CM | POA: Diagnosis not present

## 2015-04-26 DIAGNOSIS — Z125 Encounter for screening for malignant neoplasm of prostate: Secondary | ICD-10-CM | POA: Diagnosis not present

## 2015-04-26 DIAGNOSIS — Z6832 Body mass index (BMI) 32.0-32.9, adult: Secondary | ICD-10-CM | POA: Diagnosis not present

## 2015-04-26 DIAGNOSIS — F17200 Nicotine dependence, unspecified, uncomplicated: Secondary | ICD-10-CM | POA: Diagnosis not present

## 2015-04-26 DIAGNOSIS — F1010 Alcohol abuse, uncomplicated: Secondary | ICD-10-CM | POA: Diagnosis not present

## 2015-04-26 DIAGNOSIS — Z0000 Encounter for general adult medical examination without abnormal findings: Secondary | ICD-10-CM | POA: Diagnosis not present

## 2015-04-26 DIAGNOSIS — E7800 Pure hypercholesterolemia, unspecified: Secondary | ICD-10-CM | POA: Diagnosis not present

## 2015-04-26 DIAGNOSIS — F329 Major depressive disorder, single episode, unspecified: Secondary | ICD-10-CM | POA: Diagnosis not present

## 2015-04-26 DIAGNOSIS — R030 Elevated blood-pressure reading, without diagnosis of hypertension: Secondary | ICD-10-CM | POA: Diagnosis not present

## 2015-04-26 DIAGNOSIS — M069 Rheumatoid arthritis, unspecified: Secondary | ICD-10-CM | POA: Diagnosis not present

## 2015-04-26 DIAGNOSIS — Z79899 Other long term (current) drug therapy: Secondary | ICD-10-CM | POA: Diagnosis not present

## 2015-04-26 DIAGNOSIS — E669 Obesity, unspecified: Secondary | ICD-10-CM | POA: Diagnosis not present

## 2015-05-17 DIAGNOSIS — M0570 Rheumatoid arthritis with rheumatoid factor of unspecified site without organ or systems involvement: Secondary | ICD-10-CM | POA: Diagnosis not present

## 2015-05-17 DIAGNOSIS — M25511 Pain in right shoulder: Secondary | ICD-10-CM | POA: Diagnosis not present

## 2015-05-17 DIAGNOSIS — M25562 Pain in left knee: Secondary | ICD-10-CM | POA: Diagnosis not present

## 2015-05-17 DIAGNOSIS — R945 Abnormal results of liver function studies: Secondary | ICD-10-CM | POA: Diagnosis not present

## 2015-05-17 DIAGNOSIS — M25561 Pain in right knee: Secondary | ICD-10-CM | POA: Diagnosis not present

## 2015-06-05 DIAGNOSIS — Z79899 Other long term (current) drug therapy: Secondary | ICD-10-CM | POA: Diagnosis not present

## 2015-06-05 DIAGNOSIS — M0570 Rheumatoid arthritis with rheumatoid factor of unspecified site without organ or systems involvement: Secondary | ICD-10-CM | POA: Diagnosis not present

## 2015-06-07 DIAGNOSIS — M25532 Pain in left wrist: Secondary | ICD-10-CM | POA: Diagnosis not present

## 2015-06-07 DIAGNOSIS — M25531 Pain in right wrist: Secondary | ICD-10-CM | POA: Diagnosis not present

## 2015-06-07 DIAGNOSIS — M25472 Effusion, left ankle: Secondary | ICD-10-CM | POA: Diagnosis not present

## 2015-06-07 DIAGNOSIS — M25562 Pain in left knee: Secondary | ICD-10-CM | POA: Diagnosis not present

## 2015-06-07 DIAGNOSIS — Z79899 Other long term (current) drug therapy: Secondary | ICD-10-CM | POA: Diagnosis not present

## 2015-06-07 DIAGNOSIS — M0570 Rheumatoid arthritis with rheumatoid factor of unspecified site without organ or systems involvement: Secondary | ICD-10-CM | POA: Diagnosis not present

## 2015-07-26 DIAGNOSIS — E7800 Pure hypercholesterolemia, unspecified: Secondary | ICD-10-CM | POA: Diagnosis not present

## 2015-07-26 DIAGNOSIS — M069 Rheumatoid arthritis, unspecified: Secondary | ICD-10-CM | POA: Diagnosis not present

## 2015-07-26 DIAGNOSIS — R739 Hyperglycemia, unspecified: Secondary | ICD-10-CM | POA: Diagnosis not present

## 2015-07-26 DIAGNOSIS — Z79899 Other long term (current) drug therapy: Secondary | ICD-10-CM | POA: Diagnosis not present

## 2015-07-26 DIAGNOSIS — F329 Major depressive disorder, single episode, unspecified: Secondary | ICD-10-CM | POA: Diagnosis not present

## 2015-08-09 DIAGNOSIS — R7989 Other specified abnormal findings of blood chemistry: Secondary | ICD-10-CM | POA: Diagnosis not present

## 2015-08-09 DIAGNOSIS — M0570 Rheumatoid arthritis with rheumatoid factor of unspecified site without organ or systems involvement: Secondary | ICD-10-CM | POA: Diagnosis not present

## 2015-08-09 DIAGNOSIS — Z79899 Other long term (current) drug therapy: Secondary | ICD-10-CM | POA: Diagnosis not present

## 2015-08-09 DIAGNOSIS — M25561 Pain in right knee: Secondary | ICD-10-CM | POA: Diagnosis not present

## 2015-11-07 DIAGNOSIS — Z79899 Other long term (current) drug therapy: Secondary | ICD-10-CM | POA: Diagnosis not present

## 2015-11-07 DIAGNOSIS — Z933 Colostomy status: Secondary | ICD-10-CM | POA: Diagnosis not present

## 2015-11-07 DIAGNOSIS — M0570 Rheumatoid arthritis with rheumatoid factor of unspecified site without organ or systems involvement: Secondary | ICD-10-CM | POA: Diagnosis not present

## 2015-11-07 DIAGNOSIS — E7800 Pure hypercholesterolemia, unspecified: Secondary | ICD-10-CM | POA: Diagnosis not present

## 2015-11-07 DIAGNOSIS — R251 Tremor, unspecified: Secondary | ICD-10-CM | POA: Diagnosis not present

## 2015-11-07 DIAGNOSIS — F329 Major depressive disorder, single episode, unspecified: Secondary | ICD-10-CM | POA: Diagnosis not present

## 2015-11-07 DIAGNOSIS — M069 Rheumatoid arthritis, unspecified: Secondary | ICD-10-CM | POA: Diagnosis not present

## 2015-11-08 DIAGNOSIS — M25442 Effusion, left hand: Secondary | ICD-10-CM | POA: Diagnosis not present

## 2015-11-08 DIAGNOSIS — Z79899 Other long term (current) drug therapy: Secondary | ICD-10-CM | POA: Diagnosis not present

## 2015-11-08 DIAGNOSIS — M25561 Pain in right knee: Secondary | ICD-10-CM | POA: Diagnosis not present

## 2015-11-08 DIAGNOSIS — M25441 Effusion, right hand: Secondary | ICD-10-CM | POA: Diagnosis not present

## 2015-11-08 DIAGNOSIS — M0570 Rheumatoid arthritis with rheumatoid factor of unspecified site without organ or systems involvement: Secondary | ICD-10-CM | POA: Diagnosis not present

## 2015-11-08 DIAGNOSIS — M25562 Pain in left knee: Secondary | ICD-10-CM | POA: Diagnosis not present

## 2015-11-09 DIAGNOSIS — Z933 Colostomy status: Secondary | ICD-10-CM | POA: Diagnosis not present

## 2015-11-09 DIAGNOSIS — Z433 Encounter for attention to colostomy: Secondary | ICD-10-CM | POA: Diagnosis not present

## 2015-11-22 DIAGNOSIS — J069 Acute upper respiratory infection, unspecified: Secondary | ICD-10-CM | POA: Diagnosis not present

## 2016-01-25 DIAGNOSIS — M0589 Other rheumatoid arthritis with rheumatoid factor of multiple sites: Secondary | ICD-10-CM | POA: Diagnosis not present

## 2016-01-25 DIAGNOSIS — Z79899 Other long term (current) drug therapy: Secondary | ICD-10-CM | POA: Diagnosis not present

## 2016-02-06 DIAGNOSIS — M069 Rheumatoid arthritis, unspecified: Secondary | ICD-10-CM | POA: Diagnosis not present

## 2016-02-06 DIAGNOSIS — E7800 Pure hypercholesterolemia, unspecified: Secondary | ICD-10-CM | POA: Diagnosis not present

## 2016-02-06 DIAGNOSIS — F329 Major depressive disorder, single episode, unspecified: Secondary | ICD-10-CM | POA: Diagnosis not present

## 2016-02-07 DIAGNOSIS — Z79899 Other long term (current) drug therapy: Secondary | ICD-10-CM | POA: Diagnosis not present

## 2016-02-07 DIAGNOSIS — R7989 Other specified abnormal findings of blood chemistry: Secondary | ICD-10-CM | POA: Diagnosis not present

## 2016-02-07 DIAGNOSIS — M25561 Pain in right knee: Secondary | ICD-10-CM | POA: Diagnosis not present

## 2016-02-07 DIAGNOSIS — M0570 Rheumatoid arthritis with rheumatoid factor of unspecified site without organ or systems involvement: Secondary | ICD-10-CM | POA: Diagnosis not present

## 2016-02-07 DIAGNOSIS — M2550 Pain in unspecified joint: Secondary | ICD-10-CM | POA: Diagnosis not present

## 2016-02-07 DIAGNOSIS — M25562 Pain in left knee: Secondary | ICD-10-CM | POA: Diagnosis not present

## 2016-02-14 DIAGNOSIS — G8929 Other chronic pain: Secondary | ICD-10-CM | POA: Diagnosis not present

## 2016-03-17 DIAGNOSIS — G8929 Other chronic pain: Secondary | ICD-10-CM | POA: Diagnosis not present

## 2016-03-17 DIAGNOSIS — Z933 Colostomy status: Secondary | ICD-10-CM | POA: Diagnosis not present

## 2016-03-17 DIAGNOSIS — Z433 Encounter for attention to colostomy: Secondary | ICD-10-CM | POA: Diagnosis not present

## 2016-03-17 DIAGNOSIS — R030 Elevated blood-pressure reading, without diagnosis of hypertension: Secondary | ICD-10-CM | POA: Diagnosis not present

## 2016-03-21 DIAGNOSIS — K219 Gastro-esophageal reflux disease without esophagitis: Secondary | ICD-10-CM | POA: Diagnosis not present

## 2016-03-21 DIAGNOSIS — R12 Heartburn: Secondary | ICD-10-CM | POA: Diagnosis not present

## 2016-03-21 DIAGNOSIS — Z8711 Personal history of peptic ulcer disease: Secondary | ICD-10-CM | POA: Diagnosis not present

## 2016-04-02 DIAGNOSIS — Z933 Colostomy status: Secondary | ICD-10-CM | POA: Diagnosis not present

## 2016-04-02 DIAGNOSIS — Z433 Encounter for attention to colostomy: Secondary | ICD-10-CM | POA: Diagnosis not present

## 2016-04-11 DIAGNOSIS — R7989 Other specified abnormal findings of blood chemistry: Secondary | ICD-10-CM | POA: Diagnosis not present

## 2016-04-11 DIAGNOSIS — Z79899 Other long term (current) drug therapy: Secondary | ICD-10-CM | POA: Diagnosis not present

## 2016-04-11 DIAGNOSIS — M0609 Rheumatoid arthritis without rheumatoid factor, multiple sites: Secondary | ICD-10-CM | POA: Diagnosis not present

## 2016-04-15 DIAGNOSIS — R945 Abnormal results of liver function studies: Secondary | ICD-10-CM | POA: Diagnosis not present

## 2016-04-15 DIAGNOSIS — G8929 Other chronic pain: Secondary | ICD-10-CM | POA: Diagnosis not present

## 2016-04-15 DIAGNOSIS — F1010 Alcohol abuse, uncomplicated: Secondary | ICD-10-CM | POA: Diagnosis not present

## 2016-04-15 DIAGNOSIS — M069 Rheumatoid arthritis, unspecified: Secondary | ICD-10-CM | POA: Diagnosis not present

## 2016-04-15 DIAGNOSIS — Z23 Encounter for immunization: Secondary | ICD-10-CM | POA: Diagnosis not present

## 2016-04-24 DIAGNOSIS — R7989 Other specified abnormal findings of blood chemistry: Secondary | ICD-10-CM | POA: Diagnosis not present

## 2016-04-24 DIAGNOSIS — R945 Abnormal results of liver function studies: Secondary | ICD-10-CM | POA: Diagnosis not present

## 2016-04-24 DIAGNOSIS — K8020 Calculus of gallbladder without cholecystitis without obstruction: Secondary | ICD-10-CM | POA: Diagnosis not present

## 2016-05-12 DIAGNOSIS — Z0000 Encounter for general adult medical examination without abnormal findings: Secondary | ICD-10-CM | POA: Diagnosis not present

## 2016-05-12 DIAGNOSIS — I10 Essential (primary) hypertension: Secondary | ICD-10-CM | POA: Diagnosis not present

## 2016-05-12 DIAGNOSIS — M79641 Pain in right hand: Secondary | ICD-10-CM | POA: Diagnosis not present

## 2016-05-12 DIAGNOSIS — M25572 Pain in left ankle and joints of left foot: Secondary | ICD-10-CM | POA: Diagnosis not present

## 2016-05-12 DIAGNOSIS — Z79899 Other long term (current) drug therapy: Secondary | ICD-10-CM | POA: Diagnosis not present

## 2016-05-12 DIAGNOSIS — M79642 Pain in left hand: Secondary | ICD-10-CM | POA: Diagnosis not present

## 2016-05-12 DIAGNOSIS — M25571 Pain in right ankle and joints of right foot: Secondary | ICD-10-CM | POA: Diagnosis not present

## 2016-05-12 DIAGNOSIS — F329 Major depressive disorder, single episode, unspecified: Secondary | ICD-10-CM | POA: Diagnosis not present

## 2016-05-12 DIAGNOSIS — F17200 Nicotine dependence, unspecified, uncomplicated: Secondary | ICD-10-CM | POA: Diagnosis not present

## 2016-05-12 DIAGNOSIS — M0570 Rheumatoid arthritis with rheumatoid factor of unspecified site without organ or systems involvement: Secondary | ICD-10-CM | POA: Diagnosis not present

## 2016-05-12 DIAGNOSIS — E7800 Pure hypercholesterolemia, unspecified: Secondary | ICD-10-CM | POA: Diagnosis not present

## 2016-06-03 DIAGNOSIS — M069 Rheumatoid arthritis, unspecified: Secondary | ICD-10-CM | POA: Diagnosis not present

## 2016-06-03 DIAGNOSIS — F329 Major depressive disorder, single episode, unspecified: Secondary | ICD-10-CM | POA: Diagnosis not present

## 2016-06-03 DIAGNOSIS — Z720 Tobacco use: Secondary | ICD-10-CM | POA: Diagnosis not present

## 2016-06-03 DIAGNOSIS — G894 Chronic pain syndrome: Secondary | ICD-10-CM | POA: Diagnosis not present

## 2016-06-09 DIAGNOSIS — Z433 Encounter for attention to colostomy: Secondary | ICD-10-CM | POA: Diagnosis not present

## 2016-06-09 DIAGNOSIS — Z933 Colostomy status: Secondary | ICD-10-CM | POA: Diagnosis not present

## 2016-06-18 DIAGNOSIS — G8929 Other chronic pain: Secondary | ICD-10-CM | POA: Diagnosis not present

## 2016-06-18 DIAGNOSIS — F329 Major depressive disorder, single episode, unspecified: Secondary | ICD-10-CM | POA: Diagnosis not present

## 2016-07-03 DIAGNOSIS — M79671 Pain in right foot: Secondary | ICD-10-CM | POA: Diagnosis not present

## 2016-07-03 DIAGNOSIS — E7800 Pure hypercholesterolemia, unspecified: Secondary | ICD-10-CM | POA: Diagnosis not present

## 2016-07-03 DIAGNOSIS — M0570 Rheumatoid arthritis with rheumatoid factor of unspecified site without organ or systems involvement: Secondary | ICD-10-CM | POA: Diagnosis not present

## 2016-07-03 DIAGNOSIS — M79641 Pain in right hand: Secondary | ICD-10-CM | POA: Diagnosis not present

## 2016-07-03 DIAGNOSIS — Z79899 Other long term (current) drug therapy: Secondary | ICD-10-CM | POA: Diagnosis not present

## 2016-07-03 DIAGNOSIS — M79672 Pain in left foot: Secondary | ICD-10-CM | POA: Diagnosis not present

## 2016-07-07 DIAGNOSIS — M25511 Pain in right shoulder: Secondary | ICD-10-CM | POA: Diagnosis not present

## 2016-07-07 DIAGNOSIS — M0570 Rheumatoid arthritis with rheumatoid factor of unspecified site without organ or systems involvement: Secondary | ICD-10-CM | POA: Diagnosis not present

## 2016-07-07 DIAGNOSIS — R7989 Other specified abnormal findings of blood chemistry: Secondary | ICD-10-CM | POA: Diagnosis not present

## 2016-07-07 DIAGNOSIS — Z79899 Other long term (current) drug therapy: Secondary | ICD-10-CM | POA: Diagnosis not present

## 2016-07-07 DIAGNOSIS — F5104 Psychophysiologic insomnia: Secondary | ICD-10-CM | POA: Diagnosis not present

## 2016-07-07 DIAGNOSIS — M25512 Pain in left shoulder: Secondary | ICD-10-CM | POA: Diagnosis not present

## 2016-08-11 DIAGNOSIS — G8929 Other chronic pain: Secondary | ICD-10-CM | POA: Diagnosis not present

## 2016-08-11 DIAGNOSIS — M069 Rheumatoid arthritis, unspecified: Secondary | ICD-10-CM | POA: Diagnosis not present

## 2016-08-11 DIAGNOSIS — I10 Essential (primary) hypertension: Secondary | ICD-10-CM | POA: Diagnosis not present

## 2016-08-11 DIAGNOSIS — G894 Chronic pain syndrome: Secondary | ICD-10-CM | POA: Diagnosis not present

## 2016-08-11 DIAGNOSIS — Z720 Tobacco use: Secondary | ICD-10-CM | POA: Diagnosis not present

## 2016-08-11 DIAGNOSIS — E7800 Pure hypercholesterolemia, unspecified: Secondary | ICD-10-CM | POA: Diagnosis not present

## 2016-08-11 DIAGNOSIS — F5105 Insomnia due to other mental disorder: Secondary | ICD-10-CM | POA: Diagnosis not present

## 2016-08-11 DIAGNOSIS — F329 Major depressive disorder, single episode, unspecified: Secondary | ICD-10-CM | POA: Diagnosis not present

## 2016-09-09 DIAGNOSIS — Z933 Colostomy status: Secondary | ICD-10-CM | POA: Diagnosis not present

## 2016-09-09 DIAGNOSIS — Z433 Encounter for attention to colostomy: Secondary | ICD-10-CM | POA: Diagnosis not present

## 2016-09-17 DIAGNOSIS — M2550 Pain in unspecified joint: Secondary | ICD-10-CM | POA: Diagnosis not present

## 2016-09-17 DIAGNOSIS — Z720 Tobacco use: Secondary | ICD-10-CM | POA: Diagnosis not present

## 2016-09-17 DIAGNOSIS — G894 Chronic pain syndrome: Secondary | ICD-10-CM | POA: Diagnosis not present

## 2016-09-17 DIAGNOSIS — F329 Major depressive disorder, single episode, unspecified: Secondary | ICD-10-CM | POA: Diagnosis not present

## 2016-09-17 DIAGNOSIS — M069 Rheumatoid arthritis, unspecified: Secondary | ICD-10-CM | POA: Diagnosis not present

## 2016-10-02 DIAGNOSIS — E785 Hyperlipidemia, unspecified: Secondary | ICD-10-CM | POA: Diagnosis not present

## 2016-10-02 DIAGNOSIS — M545 Low back pain: Secondary | ICD-10-CM | POA: Diagnosis not present

## 2016-10-02 DIAGNOSIS — M069 Rheumatoid arthritis, unspecified: Secondary | ICD-10-CM | POA: Diagnosis not present

## 2016-10-02 DIAGNOSIS — K219 Gastro-esophageal reflux disease without esophagitis: Secondary | ICD-10-CM | POA: Diagnosis not present

## 2016-10-02 DIAGNOSIS — F330 Major depressive disorder, recurrent, mild: Secondary | ICD-10-CM | POA: Diagnosis not present

## 2016-10-02 DIAGNOSIS — G4700 Insomnia, unspecified: Secondary | ICD-10-CM | POA: Diagnosis not present

## 2016-10-02 DIAGNOSIS — G629 Polyneuropathy, unspecified: Secondary | ICD-10-CM | POA: Diagnosis not present

## 2016-10-02 DIAGNOSIS — I10 Essential (primary) hypertension: Secondary | ICD-10-CM | POA: Diagnosis not present

## 2016-10-02 DIAGNOSIS — E663 Overweight: Secondary | ICD-10-CM | POA: Diagnosis not present

## 2016-10-15 DIAGNOSIS — G894 Chronic pain syndrome: Secondary | ICD-10-CM | POA: Diagnosis not present

## 2016-10-15 DIAGNOSIS — F329 Major depressive disorder, single episode, unspecified: Secondary | ICD-10-CM | POA: Diagnosis not present

## 2016-10-15 DIAGNOSIS — M2550 Pain in unspecified joint: Secondary | ICD-10-CM | POA: Diagnosis not present

## 2016-10-15 DIAGNOSIS — Z720 Tobacco use: Secondary | ICD-10-CM | POA: Diagnosis not present

## 2016-10-15 DIAGNOSIS — M069 Rheumatoid arthritis, unspecified: Secondary | ICD-10-CM | POA: Diagnosis not present

## 2016-10-22 DIAGNOSIS — F5104 Psychophysiologic insomnia: Secondary | ICD-10-CM | POA: Diagnosis not present

## 2016-10-22 DIAGNOSIS — M7531 Calcific tendinitis of right shoulder: Secondary | ICD-10-CM | POA: Diagnosis not present

## 2016-10-22 DIAGNOSIS — R7989 Other specified abnormal findings of blood chemistry: Secondary | ICD-10-CM | POA: Diagnosis not present

## 2016-10-22 DIAGNOSIS — M0570 Rheumatoid arthritis with rheumatoid factor of unspecified site without organ or systems involvement: Secondary | ICD-10-CM | POA: Diagnosis not present

## 2016-10-22 DIAGNOSIS — Z79899 Other long term (current) drug therapy: Secondary | ICD-10-CM | POA: Diagnosis not present

## 2016-11-06 DIAGNOSIS — F329 Major depressive disorder, single episode, unspecified: Secondary | ICD-10-CM | POA: Diagnosis not present

## 2016-11-06 DIAGNOSIS — E7800 Pure hypercholesterolemia, unspecified: Secondary | ICD-10-CM | POA: Diagnosis not present

## 2016-11-06 DIAGNOSIS — Z79899 Other long term (current) drug therapy: Secondary | ICD-10-CM | POA: Diagnosis not present

## 2016-11-06 DIAGNOSIS — I10 Essential (primary) hypertension: Secondary | ICD-10-CM | POA: Diagnosis not present

## 2016-11-19 DIAGNOSIS — M069 Rheumatoid arthritis, unspecified: Secondary | ICD-10-CM | POA: Diagnosis not present

## 2016-11-19 DIAGNOSIS — F329 Major depressive disorder, single episode, unspecified: Secondary | ICD-10-CM | POA: Diagnosis not present

## 2016-11-19 DIAGNOSIS — M2550 Pain in unspecified joint: Secondary | ICD-10-CM | POA: Diagnosis not present

## 2016-11-19 DIAGNOSIS — G894 Chronic pain syndrome: Secondary | ICD-10-CM | POA: Diagnosis not present

## 2016-11-19 DIAGNOSIS — F1120 Opioid dependence, uncomplicated: Secondary | ICD-10-CM | POA: Diagnosis not present

## 2016-11-19 DIAGNOSIS — Z720 Tobacco use: Secondary | ICD-10-CM | POA: Diagnosis not present

## 2016-12-19 DIAGNOSIS — G894 Chronic pain syndrome: Secondary | ICD-10-CM | POA: Diagnosis not present

## 2016-12-19 DIAGNOSIS — M2550 Pain in unspecified joint: Secondary | ICD-10-CM | POA: Diagnosis not present

## 2016-12-19 DIAGNOSIS — Z79891 Long term (current) use of opiate analgesic: Secondary | ICD-10-CM | POA: Diagnosis not present

## 2016-12-19 DIAGNOSIS — M069 Rheumatoid arthritis, unspecified: Secondary | ICD-10-CM | POA: Diagnosis not present

## 2016-12-19 DIAGNOSIS — F329 Major depressive disorder, single episode, unspecified: Secondary | ICD-10-CM | POA: Diagnosis not present

## 2016-12-19 DIAGNOSIS — Z720 Tobacco use: Secondary | ICD-10-CM | POA: Diagnosis not present

## 2017-01-06 ENCOUNTER — Encounter (HOSPITAL_COMMUNITY): Payer: Self-pay | Admitting: Emergency Medicine

## 2017-01-06 DIAGNOSIS — I714 Abdominal aortic aneurysm, without rupture: Secondary | ICD-10-CM | POA: Diagnosis not present

## 2017-01-06 DIAGNOSIS — F17210 Nicotine dependence, cigarettes, uncomplicated: Secondary | ICD-10-CM | POA: Diagnosis not present

## 2017-01-06 DIAGNOSIS — R1084 Generalized abdominal pain: Secondary | ICD-10-CM | POA: Diagnosis present

## 2017-01-06 DIAGNOSIS — R6889 Other general symptoms and signs: Secondary | ICD-10-CM | POA: Diagnosis not present

## 2017-01-06 DIAGNOSIS — Z79899 Other long term (current) drug therapy: Secondary | ICD-10-CM | POA: Diagnosis not present

## 2017-01-06 DIAGNOSIS — K8020 Calculus of gallbladder without cholecystitis without obstruction: Secondary | ICD-10-CM | POA: Diagnosis not present

## 2017-01-06 LAB — COMPREHENSIVE METABOLIC PANEL
ALT: 55 U/L (ref 17–63)
AST: 100 U/L — ABNORMAL HIGH (ref 15–41)
Albumin: 3.7 g/dL (ref 3.5–5.0)
Alkaline Phosphatase: 49 U/L (ref 38–126)
Anion gap: 14 (ref 5–15)
BUN: 9 mg/dL (ref 6–20)
CO2: 16 mmol/L — ABNORMAL LOW (ref 22–32)
Calcium: 9.2 mg/dL (ref 8.9–10.3)
Chloride: 104 mmol/L (ref 101–111)
Creatinine, Ser: 0.92 mg/dL (ref 0.61–1.24)
GFR calc Af Amer: >60 mL/min (ref >60)
GFR calc non Af Amer: >60 mL/min (ref >60)
Glucose, Bld: 111 mg/dL — ABNORMAL HIGH (ref 65–99)
Potassium: 3.6 mmol/L (ref 3.5–5.1)
Sodium: 134 mmol/L — ABNORMAL LOW (ref 135–145)
Total Bilirubin: 0.7 mg/dL (ref 0.3–1.2)
Total Protein: 7.9 g/dL (ref 6.5–8.1)

## 2017-01-06 LAB — CBC
HCT: 44.9 % (ref 39.0–52.0)
Hemoglobin: 15.6 g/dL (ref 13.0–17.0)
MCH: 33 pg (ref 26.0–34.0)
MCHC: 34.7 g/dL (ref 30.0–36.0)
MCV: 94.9 fL (ref 78.0–100.0)
Platelets: 181 10*3/uL (ref 150–400)
RBC: 4.73 MIL/uL (ref 4.22–5.81)
RDW: 13.9 % (ref 11.5–15.5)
WBC: 10.8 10*3/uL — ABNORMAL HIGH (ref 4.0–10.5)

## 2017-01-06 LAB — LIPASE, BLOOD: Lipase: 29 U/L (ref 11–51)

## 2017-01-06 NOTE — ED Triage Notes (Signed)
Pt reports dog stepped on abdomen where stoma/colostomy is, and noted redness and pain around stoma site. Has not noticed any change to output.

## 2017-01-07 ENCOUNTER — Emergency Department (HOSPITAL_COMMUNITY): Payer: Medicare PPO

## 2017-01-07 ENCOUNTER — Emergency Department (HOSPITAL_COMMUNITY)
Admission: EM | Admit: 2017-01-07 | Discharge: 2017-01-07 | Disposition: A | Discharge status: Home / Self Care | Payer: Medicare PPO | Attending: Emergency Medicine | Admitting: Emergency Medicine

## 2017-01-07 DIAGNOSIS — I714 Abdominal aortic aneurysm, without rupture, unspecified: Secondary | ICD-10-CM

## 2017-01-07 DIAGNOSIS — R6889 Other general symptoms and signs: Secondary | ICD-10-CM

## 2017-01-07 DIAGNOSIS — K8020 Calculus of gallbladder without cholecystitis without obstruction: Secondary | ICD-10-CM | POA: Diagnosis not present

## 2017-01-07 LAB — URINALYSIS, ROUTINE W REFLEX MICROSCOPIC
Bilirubin Urine: NEGATIVE
Glucose, UA: NEGATIVE mg/dL
Hgb urine dipstick: NEGATIVE
Ketones, ur: NEGATIVE mg/dL
Leukocytes, UA: NEGATIVE
Nitrite: NEGATIVE
Protein, ur: NEGATIVE mg/dL
Specific Gravity, Urine: 1.031 — ABNORMAL HIGH (ref 1.005–1.030)
pH: 5 (ref 5.0–8.0)

## 2017-01-07 IMAGING — CT CT ABDOMEN W/ CM
2 of 5 series · 15 of 46 positions shown, 17 images · IV contrast (Omni 300)
Comparison: Prior CT from [DATE].

CLINICAL DATA: Initial evaluation for acute ostomy pain, redness.

EXAM:
CT ABDOMEN WITH CONTRAST
TECHNIQUE: Multidetector CT imaging of the abdomen was performed using the
standard protocol following bolus administration of intravenous
contrast.
CONTRAST:  100mL [3B] IOPAMIDOL ([3B]) INJECTION 61%

[Series 3: a/p w/ 5mm · axial · 0.98mm/px · z∈[+1041,+1301]mm · 12 of 60 slices shown, 14 images]
[im 4/60  soft-tissue]
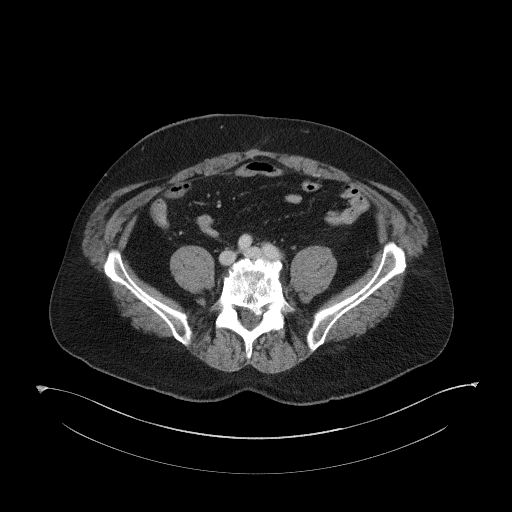
[im 4/60  bone]
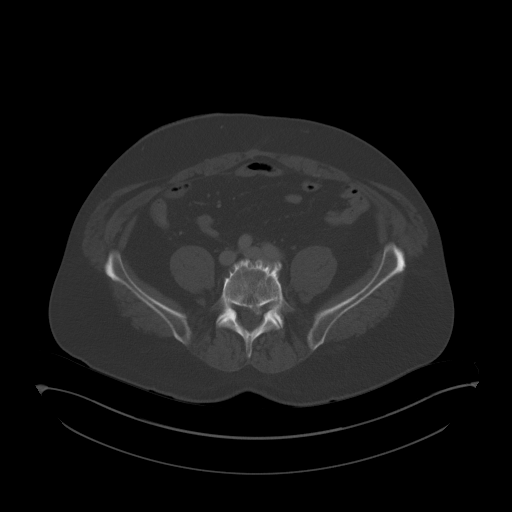
[im 8/60  soft-tissue]
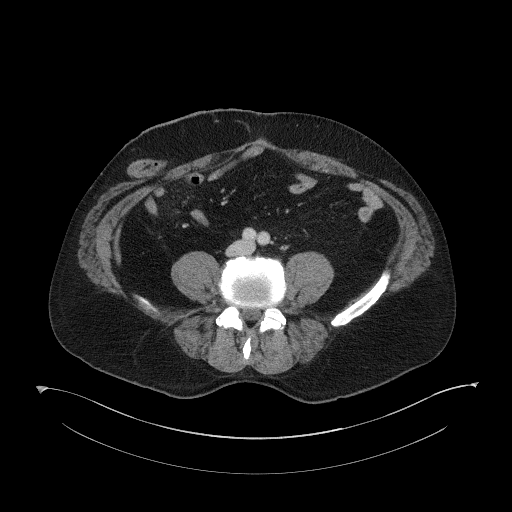
[im 12/60  soft-tissue]
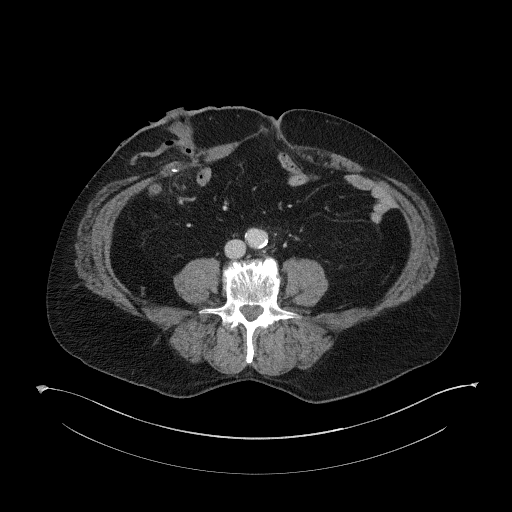
[im 20/60  soft-tissue]
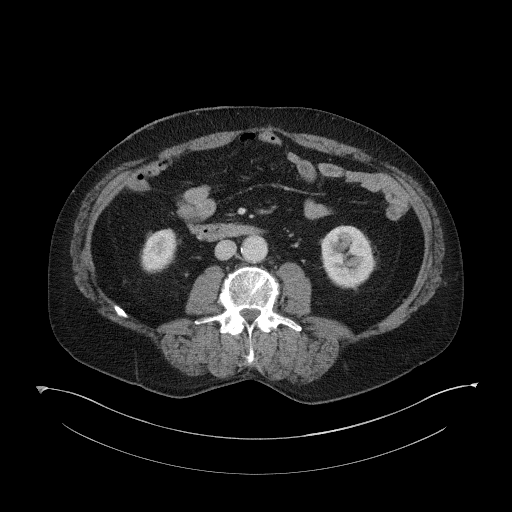
[im 24/60  soft-tissue]
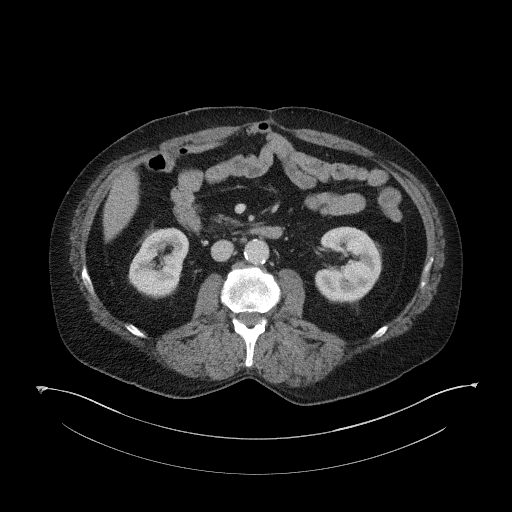
[im 28/60  soft-tissue]
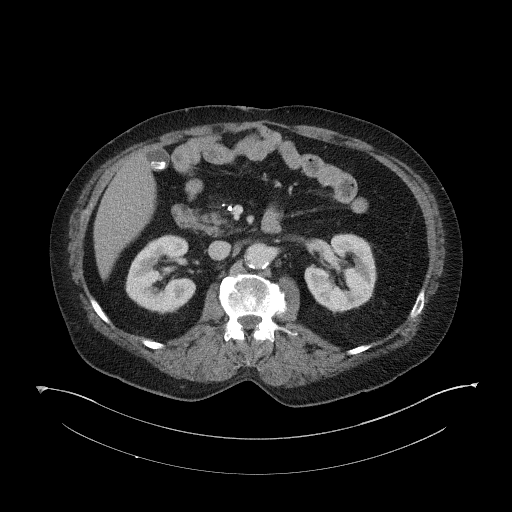
[im 32/60  soft-tissue]
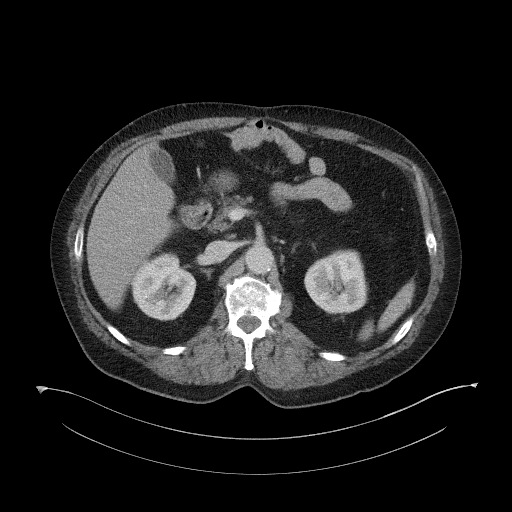
[im 36/60  soft-tissue]
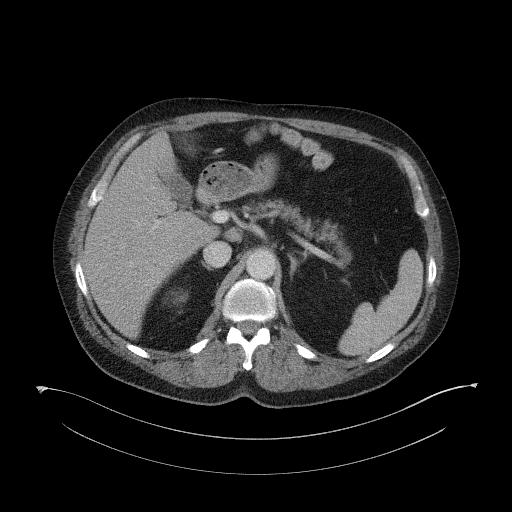
[im 40/60  soft-tissue]
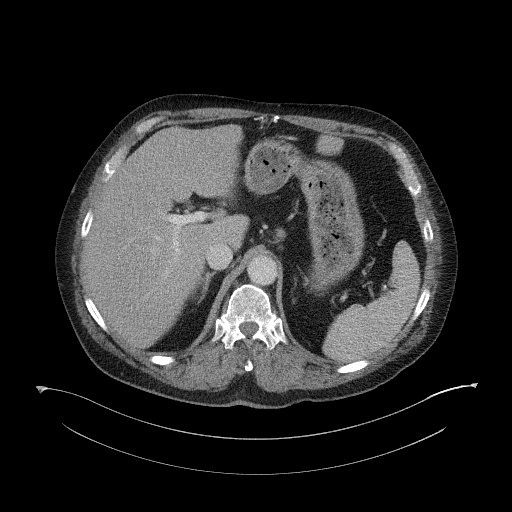
[im 40/60  bone]
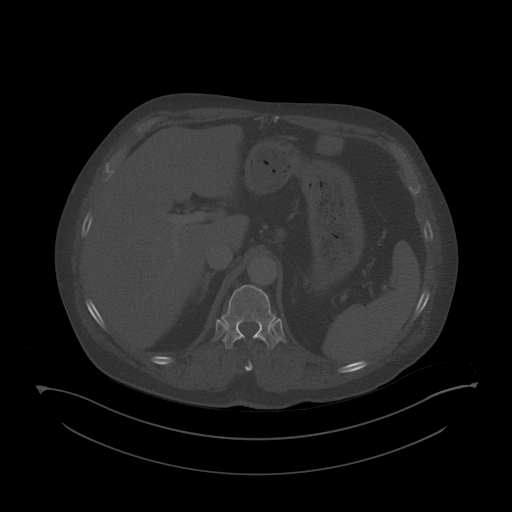
[im 48/60  soft-tissue]
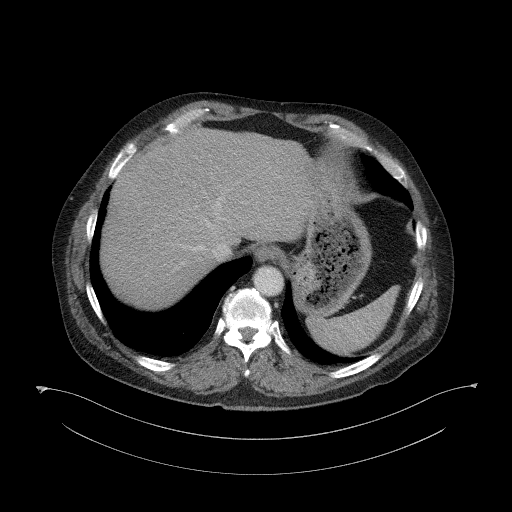
[im 52/60  soft-tissue]
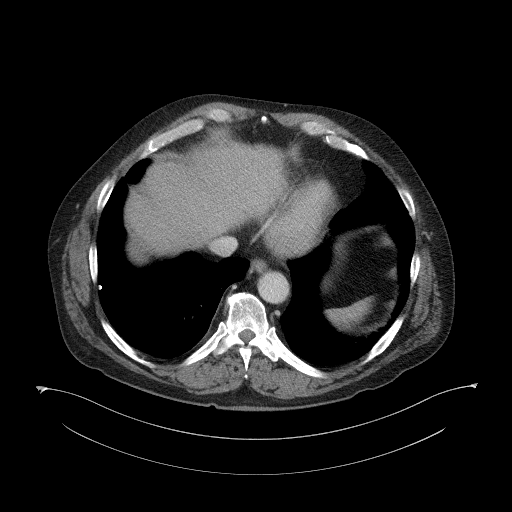
[im 56/60  soft-tissue]
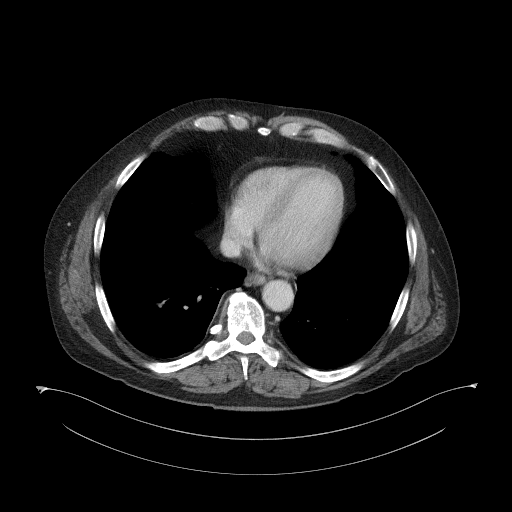

[Series 6: a/p w/ cor · coronal · 0.59mm/px · 3 of 162 slices shown]
[im 54/162  soft-tissue]
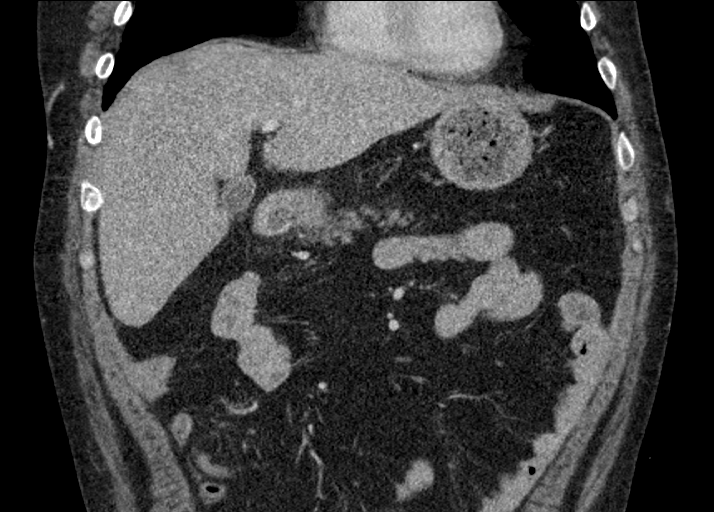
[im 72/162  soft-tissue]
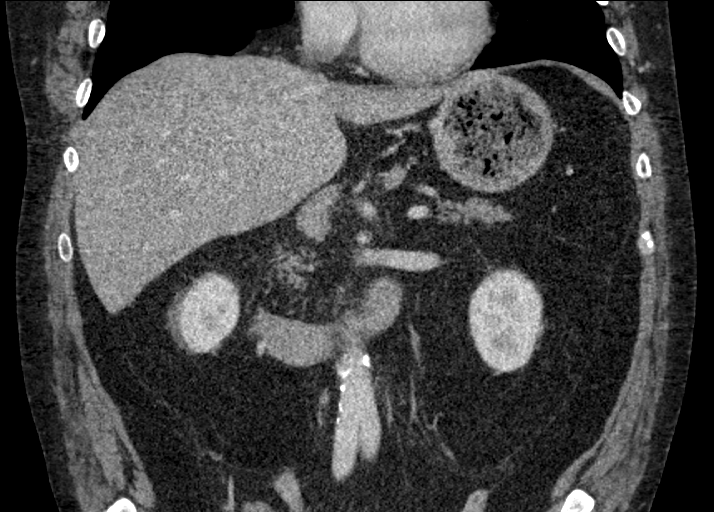
[im 90/162  soft-tissue]
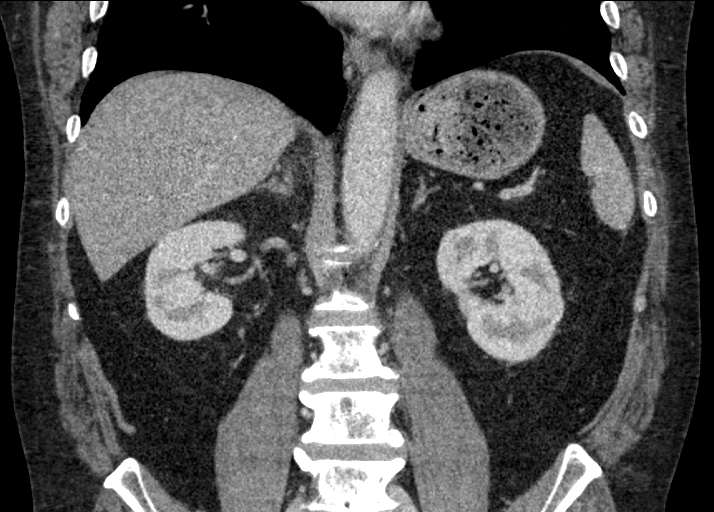

[15 of 46 positions shown; findings below may reference images not displayed]

FINDINGS: Lower chest: Visualized lung bases are clear. Few subcentimeter
calcified granulomas noted.

Hepatobiliary: Liver within normal limits. Few scattered calcified
stones noted within the gallbladder lumen. No evidence for acute
cholecystitis. No biliary dilatation.

Pancreas: Pancreas within normal limits.

Spleen: Spleen within normal limits.

Adrenals/Urinary Tract: Adrenal glands are normal. Kidneys equal in
size with symmetric enhancement. No nephrolithiasis, hydronephrosis,
or focal enhancing renal mass. Visualized ureters within normal
limits.

Stomach/Bowel: Stomach normal. No evidence for bowel obstruction.
Right lower quadrant ileostomy present. Small parastomal hernia
containing fat and a loop of small bowel without associated
obstruction. Minimal hazy stranding present within the herniated fat
without additional significant inflammatory changes. No loculated
collection. No other significant inflammatory changes about the
bowels.

Vascular/Lymphatic: Moderate aorto bi-iliac atherosclerotic disease.
Infrarenal aorta ectatic up to 2.7 cm. 15 mm porta hepatis node
noted. 14 mm gastrohepatic node. No other adenopathy.

Other: No free air or fluid. Additional fat containing paraumbilical
hernia noted without associated inflammation.

Musculoskeletal: Visualized osseous structures within normal limits.
No acute osseus abnormality. No worrisome lytic or blastic osseous
lesions.
IMPRESSION: 1. Right lower quadrant ostomy in place. Small associated parastomal
hernia containing fat and a loop of small bowel without evidence for
obstruction or significant inflammatory changes. No other
significant inflammatory changes about the ostomy itself.
2. No other acute intra-abdominal or pelvic process.
3. Cholelithiasis.
4. Few mildly enlarged porta hepatis and gastrohepatic lymph nodes
measuring up to 15 mm, indeterminate, but may be reactive.
5. **An incidental finding of potential clinical significance has
been found. Ectatic infrarenal aorta measuring up to 2.7 cm, at risk
for aneurysm development. Recommend followup by ultrasound in 5
years. This recommendation follows ACR consensus guidelines: White
Paper of the ACR Incidental Findings Committee II on Vascular
Findings. [HOSPITAL] [3B]; [DATE].**

## 2017-01-07 MED ORDER — BACITRACIN ZINC 500 UNIT/GM EX OINT: 1.0000 "application " | TOPICAL_OINTMENT | Freq: Two times a day (BID) | CUTANEOUS | 0 refills | Status: DC

## 2017-01-07 MED ORDER — CEPHALEXIN 500 MG PO CAPS: 500.0000 mg | ORAL_CAPSULE | Freq: Four times a day (QID) | ORAL | 0 refills | Status: DC

## 2017-01-07 MED ORDER — HYDROCODONE-ACETAMINOPHEN 5-325 MG PO TABS
1.0000 | ORAL_TABLET | Freq: Once | ORAL | Status: AC
  Administered 2017-01-07: 1 via ORAL
  Filled 2017-01-07: qty 1

## 2017-01-07 MED ORDER — IOPAMIDOL (ISOVUE-300) INJECTION 61%
INTRAVENOUS | Status: AC
  Administered 2017-01-07: 100 mL
  Filled 2017-01-07: qty 100

## 2017-01-07 NOTE — ED Notes (Addendum)
Pt. resting with no distress , respirations unlabored , IV site intact .

## 2017-01-07 NOTE — ED Provider Notes (Signed)
[facility] DEPT Provider Note   CSN: [id number] Arrival date & time: 01/06/17  2216  By signing my name below, I, Margit Banda, attest that this documentation has been prepared under the direction and in the presence of Varney Biles, MD. Electronically Signed: Margit Banda, ED Scribe. 01/07/17. 3:44 AM.  History   Chief Complaint Chief Complaint  Patient presents with  . Abdominal Pain    HPI Eddie Taylor is a 61 y.o. male with a PMHx of diverticulitis who presents to the Emergency Department complaining of gradually worsening "horrendous" abdominal pain/ rash that started ~ three weeks ago. Pt reports his dog stepped on his abdomen where stoma/ colostomy is and notes it felt like it went right into his abdomen. About two weeks ago he noticed redness to area. Associated sx include chills and diaphoresis. Certain movement exacerbates his pain. Pt denies fever.  The history is provided by the patient and the spouse. No language interpreter was used.    Past Medical History:  Diagnosis Date  . Arthritis   . Diverticulitis   . Gastric ulcer     Patient Active Problem List   Diagnosis Date Noted  . H pylori ulcer 03/14/2013  . Abdominal pain, unspecified site 03/13/2013  . Peptic ulcer disease 03/13/2013  . Cholelithiases 03/13/2013  . Melena 03/13/2013    Past Surgical History:  Procedure Laterality Date  . COLON SURGERY    . COLOSTOMY    . ESOPHAGOGASTRODUODENOSCOPY N/A 03/13/2013   Procedure: ESOPHAGOGASTRODUODENOSCOPY (EGD);  Surgeon: Lear Ng, MD;  Location: [facility] ENDOSCOPY;  Service: Endoscopy;  Laterality: N/A;       Home Medications    Prior to Admission medications   Medication Sig Start Date End Date Taking? Authorizing Provider  amitriptyline (ELAVIL) 50 MG tablet Take 50 mg by mouth at bedtime.   Yes [provider]  buPROPion (WELLBUTRIN XL) 300 MG 24 hr tablet Take 300 mg by mouth daily. 12/05/16  Yes [provider]    cyclobenzaprine (FLEXERIL) 10 MG tablet Take 10 mg by mouth 2 (two) times daily as needed for muscle spasms.  12/05/16  Yes [provider]  gabapentin (NEURONTIN) 300 MG capsule Take 900 mg by mouth 3 (three) times daily. 01/02/17  Yes [provider]  HUMIRA 40 MG/0.8ML PSKT Inject 40 mg into the muscle every 14 (fourteen) days. 01/03/17  Yes [provider]  HYDROcodone-acetaminophen (NORCO) 7.5-325 MG per tablet Take 1 tablet by mouth every 6 (six) hours as needed for pain.   Yes [provider]  metoprolol succinate (TOPROL-XL) 25 MG 24 hr tablet Take 25 mg by mouth daily. 01/02/17  Yes [provider]  pantoprazole (PROTONIX) 40 MG tablet Take 1 tablet (40 mg total) by mouth 2 (two) times daily with a meal. 03/16/13  Yes Thurnell Lose, MD  pravastatin (PRAVACHOL) 40 MG tablet Take 40 mg by mouth daily. 12/05/16  Yes [provider]  predniSONE (DELTASONE) 10 MG tablet Take 10 mg by mouth daily. 10/22/16  Yes [provider]  VASCEPA 1 g CAPS Take 2 g by mouth daily. 12/25/16  Yes [provider]  venlafaxine XR (EFFEXOR-XR) 75 MG 24 hr capsule Take 225 mg by mouth daily. 11/05/16  Yes [provider]  zolpidem (AMBIEN) 10 MG tablet Take 10 mg by mouth at bedtime as needed for sleep.   Yes [provider]  bacitracin ointment Apply 1 application topically 2 (two) times daily. 01/07/17   Varney Biles, MD  cephALEXin (KEFLEX) 500 MG capsule Take 1 capsule (500 mg total) by mouth 4 (four) times daily. 01/07/17   Varney Biles, MD  doxycycline (VIBRAMYCIN) 100 MG capsule Take 1 capsule (100 mg total) by mouth 2 (two) times daily. Patient not taking: Reported on 01/07/2017 05/06/13   Antonietta Breach, PA-C  sucralfate (CARAFATE) 1 G tablet Take 1 tablet (1 g total) by mouth 4 (four) times daily. Patient not taking: Reported on 01/07/2017 03/16/13   Thurnell Lose, MD    Family History No family history on  file.  Social History Social History  Substance Use Topics  . Smoking status: Current Every Day Smoker  . Smokeless tobacco: Never Used  . Alcohol use Yes     Allergies   Morphine and related; Prozac [fluoxetine hcl]; and Sulfa antibiotics   Review of Systems Review of Systems  Constitutional: Positive for chills and diaphoresis. Negative for fever.  Gastrointestinal: Positive for abdominal pain.  Skin: Positive for rash.  All other systems reviewed and are negative.  Physical Exam Updated Vital Signs BP 114/84 (BP Location: Left Arm)   Pulse 82   Temp 97.8 F (36.6 C) (Oral)   Resp 16   Ht 6' (1.829 m)   Wt 101.2 kg (223 lb)   SpO2 99%   BMI 30.24 kg/m   Physical Exam  Constitutional: He is oriented to person, place, and time. He appears well-developed and well-nourished.  HENT:  Head: Normocephalic.  Eyes: EOM are normal.  Neck: Normal range of motion.  Pulmonary/Chest: Effort normal.  Abdominal: He exhibits no distension.  No significant tenderness. + erythematous around colostomy site. Raised erythematic papules.   Musculoskeletal: Normal range of motion.  Neurological: He is alert and oriented to person, place, and time.  Psychiatric: He has a normal mood and affect.  Nursing note and vitals reviewed.        ED Treatments / Results  DIAGNOSTIC STUDIES: Oxygen Saturation is 100% on RA, normal by my interpretation.   COORDINATION OF CARE: 3:44 AM-Discussed next steps with pt which includes starting antibiotics and a CT scan. Pt verbalized understanding and is agreeable with the plan.   Labs (all labs ordered are listed, but only abnormal results are displayed) Labs Reviewed  COMPREHENSIVE METABOLIC PANEL - Abnormal; Notable for the following:       Result Value   Sodium 134 (*)    CO2 16 (*)    Glucose, Bld 111 (*)    AST 100 (*)    All other components within normal limits  CBC - Abnormal; Notable for the following:    WBC 10.8 (*)     All other components within normal limits  URINALYSIS, ROUTINE W REFLEX MICROSCOPIC - Abnormal; Notable for the following:    Color, Urine AMBER (*)    APPearance HAZY (*)    Specific Gravity, Urine 1.031 (*)    All other components within normal limits  LIPASE, BLOOD    EKG  EKG Interpretation None       Radiology Ct Abdomen W Contrast  Result Date: 01/07/2017 CLINICAL DATA:  Initial evaluation for acute ostomy pain, redness. EXAM: CT ABDOMEN WITH CONTRAST TECHNIQUE: Multidetector CT imaging of the abdomen was performed using the standard protocol following bolus administration of intravenous contrast. CONTRAST:  113mL ISOVUE-300 IOPAMIDOL (ISOVUE-300) INJECTION 61% COMPARISON:  Prior CT from 03/13/2013. FINDINGS: Lower chest: Visualized lung bases are clear. Few subcentimeter calcified granulomas noted. Hepatobiliary: Liver within normal limits. Few scattered calcified stones noted within  the gallbladder lumen. No evidence for acute cholecystitis. No biliary dilatation. Pancreas: Pancreas within normal limits. Spleen: Spleen within normal limits. Adrenals/Urinary Tract: Adrenal glands are normal. Kidneys equal in size with symmetric enhancement. No nephrolithiasis, hydronephrosis, or focal enhancing renal mass. Visualized ureters within normal limits. Stomach/Bowel: Stomach normal. No evidence for bowel obstruction. Right lower quadrant ileostomy present. Small parastomal hernia containing fat and a loop of small bowel without associated obstruction. Minimal hazy stranding present within the herniated fat without additional significant inflammatory changes. No loculated collection. No other significant inflammatory changes about the bowels. Vascular/Lymphatic: Moderate aorto bi-iliac atherosclerotic disease. Infrarenal aorta ectatic up to 2.7 cm. 15 mm porta hepatis node noted. 14 mm gastrohepatic node. No other adenopathy. Other: No free air or fluid. Additional fat containing paraumbilical  hernia noted without associated inflammation. Musculoskeletal: Visualized osseous structures within normal limits. No acute osseus abnormality. No worrisome lytic or blastic osseous lesions. IMPRESSION: 1. Right lower quadrant ostomy in place. Small associated parastomal hernia containing fat and a loop of small bowel without evidence for obstruction or significant inflammatory changes. No other significant inflammatory changes about the ostomy itself. 2. No other acute intra-abdominal or pelvic process. 3. Cholelithiasis. 4. Few mildly enlarged porta hepatis and gastrohepatic lymph nodes measuring up to 15 mm, indeterminate, but may be reactive. 5. **An incidental finding of potential clinical significance has been found. Ectatic infrarenal aorta measuring up to 2.7 cm, at risk for aneurysm development. Recommend followup by ultrasound in 5 years. This recommendation follows ACR consensus guidelines: White Paper of the ACR Incidental Findings Committee II on Vascular Findings. J Am Coll Radiol 2013; 10:789-794.** Electronically Signed   By: Jeannine Boga M.D.   On: 01/07/2017 04:56    Procedures Procedures (including critical care time)  Medications Ordered in ED Medications  iopamidol (ISOVUE-300) 61 % injection (100 mLs  Contrast Given 01/07/17 0403)  HYDROcodone-acetaminophen (NORCO/VICODIN) 5-325 MG per tablet 1 tablet (1 tablet Oral Given 01/07/17 1610)     Initial Impression / Assessment and Plan / ED Course  I have reviewed the triage vital signs and the nursing notes.  Pertinent labs & imaging results that were available during my care of the patient were reviewed by me and considered in my medical decision making (see chart for details).  Clinical Course as of Jan 07 718  Wed Jan 07, 2017  0716 Results from the ER workup discussed with the patient face to face and all questions answered to the best of my ability.  AAA also discussed. Antibiotics for now. Asked him to see  [facility] CT Abdomen W Contrast [AN]    Clinical Course User Index [AN] Varney Biles, MD    Pt comes in with cc of abd pain. Pt has hx of ostomy. Reports his dog stepped on his abdomen, and since then he has had severe abd pain,  Mostly when he bends. Pt also has noted new redness around his ostomy site. Pt also c/o sweats / chills.  Clinical exam is not concerning for deep infection. ? Dermatitis vs. Cellulitis for the new redness.   I discussed my findings with the patient, and informed them that the plan would be to treat him as cellulitis due to his other constitutionals (chills, sweats) and not to get CT. If he gets worse, he could return to the ER and get CT if needed. Pt and wife really concerned about the positional pain - so we decided to get CT scan.   Final Clinical Impressions(s) /  ED Diagnoses   Final diagnoses:  Suspected soft tissue infection  Abdominal aortic aneurysm (AAA) without rupture [facility])    New Prescriptions Discharge Medication List as of 01/07/2017  6:52 AM    START taking these medications   Details  bacitracin ointment Apply 1 application topically 2 (two) times daily., Starting Wed 01/07/2017, Print    cephALEXin (KEFLEX) 500 MG capsule Take 1 capsule (500 mg total) by mouth 4 (four) times daily., Starting Wed 01/07/2017, Print       I personally performed the services described in this documentation, which was scribed in my presence. The recorded information has been reviewed and is accurate.     Varney Biles, MD 01/07/17 [phone]

## 2017-01-07 NOTE — Discharge Instructions (Signed)
Please see the surgeons in 1 -2 weeks.

## 2017-01-13 DIAGNOSIS — Z933 Colostomy status: Secondary | ICD-10-CM | POA: Diagnosis not present

## 2017-01-13 DIAGNOSIS — Z433 Encounter for attention to colostomy: Secondary | ICD-10-CM | POA: Diagnosis not present

## 2017-01-15 DIAGNOSIS — F1120 Opioid dependence, uncomplicated: Secondary | ICD-10-CM | POA: Diagnosis not present

## 2017-01-15 DIAGNOSIS — M2550 Pain in unspecified joint: Secondary | ICD-10-CM | POA: Diagnosis not present

## 2017-01-15 DIAGNOSIS — Z79891 Long term (current) use of opiate analgesic: Secondary | ICD-10-CM | POA: Diagnosis not present

## 2017-01-15 DIAGNOSIS — M069 Rheumatoid arthritis, unspecified: Secondary | ICD-10-CM | POA: Diagnosis not present

## 2017-01-15 DIAGNOSIS — F329 Major depressive disorder, single episode, unspecified: Secondary | ICD-10-CM | POA: Diagnosis not present

## 2017-01-15 DIAGNOSIS — Z720 Tobacco use: Secondary | ICD-10-CM | POA: Diagnosis not present

## 2017-01-15 DIAGNOSIS — G894 Chronic pain syndrome: Secondary | ICD-10-CM | POA: Diagnosis not present

## 2017-01-20 DIAGNOSIS — Z432 Encounter for attention to ileostomy: Secondary | ICD-10-CM | POA: Diagnosis not present

## 2017-02-05 DIAGNOSIS — I10 Essential (primary) hypertension: Secondary | ICD-10-CM | POA: Diagnosis not present

## 2017-02-05 DIAGNOSIS — E7800 Pure hypercholesterolemia, unspecified: Secondary | ICD-10-CM | POA: Diagnosis not present

## 2017-02-05 DIAGNOSIS — F329 Major depressive disorder, single episode, unspecified: Secondary | ICD-10-CM | POA: Diagnosis not present

## 2017-02-05 DIAGNOSIS — F1010 Alcohol abuse, uncomplicated: Secondary | ICD-10-CM | POA: Diagnosis not present

## 2017-02-05 DIAGNOSIS — Z79899 Other long term (current) drug therapy: Secondary | ICD-10-CM | POA: Diagnosis not present

## 2017-02-05 DIAGNOSIS — Z933 Colostomy status: Secondary | ICD-10-CM | POA: Diagnosis not present

## 2017-02-09 DIAGNOSIS — B078 Other viral warts: Secondary | ICD-10-CM | POA: Diagnosis not present

## 2017-02-09 DIAGNOSIS — L919 Hypertrophic disorder of the skin, unspecified: Secondary | ICD-10-CM | POA: Diagnosis not present

## 2017-02-11 DIAGNOSIS — G894 Chronic pain syndrome: Secondary | ICD-10-CM | POA: Diagnosis not present

## 2017-02-11 DIAGNOSIS — F329 Major depressive disorder, single episode, unspecified: Secondary | ICD-10-CM | POA: Diagnosis not present

## 2017-02-11 DIAGNOSIS — Z720 Tobacco use: Secondary | ICD-10-CM | POA: Diagnosis not present

## 2017-02-11 DIAGNOSIS — M069 Rheumatoid arthritis, unspecified: Secondary | ICD-10-CM | POA: Diagnosis not present

## 2017-02-11 DIAGNOSIS — Z79891 Long term (current) use of opiate analgesic: Secondary | ICD-10-CM | POA: Diagnosis not present

## 2017-02-11 DIAGNOSIS — M2550 Pain in unspecified joint: Secondary | ICD-10-CM | POA: Diagnosis not present

## 2017-03-25 DIAGNOSIS — M069 Rheumatoid arthritis, unspecified: Secondary | ICD-10-CM | POA: Diagnosis not present

## 2017-03-25 DIAGNOSIS — F329 Major depressive disorder, single episode, unspecified: Secondary | ICD-10-CM | POA: Diagnosis not present

## 2017-03-25 DIAGNOSIS — Z79891 Long term (current) use of opiate analgesic: Secondary | ICD-10-CM | POA: Diagnosis not present

## 2017-03-25 DIAGNOSIS — M2550 Pain in unspecified joint: Secondary | ICD-10-CM | POA: Diagnosis not present

## 2017-03-25 DIAGNOSIS — G894 Chronic pain syndrome: Secondary | ICD-10-CM | POA: Diagnosis not present

## 2017-03-25 DIAGNOSIS — Z720 Tobacco use: Secondary | ICD-10-CM | POA: Diagnosis not present

## 2017-04-06 DIAGNOSIS — Z933 Colostomy status: Secondary | ICD-10-CM | POA: Diagnosis not present

## 2017-04-06 DIAGNOSIS — Z433 Encounter for attention to colostomy: Secondary | ICD-10-CM | POA: Diagnosis not present

## 2017-04-06 DIAGNOSIS — G250 Essential tremor: Secondary | ICD-10-CM | POA: Diagnosis not present

## 2017-04-22 DIAGNOSIS — R5383 Other fatigue: Secondary | ICD-10-CM | POA: Diagnosis not present

## 2017-04-22 DIAGNOSIS — Z79899 Other long term (current) drug therapy: Secondary | ICD-10-CM | POA: Diagnosis not present

## 2017-04-22 DIAGNOSIS — M0579 Rheumatoid arthritis with rheumatoid factor of multiple sites without organ or systems involvement: Secondary | ICD-10-CM | POA: Diagnosis not present

## 2017-04-22 DIAGNOSIS — E663 Overweight: Secondary | ICD-10-CM | POA: Diagnosis not present

## 2017-04-22 DIAGNOSIS — M2550 Pain in unspecified joint: Secondary | ICD-10-CM | POA: Diagnosis not present

## 2017-04-22 DIAGNOSIS — M545 Low back pain: Secondary | ICD-10-CM | POA: Diagnosis not present

## 2017-04-22 DIAGNOSIS — Z6829 Body mass index (BMI) 29.0-29.9, adult: Secondary | ICD-10-CM | POA: Diagnosis not present

## 2017-04-22 DIAGNOSIS — M150 Primary generalized (osteo)arthritis: Secondary | ICD-10-CM | POA: Diagnosis not present

## 2017-05-20 DIAGNOSIS — E669 Obesity, unspecified: Secondary | ICD-10-CM | POA: Diagnosis not present

## 2017-05-20 DIAGNOSIS — Z6832 Body mass index (BMI) 32.0-32.9, adult: Secondary | ICD-10-CM | POA: Diagnosis not present

## 2017-05-20 DIAGNOSIS — Z23 Encounter for immunization: Secondary | ICD-10-CM | POA: Diagnosis not present

## 2017-05-20 DIAGNOSIS — F17200 Nicotine dependence, unspecified, uncomplicated: Secondary | ICD-10-CM | POA: Diagnosis not present

## 2017-05-20 DIAGNOSIS — E7800 Pure hypercholesterolemia, unspecified: Secondary | ICD-10-CM | POA: Diagnosis not present

## 2017-05-20 DIAGNOSIS — F329 Major depressive disorder, single episode, unspecified: Secondary | ICD-10-CM | POA: Diagnosis not present

## 2017-05-20 DIAGNOSIS — Z0000 Encounter for general adult medical examination without abnormal findings: Secondary | ICD-10-CM | POA: Diagnosis not present

## 2017-05-20 DIAGNOSIS — M069 Rheumatoid arthritis, unspecified: Secondary | ICD-10-CM | POA: Diagnosis not present

## 2017-05-20 DIAGNOSIS — F5105 Insomnia due to other mental disorder: Secondary | ICD-10-CM | POA: Diagnosis not present

## 2017-05-20 DIAGNOSIS — I10 Essential (primary) hypertension: Secondary | ICD-10-CM | POA: Diagnosis not present

## 2017-05-27 DIAGNOSIS — M0579 Rheumatoid arthritis with rheumatoid factor of multiple sites without organ or systems involvement: Secondary | ICD-10-CM | POA: Diagnosis not present

## 2017-05-27 DIAGNOSIS — Z6830 Body mass index (BMI) 30.0-30.9, adult: Secondary | ICD-10-CM | POA: Diagnosis not present

## 2017-05-27 DIAGNOSIS — M150 Primary generalized (osteo)arthritis: Secondary | ICD-10-CM | POA: Diagnosis not present

## 2017-05-27 DIAGNOSIS — M1009 Idiopathic gout, multiple sites: Secondary | ICD-10-CM | POA: Diagnosis not present

## 2017-05-27 DIAGNOSIS — E669 Obesity, unspecified: Secondary | ICD-10-CM | POA: Diagnosis not present

## 2017-05-27 DIAGNOSIS — M2550 Pain in unspecified joint: Secondary | ICD-10-CM | POA: Diagnosis not present

## 2017-06-26 DIAGNOSIS — M1009 Idiopathic gout, multiple sites: Secondary | ICD-10-CM | POA: Diagnosis not present

## 2017-06-30 DIAGNOSIS — Z433 Encounter for attention to colostomy: Secondary | ICD-10-CM | POA: Diagnosis not present

## 2017-06-30 DIAGNOSIS — Z933 Colostomy status: Secondary | ICD-10-CM | POA: Diagnosis not present

## 2017-07-23 DIAGNOSIS — G250 Essential tremor: Secondary | ICD-10-CM | POA: Diagnosis not present

## 2017-07-23 DIAGNOSIS — I10 Essential (primary) hypertension: Secondary | ICD-10-CM | POA: Diagnosis not present

## 2017-07-23 DIAGNOSIS — Z125 Encounter for screening for malignant neoplasm of prostate: Secondary | ICD-10-CM | POA: Diagnosis not present

## 2017-07-23 DIAGNOSIS — R202 Paresthesia of skin: Secondary | ICD-10-CM | POA: Diagnosis not present

## 2017-07-23 DIAGNOSIS — F329 Major depressive disorder, single episode, unspecified: Secondary | ICD-10-CM | POA: Diagnosis not present

## 2017-07-23 DIAGNOSIS — E7800 Pure hypercholesterolemia, unspecified: Secondary | ICD-10-CM | POA: Diagnosis not present

## 2017-07-23 DIAGNOSIS — G8929 Other chronic pain: Secondary | ICD-10-CM | POA: Diagnosis not present

## 2017-07-28 DIAGNOSIS — Z79899 Other long term (current) drug therapy: Secondary | ICD-10-CM | POA: Diagnosis not present

## 2017-07-28 DIAGNOSIS — M1009 Idiopathic gout, multiple sites: Secondary | ICD-10-CM | POA: Diagnosis not present

## 2017-07-28 DIAGNOSIS — M150 Primary generalized (osteo)arthritis: Secondary | ICD-10-CM | POA: Diagnosis not present

## 2017-07-28 DIAGNOSIS — M0579 Rheumatoid arthritis with rheumatoid factor of multiple sites without organ or systems involvement: Secondary | ICD-10-CM | POA: Diagnosis not present

## 2017-07-28 DIAGNOSIS — Z6829 Body mass index (BMI) 29.0-29.9, adult: Secondary | ICD-10-CM | POA: Diagnosis not present

## 2017-07-28 DIAGNOSIS — E663 Overweight: Secondary | ICD-10-CM | POA: Diagnosis not present

## 2017-07-28 DIAGNOSIS — M2550 Pain in unspecified joint: Secondary | ICD-10-CM | POA: Diagnosis not present

## 2017-08-17 DIAGNOSIS — M5136 Other intervertebral disc degeneration, lumbar region: Secondary | ICD-10-CM | POA: Diagnosis not present

## 2017-08-17 DIAGNOSIS — Z79899 Other long term (current) drug therapy: Secondary | ICD-10-CM | POA: Diagnosis not present

## 2017-08-27 DIAGNOSIS — M0579 Rheumatoid arthritis with rheumatoid factor of multiple sites without organ or systems involvement: Secondary | ICD-10-CM | POA: Diagnosis not present

## 2017-08-31 DIAGNOSIS — M5136 Other intervertebral disc degeneration, lumbar region: Secondary | ICD-10-CM | POA: Diagnosis not present

## 2017-08-31 DIAGNOSIS — Z79899 Other long term (current) drug therapy: Secondary | ICD-10-CM | POA: Diagnosis not present

## 2017-09-28 DIAGNOSIS — M069 Rheumatoid arthritis, unspecified: Secondary | ICD-10-CM | POA: Diagnosis not present

## 2017-09-28 DIAGNOSIS — M5136 Other intervertebral disc degeneration, lumbar region: Secondary | ICD-10-CM | POA: Diagnosis not present

## 2017-09-28 DIAGNOSIS — Z79899 Other long term (current) drug therapy: Secondary | ICD-10-CM | POA: Diagnosis not present

## 2017-09-29 DIAGNOSIS — Z933 Colostomy status: Secondary | ICD-10-CM | POA: Diagnosis not present

## 2017-09-29 DIAGNOSIS — Z433 Encounter for attention to colostomy: Secondary | ICD-10-CM | POA: Diagnosis not present

## 2017-10-02 DIAGNOSIS — Z933 Colostomy status: Secondary | ICD-10-CM | POA: Diagnosis not present

## 2017-10-02 DIAGNOSIS — Z433 Encounter for attention to colostomy: Secondary | ICD-10-CM | POA: Diagnosis not present

## 2017-10-21 DIAGNOSIS — R000 Tachycardia, unspecified: Secondary | ICD-10-CM | POA: Diagnosis not present

## 2017-10-21 DIAGNOSIS — E7800 Pure hypercholesterolemia, unspecified: Secondary | ICD-10-CM | POA: Diagnosis not present

## 2017-10-21 DIAGNOSIS — G250 Essential tremor: Secondary | ICD-10-CM | POA: Diagnosis not present

## 2017-10-21 DIAGNOSIS — R9431 Abnormal electrocardiogram [ECG] [EKG]: Secondary | ICD-10-CM | POA: Diagnosis not present

## 2017-10-21 DIAGNOSIS — I10 Essential (primary) hypertension: Secondary | ICD-10-CM | POA: Diagnosis not present

## 2017-10-26 DIAGNOSIS — M150 Primary generalized (osteo)arthritis: Secondary | ICD-10-CM | POA: Diagnosis not present

## 2017-10-26 DIAGNOSIS — M2550 Pain in unspecified joint: Secondary | ICD-10-CM | POA: Diagnosis not present

## 2017-10-26 DIAGNOSIS — Z6826 Body mass index (BMI) 26.0-26.9, adult: Secondary | ICD-10-CM | POA: Diagnosis not present

## 2017-10-26 DIAGNOSIS — E663 Overweight: Secondary | ICD-10-CM | POA: Diagnosis not present

## 2017-10-26 DIAGNOSIS — Z79899 Other long term (current) drug therapy: Secondary | ICD-10-CM | POA: Diagnosis not present

## 2017-10-26 DIAGNOSIS — M1009 Idiopathic gout, multiple sites: Secondary | ICD-10-CM | POA: Diagnosis not present

## 2017-10-26 DIAGNOSIS — M0579 Rheumatoid arthritis with rheumatoid factor of multiple sites without organ or systems involvement: Secondary | ICD-10-CM | POA: Diagnosis not present

## 2017-10-29 DIAGNOSIS — Z79899 Other long term (current) drug therapy: Secondary | ICD-10-CM | POA: Diagnosis not present

## 2017-10-29 DIAGNOSIS — M5136 Other intervertebral disc degeneration, lumbar region: Secondary | ICD-10-CM | POA: Diagnosis not present

## 2017-10-29 DIAGNOSIS — M069 Rheumatoid arthritis, unspecified: Secondary | ICD-10-CM | POA: Diagnosis not present

## 2017-10-30 DIAGNOSIS — E785 Hyperlipidemia, unspecified: Secondary | ICD-10-CM | POA: Diagnosis not present

## 2017-10-30 DIAGNOSIS — E663 Overweight: Secondary | ICD-10-CM | POA: Diagnosis not present

## 2017-10-30 DIAGNOSIS — G629 Polyneuropathy, unspecified: Secondary | ICD-10-CM | POA: Diagnosis not present

## 2017-10-30 DIAGNOSIS — Z933 Colostomy status: Secondary | ICD-10-CM | POA: Diagnosis not present

## 2017-10-30 DIAGNOSIS — M109 Gout, unspecified: Secondary | ICD-10-CM | POA: Diagnosis not present

## 2017-10-30 DIAGNOSIS — H269 Unspecified cataract: Secondary | ICD-10-CM | POA: Diagnosis not present

## 2017-10-30 DIAGNOSIS — H409 Unspecified glaucoma: Secondary | ICD-10-CM | POA: Diagnosis not present

## 2017-10-30 DIAGNOSIS — M19042 Primary osteoarthritis, left hand: Secondary | ICD-10-CM | POA: Diagnosis not present

## 2017-10-30 DIAGNOSIS — F17200 Nicotine dependence, unspecified, uncomplicated: Secondary | ICD-10-CM | POA: Diagnosis not present

## 2017-10-30 DIAGNOSIS — Z6826 Body mass index (BMI) 26.0-26.9, adult: Secondary | ICD-10-CM | POA: Diagnosis not present

## 2017-10-30 DIAGNOSIS — G250 Essential tremor: Secondary | ICD-10-CM | POA: Diagnosis not present

## 2017-10-30 DIAGNOSIS — M479 Spondylosis, unspecified: Secondary | ICD-10-CM | POA: Diagnosis not present

## 2017-10-30 DIAGNOSIS — F330 Major depressive disorder, recurrent, mild: Secondary | ICD-10-CM | POA: Diagnosis not present

## 2017-10-30 DIAGNOSIS — M19041 Primary osteoarthritis, right hand: Secondary | ICD-10-CM | POA: Diagnosis not present

## 2017-10-30 DIAGNOSIS — G4700 Insomnia, unspecified: Secondary | ICD-10-CM | POA: Diagnosis not present

## 2017-11-11 DIAGNOSIS — R5381 Other malaise: Secondary | ICD-10-CM | POA: Diagnosis not present

## 2017-11-11 DIAGNOSIS — E785 Hyperlipidemia, unspecified: Secondary | ICD-10-CM | POA: Diagnosis not present

## 2017-11-11 DIAGNOSIS — R42 Dizziness and giddiness: Secondary | ICD-10-CM | POA: Diagnosis not present

## 2017-11-11 DIAGNOSIS — I10 Essential (primary) hypertension: Secondary | ICD-10-CM | POA: Diagnosis not present

## 2017-11-11 DIAGNOSIS — R9431 Abnormal electrocardiogram [ECG] [EKG]: Secondary | ICD-10-CM | POA: Diagnosis not present

## 2017-11-11 DIAGNOSIS — R5383 Other fatigue: Secondary | ICD-10-CM | POA: Diagnosis not present

## 2017-11-12 DIAGNOSIS — R9431 Abnormal electrocardiogram [ECG] [EKG]: Secondary | ICD-10-CM | POA: Diagnosis not present

## 2017-11-17 DIAGNOSIS — R9431 Abnormal electrocardiogram [ECG] [EKG]: Secondary | ICD-10-CM | POA: Diagnosis not present

## 2017-11-18 DIAGNOSIS — I10 Essential (primary) hypertension: Secondary | ICD-10-CM | POA: Diagnosis not present

## 2017-11-18 DIAGNOSIS — R9431 Abnormal electrocardiogram [ECG] [EKG]: Secondary | ICD-10-CM | POA: Diagnosis not present

## 2017-11-27 DIAGNOSIS — Z79899 Other long term (current) drug therapy: Secondary | ICD-10-CM | POA: Diagnosis not present

## 2017-11-27 DIAGNOSIS — M1990 Unspecified osteoarthritis, unspecified site: Secondary | ICD-10-CM | POA: Diagnosis not present

## 2017-11-27 DIAGNOSIS — M5136 Other intervertebral disc degeneration, lumbar region: Secondary | ICD-10-CM | POA: Diagnosis not present

## 2017-11-27 DIAGNOSIS — M069 Rheumatoid arthritis, unspecified: Secondary | ICD-10-CM | POA: Diagnosis not present

## 2017-12-08 DIAGNOSIS — R42 Dizziness and giddiness: Secondary | ICD-10-CM | POA: Diagnosis not present

## 2017-12-25 DIAGNOSIS — I10 Essential (primary) hypertension: Secondary | ICD-10-CM | POA: Diagnosis not present

## 2017-12-25 DIAGNOSIS — E785 Hyperlipidemia, unspecified: Secondary | ICD-10-CM | POA: Diagnosis not present

## 2017-12-25 DIAGNOSIS — R9431 Abnormal electrocardiogram [ECG] [EKG]: Secondary | ICD-10-CM | POA: Diagnosis not present

## 2017-12-25 DIAGNOSIS — R42 Dizziness and giddiness: Secondary | ICD-10-CM | POA: Diagnosis not present

## 2017-12-28 DIAGNOSIS — Z79899 Other long term (current) drug therapy: Secondary | ICD-10-CM | POA: Diagnosis not present

## 2017-12-28 DIAGNOSIS — M069 Rheumatoid arthritis, unspecified: Secondary | ICD-10-CM | POA: Diagnosis not present

## 2017-12-28 DIAGNOSIS — M5136 Other intervertebral disc degeneration, lumbar region: Secondary | ICD-10-CM | POA: Diagnosis not present

## 2018-01-12 DIAGNOSIS — Z433 Encounter for attention to colostomy: Secondary | ICD-10-CM | POA: Diagnosis not present

## 2018-01-12 DIAGNOSIS — Z933 Colostomy status: Secondary | ICD-10-CM | POA: Diagnosis not present

## 2018-01-20 DIAGNOSIS — Z79899 Other long term (current) drug therapy: Secondary | ICD-10-CM | POA: Diagnosis not present

## 2018-01-20 DIAGNOSIS — G250 Essential tremor: Secondary | ICD-10-CM | POA: Diagnosis not present

## 2018-01-20 DIAGNOSIS — I10 Essential (primary) hypertension: Secondary | ICD-10-CM | POA: Diagnosis not present

## 2018-01-20 DIAGNOSIS — E669 Obesity, unspecified: Secondary | ICD-10-CM | POA: Diagnosis not present

## 2018-01-20 DIAGNOSIS — Z6830 Body mass index (BMI) 30.0-30.9, adult: Secondary | ICD-10-CM | POA: Diagnosis not present

## 2018-01-20 DIAGNOSIS — E7800 Pure hypercholesterolemia, unspecified: Secondary | ICD-10-CM | POA: Diagnosis not present

## 2018-01-27 DIAGNOSIS — Z79899 Other long term (current) drug therapy: Secondary | ICD-10-CM | POA: Diagnosis not present

## 2018-01-27 DIAGNOSIS — M069 Rheumatoid arthritis, unspecified: Secondary | ICD-10-CM | POA: Diagnosis not present

## 2018-01-27 DIAGNOSIS — G894 Chronic pain syndrome: Secondary | ICD-10-CM | POA: Diagnosis not present

## 2018-02-25 DIAGNOSIS — M2550 Pain in unspecified joint: Secondary | ICD-10-CM | POA: Diagnosis not present

## 2018-02-25 DIAGNOSIS — M150 Primary generalized (osteo)arthritis: Secondary | ICD-10-CM | POA: Diagnosis not present

## 2018-02-25 DIAGNOSIS — M1009 Idiopathic gout, multiple sites: Secondary | ICD-10-CM | POA: Diagnosis not present

## 2018-02-25 DIAGNOSIS — E663 Overweight: Secondary | ICD-10-CM | POA: Diagnosis not present

## 2018-02-25 DIAGNOSIS — M0579 Rheumatoid arthritis with rheumatoid factor of multiple sites without organ or systems involvement: Secondary | ICD-10-CM | POA: Diagnosis not present

## 2018-02-25 DIAGNOSIS — Z79899 Other long term (current) drug therapy: Secondary | ICD-10-CM | POA: Diagnosis not present

## 2018-02-25 DIAGNOSIS — Z6828 Body mass index (BMI) 28.0-28.9, adult: Secondary | ICD-10-CM | POA: Diagnosis not present

## 2018-02-26 DIAGNOSIS — Z79899 Other long term (current) drug therapy: Secondary | ICD-10-CM | POA: Diagnosis not present

## 2018-02-26 DIAGNOSIS — G894 Chronic pain syndrome: Secondary | ICD-10-CM | POA: Diagnosis not present

## 2018-02-26 DIAGNOSIS — M069 Rheumatoid arthritis, unspecified: Secondary | ICD-10-CM | POA: Diagnosis not present

## 2018-02-26 DIAGNOSIS — M5136 Other intervertebral disc degeneration, lumbar region: Secondary | ICD-10-CM | POA: Diagnosis not present

## 2018-03-30 DIAGNOSIS — M069 Rheumatoid arthritis, unspecified: Secondary | ICD-10-CM | POA: Diagnosis not present

## 2018-03-30 DIAGNOSIS — G894 Chronic pain syndrome: Secondary | ICD-10-CM | POA: Diagnosis not present

## 2018-03-30 DIAGNOSIS — Z79899 Other long term (current) drug therapy: Secondary | ICD-10-CM | POA: Diagnosis not present

## 2018-03-30 DIAGNOSIS — F329 Major depressive disorder, single episode, unspecified: Secondary | ICD-10-CM | POA: Diagnosis not present

## 2018-04-10 DIAGNOSIS — Z433 Encounter for attention to colostomy: Secondary | ICD-10-CM | POA: Diagnosis not present

## 2018-04-10 DIAGNOSIS — Z933 Colostomy status: Secondary | ICD-10-CM | POA: Diagnosis not present

## 2018-04-29 DIAGNOSIS — G894 Chronic pain syndrome: Secondary | ICD-10-CM | POA: Diagnosis not present

## 2018-04-29 DIAGNOSIS — Z79899 Other long term (current) drug therapy: Secondary | ICD-10-CM | POA: Diagnosis not present

## 2018-04-29 DIAGNOSIS — M5136 Other intervertebral disc degeneration, lumbar region: Secondary | ICD-10-CM | POA: Diagnosis not present

## 2018-05-20 DIAGNOSIS — Z6829 Body mass index (BMI) 29.0-29.9, adult: Secondary | ICD-10-CM | POA: Diagnosis not present

## 2018-05-20 DIAGNOSIS — F1010 Alcohol abuse, uncomplicated: Secondary | ICD-10-CM | POA: Diagnosis not present

## 2018-05-20 DIAGNOSIS — I10 Essential (primary) hypertension: Secondary | ICD-10-CM | POA: Diagnosis not present

## 2018-05-20 DIAGNOSIS — Z23 Encounter for immunization: Secondary | ICD-10-CM | POA: Diagnosis not present

## 2018-05-20 DIAGNOSIS — E663 Overweight: Secondary | ICD-10-CM | POA: Diagnosis not present

## 2018-05-20 DIAGNOSIS — F329 Major depressive disorder, single episode, unspecified: Secondary | ICD-10-CM | POA: Diagnosis not present

## 2018-05-20 DIAGNOSIS — F5101 Primary insomnia: Secondary | ICD-10-CM | POA: Diagnosis not present

## 2018-05-20 DIAGNOSIS — Z0000 Encounter for general adult medical examination without abnormal findings: Secondary | ICD-10-CM | POA: Diagnosis not present

## 2018-05-20 DIAGNOSIS — F17200 Nicotine dependence, unspecified, uncomplicated: Secondary | ICD-10-CM | POA: Diagnosis not present

## 2018-05-20 DIAGNOSIS — E7800 Pure hypercholesterolemia, unspecified: Secondary | ICD-10-CM | POA: Diagnosis not present

## 2018-05-20 DIAGNOSIS — M069 Rheumatoid arthritis, unspecified: Secondary | ICD-10-CM | POA: Diagnosis not present

## 2018-05-27 DIAGNOSIS — G894 Chronic pain syndrome: Secondary | ICD-10-CM | POA: Diagnosis not present

## 2018-05-27 DIAGNOSIS — Z79899 Other long term (current) drug therapy: Secondary | ICD-10-CM | POA: Diagnosis not present

## 2018-05-27 DIAGNOSIS — M5136 Other intervertebral disc degeneration, lumbar region: Secondary | ICD-10-CM | POA: Diagnosis not present

## 2018-05-27 DIAGNOSIS — M069 Rheumatoid arthritis, unspecified: Secondary | ICD-10-CM | POA: Diagnosis not present

## 2018-06-08 DIAGNOSIS — M069 Rheumatoid arthritis, unspecified: Secondary | ICD-10-CM | POA: Diagnosis not present

## 2018-06-28 DIAGNOSIS — M1990 Unspecified osteoarthritis, unspecified site: Secondary | ICD-10-CM | POA: Diagnosis not present

## 2018-06-28 DIAGNOSIS — M069 Rheumatoid arthritis, unspecified: Secondary | ICD-10-CM | POA: Diagnosis not present

## 2018-06-28 DIAGNOSIS — Z79899 Other long term (current) drug therapy: Secondary | ICD-10-CM | POA: Diagnosis not present

## 2018-06-28 DIAGNOSIS — G894 Chronic pain syndrome: Secondary | ICD-10-CM | POA: Diagnosis not present

## 2018-07-06 DIAGNOSIS — Z433 Encounter for attention to colostomy: Secondary | ICD-10-CM | POA: Diagnosis not present

## 2018-07-06 DIAGNOSIS — Z933 Colostomy status: Secondary | ICD-10-CM | POA: Diagnosis not present

## 2018-07-22 DIAGNOSIS — I10 Essential (primary) hypertension: Secondary | ICD-10-CM | POA: Diagnosis not present

## 2018-07-22 DIAGNOSIS — E7800 Pure hypercholesterolemia, unspecified: Secondary | ICD-10-CM | POA: Diagnosis not present

## 2018-07-22 DIAGNOSIS — Z6829 Body mass index (BMI) 29.0-29.9, adult: Secondary | ICD-10-CM | POA: Diagnosis not present

## 2018-07-22 DIAGNOSIS — N410 Acute prostatitis: Secondary | ICD-10-CM | POA: Diagnosis not present

## 2018-07-22 DIAGNOSIS — Z125 Encounter for screening for malignant neoplasm of prostate: Secondary | ICD-10-CM | POA: Diagnosis not present

## 2018-07-22 DIAGNOSIS — E663 Overweight: Secondary | ICD-10-CM | POA: Diagnosis not present

## 2018-07-22 DIAGNOSIS — F329 Major depressive disorder, single episode, unspecified: Secondary | ICD-10-CM | POA: Diagnosis not present

## 2018-08-20 DIAGNOSIS — E663 Overweight: Secondary | ICD-10-CM | POA: Diagnosis not present

## 2018-08-20 DIAGNOSIS — Z6829 Body mass index (BMI) 29.0-29.9, adult: Secondary | ICD-10-CM | POA: Diagnosis not present

## 2018-08-20 DIAGNOSIS — F329 Major depressive disorder, single episode, unspecified: Secondary | ICD-10-CM | POA: Diagnosis not present

## 2018-08-30 DIAGNOSIS — M5136 Other intervertebral disc degeneration, lumbar region: Secondary | ICD-10-CM | POA: Diagnosis not present

## 2018-08-30 DIAGNOSIS — F1120 Opioid dependence, uncomplicated: Secondary | ICD-10-CM | POA: Diagnosis not present

## 2018-08-30 DIAGNOSIS — Z79899 Other long term (current) drug therapy: Secondary | ICD-10-CM | POA: Diagnosis not present

## 2018-08-30 DIAGNOSIS — G894 Chronic pain syndrome: Secondary | ICD-10-CM | POA: Diagnosis not present

## 2018-08-30 DIAGNOSIS — M069 Rheumatoid arthritis, unspecified: Secondary | ICD-10-CM | POA: Diagnosis not present

## 2018-09-01 DIAGNOSIS — F1220 Cannabis dependence, uncomplicated: Secondary | ICD-10-CM | POA: Diagnosis not present

## 2018-09-01 DIAGNOSIS — F1014 Alcohol abuse with alcohol-induced mood disorder: Secondary | ICD-10-CM | POA: Diagnosis not present

## 2018-09-27 DIAGNOSIS — R1310 Dysphagia, unspecified: Secondary | ICD-10-CM | POA: Diagnosis not present

## 2018-09-27 DIAGNOSIS — F3342 Major depressive disorder, recurrent, in full remission: Secondary | ICD-10-CM | POA: Diagnosis not present

## 2018-09-27 DIAGNOSIS — E663 Overweight: Secondary | ICD-10-CM | POA: Diagnosis not present

## 2018-09-27 DIAGNOSIS — Z6829 Body mass index (BMI) 29.0-29.9, adult: Secondary | ICD-10-CM | POA: Diagnosis not present

## 2018-09-29 DIAGNOSIS — F1220 Cannabis dependence, uncomplicated: Secondary | ICD-10-CM | POA: Diagnosis not present

## 2018-09-29 DIAGNOSIS — F1014 Alcohol abuse with alcohol-induced mood disorder: Secondary | ICD-10-CM | POA: Diagnosis not present

## 2018-09-30 DIAGNOSIS — Z433 Encounter for attention to colostomy: Secondary | ICD-10-CM | POA: Diagnosis not present

## 2018-09-30 DIAGNOSIS — Z933 Colostomy status: Secondary | ICD-10-CM | POA: Diagnosis not present

## 2018-10-07 DIAGNOSIS — R0989 Other specified symptoms and signs involving the circulatory and respiratory systems: Secondary | ICD-10-CM | POA: Diagnosis not present

## 2018-10-07 DIAGNOSIS — R1310 Dysphagia, unspecified: Secondary | ICD-10-CM | POA: Diagnosis not present

## 2018-10-13 DIAGNOSIS — Z433 Encounter for attention to colostomy: Secondary | ICD-10-CM | POA: Diagnosis not present

## 2018-10-13 DIAGNOSIS — Z933 Colostomy status: Secondary | ICD-10-CM | POA: Diagnosis not present

## 2018-10-15 DIAGNOSIS — M0579 Rheumatoid arthritis with rheumatoid factor of multiple sites without organ or systems involvement: Secondary | ICD-10-CM | POA: Diagnosis not present

## 2018-10-15 DIAGNOSIS — M150 Primary generalized (osteo)arthritis: Secondary | ICD-10-CM | POA: Diagnosis not present

## 2018-10-15 DIAGNOSIS — M2550 Pain in unspecified joint: Secondary | ICD-10-CM | POA: Diagnosis not present

## 2018-10-15 DIAGNOSIS — M1009 Idiopathic gout, multiple sites: Secondary | ICD-10-CM | POA: Diagnosis not present

## 2018-10-15 DIAGNOSIS — Z79899 Other long term (current) drug therapy: Secondary | ICD-10-CM | POA: Diagnosis not present

## 2018-10-27 DIAGNOSIS — Z79899 Other long term (current) drug therapy: Secondary | ICD-10-CM | POA: Diagnosis not present

## 2018-10-27 DIAGNOSIS — F1120 Opioid dependence, uncomplicated: Secondary | ICD-10-CM | POA: Diagnosis not present

## 2018-10-27 DIAGNOSIS — G894 Chronic pain syndrome: Secondary | ICD-10-CM | POA: Diagnosis not present

## 2018-10-27 DIAGNOSIS — M069 Rheumatoid arthritis, unspecified: Secondary | ICD-10-CM | POA: Diagnosis not present

## 2018-11-10 DIAGNOSIS — F1220 Cannabis dependence, uncomplicated: Secondary | ICD-10-CM | POA: Diagnosis not present

## 2018-11-10 DIAGNOSIS — F1014 Alcohol abuse with alcohol-induced mood disorder: Secondary | ICD-10-CM | POA: Diagnosis not present

## 2018-11-16 DIAGNOSIS — K6289 Other specified diseases of anus and rectum: Secondary | ICD-10-CM | POA: Diagnosis not present

## 2018-12-08 DIAGNOSIS — F1220 Cannabis dependence, uncomplicated: Secondary | ICD-10-CM | POA: Diagnosis not present

## 2018-12-08 DIAGNOSIS — F1014 Alcohol abuse with alcohol-induced mood disorder: Secondary | ICD-10-CM | POA: Diagnosis not present

## 2018-12-21 DIAGNOSIS — Z433 Encounter for attention to colostomy: Secondary | ICD-10-CM | POA: Diagnosis not present

## 2018-12-21 DIAGNOSIS — Z933 Colostomy status: Secondary | ICD-10-CM | POA: Diagnosis not present

## 2018-12-27 DIAGNOSIS — G894 Chronic pain syndrome: Secondary | ICD-10-CM | POA: Diagnosis not present

## 2018-12-27 DIAGNOSIS — I10 Essential (primary) hypertension: Secondary | ICD-10-CM | POA: Diagnosis not present

## 2018-12-27 DIAGNOSIS — Z1159 Encounter for screening for other viral diseases: Secondary | ICD-10-CM | POA: Diagnosis not present

## 2018-12-27 DIAGNOSIS — E559 Vitamin D deficiency, unspecified: Secondary | ICD-10-CM | POA: Diagnosis not present

## 2018-12-27 DIAGNOSIS — M129 Arthropathy, unspecified: Secondary | ICD-10-CM | POA: Diagnosis not present

## 2018-12-27 DIAGNOSIS — Z79899 Other long term (current) drug therapy: Secondary | ICD-10-CM | POA: Diagnosis not present

## 2018-12-27 DIAGNOSIS — M5136 Other intervertebral disc degeneration, lumbar region: Secondary | ICD-10-CM | POA: Diagnosis not present

## 2018-12-31 DIAGNOSIS — R198 Other specified symptoms and signs involving the digestive system and abdomen: Secondary | ICD-10-CM | POA: Diagnosis not present

## 2018-12-31 DIAGNOSIS — K6289 Other specified diseases of anus and rectum: Secondary | ICD-10-CM | POA: Diagnosis not present

## 2019-02-08 DIAGNOSIS — M0579 Rheumatoid arthritis with rheumatoid factor of multiple sites without organ or systems involvement: Secondary | ICD-10-CM | POA: Diagnosis not present

## 2019-02-08 DIAGNOSIS — E663 Overweight: Secondary | ICD-10-CM | POA: Diagnosis not present

## 2019-02-08 DIAGNOSIS — Z6826 Body mass index (BMI) 26.0-26.9, adult: Secondary | ICD-10-CM | POA: Diagnosis not present

## 2019-02-08 DIAGNOSIS — M1009 Idiopathic gout, multiple sites: Secondary | ICD-10-CM | POA: Diagnosis not present

## 2019-02-08 DIAGNOSIS — M150 Primary generalized (osteo)arthritis: Secondary | ICD-10-CM | POA: Diagnosis not present

## 2019-02-08 DIAGNOSIS — M2550 Pain in unspecified joint: Secondary | ICD-10-CM | POA: Diagnosis not present

## 2019-02-08 DIAGNOSIS — Z79899 Other long term (current) drug therapy: Secondary | ICD-10-CM | POA: Diagnosis not present

## 2019-02-14 ENCOUNTER — Encounter (HOSPITAL_COMMUNITY): Payer: Self-pay | Admitting: Emergency Medicine

## 2019-02-14 ENCOUNTER — Observation Stay (HOSPITAL_COMMUNITY)
Admission: EM | Admit: 2019-02-14 | Discharge: 2019-02-16 | Disposition: A | Discharge status: Home / Self Care | Payer: Medicare HMO | Attending: Internal Medicine | Admitting: Internal Medicine

## 2019-02-14 ENCOUNTER — Emergency Department (HOSPITAL_COMMUNITY): Payer: Medicare HMO

## 2019-02-14 ENCOUNTER — Other Ambulatory Visit: Payer: Self-pay

## 2019-02-14 DIAGNOSIS — M25511 Pain in right shoulder: Secondary | ICD-10-CM | POA: Diagnosis not present

## 2019-02-14 DIAGNOSIS — R1110 Vomiting, unspecified: Secondary | ICD-10-CM | POA: Diagnosis present

## 2019-02-14 DIAGNOSIS — M7989 Other specified soft tissue disorders: Secondary | ICD-10-CM | POA: Diagnosis not present

## 2019-02-14 DIAGNOSIS — F17200 Nicotine dependence, unspecified, uncomplicated: Secondary | ICD-10-CM | POA: Diagnosis not present

## 2019-02-14 DIAGNOSIS — M05732 Rheumatoid arthritis with rheumatoid factor of left wrist without organ or systems involvement: Secondary | ICD-10-CM | POA: Diagnosis not present

## 2019-02-14 DIAGNOSIS — D649 Anemia, unspecified: Secondary | ICD-10-CM | POA: Diagnosis not present

## 2019-02-14 DIAGNOSIS — R531 Weakness: Secondary | ICD-10-CM | POA: Insufficient documentation

## 2019-02-14 DIAGNOSIS — Z03818 Encounter for observation for suspected exposure to other biological agents ruled out: Secondary | ICD-10-CM | POA: Insufficient documentation

## 2019-02-14 DIAGNOSIS — Z20828 Contact with and (suspected) exposure to other viral communicable diseases: Secondary | ICD-10-CM | POA: Diagnosis not present

## 2019-02-14 DIAGNOSIS — R55 Syncope and collapse: Secondary | ICD-10-CM

## 2019-02-14 DIAGNOSIS — R4182 Altered mental status, unspecified: Principal | ICD-10-CM

## 2019-02-14 DIAGNOSIS — T6701XA Heatstroke and sunstroke, initial encounter: Secondary | ICD-10-CM | POA: Diagnosis not present

## 2019-02-14 DIAGNOSIS — Z79899 Other long term (current) drug therapy: Secondary | ICD-10-CM | POA: Insufficient documentation

## 2019-02-14 DIAGNOSIS — R404 Transient alteration of awareness: Secondary | ICD-10-CM | POA: Diagnosis not present

## 2019-02-14 DIAGNOSIS — M25532 Pain in left wrist: Secondary | ICD-10-CM | POA: Diagnosis not present

## 2019-02-14 DIAGNOSIS — E860 Dehydration: Secondary | ICD-10-CM

## 2019-02-14 DIAGNOSIS — R569 Unspecified convulsions: Secondary | ICD-10-CM | POA: Diagnosis not present

## 2019-02-14 DIAGNOSIS — F1020 Alcohol dependence, uncomplicated: Secondary | ICD-10-CM | POA: Diagnosis not present

## 2019-02-14 DIAGNOSIS — I6523 Occlusion and stenosis of bilateral carotid arteries: Secondary | ICD-10-CM | POA: Diagnosis not present

## 2019-02-14 DIAGNOSIS — R29818 Other symptoms and signs involving the nervous system: Secondary | ICD-10-CM | POA: Diagnosis not present

## 2019-02-14 DIAGNOSIS — D61818 Other pancytopenia: Secondary | ICD-10-CM

## 2019-02-14 DIAGNOSIS — R0602 Shortness of breath: Secondary | ICD-10-CM | POA: Diagnosis not present

## 2019-02-14 HISTORY — DX: Syncope and collapse: R55

## 2019-02-14 LAB — COMPREHENSIVE METABOLIC PANEL
ALT: 42 U/L (ref 0–44)
AST: 42 U/L — ABNORMAL HIGH (ref 15–41)
Albumin: 3.6 g/dL (ref 3.5–5.0)
Alkaline Phosphatase: 55 U/L (ref 38–126)
Anion gap: 12 (ref 5–15)
BUN: 13 mg/dL (ref 8–23)
CO2: 17 mmol/L — ABNORMAL LOW (ref 22–32)
Calcium: 9 mg/dL (ref 8.9–10.3)
Chloride: 106 mmol/L (ref 98–111)
Creatinine, Ser: 0.71 mg/dL (ref 0.61–1.24)
GFR calc Af Amer: >60 mL/min (ref >60)
GFR calc non Af Amer: >60 mL/min (ref >60)
Glucose, Bld: 101 mg/dL — ABNORMAL HIGH (ref 70–99)
Potassium: 3.5 mmol/L (ref 3.5–5.1)
Sodium: 135 mmol/L (ref 135–145)
Total Bilirubin: 0.4 mg/dL (ref 0.3–1.2)
Total Protein: 7.2 g/dL (ref 6.5–8.1)

## 2019-02-14 LAB — DIFFERENTIAL
Abs Immature Granulocytes: 0.07 10*3/uL (ref 0.00–0.07)
Basophils Absolute: 0.1 10*3/uL (ref 0.0–0.1)
Basophils Relative: 1 %
Eosinophils Absolute: 0.2 10*3/uL (ref 0.0–0.5)
Eosinophils Relative: 2 %
Immature Granulocytes: 1 %
Lymphocytes Relative: 18 %
Lymphs Abs: 2.2 10*3/uL (ref 0.7–4.0)
Monocytes Absolute: 1 10*3/uL (ref 0.1–1.0)
Monocytes Relative: 8 %
Neutro Abs: 8.2 10*3/uL — ABNORMAL HIGH (ref 1.7–7.7)
Neutrophils Relative %: 70 %

## 2019-02-14 LAB — RAPID URINE DRUG SCREEN, HOSP PERFORMED
Amphetamines: NOT DETECTED
Barbiturates: NOT DETECTED
Benzodiazepines: NOT DETECTED
Cocaine: NOT DETECTED
Opiates: POSITIVE — AB
Tetrahydrocannabinol: NOT DETECTED

## 2019-02-14 LAB — CBC
HCT: 38.6 % — ABNORMAL LOW (ref 39.0–52.0)
Hemoglobin: 12.8 g/dL — ABNORMAL LOW (ref 13.0–17.0)
MCH: 33.8 pg (ref 26.0–34.0)
MCHC: 33.2 g/dL (ref 30.0–36.0)
MCV: 101.8 fL — ABNORMAL HIGH (ref 80.0–100.0)
Platelets: 184 10*3/uL (ref 150–400)
RBC: 3.79 MIL/uL — ABNORMAL LOW (ref 4.22–5.81)
RDW: 14 % (ref 11.5–15.5)
WBC: 11.7 10*3/uL — ABNORMAL HIGH (ref 4.0–10.5)
nRBC: 0 % (ref 0.0–0.2)

## 2019-02-14 LAB — PROTIME-INR
INR: 1.1 (ref 0.8–1.2)
Prothrombin Time: 14.2 seconds (ref 11.4–15.2)

## 2019-02-14 LAB — CBG MONITORING, ED: Glucose-Capillary: 91 mg/dL (ref 70–99)

## 2019-02-14 LAB — I-STAT CHEM 8, ED
BUN: 14 mg/dL (ref 8–23)
Calcium, Ion: 1.12 mmol/L — ABNORMAL LOW (ref 1.15–1.40)
Chloride: 105 mmol/L (ref 98–111)
Creatinine, Ser: 0.7 mg/dL (ref 0.61–1.24)
Glucose, Bld: 97 mg/dL (ref 70–99)
HCT: 40 % (ref 39.0–52.0)
Hemoglobin: 13.6 g/dL (ref 13.0–17.0)
Potassium: 3.6 mmol/L (ref 3.5–5.1)
Sodium: 135 mmol/L (ref 135–145)
TCO2: 18 mmol/L — ABNORMAL LOW (ref 22–32)

## 2019-02-14 LAB — URINALYSIS, ROUTINE W REFLEX MICROSCOPIC
Bacteria, UA: NONE SEEN
Bilirubin Urine: NEGATIVE
Glucose, UA: NEGATIVE mg/dL
Ketones, ur: NEGATIVE mg/dL
Leukocytes,Ua: NEGATIVE
Nitrite: NEGATIVE
Protein, ur: NEGATIVE mg/dL
Specific Gravity, Urine: 1.023 (ref 1.005–1.030)
pH: 5 (ref 5.0–8.0)

## 2019-02-14 LAB — ETHANOL: Alcohol, Ethyl (B): <10 mg/dL (ref <10)

## 2019-02-14 LAB — APTT: aPTT: 26 seconds (ref 24–36)

## 2019-02-14 LAB — TROPONIN I (HIGH SENSITIVITY): Troponin I (High Sensitivity): 5 ng/L (ref <18)

## 2019-02-14 IMAGING — CT CT HEAD CODE STROKE W/O CM
4 series · 15 of 47 positions shown, 17 images · non-contrast
Comparison: None.

CLINICAL DATA: Code stroke.  Left-sided weakness.

EXAM:
CT HEAD WITHOUT CONTRAST
TECHNIQUE: Contiguous axial images were obtained from the base of the skull
through the vertex without intravenous contrast.

[Series 3: head wo · axial · 0.51mm/px · z∈[-124,+10]mm · 7 of 37 slices shown, 9 images]
[im 5/37  brain]
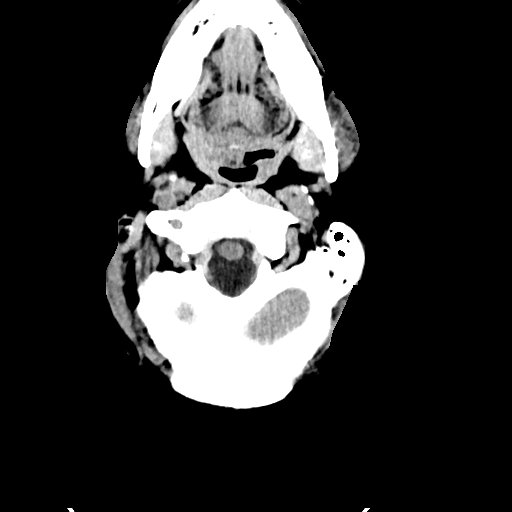
[im 5/37  bone]
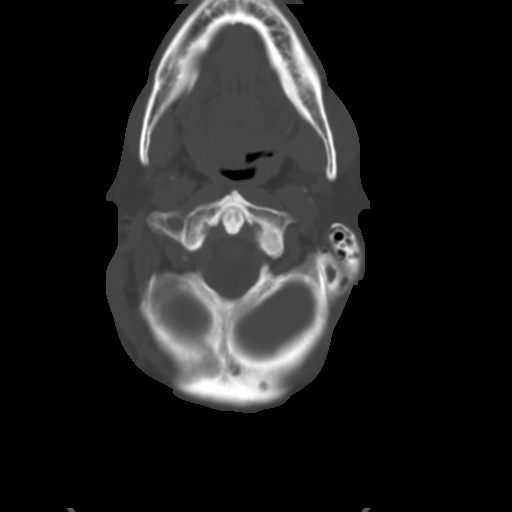
[im 10/37  brain]
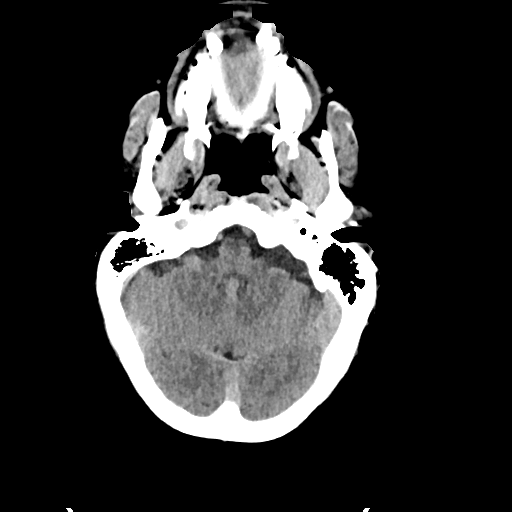
[im 14/37  brain]
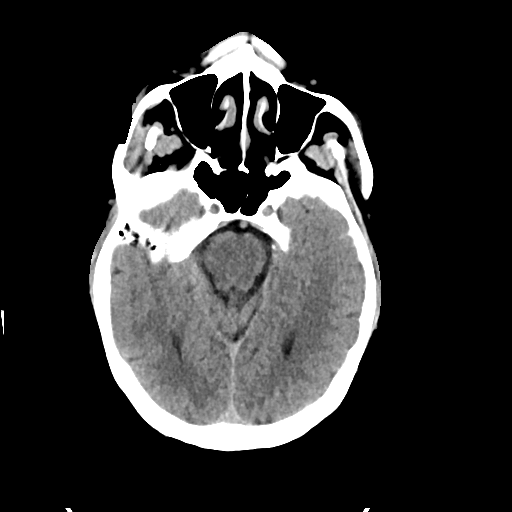
[im 19/37  brain]
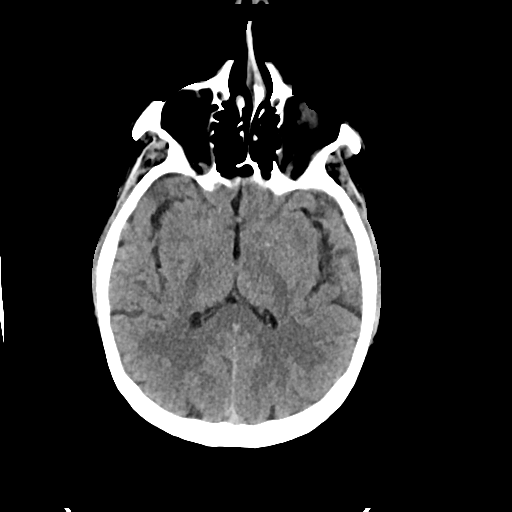
[im 23/37  brain]
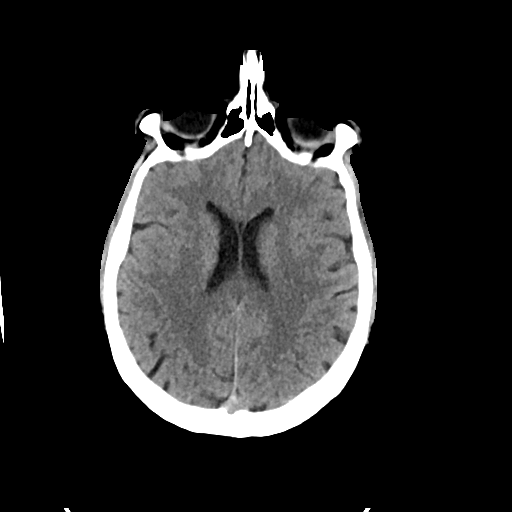
[im 23/37  bone]
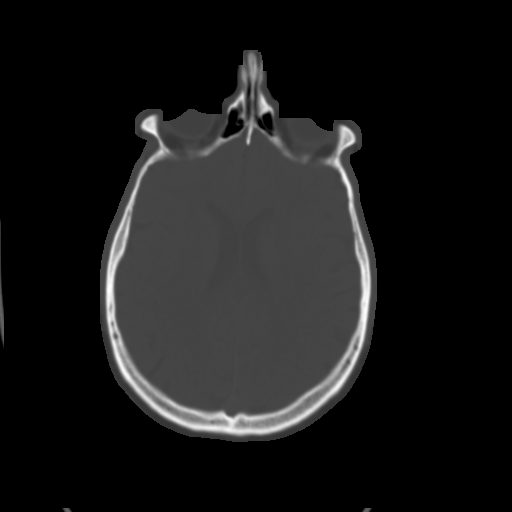
[im 28/37  brain]
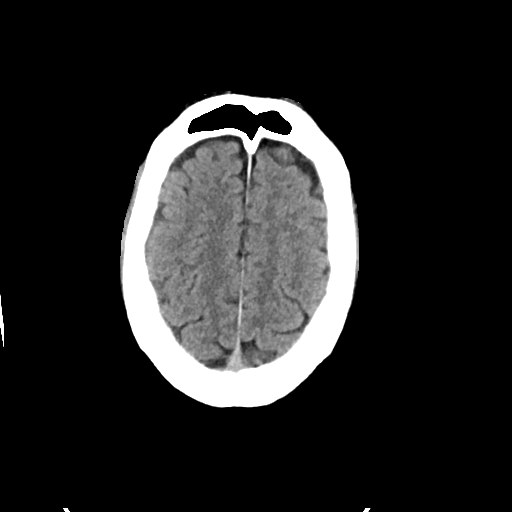
[im 32/37  brain]
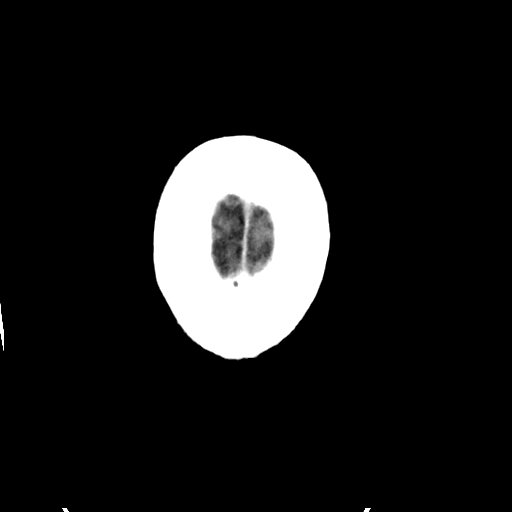

[Series 4: head bone · axial · 0.55mm/px · z∈[-126,-108]mm · 2 of 92 slices shown]
[im 10/92  bone]
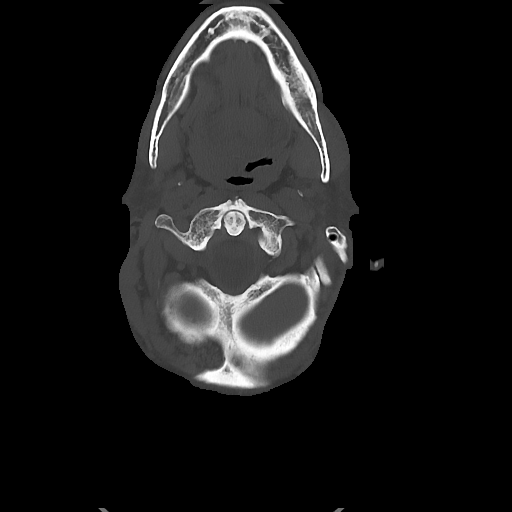
[im 19/92  bone]
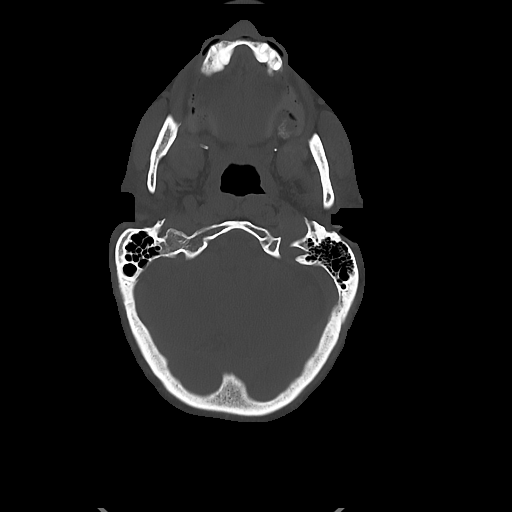

[Series 5: cor soft · coronal · 0.36mm/px · 3 of 76 slices shown]
[im 26/76  brain]
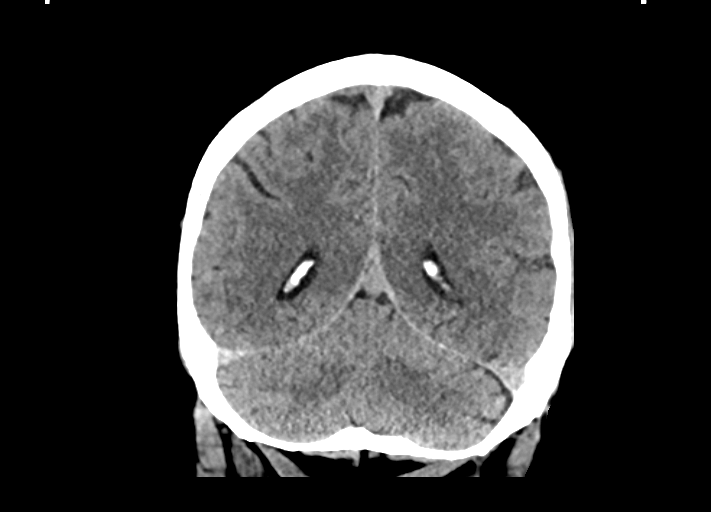
[im 34/76  brain]
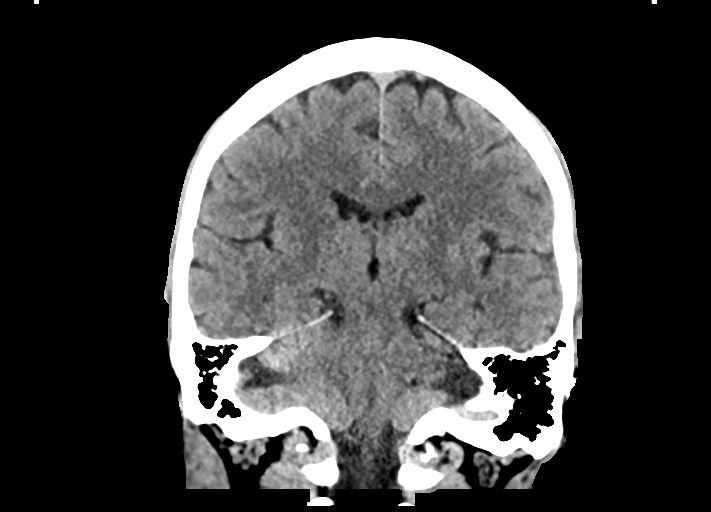
[im 42/76  brain]
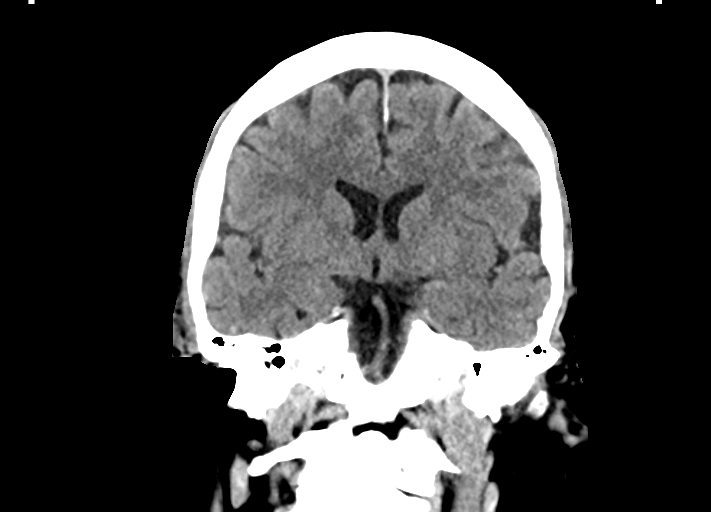

[Series 6: sag soft · sagittal · 0.36mm/px · 3 of 65 slices shown]
[im 22/65  brain]
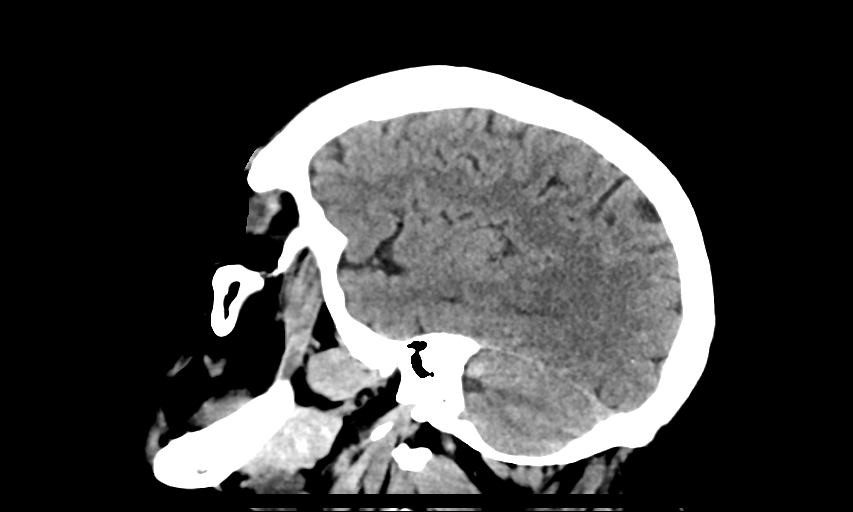
[im 33/65  brain]
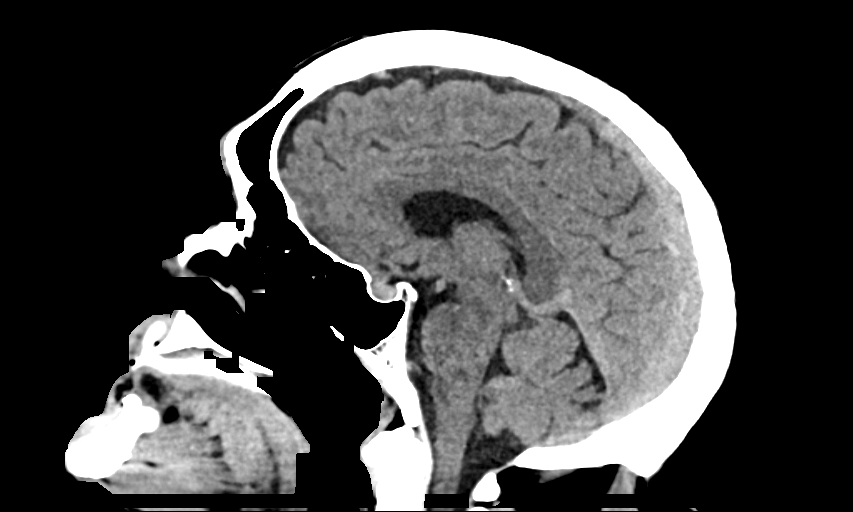
[im 43/65  brain]
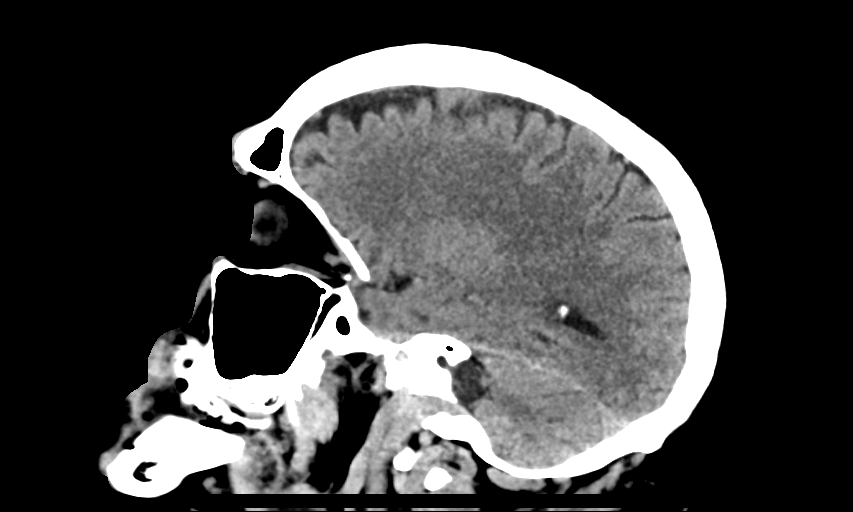

[15 of 47 positions shown; findings below may reference images not displayed]

FINDINGS: Brain: No evidence of acute infarction, hemorrhage, hydrocephalus,
extra-axial collection or mass lesion/mass effect.

Vascular: Negative for hyperdense vessel

Skull: Negative

Sinuses/Orbits: Negative

Other: None

ASPECTS (Alberta Stroke Program Early CT Score)

- Ganglionic level infarction (caudate, lentiform nuclei, internal
capsule, insula, M1-M3 cortex): 7

- Supraganglionic infarction (M4-M6 cortex): 3

Total score (0-10 with 10 being normal): 10
IMPRESSION: 1. Negative CT head
2. ASPECTS is 10
3. Text message result sent to Dr. ECEVIT.

## 2019-02-14 IMAGING — MR MRI HEAD WITHOUT CONTRAST
11 of 13 series · 37 of 48 positions shown · non-contrast
Comparison: Prior CT, CTA, and CT perfusion.

CLINICAL DATA: Initial evaluation for focal neural deficit,
possible stroke.

EXAM:
MRI HEAD WITHOUT CONTRAST
TECHNIQUE: Multiplanar, multiecho pulse sequences of the brain and surrounding
structures were obtained without intravenous contrast.

[Series 2: DWI · axial · 3.0mm · 0.94mm/px · z∈[-50,+85]mm · 7 of 96 slices shown (1 of 4)]
[im 1/96]
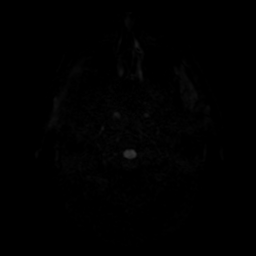
[im 16/96]
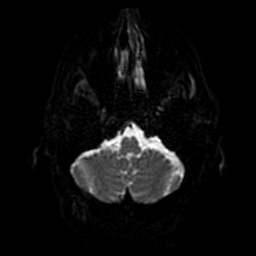
[im 32/96]
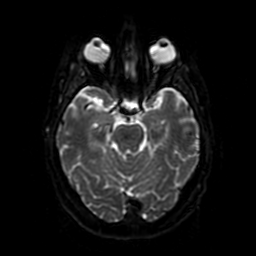
[im 48/96]
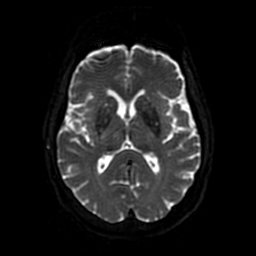
[im 64/96]
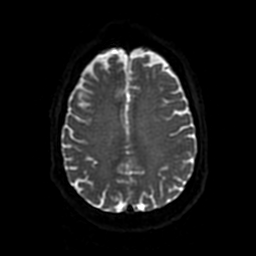
[im 80/96]
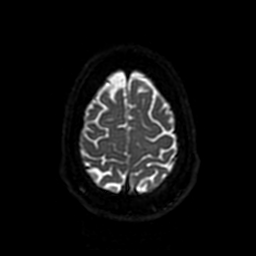
[im 96/96]
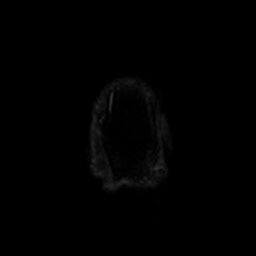

[Series 3: DWI · coronal · 4.0mm · 0.94mm/px · 4 of 72 slices shown (2 of 4)]
[im 1/72]
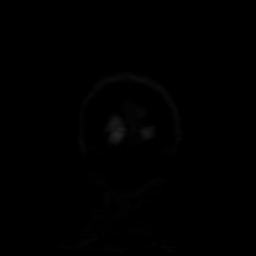
[im 24/72]
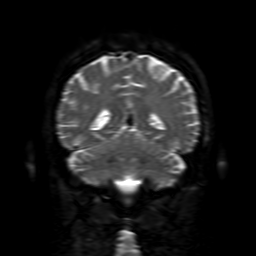
[im 48/72]
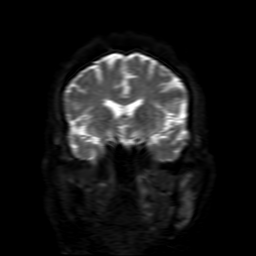
[im 72/72]
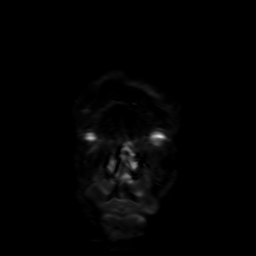

[Series 4: FLAIR · sagittal · 5.0mm · 0.47mm/px · 1 of 25 slices shown (1 of 2)]
[im 1/25]
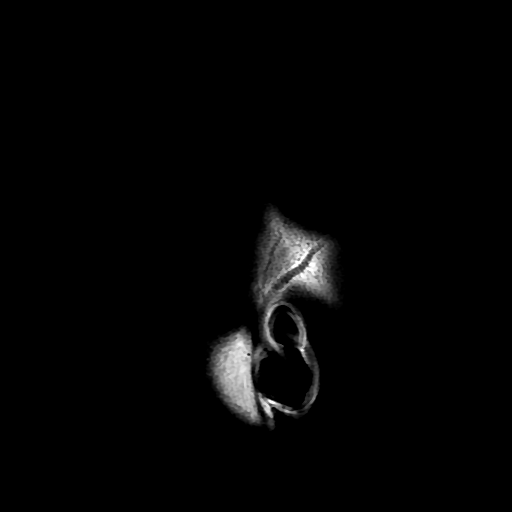

[Series 5: T2 · axial · 5.0mm · 0.47mm/px · z∈[-45,+99]mm · 2 of 27 slices shown (1 of 2)]
[im 1/27]
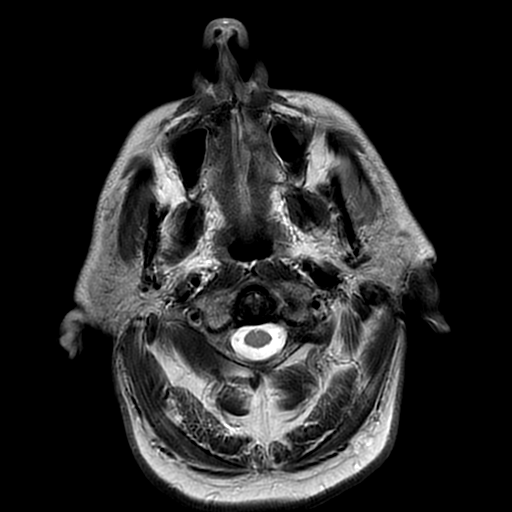
[im 27/27]
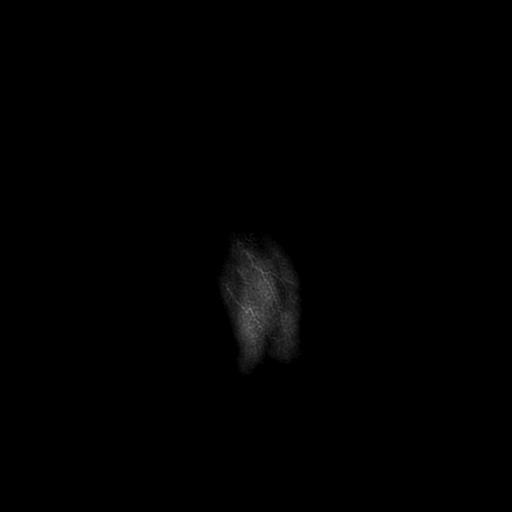

[Series 6: FLAIR · axial · 3.0mm · 0.45mm/px · z∈[-47,+98]mm · 2 of 27 slices shown (2 of 2)]
[im 1/27]
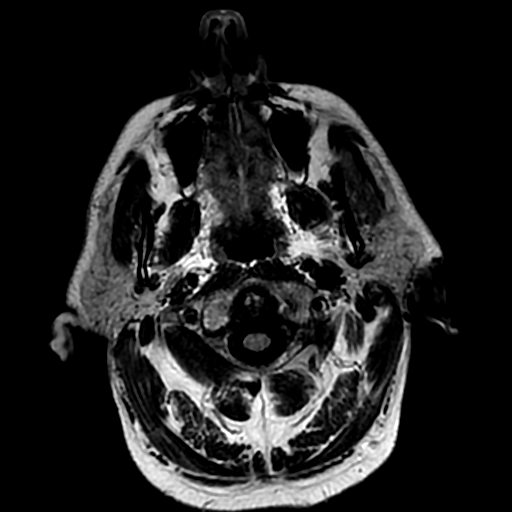
[im 27/27]
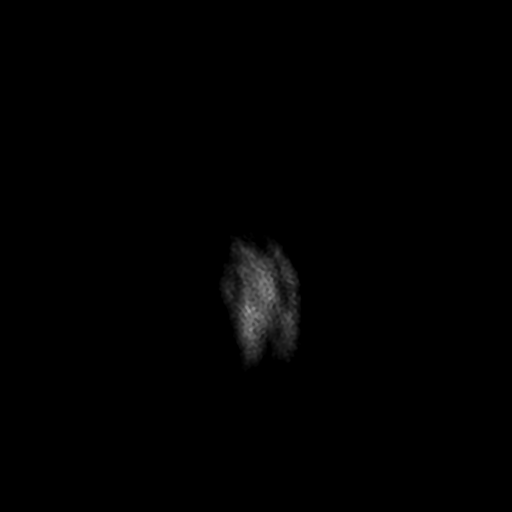

[Series 7: SWI · axial · 3.0mm · 0.47mm/px · z∈[-46,+43]mm · 4 of 108 slices shown]
[im 1/108]
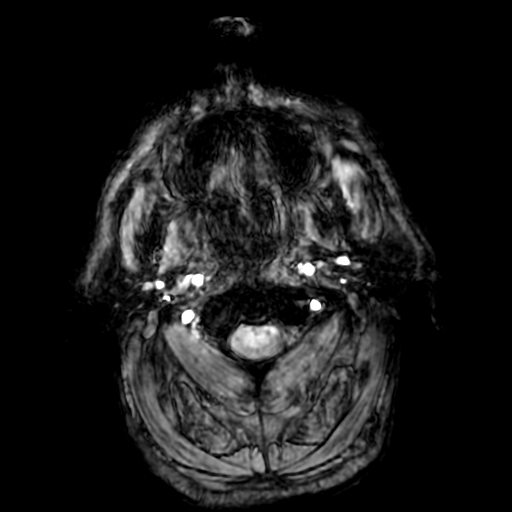
[im 22/108]
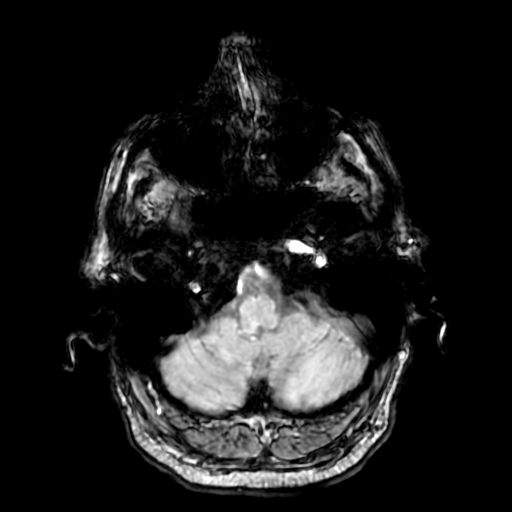
[im 43/108]
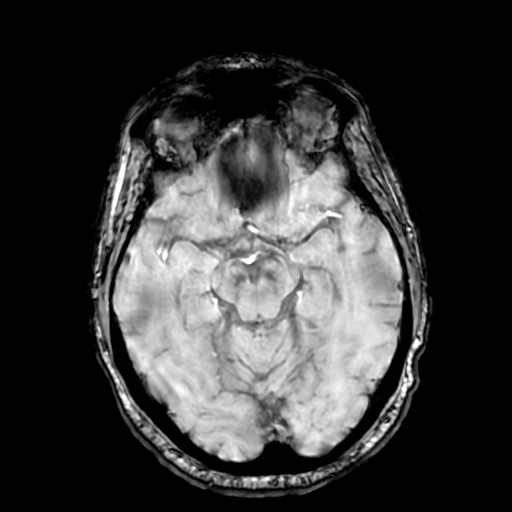
[im 65/108]
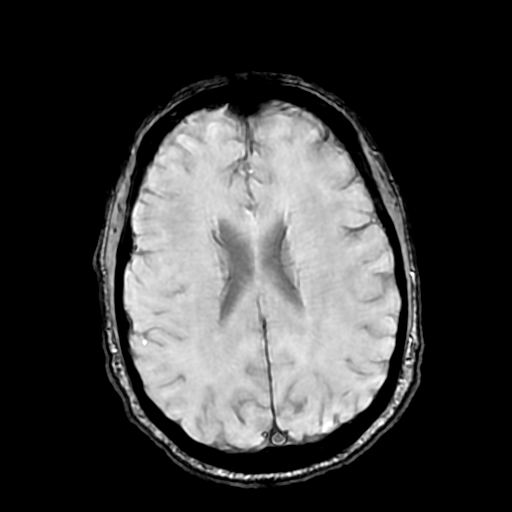

[Series 9: T2 · coronal · 5.0mm · 0.39mm/px · 2 of 32 slices shown (2 of 2)]
[im 1/32]
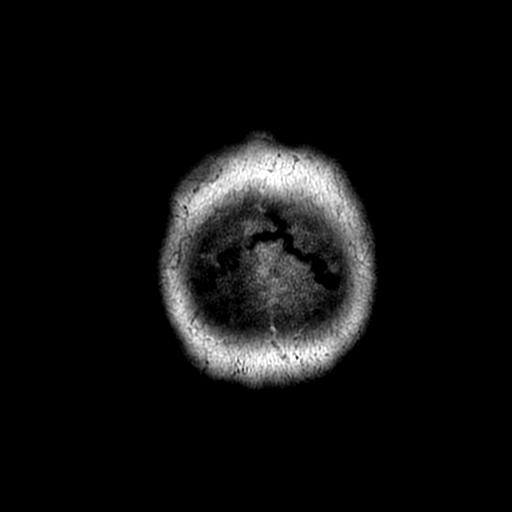
[im 32/32]
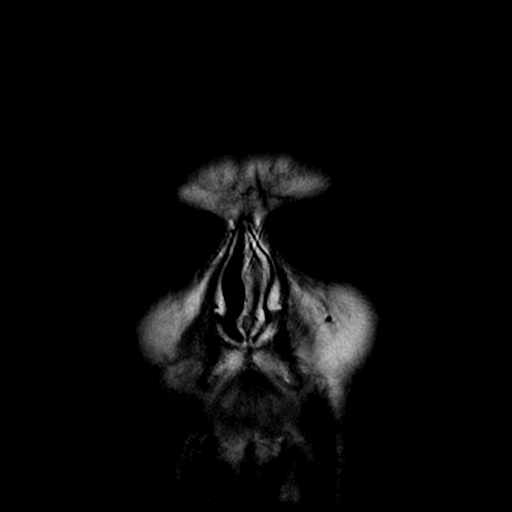

[Series 210: DWI · axial · 3.0mm · 0.94mm/px · z∈[-50,+85]mm · 6 of 96 slices shown (3 of 4)]
[im 1/96]
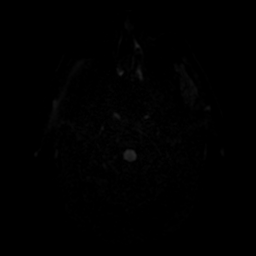
[im 20/96]
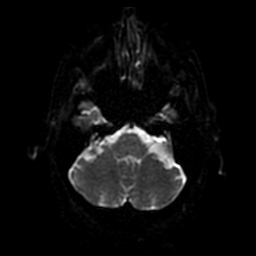
[im 39/96]
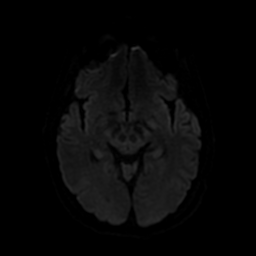
[im 58/96]
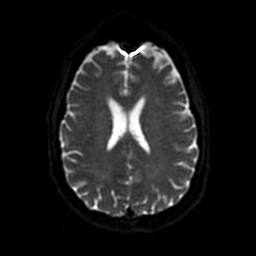
[im 77/96]
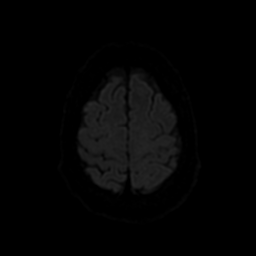
[im 96/96]
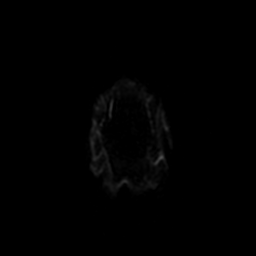

[Series 250: ADC · axial · 3.0mm · 0.94mm/px · z∈[-50,+85]mm · 3 of 48 slices shown (1 of 2)]
[im 1/48]
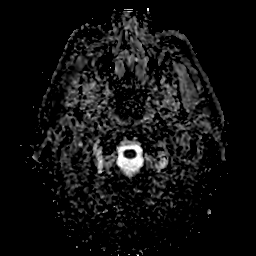
[im 24/48]
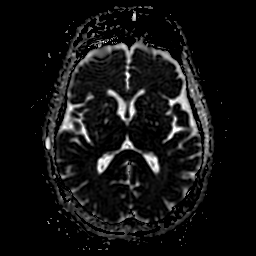
[im 48/48]
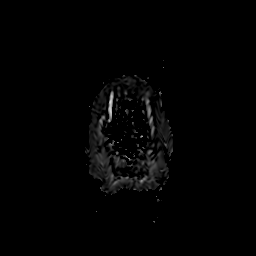

[Series 310: DWI · coronal · 4.0mm · 0.94mm/px · 4 of 72 slices shown (4 of 4)]
[im 1/72]
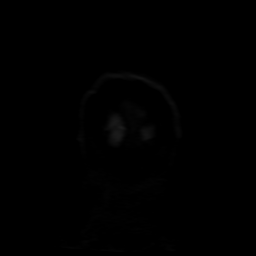
[im 24/72]
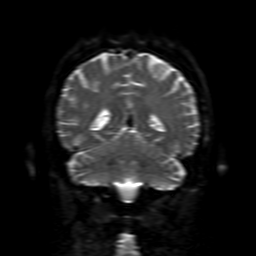
[im 48/72]
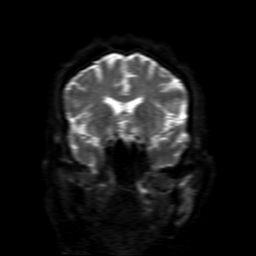
[im 72/72]
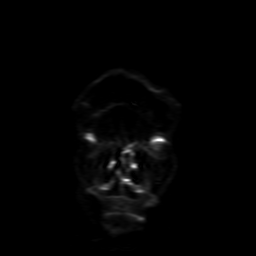

[Series 350: ADC · coronal · 4.0mm · 0.94mm/px · 2 of 36 slices shown (2 of 2)]
[im 1/36]
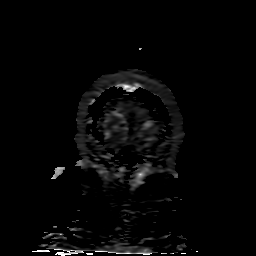
[im 36/36]
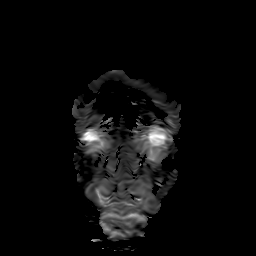

[37 of 48 positions shown; findings below may reference images not displayed]

FINDINGS: Brain: Mild age-related cerebral atrophy. Minimal patchy T2/FLAIR
hyperintensity within the periventricular white matter, most
consistent with chronic microvascular ischemic disease, felt to be
within normal limits for age.

No abnormal foci of restricted diffusion to suggest acute or
subacute ischemia. Gray-white matter differentiation well
maintained. No encephalomalacia to suggest chronic infarction. No
foci of susceptibility artifact to suggest acute or chronic
intracranial hemorrhage.

No mass lesion, midline shift or mass effect. No hydrocephalus. No
extra-axial fluid collection. Major dural sinuses are grossly
patent.

Pituitary gland and suprasellar region are normal. Midline
structures intact and normal.

Vascular: Major intracranial vascular flow voids well maintained and
normal in appearance.

Skull and upper cervical spine: Craniocervical junction normal.
Visualized upper cervical spine within normal limits. Bone marrow
signal intensity normal. No scalp soft tissue abnormality.

Sinuses/Orbits: Globes and orbital soft tissues within normal
limits.

Paranasal sinuses are clear. No mastoid effusion. Inner ear
structures normal.

Other: None.
IMPRESSION: Normal brain MRI for age. No acute intracranial infarct or other
abnormality identified.

## 2019-02-14 MED ORDER — IOHEXOL 350 MG/ML SOLN
75.0000 mL | Freq: Once | INTRAVENOUS | Status: AC | PRN
  Administered 2019-02-14: 75 mL via INTRAVENOUS

## 2019-02-14 MED ORDER — SODIUM CHLORIDE 0.9 % IV BOLUS
1000.0000 mL | Freq: Once | INTRAVENOUS | Status: AC
  Administered 2019-02-14: 1000 mL via INTRAVENOUS

## 2019-02-14 NOTE — ED Notes (Signed)
Pt in MRI.

## 2019-02-14 NOTE — ED Notes (Signed)
Cancelled Code Stroke  

## 2019-02-14 NOTE — ED Notes (Signed)
Cancelled [place] Stroke

## 2019-02-14 NOTE — ED Provider Notes (Signed)
[facility] EMERGENCY DEPARTMENT Provider Note   CSN: [id number] Arrival date & time: 02/14/19  2140    History   Chief Complaint Chief Complaint  Patient presents with   Code Stroke    HPI Eddie Taylor is a 63 y.o. male.     Pt presents to the ED today as a code stroke.  The pt was found outside slumped over.  There were possible seizure movements.  The pt was outside working all day.  EMS said pt had 2 episodes of vomiting and has had left sided weakness and left sided gaze.  This has improved upon arrival to the ED.  Code stroke team and myself met pt by the bridge.     Past Medical History:  Diagnosis Date   Arthritis    Diverticulitis    Gastric ulcer     Patient Active Problem List   Diagnosis Date Noted   H pylori ulcer 03/14/2013   Abdominal pain, unspecified site 03/13/2013   Peptic ulcer disease 03/13/2013   Cholelithiases 03/13/2013   Melena 03/13/2013    Past Surgical History:  Procedure Laterality Date   COLON SURGERY     COLOSTOMY     ESOPHAGOGASTRODUODENOSCOPY N/A 03/13/2013   Procedure: ESOPHAGOGASTRODUODENOSCOPY (EGD);  Surgeon: Lear Ng, MD;  Location: [facility] ENDOSCOPY;  Service: Endoscopy;  Laterality: N/A;        Home Medications    Prior to Admission medications   Medication Sig Start Date End Date Taking? Authorizing Provider  amitriptyline (ELAVIL) 50 MG tablet Take 50 mg by mouth at bedtime.    [provider]  bacitracin ointment Apply 1 application topically 2 (two) times daily. 01/07/17   Varney Biles, MD  buPROPion (WELLBUTRIN XL) 300 MG 24 hr tablet Take 300 mg by mouth daily. 12/05/16   [provider]  cephALEXin (KEFLEX) 500 MG capsule Take 1 capsule (500 mg total) by mouth 4 (four) times daily. 01/07/17   Varney Biles, MD  cyclobenzaprine (FLEXERIL) 10 MG tablet Take 10 mg by mouth 2 (two) times daily as needed for muscle spasms.  12/05/16   [provider]    doxycycline (VIBRAMYCIN) 100 MG capsule Take 1 capsule (100 mg total) by mouth 2 (two) times daily. Patient not taking: Reported on 01/07/2017 05/06/13   Antonietta Breach, PA-C  gabapentin (NEURONTIN) 300 MG capsule Take 900 mg by mouth 3 (three) times daily. 01/02/17   [provider]  HUMIRA 40 MG/0.8ML PSKT Inject 40 mg into the muscle every 14 (fourteen) days. 01/03/17   [provider]  HYDROcodone-acetaminophen (NORCO) 7.5-325 MG per tablet Take 1 tablet by mouth every 6 (six) hours as needed for pain.    [provider]  metoprolol succinate (TOPROL-XL) 25 MG 24 hr tablet Take 25 mg by mouth daily. 01/02/17   [provider]  pantoprazole (PROTONIX) 40 MG tablet Take 1 tablet (40 mg total) by mouth 2 (two) times daily with a meal. 03/16/13   Thurnell Lose, MD  pravastatin (PRAVACHOL) 40 MG tablet Take 40 mg by mouth daily. 12/05/16   [provider]  predniSONE (DELTASONE) 10 MG tablet Take 10 mg by mouth daily. 10/22/16   [provider]  sucralfate (CARAFATE) 1 G tablet Take 1 tablet (1 g total) by mouth 4 (four) times daily. Patient not taking: Reported on 01/07/2017 03/16/13   Thurnell Lose, MD  VASCEPA 1 g CAPS Take 2 g by mouth daily. 12/25/16   [provider]  venlafaxine XR (EFFEXOR-XR) 75 MG 24 hr capsule Take 225 mg by mouth daily. 11/05/16   [provider]  zolpidem (AMBIEN) 10 MG tablet Take 10 mg by mouth at bedtime as needed for sleep.    [provider]    Family History History reviewed. No pertinent family history.  Social History Social History   Tobacco Use   Smoking status: Current Every Day Smoker   Smokeless tobacco: Never Used  Substance Use Topics   Alcohol use: Yes   Drug use: Yes    Types: Marijuana     Allergies   Morphine and related, Prozac [fluoxetine hcl], and Sulfa antibiotics   Review of Systems Review of Systems  Neurological: Positive for weakness.      Physical Exam Updated Vital Signs BP (!) 138/93    Pulse 98    Temp 98.9 F (37.2 C) (Oral)    Resp 18    Ht '6\' 1"'  (1.854 m)    Wt 85.2 kg    SpO2 100%    BMI 24.78 kg/m   Physical Exam Vitals signs and nursing note reviewed.  Constitutional:      Appearance: Normal appearance.  HENT:     Head: Normocephalic and atraumatic.     Right Ear: External ear normal.     Left Ear: External ear normal.     Nose: Nose normal.     Mouth/Throat:     Mouth: Mucous membranes are moist.     Pharynx: Oropharynx is clear.  Eyes:     Extraocular Movements: Extraocular movements intact.     Conjunctiva/sclera: Conjunctivae normal.     Pupils: Pupils are equal, round, and reactive to light.  Neck:     Musculoskeletal: Normal range of motion and neck supple.  Cardiovascular:     Rate and Rhythm: Normal rate and regular rhythm.     Pulses: Normal pulses.     Heart sounds: Normal heart sounds.  Pulmonary:     Effort: Pulmonary effort is normal.     Breath sounds: Normal breath sounds.  Abdominal:     General: Abdomen is flat. Bowel sounds are normal.     Palpations: Abdomen is soft.     Comments: Colostomy in place  Musculoskeletal: Normal range of motion.  Skin:    General: Skin is warm.     Capillary Refill: Capillary refill takes less than 2 seconds.  Neurological:     Mental Status: He is alert.     Comments: Slow to respond, but answering questions; moving all 4 extremities      ED Treatments / Results  Labs (all labs ordered are listed, but only abnormal results are displayed) Labs Reviewed  CBC - Abnormal; Notable for the following components:      Result Value   WBC 11.7 (*)    RBC 3.79 (*)    Hemoglobin 12.8 (*)    HCT 38.6 (*)    MCV 101.8 (*)    All other components within normal limits  DIFFERENTIAL - Abnormal; Notable for the following components:   Neutro Abs 8.2 (*)    All other components within normal limits  COMPREHENSIVE METABOLIC PANEL - Abnormal;  Notable for the following components:   CO2 17 (*)    Glucose, Bld 101 (*)    AST 42 (*)    All other components within normal limits  RAPID URINE DRUG SCREEN, HOSP PERFORMED - Abnormal; Notable for the following components:   Opiates POSITIVE (*)  All other components within normal limits  URINALYSIS, ROUTINE W REFLEX MICROSCOPIC - Abnormal; Notable for the following components:   Color, Urine STRAW (*)    Hgb urine dipstick SMALL (*)    All other components within normal limits  I-STAT CHEM 8, ED - Abnormal; Notable for the following components:   Calcium, Ion 1.12 (*)    TCO2 18 (*)    All other components within normal limits  SARS CORONAVIRUS 2 (HOSPITAL ORDER, [place] [facility])  ETHANOL  PROTIME-INR  APTT  CBG MONITORING, ED  TROPONIN I (HIGH SENSITIVITY)    EKG EKG Interpretation  Date/Time:  Monday February 14 2019 22:39:15 EDT Ventricular Rate:  82 PR Interval:    QRS Duration: 120 QT Interval:  374 QTC Calculation: 437 R Axis:   10 Text Interpretation:  Sinus rhythm Nonspecific intraventricular conduction delay Minimal ST elevation, anterior leads No significant change since last tracing Confirmed by Isla Pence [phone]) on 02/14/2019 10:44:59 PM   Radiology Ct Head Code Stroke Wo Contrast  Result Date: 02/14/2019 CLINICAL DATA:  Code stroke.  Left-sided weakness. EXAM: CT HEAD WITHOUT CONTRAST TECHNIQUE: Contiguous axial images were obtained from the base of the skull through the vertex without intravenous contrast. COMPARISON:  None. FINDINGS: Brain: No evidence of acute infarction, hemorrhage, hydrocephalus, extra-axial collection or mass lesion/mass effect. Vascular: Negative for hyperdense vessel Skull: Negative Sinuses/Orbits: Negative Other: None ASPECTS ([place] Stroke Program Early CT Score) - Ganglionic level infarction (caudate, lentiform nuclei, internal capsule, insula, M1-M3 cortex): 7 - Supraganglionic infarction (M4-M6 cortex): 3  Total score (0-10 with 10 being normal): 10 IMPRESSION: 1. Negative CT head 2. ASPECTS is 10 3. Text message result sent to Dr. Rory Percy. Electronically Signed   By: Franchot Gallo M.D.   On: 02/14/2019 21:57    Procedures Procedures (including critical care time)  Medications Ordered in ED Medications  iohexol (OMNIPAQUE) 350 MG/ML injection 75 mL (75 mLs Intravenous Contrast Given 02/14/19 2207)  sodium chloride 0.9 % bolus 1,000 mL (1,000 mLs Intravenous New Bag/Given 02/14/19 2310)     Initial Impression / Assessment and Plan / ED Course  I have reviewed the triage vital signs and the nursing notes.  Pertinent labs & imaging results that were available during my care of the patient were reviewed by me and considered in my medical decision making (see chart for details).    Pt's ms is back to normal.  Code stroke cancelled.  Dr. Rory Percy requested MRI which has been ordered.  Pt signed out to Dr. Dayna Barker at shift change.    CRITICAL CARE Performed by: Isla Pence   Total critical care time: 30 minutes  Critical care time was exclusive of separately billable procedures and treating other patients.  Critical care was necessary to treat or prevent imminent or life-threatening deterioration.  Critical care was time spent personally by me on the following activities: development of treatment plan with patient and/or surrogate as well as nursing, discussions with consultants, evaluation of patient's response to treatment, examination of patient, obtaining history from patient or surrogate, ordering and performing treatments and interventions, ordering and review of laboratory studies, ordering and review of radiographic studies, pulse oximetry and re-evaluation of patient's condition.  Final Clinical Impressions(s) / ED Diagnoses   Final diagnoses:  Altered mental status, unspecified altered mental status type    ED Discharge Orders    None       Isla Pence, MD 02/14/19  2337

## 2019-02-14 NOTE — ED Triage Notes (Signed)
Pt BIB [facility]. Wife reported to EMS that pt was doing yard work most of the day LSN was 2020. Pt drank approximately 4 beers. Wife found pt slumped over, not responding with possible seizure activity. Pt had 2 episodes of vomiting. Pt has had altered mental status since, blank stares. Pt has Left side weakness and Left gaze. Pt diaphoretic, hypertensive BP 176/108. CBG-127. GCS 11

## 2019-02-14 NOTE — ED Notes (Signed)
Lab to add on troponin  

## 2019-02-14 NOTE — Consult Note (Addendum)
Neurology Consultation  Reason for Consult: Code stroke Referring Physician: Dr. Gilford Raid  CC: Questionable shaking activity, left-sided weakness, slow to respond  History is obtained from: Patient's wife, chart, patient  HPI: Eddie Taylor is a 63 y.o. male arthritis on pain medications, diverticulitis status post colostomy, usual state of health till about 8:20 PM and his wife noted that he had a sudden onset of less responsiveness and slumped over on her.  She hurt her wrist while trying to support him.  She noted some generalized tremulousness but not frank seizure activity.  She called EMS.  When EMS evaluated him, he seemed to have a gaze preference to the left and was less responsive from the left side but en route, his symptoms started to improve when he started to say his name.  Upon repeat examination in the emergency room, he had slow responses but no other focal deficits at the time. He was taken for stat CT of the head that did not reveal any bleed CTA head and neck did not reveal any LVO.  CT perfusion was negative for any perfusion deficits Exam rapidly improving. The wife later reported that he has been out in the heat probably drinking about 4 beers total in the day and it looked like he was sweating profusely and had a heat stroke.  Wife did not report any gaze preference although EMS said that he might have had some left gaze preference which resolved in route.  LKW: 2020 hrs. on 02/14/2019 tpa given?: no, rapidly improving symptoms and low NIH stroke scale Premorbid modified Rankin scale (mRS): 1 ROS:  ROS was performed and is negative except as noted in the HPI.    Past Medical History:  Diagnosis Date  . Arthritis   . Diverticulitis   . Gastric ulcer      No family history on file. No family history of stroke  Social History:   reports that he has been smoking. He has never used smokeless tobacco. He reports current alcohol use. He reports current drug use. Drug:  Marijuana. Daily alcohol use with beer, marijuana use and tobacco abuse.  Medications No current facility-administered medications for this encounter.   Current Outpatient Medications:  .  amitriptyline (ELAVIL) 50 MG tablet, Take 50 mg by mouth at bedtime., Disp: , Rfl:  .  bacitracin ointment, Apply 1 application topically 2 (two) times daily., Disp: 120 g, Rfl: 0 .  buPROPion (WELLBUTRIN XL) 300 MG 24 hr tablet, Take 300 mg by mouth daily., Disp: , Rfl:  .  cephALEXin (KEFLEX) 500 MG capsule, Take 1 capsule (500 mg total) by mouth 4 (four) times daily., Disp: 28 capsule, Rfl: 0 .  cyclobenzaprine (FLEXERIL) 10 MG tablet, Take 10 mg by mouth 2 (two) times daily as needed for muscle spasms. , Disp: , Rfl:  .  doxycycline (VIBRAMYCIN) 100 MG capsule, Take 1 capsule (100 mg total) by mouth 2 (two) times daily. (Patient not taking: Reported on 01/07/2017), Disp: 14 capsule, Rfl: 0 .  gabapentin (NEURONTIN) 300 MG capsule, Take 900 mg by mouth 3 (three) times daily., Disp: , Rfl:  .  HUMIRA 40 MG/0.8ML PSKT, Inject 40 mg into the muscle every 14 (fourteen) days., Disp: , Rfl:  .  HYDROcodone-acetaminophen (NORCO) 7.5-325 MG per tablet, Take 1 tablet by mouth every 6 (six) hours as needed for pain., Disp: , Rfl:  .  metoprolol succinate (TOPROL-XL) 25 MG 24 hr tablet, Take 25 mg by mouth daily., Disp: , Rfl:  .  pantoprazole (PROTONIX) 40 MG tablet, Take 1 tablet (40 mg total) by mouth 2 (two) times daily with a meal., Disp: 60 tablet, Rfl: 3 .  pravastatin (PRAVACHOL) 40 MG tablet, Take 40 mg by mouth daily., Disp: , Rfl:  .  predniSONE (DELTASONE) 10 MG tablet, Take 10 mg by mouth daily., Disp: , Rfl:  .  sucralfate (CARAFATE) 1 G tablet, Take 1 tablet (1 g total) by mouth 4 (four) times daily. (Patient not taking: Reported on 01/07/2017), Disp: 64 tablet, Rfl: 0 .  VASCEPA 1 g CAPS, Take 2 g by mouth daily., Disp: , Rfl:  .  venlafaxine XR (EFFEXOR-XR) 75 MG 24 hr capsule, Take 225 mg by mouth  daily., Disp: , Rfl:  .  zolpidem (AMBIEN) 10 MG tablet, Take 10 mg by mouth at bedtime as needed for sleep., Disp: , Rfl:   Exam: Current vital signs: Wt 85.2 kg   SpO2 96%   BMI 25.47 kg/m  Vital signs in last 24 hours: SpO2:  [96 %] 96 % (07/27 2158) Weight:  [85.2 kg] 85.2 kg (07/27 2100) General: Awake but slow to respond to questions in no distress HEENT: Cephalic atraumatic Neck: Clear to auscultation CVS: S1-2 regular rhythm Abdomen: Soft nondistended nontender Extremities well perfused Neurological exam Awake but slow to respond to questions, no distress. Speech is non-dysarthric. He is able to name objects but slowly. Able to follow commands Able to repeat Cranial nerves: Pupils equal round react light, extraocular movements intact, visual fields full to threat, face symmetric, tongue and palate midline. Motor exam: Extremely painful joints on touch but able to raise all 4 against gravity without any reasonable drift.  Question left arm subtle drift vertically.  No pronator drift. Sensory exam: Intact to touch all over Coordination: No dysmetria noted grossly.  NIH stroke scale-1  Labs I have reviewed labs in epic and the results pertinent to this consultation are: CBC    Component Value Date/Time   WBC 11.7 (H) 02/14/2019 2142   RBC 3.79 (L) 02/14/2019 2142   HGB 13.6 02/14/2019 2147   HCT 40.0 02/14/2019 2147   PLT 184 02/14/2019 2142   MCV 101.8 (H) 02/14/2019 2142   MCH 33.8 02/14/2019 2142   MCHC 33.2 02/14/2019 2142   RDW 14.0 02/14/2019 2142   LYMPHSABS 2.2 02/14/2019 2142   MONOABS 1.0 02/14/2019 2142   EOSABS 0.2 02/14/2019 2142   BASOSABS 0.1 02/14/2019 2142    CMP     Component Value Date/Time   NA 135 02/14/2019 2147   K 3.6 02/14/2019 2147   CL 105 02/14/2019 2147   CO2 17 (L) 02/14/2019 2142   GLUCOSE 97 02/14/2019 2147   BUN 14 02/14/2019 2147   CREATININE 0.70 02/14/2019 2147   CALCIUM 9.0 02/14/2019 2142   PROT 7.2 02/14/2019  2142   ALBUMIN 3.6 02/14/2019 2142   AST 42 (H) 02/14/2019 2142   ALT 42 02/14/2019 2142   ALKPHOS 55 02/14/2019 2142   BILITOT 0.4 02/14/2019 2142   GFRNONAA >60 02/14/2019 2142   GFRAA >60 02/14/2019 2142    Imaging I have reviewed the images obtained: CT-scan of the brain-no acute changes.  No bleed CTA head and neck with no LVO.  CT perfusion with no perfusion deficit.  Assessment:  63 year old man with above said past medical history presenting for sudden onset of slumping over-sounds like loss of consciousness without hitting his head, and some generalized body tremulousness with no bowel bladder incontinence. He was coming around  while in the ambulance and had a NIH stroke scale upon initial exam later on NIH stroke scale of 0. He has a history of chronic alcohol use.  He is also on medications that can lower seizure threshold if he were to consider this history is pertinent with seizure. Rapid return to normal and low stroke scale reasons for no TPA. Not a candidate for thrombectomy based on vascular imaging with no LVO. I suspect that this is possibly a syncopal episode versus dehydration, although labs look reassuring. Serum ethanol less than 10. Urinary toxicology screen pending  Impression: Evaluate for syncope versus seizure Evaluate for stroke-less likely  Recommendations: Obtain MRI of the brain-stroke work-up only if it is positive for stroke. Telemetry Consider echocardiogram Urinary toxicology screen Review medications-attempted minimize medications sedating side effects and possible potential lower seizure threshold such as bupropion and Ambien. Obtain a routine EEG-although I am not convinced with the history that this was a seizure, I do think an EEG should be obtained.  We will follow-up results with you.  -- Amie Portland, MD [facility] Pager: [phone] If 7pm to 7am, please call on call as listed on AMION.

## 2019-02-15 ENCOUNTER — Emergency Department (HOSPITAL_COMMUNITY): Payer: Medicare HMO

## 2019-02-15 ENCOUNTER — Observation Stay (HOSPITAL_COMMUNITY): Payer: Medicare HMO

## 2019-02-15 ENCOUNTER — Observation Stay (HOSPITAL_BASED_OUTPATIENT_CLINIC_OR_DEPARTMENT_OTHER): Payer: Medicare HMO

## 2019-02-15 ENCOUNTER — Encounter (HOSPITAL_COMMUNITY): Payer: Self-pay | Admitting: General Practice

## 2019-02-15 DIAGNOSIS — D61818 Other pancytopenia: Secondary | ICD-10-CM

## 2019-02-15 DIAGNOSIS — M05732 Rheumatoid arthritis with rheumatoid factor of left wrist without organ or systems involvement: Secondary | ICD-10-CM | POA: Diagnosis not present

## 2019-02-15 DIAGNOSIS — E860 Dehydration: Secondary | ICD-10-CM | POA: Diagnosis not present

## 2019-02-15 DIAGNOSIS — R4182 Altered mental status, unspecified: Secondary | ICD-10-CM | POA: Diagnosis not present

## 2019-02-15 DIAGNOSIS — F1020 Alcohol dependence, uncomplicated: Secondary | ICD-10-CM

## 2019-02-15 DIAGNOSIS — D649 Anemia, unspecified: Secondary | ICD-10-CM

## 2019-02-15 DIAGNOSIS — R55 Syncope and collapse: Secondary | ICD-10-CM | POA: Diagnosis not present

## 2019-02-15 DIAGNOSIS — M7989 Other specified soft tissue disorders: Secondary | ICD-10-CM | POA: Diagnosis not present

## 2019-02-15 DIAGNOSIS — M25532 Pain in left wrist: Secondary | ICD-10-CM

## 2019-02-15 DIAGNOSIS — M25511 Pain in right shoulder: Secondary | ICD-10-CM | POA: Diagnosis not present

## 2019-02-15 DIAGNOSIS — R29818 Other symptoms and signs involving the nervous system: Secondary | ICD-10-CM | POA: Diagnosis not present

## 2019-02-15 LAB — FERRITIN: Ferritin: 90 ng/mL (ref 24–336)

## 2019-02-15 LAB — BASIC METABOLIC PANEL
Anion gap: 12 (ref 5–15)
BUN: 11 mg/dL (ref 8–23)
CO2: 20 mmol/L — ABNORMAL LOW (ref 22–32)
Calcium: 9.3 mg/dL (ref 8.9–10.3)
Chloride: 102 mmol/L (ref 98–111)
Creatinine, Ser: 0.7 mg/dL (ref 0.61–1.24)
GFR calc Af Amer: >60 mL/min (ref >60)
GFR calc non Af Amer: >60 mL/min (ref >60)
Glucose, Bld: 106 mg/dL — ABNORMAL HIGH (ref 70–99)
Potassium: 3.8 mmol/L (ref 3.5–5.1)
Sodium: 134 mmol/L — ABNORMAL LOW (ref 135–145)

## 2019-02-15 LAB — VITAMIN B12: Vitamin B-12: 262 pg/mL (ref 180–914)

## 2019-02-15 LAB — HIV ANTIBODY (ROUTINE TESTING W REFLEX): HIV Screen 4th Generation wRfx: NONREACTIVE

## 2019-02-15 LAB — SARS CORONAVIRUS 2 BY RT PCR (HOSPITAL ORDER, PERFORMED IN ~~LOC~~ HOSPITAL LAB): SARS Coronavirus 2: NEGATIVE

## 2019-02-15 LAB — ECHOCARDIOGRAM COMPLETE
Height: 73 in
Weight: 3005.31 oz

## 2019-02-15 LAB — IRON AND TIBC
Iron: 42 ug/dL — ABNORMAL LOW (ref 45–182)
Saturation Ratios: 10 % — ABNORMAL LOW (ref 17.9–39.5)
TIBC: 426 ug/dL (ref 250–450)
UIBC: 384 ug/dL

## 2019-02-15 LAB — CBG MONITORING, ED: Glucose-Capillary: 105 mg/dL — ABNORMAL HIGH (ref 70–99)

## 2019-02-15 LAB — FOLATE: Folate: 12.5 ng/mL (ref >5.9)

## 2019-02-15 LAB — RETICULOCYTES
Immature Retic Fract: 10.8 % (ref 2.3–15.9)
RBC.: 4.01 MIL/uL — ABNORMAL LOW (ref 4.22–5.81)
Retic Count, Absolute: 90.6 10*3/uL (ref 19.0–186.0)
Retic Ct Pct: 2.3 % (ref 0.4–3.1)

## 2019-02-15 LAB — TROPONIN I (HIGH SENSITIVITY): Troponin I (High Sensitivity): 6 ng/L (ref <18)

## 2019-02-15 IMAGING — DX LEFT WRIST - COMPLETE 3+ VIEW
4 series · 4 of 4 positions shown · non-contrast
Comparison: None.

CLINICAL DATA: Left wrist pain, rheumatoid arthritis, possible
seizure

EXAM:
LEFT WRIST - COMPLETE 3+ VIEW

[wrist pa]
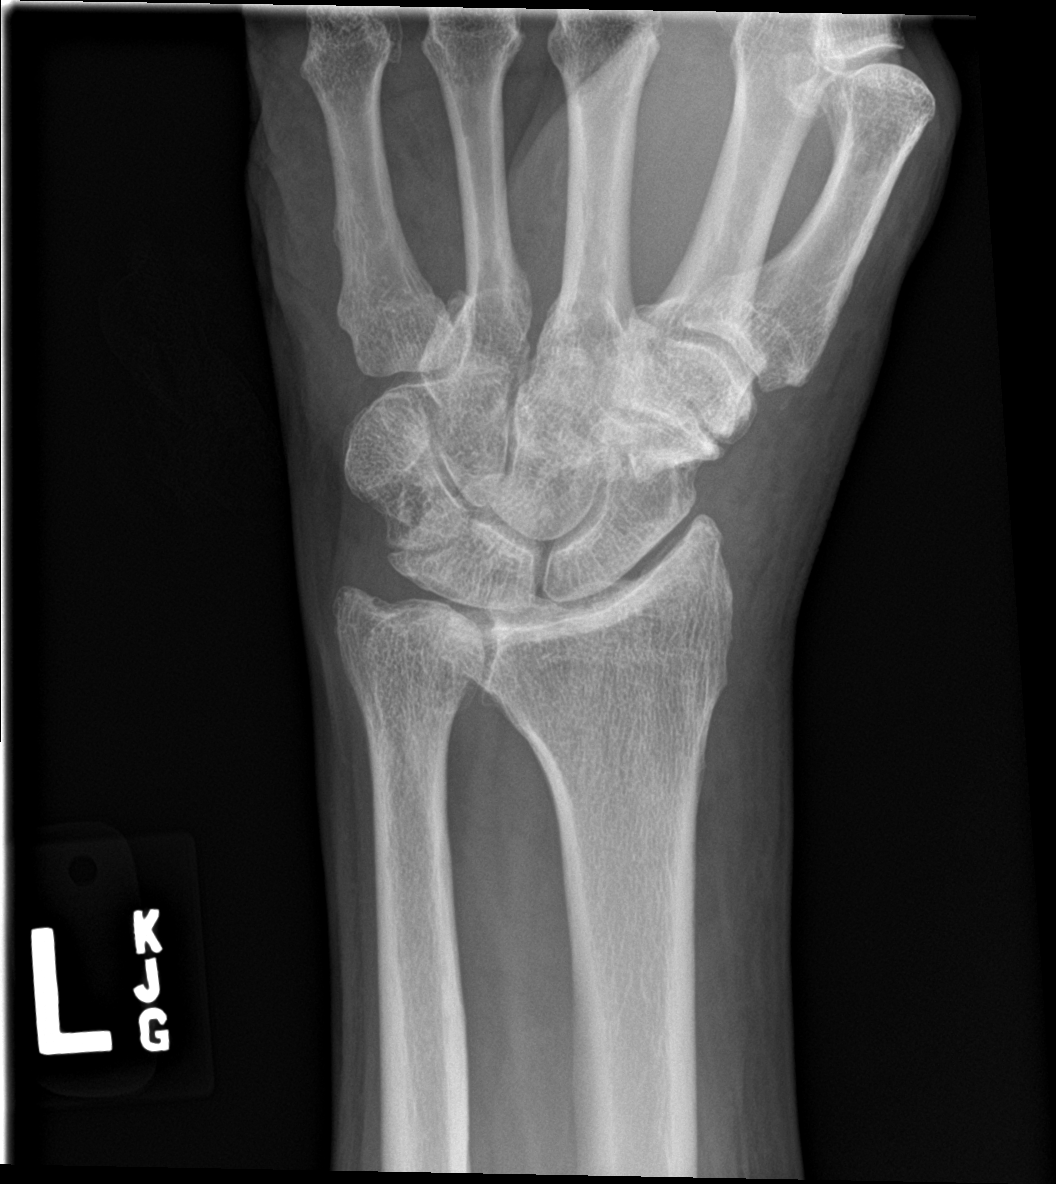

[wrist obl]
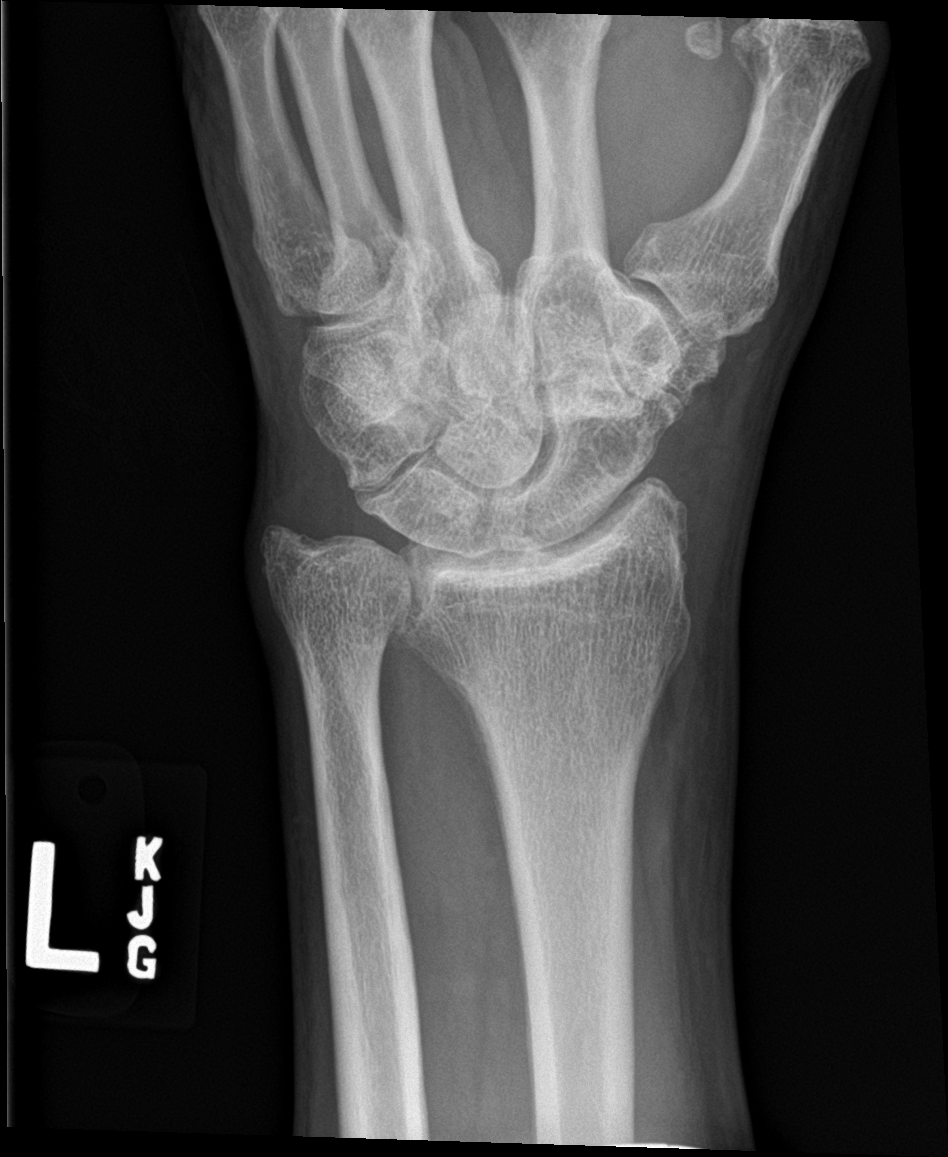

[wrist lat]
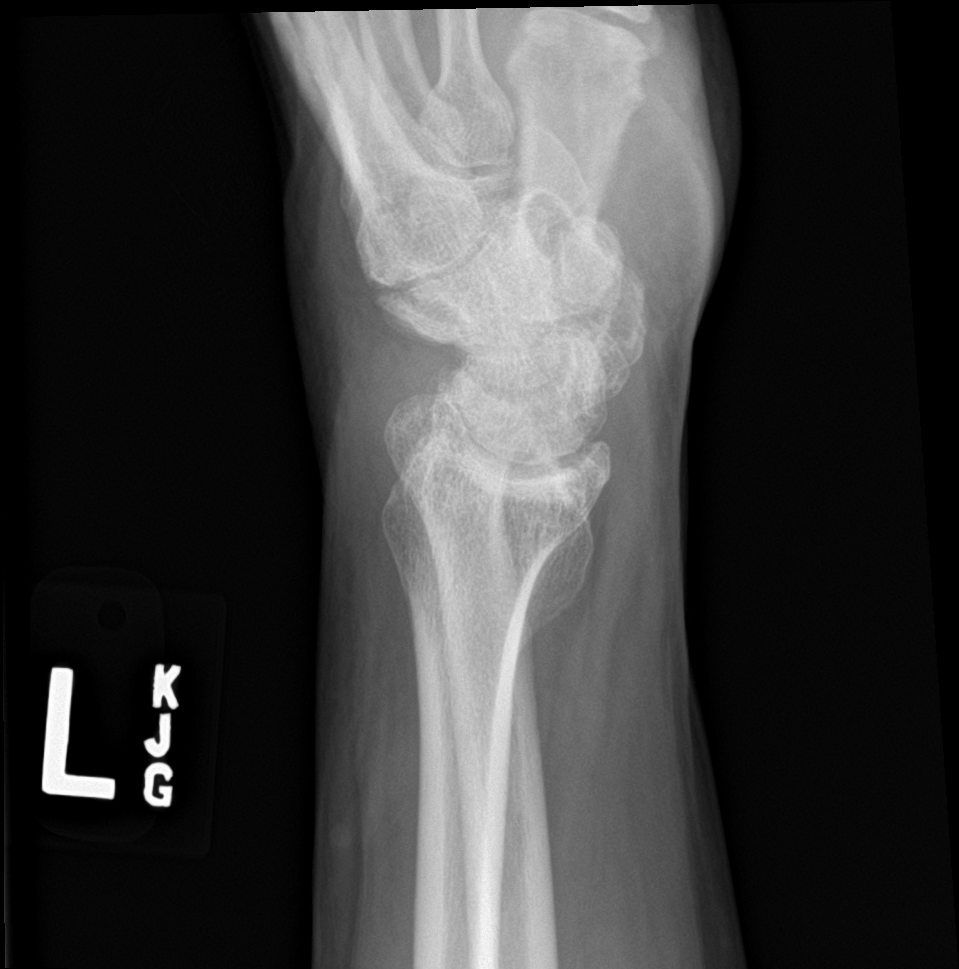

[wrist navicular]
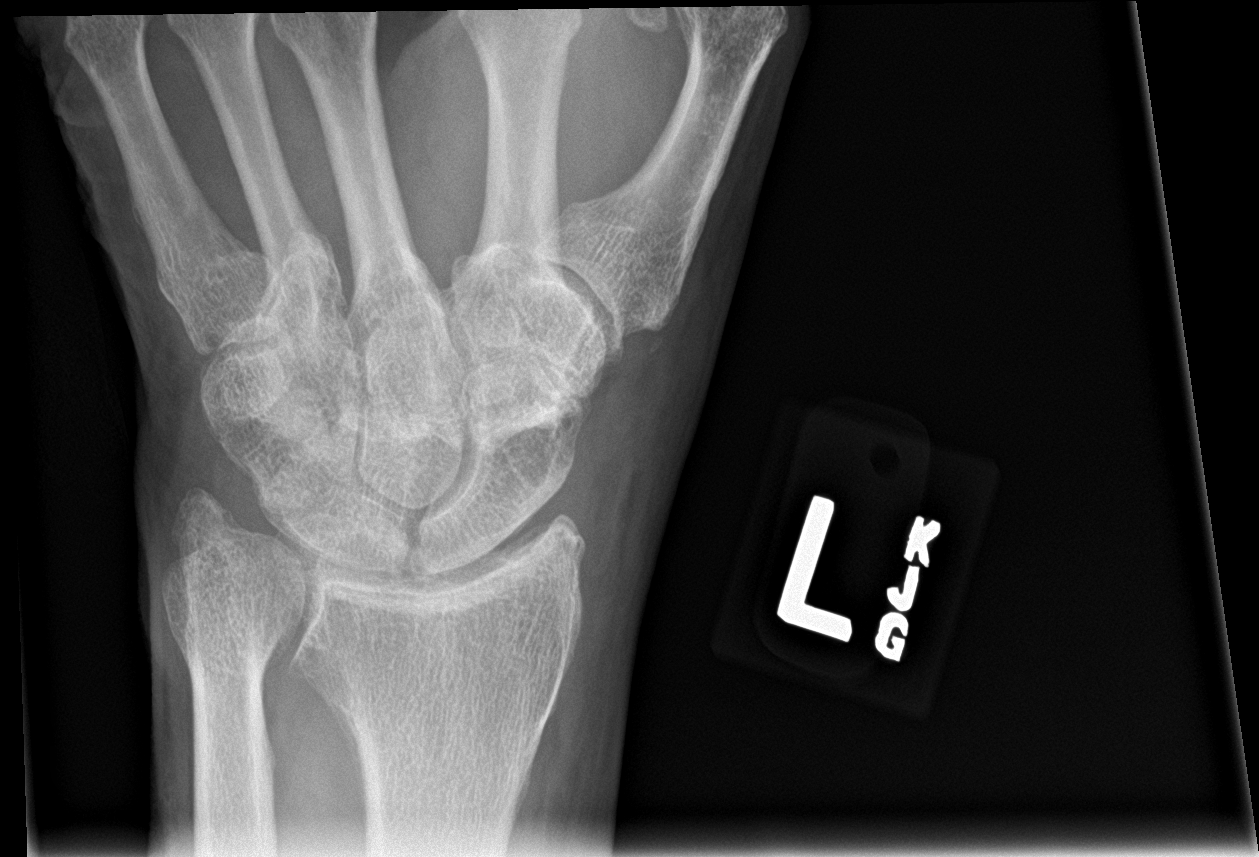

[4 of 4 positions shown; findings below may reference images not displayed]

FINDINGS: No fracture or dislocation is seen.

Mild degenerative changes of the radiocarpal articulation.

Mild dorsal soft tissue swelling.
IMPRESSION: No fracture or dislocation is seen.

Mild dorsal soft tissue swelling.

## 2019-02-15 IMAGING — DX RIGHT SHOULDER - 2+ VIEW
3 series · 3 of 3 positions shown · non-contrast
Comparison: [DATE]

CLINICAL DATA: Seizure, right shoulder pain

EXAM:
RIGHT SHOULDER - 2+ VIEW

[shoulder grashey]
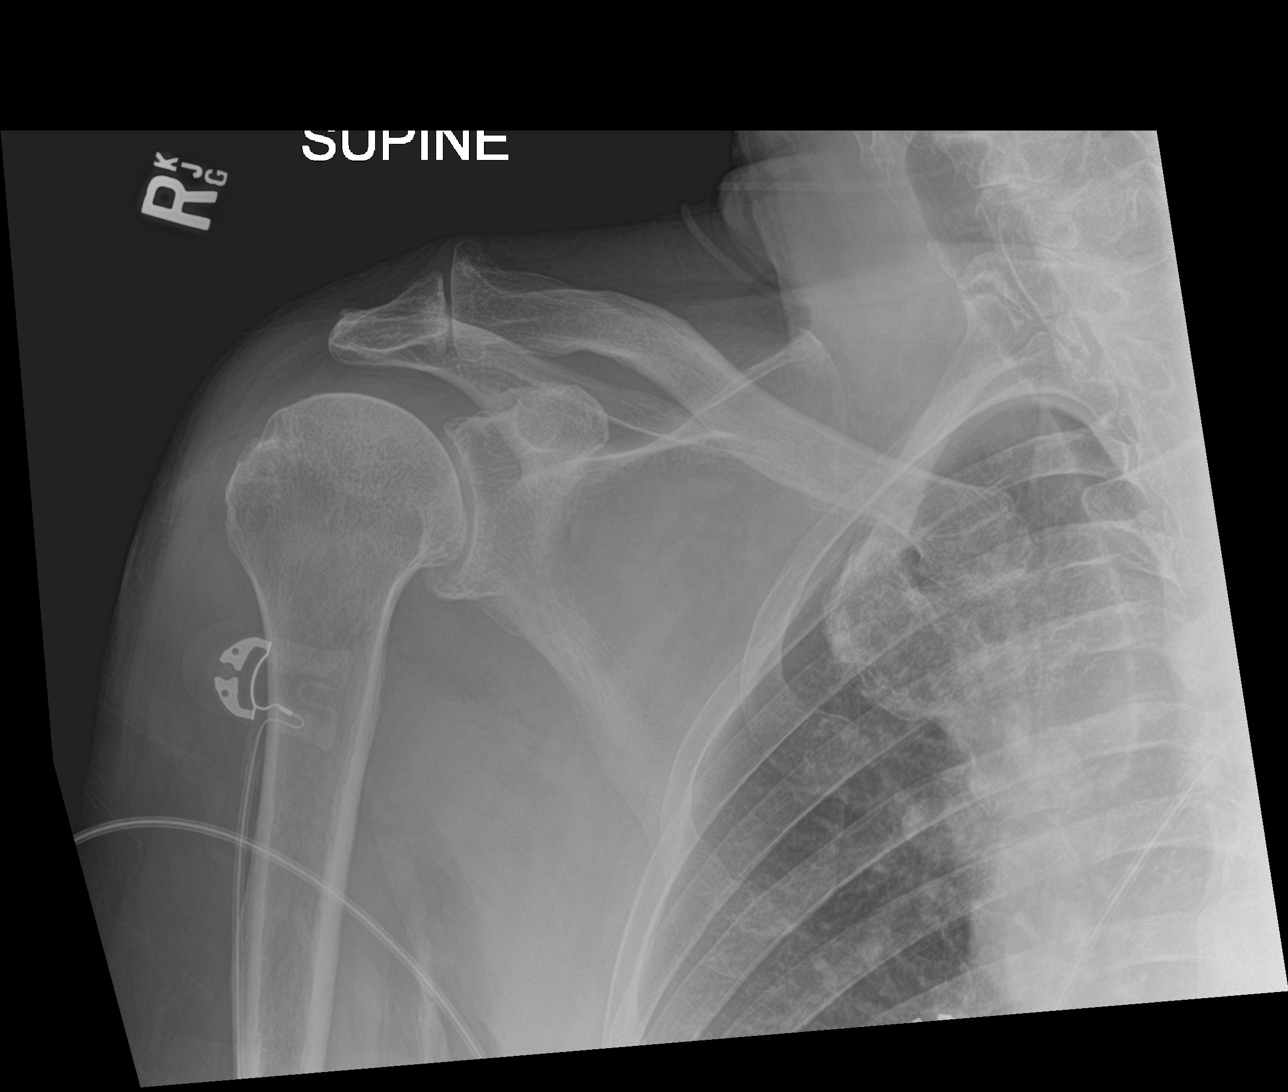

[shoulder y view (1 of 2)]
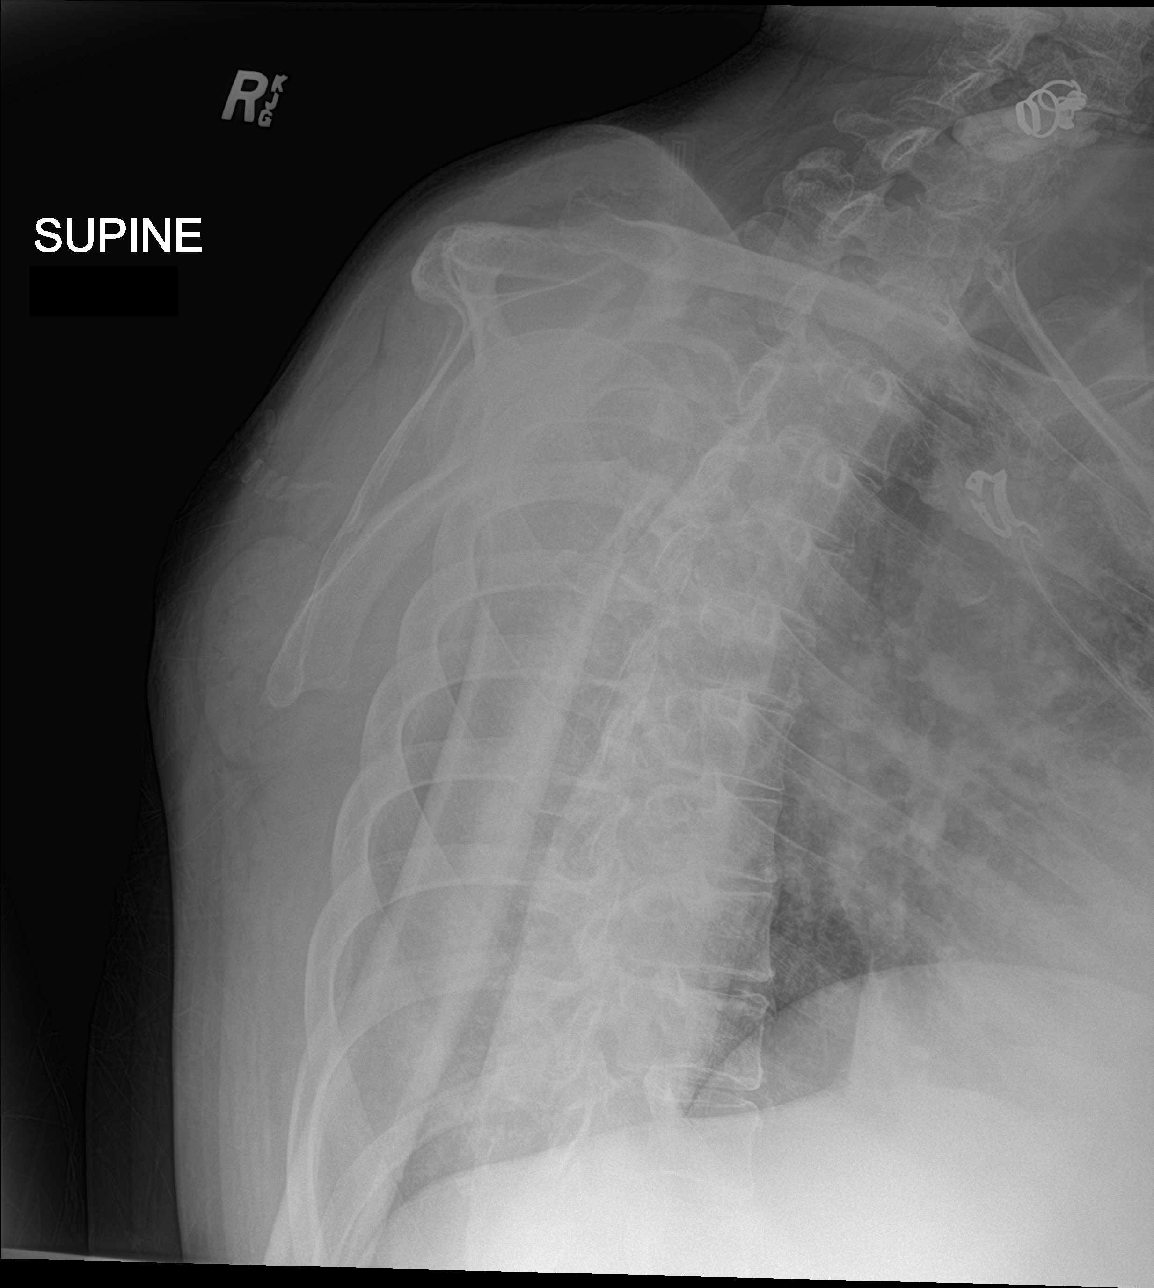

[shoulder y view (2 of 2)]
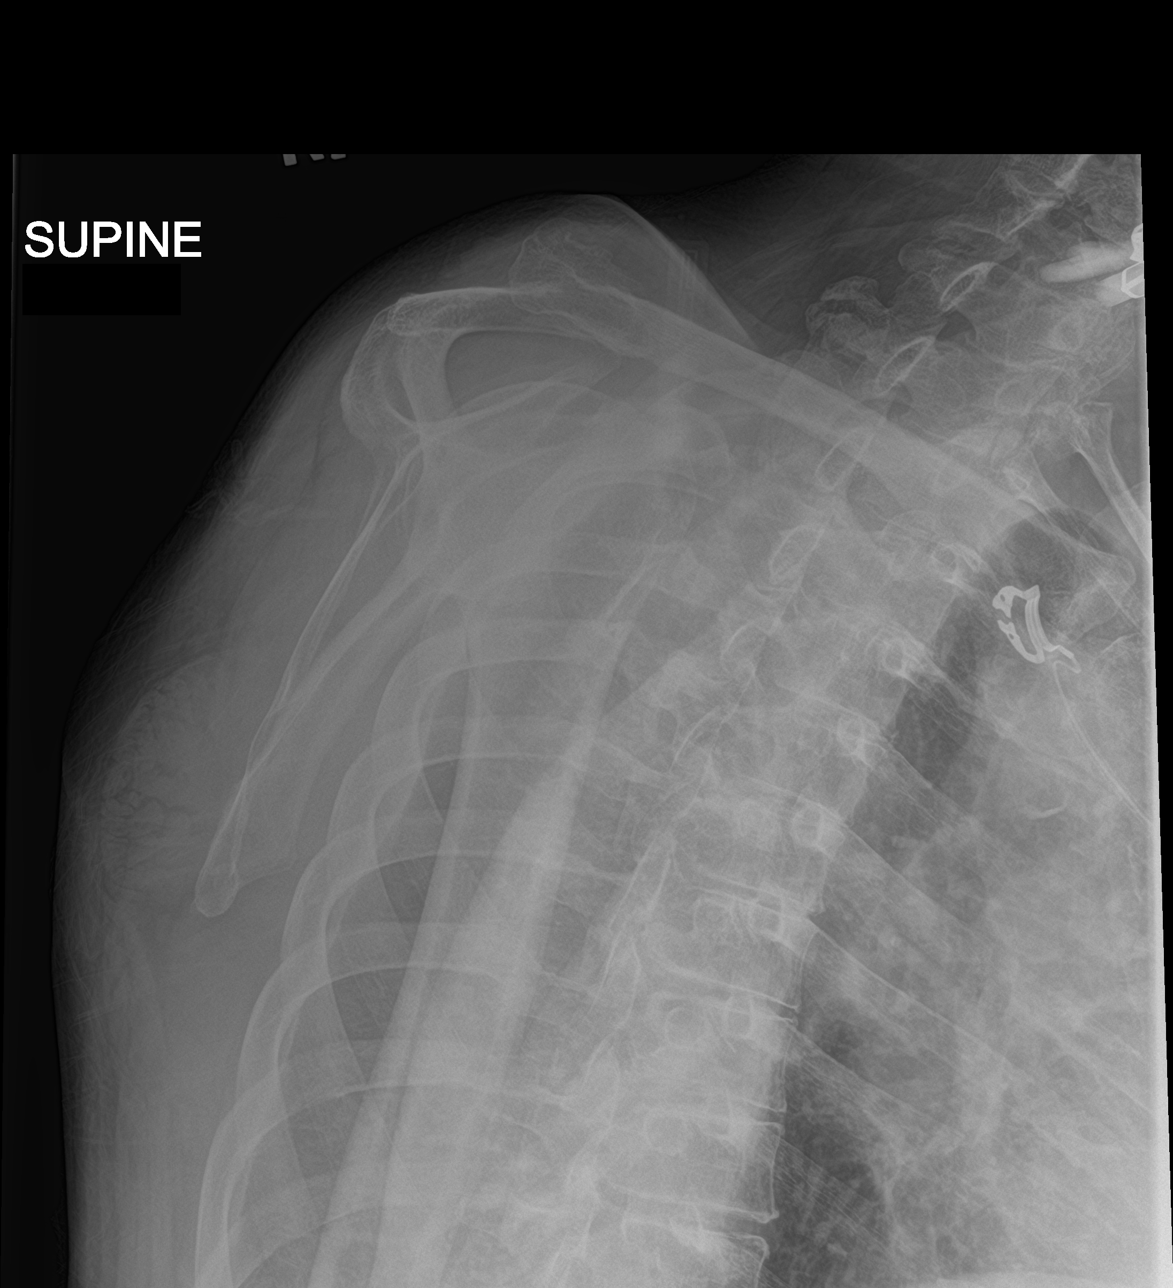

[3 of 3 positions shown; findings below may reference images not displayed]

FINDINGS: No fracture or dislocation is seen.

Mild degenerative changes of the acromioclavicular joint. No humeral
joint is essentially preserved.

Visualized soft tissues are within normal limits.

Visualized right lung is clear.
IMPRESSION: No fracture or dislocation is seen.

Mild degenerative changes of the glenohumeral joint.

## 2019-02-15 MED ORDER — PRAVASTATIN SODIUM 40 MG PO TABS
40.0000 mg | ORAL_TABLET | Freq: Every evening | ORAL | Status: DC
  Administered 2019-02-15: 40 mg via ORAL
  Filled 2019-02-15: qty 1

## 2019-02-15 MED ORDER — VITAMIN B-1 100 MG PO TABS
100.0000 mg | ORAL_TABLET | Freq: Every day | ORAL | Status: DC
  Administered 2019-02-15 – 2019-02-16 (×2): 100 mg via ORAL
  Filled 2019-02-15 (×2): qty 1

## 2019-02-15 MED ORDER — ENOXAPARIN SODIUM 40 MG/0.4ML ~~LOC~~ SOLN: 40.0000 mg | SUBCUTANEOUS | Status: DC

## 2019-02-15 MED ORDER — LORAZEPAM 2 MG/ML IJ SOLN: 1.0000 mg | Freq: Four times a day (QID) | INTRAMUSCULAR | Status: DC | PRN

## 2019-02-15 MED ORDER — METOPROLOL SUCCINATE ER 50 MG PO TB24
50.0000 mg | ORAL_TABLET | Freq: Every day | ORAL | Status: DC
  Administered 2019-02-15 – 2019-02-16 (×2): 50 mg via ORAL
  Filled 2019-02-15 (×2): qty 1

## 2019-02-15 MED ORDER — THIAMINE HCL 100 MG/ML IJ SOLN: 100.0000 mg | Freq: Every day | INTRAMUSCULAR | Status: DC

## 2019-02-15 MED ORDER — DIPHENHYDRAMINE HCL 25 MG PO CAPS: 25.0000 mg | ORAL_CAPSULE | Freq: Four times a day (QID) | ORAL | Status: DC | PRN

## 2019-02-15 MED ORDER — HYDROCODONE-ACETAMINOPHEN 7.5-325 MG PO TABS: 1.0000 | ORAL_TABLET | Freq: Four times a day (QID) | ORAL | Status: DC | PRN

## 2019-02-15 MED ORDER — ACETAMINOPHEN 325 MG PO TABS: 650.0000 mg | ORAL_TABLET | Freq: Four times a day (QID) | ORAL | Status: DC | PRN

## 2019-02-15 MED ORDER — ENOXAPARIN SODIUM 40 MG/0.4ML ~~LOC~~ SOLN
40.0000 mg | SUBCUTANEOUS | Status: DC
  Administered 2019-02-15: 40 mg via SUBCUTANEOUS
  Filled 2019-02-15: qty 0.4

## 2019-02-15 MED ORDER — SODIUM CHLORIDE 0.9 % IV SOLN
INTRAVENOUS | Status: AC
  Administered 2019-02-15: 08:00:00 via INTRAVENOUS

## 2019-02-15 MED ORDER — HYDROCODONE-ACETAMINOPHEN 5-325 MG PO TABS
1.0000 | ORAL_TABLET | Freq: Four times a day (QID) | ORAL | Status: DC | PRN
  Administered 2019-02-15 – 2019-02-16 (×4): 1 via ORAL
  Filled 2019-02-15 (×4): qty 1

## 2019-02-15 MED ORDER — LORAZEPAM 1 MG PO TABS: 1.0000 mg | ORAL_TABLET | Freq: Four times a day (QID) | ORAL | Status: DC | PRN

## 2019-02-15 MED ORDER — ADULT MULTIVITAMIN W/MINERALS CH
1.0000 | ORAL_TABLET | Freq: Every day | ORAL | Status: DC
  Administered 2019-02-15 – 2019-02-16 (×2): 1 via ORAL
  Filled 2019-02-15 (×2): qty 1

## 2019-02-15 MED ORDER — ZOLPIDEM TARTRATE 5 MG PO TABS
10.0000 mg | ORAL_TABLET | Freq: Every day | ORAL | Status: DC
  Administered 2019-02-15: 10 mg via ORAL
  Filled 2019-02-15: qty 2

## 2019-02-15 MED ORDER — FOLIC ACID 1 MG PO TABS
1.0000 mg | ORAL_TABLET | Freq: Every day | ORAL | Status: DC
  Administered 2019-02-15 – 2019-02-16 (×2): 1 mg via ORAL
  Filled 2019-02-15 (×2): qty 1

## 2019-02-15 MED ORDER — ACETAMINOPHEN 650 MG RE SUPP: 650.0000 mg | Freq: Four times a day (QID) | RECTAL | Status: DC | PRN

## 2019-02-15 MED ORDER — SODIUM CHLORIDE 0.9% FLUSH
3.0000 mL | Freq: Two times a day (BID) | INTRAVENOUS | Status: DC
  Administered 2019-02-15: 3 mL via INTRAVENOUS

## 2019-02-15 NOTE — Progress Notes (Signed)
EEG complete - results pending 

## 2019-02-15 NOTE — Progress Notes (Signed)
  Echocardiogram 2D Echocardiogram has been performed.  Bobbye Charleston 02/15/2019, 3:31 PM

## 2019-02-15 NOTE — ED Notes (Signed)
This RN called PT to request therapy to come see him soon per MD's request.

## 2019-02-15 NOTE — ED Notes (Signed)
Admitting provider bedside 

## 2019-02-15 NOTE — Evaluation (Signed)
Physical Therapy Evaluation Patient Details Name: Eddie Taylor MRN: [id number] DOB: 11/05/1955 Today's Date: 02/15/2019   History of Present Illness  Pt is a 63 y/o male admitted secondary to AMS and seizure vs. syncopal episode. Imaging of L wrist and R shoulder negative for acute abnormality. MRI negative for acute abnormality, and EEG was WNL. PMH includes arthritis.   Clinical Impression  Pt admitted secondary to problem above with deficits below. Pt guarded during gait, however, tolerated well. REquired min guard A for gait training this session. Pt with R shoulder pain and educated about R shoulder ROM exercises. Pt reports wife can assist as needed at home. Will continue to follow acutely to maximize functional mobility independence and safety.     Follow Up Recommendations Home health PT;Supervision for mobility/OOB    Equipment Recommendations  None recommended by PT    Recommendations for Other Services       Precautions / Restrictions Precautions Precautions: Fall Restrictions Weight Bearing Restrictions: No      Mobility  Bed Mobility Overal bed mobility: Needs Assistance Bed Mobility: Supine to Sit     Supine to sit: Min assist     General bed mobility comments: Min A for trunk elevation. Increased time to come to EOB secondary to pain.   Transfers Overall transfer level: Needs assistance Equipment used: None Transfers: Sit to/from Stand Sit to Stand: Min guard         General transfer comment: Min guard for safety.   Ambulation/Gait Ambulation/Gait assistance: Min guard Gait Distance (Feet): 150 Feet Assistive device: None Gait Pattern/deviations: Step-through pattern;Decreased stride length Gait velocity: Decreased   General Gait Details: Slow, very guarded gait. Decreased arm swing secondary to pain. No overt LOB noted during gait.   Stairs            Wheelchair Mobility    Modified Rankin (Stroke Patients Only)       Balance  Overall balance assessment: Needs assistance Sitting-balance support: No upper extremity supported;Feet supported Sitting balance-Leahy Scale: Good     Standing balance support: No upper extremity supported;During functional activity Standing balance-Leahy Scale: Fair                               Pertinent Vitals/Pain Pain Assessment: Faces Faces Pain Scale: Hurts even more Pain Location: R shoulder Pain Descriptors / Indicators: Grimacing;Guarding Pain Intervention(s): Limited activity within patient's tolerance;Monitored during session;Repositioned    Home Living Family/patient expects to be discharged to:: Private residence Living Arrangements: Spouse/significant other Available Help at Discharge: Family Type of Home: House Home Access: Stairs to enter Entrance Stairs-Rails: Left Entrance Stairs-Number of Steps: 3 Home Layout: One level Home Equipment: Environmental consultant - 2 wheels;Cane - single point;Bedside commode      Prior Function Level of Independence: Independent with assistive device(s)         Comments: Reports using cane when outside of house, otherwise independent.      Hand Dominance        Extremity/Trunk Assessment   Upper Extremity Assessment Upper Extremity Assessment: RUE deficits/detail;LUE deficits/detail RUE Deficits / Details: Limited R shoulder ROM secondary to increased pain.  LUE Deficits / Details: L wrist noted to be red and mildly swollen    Lower Extremity Assessment Lower Extremity Assessment: Generalized weakness    Cervical / Trunk Assessment Cervical / Trunk Assessment: Kyphotic  Communication   Communication: No difficulties  Cognition Arousal/Alertness: Awake/alert Behavior During Therapy: [facility]  for tasks assessed/performed Overall Cognitive Status: Within Functional Limits for tasks assessed                                        General Comments General comments (skin integrity, edema, etc.): pt's  wife present throughout session. Educated about how to perform ADL tasks with new R shoulder pain.     Exercises Other Exercises Other Exercises: Educated about R shoulder ROM exercises for pt to perform at home.    Assessment/Plan    PT Assessment Patient needs continued PT services  PT Problem List Decreased strength;Decreased range of motion;Decreased balance;Decreased mobility;Pain       PT Treatment Interventions DME instruction;Gait training;Therapeutic activities;Therapeutic exercise;Functional mobility training;Stair training;Balance training;Patient/family education    PT Goals (Current goals can be found in the Care Plan section)  Acute Rehab PT Goals Patient Stated Goal: to decrease pain PT Goal Formulation: With patient Time For Goal Achievement: 03/01/19 Potential to Achieve Goals: Good    Frequency Min 3X/week   Barriers to discharge        Co-evaluation               AM-PAC PT "6 Clicks" Mobility  Outcome Measure Help needed turning from your back to your side while in a flat bed without using bedrails?: A Little Help needed moving from lying on your back to sitting on the side of a flat bed without using bedrails?: A Little Help needed moving to and from a bed to a chair (including a wheelchair)?: A Little Help needed standing up from a chair using your arms (e.g., wheelchair or bedside chair)?: A Little Help needed to walk in hospital room?: A Little Help needed climbing 3-5 steps with a railing? : A Little 6 Click Score: 18    End of Session Equipment Utilized During Treatment: Gait belt Activity Tolerance: Patient tolerated treatment well Patient left: in chair;with call bell/phone within reach;with family/visitor present Nurse Communication: Mobility status PT Visit Diagnosis: Other abnormalities of gait and mobility (R26.89);Pain Pain - Right/Left: Right Pain - part of body: Shoulder(L wrist)    Time: 3704-8889 PT Time Calculation (min)  (ACUTE ONLY): 24 min   Charges:   PT Evaluation $PT Eval Low Complexity: 1 Low PT Treatments $Gait Training: 8-22 mins        Leighton Ruff, PT, DPT  Acute Rehabilitation Services  Pager: [phone] Office: [phone]   Rudean Hitt 02/15/2019, 2:31 PM

## 2019-02-15 NOTE — H&P (Signed)
History and Physical    Eddie Taylor TKP:[id number] DOB: Apr 28, 1956 DOA: 02/14/2019  PCP: Eddie Right, MD Patient coming from: Home  Chief Complaint: Altered mental status  HPI: Eddie Taylor is a 63 y.o. male with medical history significant of arthritis on pain medications, diverticulitis status post colostomy, gastric ulcer presenting to the hospital via EMS for evaluation of altered mental status.  EMS noted left-sided weakness and left gaze preference.  History provided by patient and wife at bedside.  Patient was in his usual state of health until this afternoon when he went outside to clean the house around 2 or 3 PM.  He then returned to the house to cool down as it was very hot outdoors.  He had Sprite to drink and watched TV.  Some friends came over and he had a few beers.  He then went to the swimming pool with his friends.  States a mosquito bit him at his left wrist.  Wife states patient had returned back to the house and around 6 PM she noticed that he was incoherent, his body was shaking, and he was sweating profusely.  She noticed foam coming out of his mouth.  Denies any prior history of seizures.  States she checked his blood pressure and it was 90s over 60s.  States symptoms lasted about 15 to 20 minutes.  She tried to walk him but his gait was unsteady and he slid down from her arms.  He did not hit his head.  Patient states since falling he is having excruciating pain in his Taylor shoulder.  States since the mosquito bit him at his left wrist, he is also having excruciating pain there.  Reports having chronic diffuse joint pains due to rheumatoid arthritis for which he takes Norco at home.  Patient denies any fevers, chills, chest pain, abdominal pain, or lightheadedness/dizziness.  No other complaints.  States he drinks over 6 beers every other day on a regular basis.  ED Course: Afebrile and hemodynamically stable.  White count 11.7.  Hemoglobin 12.8 and MCV 101.8, no recent  baseline.  INR 1.1.  Bicarb 17, anion gap 12.  Blood ethanol level <10.  Blood glucose 101.  UDS positive for opiates.  UA not suggestive of infection.  High-sensitivity troponin 5.  COVID-19 rapid test pending.  CT head negative for acute finding.  CT angiogram head and neck without evidence of emergent large vessel occlusion.  CT perfusion study negative for any perfusion deficits.  Atheromatous plaque about the origin of the internal carotid arteries with associated 50% stenosis bilaterally.  Additional mild for age atheromatous change about the aortic arch and carotid siphons without hemodynamically significant stenosis. Patient was seen by Dr. Rory Percy from neurology and decision was made not to give TPA given the rapid return to normal and low stroke scale.  Not a candidate for thrombectomy based on vascular imaging with no LVO.  His symptoms are thought to be secondary to possible syncopal episode versus seizure.  Stroke thought to be less likely.  Code stroke canceled.  Neurology recommended obtaining brain MRI and doing stroke work-up only if MRI is positive for stroke. Brain MRI was done and negative for acute finding.   Review of Systems:  All systems reviewed and apart from history of presenting illness, are negative.  Past Medical History:  Diagnosis Date   Arthritis    Diverticulitis    Gastric ulcer     Past Surgical History:  Procedure Laterality Date  COLON SURGERY     COLOSTOMY     ESOPHAGOGASTRODUODENOSCOPY N/A 03/13/2013   Procedure: ESOPHAGOGASTRODUODENOSCOPY (EGD);  Surgeon: Lear Ng, MD;  Location: [facility] ENDOSCOPY;  Service: Endoscopy;  Laterality: N/A;     reports that he has been smoking. He has never used smokeless tobacco. He reports current alcohol use. He reports current drug use. Drug: Marijuana.  Allergies  Allergen Reactions   Morphine And Related     "i go beside myself, i dont know. Its not pretty."    Prozac [Fluoxetine Hcl] Itching   Sulfa  Antibiotics Other (See Comments)    Doesn't feel well when taking     History reviewed. No pertinent family history.  Prior to Admission medications   Medication Sig Start Date End Date Taking? Authorizing Provider  amitriptyline (ELAVIL) 50 MG tablet Take 50 mg by mouth at bedtime.    [provider]  bacitracin ointment Apply 1 application topically 2 (two) times daily. 01/07/17   Varney Biles, MD  buPROPion (WELLBUTRIN XL) 300 MG 24 hr tablet Take 300 mg by mouth daily. 12/05/16   [provider]  cephALEXin (KEFLEX) 500 MG capsule Take 1 capsule (500 mg total) by mouth 4 (four) times daily. 01/07/17   Varney Biles, MD  cyclobenzaprine (FLEXERIL) 10 MG tablet Take 10 mg by mouth 2 (two) times daily as needed for muscle spasms.  12/05/16   [provider]  doxycycline (VIBRAMYCIN) 100 MG capsule Take 1 capsule (100 mg total) by mouth 2 (two) times daily. Patient not taking: Reported on 01/07/2017 05/06/13   Antonietta Breach, PA-C  gabapentin (NEURONTIN) 300 MG capsule Take 900 mg by mouth 3 (three) times daily. 01/02/17   [provider]  HUMIRA 40 MG/0.8ML PSKT Inject 40 mg into the muscle every 14 (fourteen) days. 01/03/17   [provider]  HYDROcodone-acetaminophen (NORCO) 7.5-325 MG per tablet Take 1 tablet by mouth every 6 (six) hours as needed for pain.    [provider]  metoprolol succinate (TOPROL-XL) 25 MG 24 hr tablet Take 25 mg by mouth daily. 01/02/17   [provider]  pantoprazole (PROTONIX) 40 MG tablet Take 1 tablet (40 mg total) by mouth 2 (two) times daily with a meal. 03/16/13   Thurnell Lose, MD  pravastatin (PRAVACHOL) 40 MG tablet Take 40 mg by mouth daily. 12/05/16   [provider]  predniSONE (DELTASONE) 10 MG tablet Take 10 mg by mouth daily. 10/22/16   [provider]  sucralfate (CARAFATE) 1 G tablet Take 1 tablet (1 g total) by mouth 4 (four) times daily. Patient not taking: Reported on  01/07/2017 03/16/13   Thurnell Lose, MD  VASCEPA 1 g CAPS Take 2 g by mouth daily. 12/25/16   [provider]  venlafaxine XR (EFFEXOR-XR) 75 MG 24 hr capsule Take 225 mg by mouth daily. 11/05/16   [provider]  zolpidem (AMBIEN) 10 MG tablet Take 10 mg by mouth at bedtime as needed for sleep.    [provider]    Physical Exam: Vitals:   02/15/19 0445 02/15/19 0456 02/15/19 0500 02/15/19 0508  BP: 118/71  124/82 124/82  Pulse: 81 84 83 76  Resp:      Temp:  98.4 F (36.9 C)    TempSrc:  Oral    SpO2: 94% 95% 96%   Weight:      Height:        Physical Exam  Constitutional: He is oriented to person, place,  and time. No distress.  HENT:  Head: Normocephalic.  Eyes: Pupils are equal, round, and reactive to light. EOM are normal. Taylor eye exhibits no discharge. Left eye exhibits no discharge.  Neck: Neck supple.  Cardiovascular: Normal rate, regular rhythm and intact distal pulses.  Pulmonary/Chest: Effort normal and breath sounds normal. No respiratory distress. He has no wheezes. He has no rales.  Abdominal: Soft. Bowel sounds are normal. He exhibits no distension. There is no abdominal tenderness. There is no guarding.  Musculoskeletal:        General: Tenderness present. No edema.     Comments: Taylor shoulder tender to palpation and very minimal active/passive range of motion secondary to pain. Left hand: Swelling noted at the first dorsal webspace.  Very limited active/passive range of motion of wrist secondary to pain.  Neurological: He is alert and oriented to person, place, and time.  Speech fluent, tongue midline, no facial droop Strength grossly intact in bilateral upper and lower extremities but examination limited secondary to pain in his Taylor shoulder and left wrist. Sensation to light touch intact throughout.  Skin: Skin is warm and dry. He is not diaphoretic.     Labs on Admission: I have personally reviewed following labs and  imaging studies  CBC: Recent Labs  Lab 02/14/19 2142 02/14/19 2147  WBC 11.7*  --   NEUTROABS 8.2*  --   HGB 12.8* 13.6  HCT 38.6* 40.0  MCV 101.8*  --   PLT 184  --    Basic Metabolic Panel: Recent Labs  Lab 02/14/19 2142 02/14/19 2147 02/15/19 0235  NA 135 135 134*  K 3.5 3.6 3.8  CL 106 105 102  CO2 17*  --  20*  GLUCOSE 101* 97 106*  BUN 13 14 11   CREATININE 0.71 0.70 0.70  CALCIUM 9.0  --  9.3   GFR: Estimated Creatinine Clearance: 108.2 mL/min (by C-G formula based on SCr of 0.7 mg/dL). Liver Function Tests: Recent Labs  Lab 02/14/19 2142  AST 42*  ALT 42  ALKPHOS 55  BILITOT 0.4  PROT 7.2  ALBUMIN 3.6   No results for input(s): LIPASE, AMYLASE in the last 168 hours. No results for input(s): AMMONIA in the last 168 hours. Coagulation Profile: Recent Labs  Lab 02/14/19 2142  INR 1.1   Cardiac Enzymes: No results for input(s): CKTOTAL, CKMB, CKMBINDEX, TROPONINI in the last 168 hours. BNP (last 3 results) No results for input(s): PROBNP in the last 8760 hours. HbA1C: No results for input(s): HGBA1C in the last 72 hours. CBG: Recent Labs  Lab 02/14/19 2143  GLUCAP 91   Lipid Profile: No results for input(s): CHOL, HDL, LDLCALC, TRIG, CHOLHDL, LDLDIRECT in the last 72 hours. Thyroid Function Tests: No results for input(s): TSH, T4TOTAL, FREET4, T3FREE, THYROIDAB in the last 72 hours. Anemia Panel: Recent Labs    02/15/19 0235  VITAMINB12 262  FOLATE 12.5  FERRITIN 90  TIBC 426  IRON 42*  RETICCTPCT 2.3   Urine analysis:    Component Value Date/Time   COLORURINE STRAW (A) 02/14/2019 2246   APPEARANCEUR CLEAR 02/14/2019 2246   LABSPEC 1.023 02/14/2019 2246   PHURINE 5.0 02/14/2019 2246   GLUCOSEU NEGATIVE 02/14/2019 2246   HGBUR SMALL (A) 02/14/2019 2246   BILIRUBINUR NEGATIVE 02/14/2019 2246   KETONESUR NEGATIVE 02/14/2019 2246   PROTEINUR NEGATIVE 02/14/2019 2246   UROBILINOGEN 0.2 03/13/2013 0157   NITRITE NEGATIVE  02/14/2019 2246   LEUKOCYTESUR NEGATIVE 02/14/2019 2246    Radiological Exams  on Admission: Ct Angio Head W Or Wo Contrast  Result Date: 02/14/2019 CLINICAL DATA:  Initial evaluation for acute left-sided weakness. EXAM: CT ANGIOGRAPHY HEAD AND NECK CT PERFUSION BRAIN TECHNIQUE: Multidetector CT imaging of the head and neck was performed using the standard protocol during bolus administration of intravenous contrast. Multiplanar CT image reconstructions and MIPs were obtained to evaluate the vascular anatomy. Carotid stenosis measurements (when applicable) are obtained utilizing NASCET criteria, using the distal internal carotid diameter as the denominator. Multiphase CT imaging of the brain was performed following IV bolus contrast injection. Subsequent parametric perfusion maps were calculated using RAPID software. CONTRAST:  13mL OMNIPAQUE IOHEXOL 350 MG/ML SOLN COMPARISON:  Prior noncontrast head CT from earlier the same day. FINDINGS: CTA NECK FINDINGS Aortic arch: Visualized aortic arch of normal caliber with normal branch pattern. Mild atheromatous plaque within the aortic arch and about the origin of the great vessels without hemodynamically significant stenosis. Visualized subclavian arteries widely patent. Taylor carotid system: Taylor common carotid artery patent from its origin to the bifurcation without stenosis. Scattered mixed plaque about the proximal Taylor ICA with associated stenosis of up to approximately 50% by NASCET criteria. Taylor ICA patent distally to the skull base without stenosis, dissection, or occlusion. Left carotid system: Left common carotid artery patent from its origin to the bifurcation without hemodynamically significant stenosis. Scattered mixed plaque about the proximal left ICA with associated stenosis of up to approximately 50% by NASCET criteria. Left ICA patent distally to the skull base without stenosis, dissection, or occlusion. Vertebral arteries: Both vertebral  arteries arise from the subclavian arteries. Vertebral arteries patent within the neck without stenosis, dissection, or occlusion. Skeleton: No acute osseous finding. No discrete lytic or blastic osseous lesions. C6 and C7 vertebral bodies are partially ankylosed with prominent anterior bridging osteophytic spurring. Other neck: No other acute soft tissue abnormality within the neck. Upper chest: Visualized upper chest demonstrates no acute finding. Review of the MIP images confirms the above findings CTA HEAD FINDINGS Anterior circulation: Petrous segments patent bilaterally. Mild scattered plaque within the cavernous/supraclinoid ICAs without hemodynamically significant stenosis. ICA termini well perfused. A1 segments patent bilaterally. Normal anterior communicating artery. Anterior cerebral arteries patent to their distal aspects without stenosis. Dominant left ACA noted. M1 segments widely patent bilaterally. Normal MCA bifurcations. Distal MCA branches well perfused and symmetric. Posterior circulation: Focal non stenotic plaque noted within the left V4 segment as it crosses the dura into the cranial vault. The 4 segments otherwise unremarkable and patent to the vertebrobasilar junction without stenosis. Patent Taylor PICA. Left PICA not seen. Basilar widely patent to its distal aspect. Superior cerebral arteries patent bilaterally. Both of the posterior cerebral arteries primarily supplied via the basilar and are well perfused to their distal aspects. Review of the MIP images confirms the above findings CT Brain Perfusion Findings: ASPECTS: 10 CBF (<30%) Volume: 21mL Perfusion (Tmax>6.0s) volume: 73mL Mismatch Volume: 49mL Infarction Location:Negative CT perfusion with no evidence for acute infarct. IMPRESSION: 1. Negative CTA with no evidence for emergent large vessel occlusion. 2. Negative CT perfusion with no evidence for acute core infarct or other perfusion abnormality. 3. Atheromatous plaque about the  origin of the internal carotid arteries with associated 50% stenosis by NASCET criteria bilaterally. 4. Additional mild for age atheromatous change about the aortic arch and carotid siphons without hemodynamically significant stenosis. These results were communicated to Rory Percy at 11:24 pmon 7/27/2020by text page via the [facility] messaging system. Electronically Signed   By: Pincus Badder.D.  On: 02/14/2019 23:38   Dg Shoulder Taylor  Result Date: 02/15/2019 CLINICAL DATA:  Seizure, Taylor shoulder pain EXAM: Taylor SHOULDER - 2+ VIEW COMPARISON:  12/07/2008 FINDINGS: No fracture or dislocation is seen. Mild degenerative changes of the acromioclavicular joint. No humeral joint is essentially preserved. Visualized soft tissues are within normal limits. Visualized Taylor lung is clear. IMPRESSION: No fracture or dislocation is seen. Mild degenerative changes of the glenohumeral joint. Electronically Signed   By: Julian Hy M.D.   On: 02/15/2019 02:10   Dg Wrist Complete Left  Result Date: 02/15/2019 CLINICAL DATA:  Left wrist pain, rheumatoid arthritis, possible seizure EXAM: LEFT WRIST - COMPLETE 3+ VIEW COMPARISON:  None. FINDINGS: No fracture or dislocation is seen. Mild degenerative changes of the radiocarpal articulation. Mild dorsal soft tissue swelling. IMPRESSION: No fracture or dislocation is seen. Mild dorsal soft tissue swelling. Electronically Signed   By: Julian Hy M.D.   On: 02/15/2019 02:10   Ct Angio Neck W Or Wo Contrast  Result Date: 02/14/2019 CLINICAL DATA:  Initial evaluation for acute left-sided weakness. EXAM: CT ANGIOGRAPHY HEAD AND NECK CT PERFUSION BRAIN TECHNIQUE: Multidetector CT imaging of the head and neck was performed using the standard protocol during bolus administration of intravenous contrast. Multiplanar CT image reconstructions and MIPs were obtained to evaluate the vascular anatomy. Carotid stenosis measurements (when applicable) are obtained utilizing  NASCET criteria, using the distal internal carotid diameter as the denominator. Multiphase CT imaging of the brain was performed following IV bolus contrast injection. Subsequent parametric perfusion maps were calculated using RAPID software. CONTRAST:  67mL OMNIPAQUE IOHEXOL 350 MG/ML SOLN COMPARISON:  Prior noncontrast head CT from earlier the same day. FINDINGS: CTA NECK FINDINGS Aortic arch: Visualized aortic arch of normal caliber with normal branch pattern. Mild atheromatous plaque within the aortic arch and about the origin of the great vessels without hemodynamically significant stenosis. Visualized subclavian arteries widely patent. Taylor carotid system: Taylor common carotid artery patent from its origin to the bifurcation without stenosis. Scattered mixed plaque about the proximal Taylor ICA with associated stenosis of up to approximately 50% by NASCET criteria. Taylor ICA patent distally to the skull base without stenosis, dissection, or occlusion. Left carotid system: Left common carotid artery patent from its origin to the bifurcation without hemodynamically significant stenosis. Scattered mixed plaque about the proximal left ICA with associated stenosis of up to approximately 50% by NASCET criteria. Left ICA patent distally to the skull base without stenosis, dissection, or occlusion. Vertebral arteries: Both vertebral arteries arise from the subclavian arteries. Vertebral arteries patent within the neck without stenosis, dissection, or occlusion. Skeleton: No acute osseous finding. No discrete lytic or blastic osseous lesions. C6 and C7 vertebral bodies are partially ankylosed with prominent anterior bridging osteophytic spurring. Other neck: No other acute soft tissue abnormality within the neck. Upper chest: Visualized upper chest demonstrates no acute finding. Review of the MIP images confirms the above findings CTA HEAD FINDINGS Anterior circulation: Petrous segments patent bilaterally. Mild  scattered plaque within the cavernous/supraclinoid ICAs without hemodynamically significant stenosis. ICA termini well perfused. A1 segments patent bilaterally. Normal anterior communicating artery. Anterior cerebral arteries patent to their distal aspects without stenosis. Dominant left ACA noted. M1 segments widely patent bilaterally. Normal MCA bifurcations. Distal MCA branches well perfused and symmetric. Posterior circulation: Focal non stenotic plaque noted within the left V4 segment as it crosses the dura into the cranial vault. The 4 segments otherwise unremarkable and patent to the vertebrobasilar junction without stenosis. Patent  Taylor PICA. Left PICA not seen. Basilar widely patent to its distal aspect. Superior cerebral arteries patent bilaterally. Both of the posterior cerebral arteries primarily supplied via the basilar and are well perfused to their distal aspects. Review of the MIP images confirms the above findings CT Brain Perfusion Findings: ASPECTS: 10 CBF (<30%) Volume: 52mL Perfusion (Tmax>6.0s) volume: 13mL Mismatch Volume: 13mL Infarction Location:Negative CT perfusion with no evidence for acute infarct. IMPRESSION: 1. Negative CTA with no evidence for emergent large vessel occlusion. 2. Negative CT perfusion with no evidence for acute core infarct or other perfusion abnormality. 3. Atheromatous plaque about the origin of the internal carotid arteries with associated 50% stenosis by NASCET criteria bilaterally. 4. Additional mild for age atheromatous change about the aortic arch and carotid siphons without hemodynamically significant stenosis. These results were communicated to Rory Percy at 11:24 pmon 7/27/2020by text page via the [facility] messaging system. Electronically Signed   By: Jeannine Boga M.D.   On: 02/14/2019 23:38   Mr Brain Wo Contrast  Result Date: 02/15/2019 CLINICAL DATA:  Initial evaluation for focal neural deficit, possible stroke. EXAM: MRI HEAD WITHOUT CONTRAST TECHNIQUE:  Multiplanar, multiecho pulse sequences of the brain and surrounding structures were obtained without intravenous contrast. COMPARISON:  Prior CT, CTA, and CT perfusion. FINDINGS: Brain: Mild age-related cerebral atrophy. Minimal patchy T2/FLAIR hyperintensity within the periventricular white matter, most consistent with chronic microvascular ischemic disease, felt to be within normal limits for age. No abnormal foci of restricted diffusion to suggest acute or subacute ischemia. Gray-white matter differentiation well maintained. No encephalomalacia to suggest chronic infarction. No foci of susceptibility artifact to suggest acute or chronic intracranial hemorrhage. No mass lesion, midline shift or mass effect. No hydrocephalus. No extra-axial fluid collection. Major dural sinuses are grossly patent. Pituitary gland and suprasellar region are normal. Midline structures intact and normal. Vascular: Major intracranial vascular flow voids well maintained and normal in appearance. Skull and upper cervical spine: Craniocervical junction normal. Visualized upper cervical spine within normal limits. Bone marrow signal intensity normal. No scalp soft tissue abnormality. Sinuses/Orbits: Globes and orbital soft tissues within normal limits. Paranasal sinuses are clear. No mastoid effusion. Inner ear structures normal. Other: None. IMPRESSION: Normal brain MRI for age. No acute intracranial infarct or other abnormality identified. Electronically Signed   By: Jeannine Boga M.D.   On: 02/15/2019 01:03   Ct Cerebral Perfusion W Contrast  Result Date: 02/14/2019 CLINICAL DATA:  Initial evaluation for acute left-sided weakness. EXAM: CT ANGIOGRAPHY HEAD AND NECK CT PERFUSION BRAIN TECHNIQUE: Multidetector CT imaging of the head and neck was performed using the standard protocol during bolus administration of intravenous contrast. Multiplanar CT image reconstructions and MIPs were obtained to evaluate the vascular anatomy.  Carotid stenosis measurements (when applicable) are obtained utilizing NASCET criteria, using the distal internal carotid diameter as the denominator. Multiphase CT imaging of the brain was performed following IV bolus contrast injection. Subsequent parametric perfusion maps were calculated using RAPID software. CONTRAST:  28mL OMNIPAQUE IOHEXOL 350 MG/ML SOLN COMPARISON:  Prior noncontrast head CT from earlier the same day. FINDINGS: CTA NECK FINDINGS Aortic arch: Visualized aortic arch of normal caliber with normal branch pattern. Mild atheromatous plaque within the aortic arch and about the origin of the great vessels without hemodynamically significant stenosis. Visualized subclavian arteries widely patent. Taylor carotid system: Taylor common carotid artery patent from its origin to the bifurcation without stenosis. Scattered mixed plaque about the proximal Taylor ICA with associated stenosis of up to approximately 50% by  NASCET criteria. Taylor ICA patent distally to the skull base without stenosis, dissection, or occlusion. Left carotid system: Left common carotid artery patent from its origin to the bifurcation without hemodynamically significant stenosis. Scattered mixed plaque about the proximal left ICA with associated stenosis of up to approximately 50% by NASCET criteria. Left ICA patent distally to the skull base without stenosis, dissection, or occlusion. Vertebral arteries: Both vertebral arteries arise from the subclavian arteries. Vertebral arteries patent within the neck without stenosis, dissection, or occlusion. Skeleton: No acute osseous finding. No discrete lytic or blastic osseous lesions. C6 and C7 vertebral bodies are partially ankylosed with prominent anterior bridging osteophytic spurring. Other neck: No other acute soft tissue abnormality within the neck. Upper chest: Visualized upper chest demonstrates no acute finding. Review of the MIP images confirms the above findings CTA HEAD FINDINGS  Anterior circulation: Petrous segments patent bilaterally. Mild scattered plaque within the cavernous/supraclinoid ICAs without hemodynamically significant stenosis. ICA termini well perfused. A1 segments patent bilaterally. Normal anterior communicating artery. Anterior cerebral arteries patent to their distal aspects without stenosis. Dominant left ACA noted. M1 segments widely patent bilaterally. Normal MCA bifurcations. Distal MCA branches well perfused and symmetric. Posterior circulation: Focal non stenotic plaque noted within the left V4 segment as it crosses the dura into the cranial vault. The 4 segments otherwise unremarkable and patent to the vertebrobasilar junction without stenosis. Patent Taylor PICA. Left PICA not seen. Basilar widely patent to its distal aspect. Superior cerebral arteries patent bilaterally. Both of the posterior cerebral arteries primarily supplied via the basilar and are well perfused to their distal aspects. Review of the MIP images confirms the above findings CT Brain Perfusion Findings: ASPECTS: 10 CBF (<30%) Volume: 73mL Perfusion (Tmax>6.0s) volume: 63mL Mismatch Volume: 30mL Infarction Location:Negative CT perfusion with no evidence for acute infarct. IMPRESSION: 1. Negative CTA with no evidence for emergent large vessel occlusion. 2. Negative CT perfusion with no evidence for acute core infarct or other perfusion abnormality. 3. Atheromatous plaque about the origin of the internal carotid arteries with associated 50% stenosis by NASCET criteria bilaterally. 4. Additional mild for age atheromatous change about the aortic arch and carotid siphons without hemodynamically significant stenosis. These results were communicated to Rory Percy at 11:24 pmon 7/27/2020by text page via the [facility] messaging system. Electronically Signed   By: Jeannine Boga M.D.   On: 02/14/2019 23:38   Ct Head Code Stroke Wo Contrast  Result Date: 02/14/2019 CLINICAL DATA:  Code stroke.  Left-sided  weakness. EXAM: CT HEAD WITHOUT CONTRAST TECHNIQUE: Contiguous axial images were obtained from the base of the skull through the vertex without intravenous contrast. COMPARISON:  None. FINDINGS: Brain: No evidence of acute infarction, hemorrhage, hydrocephalus, extra-axial collection or mass lesion/mass effect. Vascular: Negative for hyperdense vessel Skull: Negative Sinuses/Orbits: Negative Other: None ASPECTS (Matthews Stroke Program Early CT Score) - Ganglionic level infarction (caudate, lentiform nuclei, internal capsule, insula, M1-M3 cortex): 7 - Supraganglionic infarction (M4-M6 cortex): 3 Total score (0-10 with 10 being normal): 10 IMPRESSION: 1. Negative CT head 2. ASPECTS is 10 3. Text message result sent to Dr. Rory Percy. Electronically Signed   By: Franchot Gallo M.D.   On: 02/14/2019 21:57    EKG: Independently reviewed.  Sinus rhythm, heart rate 82.  Questionable mild ST elevation in anterior leads, no prior EKG for comparison.  Assessment/Plan Principal Problem:   AMS (altered mental status) Active Problems:   Taylor shoulder pain   Left wrist pain   Anemia   Alcohol dependence ([place])  Altered mental status, seizure versus possible syncope CT head negative for acute finding.  CT angiogram head and neck without evidence of emergent large vessel occlusion.  CT perfusion study negative for any perfusion deficits.  Brain MRI negative for acute finding. Seen by neurology and his symptoms are thought to be secondary to possible syncopal episode versus seizure.  Syncope could possibly be orthostatic from dehydration/heat exhaustion.  Currently on bupropion and Ambien at home which can lower seizure threshold. -Appreciate neurology recommendations -Cardiac monitoring -EEG -Echocardiogram -Check orthostatics -Seizure precautions  Acute Taylor shoulder pain, history of arthritis and chronic joint pains X-ray showing no fracture or dislocation.  Mild degenerative changes of the glenohumeral  joint. -Continue home Norco PRN per patient request  Left wrist pain Patient reported a mosquito bite prior to noticing swelling in the first dorsal webspace.  X-ray of left wrist showing mild dorsal soft tissue swelling and no fracture/dislocation.  Radial pulse intact and no paresthesias. -Benadryl PRN -Tylenol PRN -Continue home Norco PRN  Mild leukocytosis White count 11.7.  Likely reactive.  Patient is afebrile.  Lungs clear and UA not suggestive of infection. -Repeat CBC in a.m.  Macrocytic anemia Likely secondary to ethanol use.  Hemoglobin 12.8 and MCV 101.8, no recent baseline.  No signs of active bleeding. -Anemia panel  Normal anion gap metabolic acidosis ? Related to seizure activity.  Bicarb 17, anion gap 12 initially.  Repeat bicarb 20 after IV fluid.  Patient is not endorsing diarrhea and is currently not on a diuretic. -Continue to monitor BMP  EKG abnormality EKG with questionable mild ST elevation in anterior leads, no prior EKG for comparison.  Patient denies any chest pain.  ACS less likely as high-sensitivity troponin checked twice negative. -Cardiac monitoring -Repeat EKG in a.m.  Alcohol dependence -CIWA protocol; Ativan PRN -Thiamine, folate, multivitamin  Unable to safely order remainder of home medications at this time as pharmacy medication reconciliation is pending.  DVT prophylaxis: Lovenox Code Status: Patient wishes to be full code. Family Communication: Wife updated at bedside. Disposition Plan: Anticipate discharge after clinical improvement. Consults called: Neurology Admission status: It is my clinical opinion that referral for OBSERVATION is reasonable and necessary in this patient based on the above information provided. The aforementioned taken together are felt to place the patient at high risk for further clinical deterioration. However it is anticipated that the patient may be medically stable for discharge from the hospital within 24 to  48 hours.  The medical decision making on this patient was of high complexity and the patient is at high risk for clinical deterioration, therefore this is a level 3 visit.  Shela Leff MD [facility] Pager [phone]  If 7PM-7AM, please contact night-coverage [email or web address] Password [facility]  02/15/2019, 5:42 AM

## 2019-02-15 NOTE — ED Notes (Signed)
Breakfast Tray ordered  

## 2019-02-15 NOTE — ED Notes (Addendum)
Pt called out, stating he was not feeling well, feels "hot, nauseated and headache." Pt refusing ativan at this time (ciwa 8). Repositioned, cool towel given.

## 2019-02-15 NOTE — ED Notes (Signed)
PT at bedside.

## 2019-02-15 NOTE — Progress Notes (Signed)
Patient's HR increased to 130-140 with movement/standing at 1808. Patient states he had not take any of his daily meds.  Dr. Lonny Prude notified.  New orders received.

## 2019-02-15 NOTE — Progress Notes (Signed)
Patient seen and examined at bedside, patient admitted after midnight, please see earlier detailed admission note by Shela Leff, MD. Briefly, patient presented secondary to syncope and fall. Initial concern for possible seizure. Workup so far has been negative. Transthoracic Echocardiogram pending. Will continue telemetry for a full 24 hours prior to considering discharge. PT evaluated and recommending home health PT (orders placed).   Cordelia Poche, MD [facility] 02/15/2019, 2:48 PM

## 2019-02-15 NOTE — ED Notes (Signed)
Pt given sprite 

## 2019-02-15 NOTE — ED Provider Notes (Signed)
1:06 AM Assumed care from Dr. Gilford Raid, please see their note for full history, physical and decision making until this point. In brief this is a 63 y.o. year old male who presented to the ED tonight with Code Stroke     Code stroke, symptoms resolved PTA but likely heat exhaustion vs seizure like activiity. Pending MRI and admit for Acute AMS/EEG.   On my eval, neuro intact, right shoulder pain and left wrist pain. Will obtain XR's to ensure no traumatic injuries.   Labs, studies and imaging reviewed by myself and considered in medical decision making if ordered. Imaging interpreted by radiology.  Labs Reviewed  CBC - Abnormal; Notable for the following components:      Result Value   WBC 11.7 (*)    RBC 3.79 (*)    Hemoglobin 12.8 (*)    HCT 38.6 (*)    MCV 101.8 (*)    All other components within normal limits  DIFFERENTIAL - Abnormal; Notable for the following components:   Neutro Abs 8.2 (*)    All other components within normal limits  COMPREHENSIVE METABOLIC PANEL - Abnormal; Notable for the following components:   CO2 17 (*)    Glucose, Bld 101 (*)    AST 42 (*)    All other components within normal limits  RAPID URINE DRUG SCREEN, HOSP PERFORMED - Abnormal; Notable for the following components:   Opiates POSITIVE (*)    All other components within normal limits  URINALYSIS, ROUTINE W REFLEX MICROSCOPIC - Abnormal; Notable for the following components:   Color, Urine STRAW (*)    Hgb urine dipstick SMALL (*)    All other components within normal limits  I-STAT CHEM 8, ED - Abnormal; Notable for the following components:   Calcium, Ion 1.12 (*)    TCO2 18 (*)    All other components within normal limits  SARS CORONAVIRUS 2 (HOSPITAL ORDER, [place] [facility])  ETHANOL  PROTIME-INR  APTT  CBG MONITORING, ED  TROPONIN I (HIGH SENSITIVITY)  TROPONIN I (HIGH SENSITIVITY)    MR BRAIN WO CONTRAST  Final Result    CT ANGIO HEAD W OR WO CONTRAST   Final Result    CT ANGIO NECK W OR WO CONTRAST  Final Result    CT CEREBRAL PERFUSION W CONTRAST  Final Result    CT HEAD CODE STROKE WO CONTRAST  Final Result    DG Wrist Complete Left    (Results Pending)  DG Shoulder Right    (Results Pending)    No follow-ups on file.    Dickie Labarre, Corene Cornea, MD 02/16/19 [phone]

## 2019-02-15 NOTE — Progress Notes (Signed)
EEG was within normal limits. No seizures or clear epileptiform discharges were seen throughout the recording.  MRI brain is normal for age. No acute intracranial infarct or other abnormality identified.  Neurology will sign off. Please call if there are additional questions.   Electronically signed: Dr. Kerney Elbe

## 2019-02-15 NOTE — Procedures (Signed)
Patient Name: Eddie Taylor  MRN: [id number]  Epilepsy Attending: Lora Havens  Referring Physician/Provider: Dr Derrick Ravel Date: 02/15/19 Duration: 25.30 mins  Patient history: 63 yo M with seizure like episode. EEG to evaluate for seizures.  Level of alertness: awake and drowsy  AEDs during EEG study: None  Technical aspects: This EEG study was done with scalp electrodes positioned according to the 10-20 International system of electrode placement. Electrical activity was acquired at a sampling rate of 500Hz  and reviewed with a high frequency filter of 70Hz  and a low frequency filter of 1Hz . EEG data were recorded continuously and digitally stored.   DESCRIPTION: The posterior dominant rhythm consists of 10-11 Hz activity of moderate voltage (25-35 uV) seen predominantly in posterior head regions, symmetric and reactive to eye opening and eye closing.  Drowsiness was characterized by attenuation of the posterior background rhythm.  IMPRESSION: This study is within normal limits. No seizures or clear epileptiform discharges were seen throughout the recording.   Sherran Margolis Barbra Sarks

## 2019-02-16 DIAGNOSIS — E860 Dehydration: Secondary | ICD-10-CM

## 2019-02-16 LAB — GLUCOSE, CAPILLARY: Glucose-Capillary: 129 mg/dL — ABNORMAL HIGH (ref 70–99)

## 2019-02-16 LAB — BASIC METABOLIC PANEL
Anion gap: 9 (ref 5–15)
BUN: 7 mg/dL — ABNORMAL LOW (ref 8–23)
CO2: 21 mmol/L — ABNORMAL LOW (ref 22–32)
Calcium: 8.8 mg/dL — ABNORMAL LOW (ref 8.9–10.3)
Chloride: 107 mmol/L (ref 98–111)
Creatinine, Ser: 0.68 mg/dL (ref 0.61–1.24)
GFR calc Af Amer: >60 mL/min (ref >60)
GFR calc non Af Amer: >60 mL/min (ref >60)
Glucose, Bld: 103 mg/dL — ABNORMAL HIGH (ref 70–99)
Potassium: 3.6 mmol/L (ref 3.5–5.1)
Sodium: 137 mmol/L (ref 135–145)

## 2019-02-16 LAB — CBC
HCT: 36.2 % — ABNORMAL LOW (ref 39.0–52.0)
Hemoglobin: 12.1 g/dL — ABNORMAL LOW (ref 13.0–17.0)
MCH: 33.8 pg (ref 26.0–34.0)
MCHC: 33.4 g/dL (ref 30.0–36.0)
MCV: 101.1 fL — ABNORMAL HIGH (ref 80.0–100.0)
Platelets: 169 10*3/uL (ref 150–400)
RBC: 3.58 MIL/uL — ABNORMAL LOW (ref 4.22–5.81)
RDW: 14.2 % (ref 11.5–15.5)
WBC: 7.8 10*3/uL (ref 4.0–10.5)
nRBC: 0 % (ref 0.0–0.2)

## 2019-02-16 NOTE — Care Management Obs Status (Signed)
[place] NOTIFICATION   Patient Details  Name: FERRIS FIELDEN MRN: [id number] Date of Birth: 1956-06-08   Medicare Observation Status Notification Given:  Yes    Bethena Roys, RN 02/16/2019, 10:46 AM

## 2019-02-16 NOTE — Discharge Instructions (Signed)
Dehydration, Adult  Dehydration is when there is not enough fluid or water in your body. This happens when you lose more fluids than you take in. Dehydration can range from mild to very bad. It should be treated right away to keep it from getting very bad. Symptoms of mild dehydration may include:  Thirst.  Dry lips.  Slightly dry mouth.  Dry, warm skin.  Dizziness. Symptoms of moderate dehydration may include:  Very dry mouth.  Muscle cramps.  Dark pee (urine). Pee may be the color of tea.  Your body making less pee.  Your eyes making fewer tears.  Heartbeat that is uneven or faster than normal (palpitations).  Headache.  Light-headedness, especially when you stand up from sitting.  Fainting (syncope). Symptoms of very bad dehydration may include:  Changes in skin, such as: ? Cold and clammy skin. ? Blotchy (mottled) or pale skin. ? Skin that does not quickly return to normal after being lightly pinched and let go (poor skin turgor).  Changes in body fluids, such as: ? Feeling very thirsty. ? Your eyes making fewer tears. ? Not sweating when body temperature is high, such as in hot weather. ? Your body making very little pee.  Changes in vital signs, such as: ? Weak pulse. ? Pulse that is more than 100 beats a minute when you are sitting still. ? Fast breathing. ? Low blood pressure.  Other changes, such as: ? Sunken eyes. ? Cold hands and feet. ? Confusion. ? Lack of energy (lethargy). ? Trouble waking up from sleep. ? Short-term weight loss. ? Unconsciousness. Follow these instructions at home:   If told by your doctor, drink an ORS: ? Make an ORS by using instructions on the package. ? Start by drinking small amounts, about  cup (120 mL) every 5-10 minutes. ? Slowly drink more until you have had the amount that your doctor said to have.  Drink enough clear fluid to keep your pee clear or pale yellow. If you were told to drink an ORS, finish the  ORS first, then start slowly drinking clear fluids. Drink fluids such as: ? Water. Do not drink only water by itself. Doing that can make the salt (sodium) level in your body get too low (hyponatremia). ? Ice chips. ? Fruit juice that you have added water to (diluted). ? Low-calorie sports drinks.  Avoid: ? Alcohol. ? Drinks that have a lot of sugar. These include high-calorie sports drinks, fruit juice that does not have water added, and soda. ? Caffeine. ? Foods that are greasy or have a lot of fat or sugar.  Take over-the-counter and prescription medicines only as told by your doctor.  Do not take salt tablets. Doing that can make the salt level in your body get too high (hypernatremia).  Eat foods that have minerals (electrolytes). Examples include bananas, oranges, potatoes, tomatoes, and spinach.  Keep all follow-up visits as told by your doctor. This is important. Contact a doctor if:  You have belly (abdominal) pain that: ? Gets worse. ? Stays in one area (localizes).  You have a rash.  You have a stiff neck.  You get angry or annoyed more easily than normal (irritability).  You are more sleepy than normal.  You have a harder time waking up than normal.  You feel: ? Weak. ? Dizzy. ? Very thirsty.  You have peed (urinated) only a small amount of very dark pee during 6-8 hours. Get help right away if:  You have  symptoms of very bad dehydration.  You cannot drink fluids without throwing up (vomiting).  Your symptoms get worse with treatment.  You have a fever.  You have a very bad headache.  You are throwing up or having watery poop (diarrhea) and it: ? Gets worse. ? Does not go away.  You have blood or something green (bile) in your throw-up.  You have blood in your poop (stool). This may cause poop to look black and tarry.  You have not peed in 6-8 hours.  You pass out (faint).  Your heart rate when you are sitting still is more than 100 beats a  minute.  You have trouble breathing. This information is not intended to replace advice given to you by your health care provider. Make sure you discuss any questions you have with your health care provider. Document Released: 05/03/2009 Document Revised: 06/19/2017 Document Reviewed: 08/31/2015 Elsevier Patient Education  2020 [employer]. Syncope Syncope is when you pass out (faint) for a short time. It is caused by a sudden decrease in blood flow to the brain. Signs that you may be about to pass out include:  Feeling dizzy or light-headed.  Feeling sick to your stomach (nauseous).  Seeing all white or all black.  Having cold, clammy skin. If you pass out, get help right away. Call your local emergency services (911 in the [place] Do not drive yourself to the hospital. Follow these instructions at home: Watch for any changes in your symptoms. Take these actions to stay safe and help with your symptoms: Lifestyle  Do not drive, use machinery, or play sports until your doctor says it is okay.  Do not drink alcohol.  Do not use any products that contain nicotine or tobacco, such as cigarettes and e-cigarettes. If you need help quitting, ask your doctor.  Drink enough fluid to keep your pee (urine) pale yellow. General instructions  Take over-the-counter and prescription medicines only as told by your doctor.  If you are taking blood pressure or heart medicine, sit up and stand up slowly. Spend a few minutes getting ready to sit and then stand. This can help you feel less dizzy.  Have someone stay with you until you feel stable.  If you start to feel like you might pass out, lie down right away and raise (elevate) your feet above the level of your heart. Breathe deeply and steadily. Wait until all of the symptoms are gone.  Keep all follow-up visits as told by your doctor. This is important. Get help right away if:  You have a very bad headache.  You pass out once or more  than once.  You have pain in your chest, belly, or back.  You have a very fast or uneven heartbeat (palpitations).  It hurts to breathe.  You are bleeding from your mouth or your bottom (rectum).  You have black or tarry poop (stool).  You have jerky movements that you cannot control (seizure).  You are confused.  You have trouble walking.  You are very weak.  You have vision problems. These symptoms may be an emergency. Do not wait to see if the symptoms will go away. Get medical help right away. Call your local emergency services (911 in the [place] Do not drive yourself to the hospital. Summary  Syncope is when you pass out (faint) for a short time. It is caused by a sudden decrease in blood flow to the brain.  Signs that you may be about to  faint include feeling dizzy, light-headed, or sick to your stomach, seeing all white or all black, or having cold, clammy skin.  If you start to feel like you might pass out, lie down right away and raise (elevate) your feet above the level of your heart. Breathe deeply and steadily. Wait until all of the symptoms are gone. This information is not intended to replace advice given to you by your health care provider. Make sure you discuss any questions you have with your health care provider. Document Released: 12/24/2007 Document Revised: 08/19/2017 Document Reviewed: 08/19/2017 Elsevier Patient Education  2020 [employer].

## 2019-02-16 NOTE — Discharge Summary (Signed)
Physician Discharge Summary  [name] ASN:[id number] DOB: 09/09/1955 DOA: 02/14/2019  PCP: Greig Right, MD  Admit date: 02/14/2019 Discharge date: 02/16/2019  Time spent: 40 minutes  Recommendations for Outpatient Follow-up:  1. Follow up with PCP 2-3 weeks for evaluation of symptoms, recommend cbc track Hg and bmet track electrolytes 2. Home health PT for strength and endurance 3. Avoid ETOH 4. Maintain hydration   Discharge Diagnoses:  Principal Problem:   AMS (altered mental status) Active Problems:   Right shoulder pain   Left wrist pain   Anemia   Alcohol dependence ([place])   Discharge Condition: stable  Diet recommendation heart healthy  Filed Weights   02/14/19 2229 02/15/19 1600 02/16/19 0425  Weight: 85.2 kg 84.1 kg 83.8 kg    History of present illness:  Eddie Taylor is a 63 y.o. male with medical history significant of arthritis on pain medications, diverticulitis status post colostomy, gastric ulcer presenting to the hospital via EMS for evaluation of altered mental status.  EMS noted left-sided weakness and left gaze preference.  History provided by patient and wife at bedside.  Patient was in his usual state of health until  afternoon when he went outside to clean the house around 2 or 3 PM.  He then returned to the house to cool down as it was very hot outdoors.  He had Sprite to drink and watched TV.  Some friends came over and he had a few beers.  He then went to the swimming pool with his friends.  States a mosquito bit him at his left wrist.  Wife stated patient had returned back to the house and around 6 PM she noticed that he was incoherent, his body was shaking, and he was sweating profusely.  She noticed foam coming out of his mouth.  Denied any prior history of seizures.  Stated she checked his blood pressure and it was 90s over 60s.  Stated symptoms lasted about 15 to 20 minutes.  She tried to walk him but his gait was unsteady and he slid down from  her arms.  He did not hit his head.  Patient stated since falling he  having excruciating pain in his right shoulder.  Stated since the mosquito bit him at his left wrist, he is also having excruciating pain there.  Reported having chronic diffuse joint pains due to rheumatoid arthritis for which he takes Norco at home.  Patient denied any fevers, chills, chest pain, abdominal pain, or lightheadedness/dizziness.  No other complaints.  Stated he drinks over 6 beers every other day on a regular basis.  Hospital Course:  Altered mental status, seizure versus possible syncope CT head negative for acute finding.  CT angiogram head and neck without evidence of emergent large vessel occlusion.  CT perfusion study negative for any perfusion deficits.  Brain MRI negative for acute finding. EEG within limits of normal. Echo with EF 60% and moderately increased left ventricular wall thickness.  Seen by neurology who opine symptoms likely secondary to possible syncopal episode versus seizure.  Syncope could possibly be orthostatic from dehydration/heat exhaustion.  Currently on bupropion and Ambien at home which can lower seizure threshold. On admission SBP went from 148 to 129 and stable after 3 minutes. Not orthostatic at discharge. No further episodes. Encouraged to minimize ETOH use and maintain hydration.    Acute right shoulder pain, history of arthritis and chronic joint pains. X-ray showing no fracture or dislocation.  Mild degenerative changes of the glenohumeral  joint. Continue home Norco PRN per patient request  Left wrist pain Patient reported a mosquito bite prior to noticing swelling in the first dorsal webspace.  X-ray of left wrist showing mild dorsal soft tissue swelling and no fracture/dislocation.  Radial pulse intact and no paresthesias. Benadryl PRN  Mild leukocytosis White count 11.7 on admission. At discharge 7.8.  Likely reactive.  Patient is afebrile.  Lungs clear and UA not suggestive of  infection.  Macrocytic anemia Likely secondary to ethanol use.  Hemoglobin 12.8 and MCV 101.8, no recent baseline.  No signs of active bleeding. OP follow up.   Normal anion gap metabolic acidosis. Likely related to above.   Bicarb 17, anion gap 12 initially. Bicarb 21 at discharge after IV fluid.  Patient is not endorsing diarrhea and is currently not on a diuretic. OP follow up.   EKG abnormality EKG with questionable mild ST elevation in anterior leads, no prior EKG for comparison.  Patient denies any chest pain.  ACS less likely as high-sensitivity troponin checked twice negative. No events on tele. .  Alcohol dependence no s/sx withdrawal. Encouraged to minimize ETOH use   Procedures:  echo  Consultations:  [facility]  Discharge Exam: Vitals:   02/16/19 0423 02/16/19 0759  BP: 121/87   Pulse: 72 87  Resp:    Temp: 98 F (36.7 C)   SpO2: 97%     General: awake alert no acute distress Cardiovascular: rrr no mgr no LE edema Respiratory: normal effort BS clear bilaterally no wheeze  Discharge Instructions   Discharge Instructions    Diet - low sodium heart healthy   Complete by: As directed    Discharge instructions   Complete by: As directed    Take medications as prescribed.  Avoid ETOH use   Increase activity slowly   Complete by: As directed      Allergies as of 02/16/2019      Reactions   Morphine And Related    "i go beside myself, i dont know. Its not pretty."    Prozac [fluoxetine Hcl] Itching   Sulfa Antibiotics Other (See Comments)   Doesn't feel well when taking       Medication List    STOP taking these medications   bacitracin ointment   cephALEXin 500 MG capsule Commonly known as: KEFLEX   doxycycline 100 MG capsule Commonly known as: VIBRAMYCIN   sucralfate 1 g tablet Commonly known as: CARAFATE     TAKE these medications   allopurinol 300 MG tablet Commonly known as: ZYLOPRIM Take 300 mg by mouth daily.   Humira 40  MG/0.8ML Pskt Generic drug: Adalimumab Inject 40 mg into the muscle every 14 (fourteen) days.   HYDROcodone-acetaminophen 7.5-325 MG tablet Commonly known as: NORCO Take 1 tablet by mouth every 6 (six) hours as needed for moderate pain.   lisinopril 5 MG tablet Commonly known as: ZESTRIL Take 5 mg by mouth daily.   metoprolol succinate 50 MG 24 hr tablet Commonly known as: TOPROL-XL Take 50 mg by mouth daily.   pantoprazole 40 MG tablet Commonly known as: PROTONIX Take 1 tablet (40 mg total) by mouth 2 (two) times daily with a meal. What changed: when to take this   pravastatin 40 MG tablet Commonly known as: PRAVACHOL Take 40 mg by mouth daily.   Vascepa 1 g Caps Generic drug: Icosapent Ethyl Take 2 g by mouth daily.   zolpidem 10 MG tablet Commonly known as: AMBIEN Take 10 mg by mouth  at bedtime.      Allergies  Allergen Reactions  . Morphine And Related     "i go beside myself, i dont know. Its not pretty."   . Prozac [Fluoxetine Hcl] Itching  . Sulfa Antibiotics Other (See Comments)    Doesn't feel well when taking    Follow-up Information    [facility] Follow up.   Specialty: Home Health Services Why: Physical Therapy- office to call with a visit time.  Contact information: [address] [place] [address] [phone]            The results of significant diagnostics from this hospitalization (including imaging, microbiology, ancillary and laboratory) are listed below for reference.    Significant Diagnostic Studies: Ct Angio Head W Or Wo Contrast  Result Date: 02/14/2019 CLINICAL DATA:  Initial evaluation for acute left-sided weakness. EXAM: CT ANGIOGRAPHY HEAD AND NECK CT PERFUSION BRAIN TECHNIQUE: Multidetector CT imaging of the head and neck was performed using the standard protocol during bolus administration of intravenous contrast. Multiplanar CT image reconstructions and MIPs were obtained to evaluate the vascular anatomy.  Carotid stenosis measurements (when applicable) are obtained utilizing NASCET criteria, using the distal internal carotid diameter as the denominator. Multiphase CT imaging of the brain was performed following IV bolus contrast injection. Subsequent parametric perfusion maps were calculated using RAPID software. CONTRAST:  74mL OMNIPAQUE IOHEXOL 350 MG/ML SOLN COMPARISON:  Prior noncontrast head CT from earlier the same day. FINDINGS: CTA NECK FINDINGS Aortic arch: Visualized aortic arch of normal caliber with normal branch pattern. Mild atheromatous plaque within the aortic arch and about the origin of the great vessels without hemodynamically significant stenosis. Visualized subclavian arteries widely patent. Right carotid system: Right common carotid artery patent from its origin to the bifurcation without stenosis. Scattered mixed plaque about the proximal right ICA with associated stenosis of up to approximately 50% by NASCET criteria. Right ICA patent distally to the skull base without stenosis, dissection, or occlusion. Left carotid system: Left common carotid artery patent from its origin to the bifurcation without hemodynamically significant stenosis. Scattered mixed plaque about the proximal left ICA with associated stenosis of up to approximately 50% by NASCET criteria. Left ICA patent distally to the skull base without stenosis, dissection, or occlusion. Vertebral arteries: Both vertebral arteries arise from the subclavian arteries. Vertebral arteries patent within the neck without stenosis, dissection, or occlusion. Skeleton: No acute osseous finding. No discrete lytic or blastic osseous lesions. C6 and C7 vertebral bodies are partially ankylosed with prominent anterior bridging osteophytic spurring. Other neck: No other acute soft tissue abnormality within the neck. Upper chest: Visualized upper chest demonstrates no acute finding. Review of the MIP images confirms the above findings CTA HEAD FINDINGS  Anterior circulation: Petrous segments patent bilaterally. Mild scattered plaque within the cavernous/supraclinoid ICAs without hemodynamically significant stenosis. ICA termini well perfused. A1 segments patent bilaterally. Normal anterior communicating artery. Anterior cerebral arteries patent to their distal aspects without stenosis. Dominant left ACA noted. M1 segments widely patent bilaterally. Normal MCA bifurcations. Distal MCA branches well perfused and symmetric. Posterior circulation: Focal non stenotic plaque noted within the left V4 segment as it crosses the dura into the cranial vault. The 4 segments otherwise unremarkable and patent to the vertebrobasilar junction without stenosis. Patent right PICA. Left PICA not seen. Basilar widely patent to its distal aspect. Superior cerebral arteries patent bilaterally. Both of the posterior cerebral arteries primarily supplied via the basilar and are well perfused to their  distal aspects. Review of the MIP images confirms the above findings CT Brain Perfusion Findings: ASPECTS: 10 CBF (<30%) Volume: 16mL Perfusion (Tmax>6.0s) volume: 54mL Mismatch Volume: 61mL Infarction Location:Negative CT perfusion with no evidence for acute infarct. IMPRESSION: 1. Negative CTA with no evidence for emergent large vessel occlusion. 2. Negative CT perfusion with no evidence for acute core infarct or other perfusion abnormality. 3. Atheromatous plaque about the origin of the internal carotid arteries with associated 50% stenosis by NASCET criteria bilaterally. 4. Additional mild for age atheromatous change about the aortic arch and carotid siphons without hemodynamically significant stenosis. These results were communicated to Rory Percy at 11:24 pmon 7/27/2020by text page via the [facility] messaging system. Electronically Signed   By: Jeannine Boga M.D.   On: 02/14/2019 23:38   Dg Shoulder Right  Result Date: 02/15/2019 CLINICAL DATA:  Seizure, right shoulder pain EXAM: RIGHT  SHOULDER - 2+ VIEW COMPARISON:  12/07/2008 FINDINGS: No fracture or dislocation is seen. Mild degenerative changes of the acromioclavicular joint. No humeral joint is essentially preserved. Visualized soft tissues are within normal limits. Visualized right lung is clear. IMPRESSION: No fracture or dislocation is seen. Mild degenerative changes of the glenohumeral joint. Electronically Signed   By: Julian Hy M.D.   On: 02/15/2019 02:10   Dg Wrist Complete Left  Result Date: 02/15/2019 CLINICAL DATA:  Left wrist pain, rheumatoid arthritis, possible seizure EXAM: LEFT WRIST - COMPLETE 3+ VIEW COMPARISON:  None. FINDINGS: No fracture or dislocation is seen. Mild degenerative changes of the radiocarpal articulation. Mild dorsal soft tissue swelling. IMPRESSION: No fracture or dislocation is seen. Mild dorsal soft tissue swelling. Electronically Signed   By: Julian Hy M.D.   On: 02/15/2019 02:10   Ct Angio Neck W Or Wo Contrast  Result Date: 02/14/2019 CLINICAL DATA:  Initial evaluation for acute left-sided weakness. EXAM: CT ANGIOGRAPHY HEAD AND NECK CT PERFUSION BRAIN TECHNIQUE: Multidetector CT imaging of the head and neck was performed using the standard protocol during bolus administration of intravenous contrast. Multiplanar CT image reconstructions and MIPs were obtained to evaluate the vascular anatomy. Carotid stenosis measurements (when applicable) are obtained utilizing NASCET criteria, using the distal internal carotid diameter as the denominator. Multiphase CT imaging of the brain was performed following IV bolus contrast injection. Subsequent parametric perfusion maps were calculated using RAPID software. CONTRAST:  63mL OMNIPAQUE IOHEXOL 350 MG/ML SOLN COMPARISON:  Prior noncontrast head CT from earlier the same day. FINDINGS: CTA NECK FINDINGS Aortic arch: Visualized aortic arch of normal caliber with normal branch pattern. Mild atheromatous plaque within the aortic arch and about  the origin of the great vessels without hemodynamically significant stenosis. Visualized subclavian arteries widely patent. Right carotid system: Right common carotid artery patent from its origin to the bifurcation without stenosis. Scattered mixed plaque about the proximal right ICA with associated stenosis of up to approximately 50% by NASCET criteria. Right ICA patent distally to the skull base without stenosis, dissection, or occlusion. Left carotid system: Left common carotid artery patent from its origin to the bifurcation without hemodynamically significant stenosis. Scattered mixed plaque about the proximal left ICA with associated stenosis of up to approximately 50% by NASCET criteria. Left ICA patent distally to the skull base without stenosis, dissection, or occlusion. Vertebral arteries: Both vertebral arteries arise from the subclavian arteries. Vertebral arteries patent within the neck without stenosis, dissection, or occlusion. Skeleton: No acute osseous finding. No discrete lytic or blastic osseous lesions. C6 and C7 vertebral bodies are partially ankylosed with  prominent anterior bridging osteophytic spurring. Other neck: No other acute soft tissue abnormality within the neck. Upper chest: Visualized upper chest demonstrates no acute finding. Review of the MIP images confirms the above findings CTA HEAD FINDINGS Anterior circulation: Petrous segments patent bilaterally. Mild scattered plaque within the cavernous/supraclinoid ICAs without hemodynamically significant stenosis. ICA termini well perfused. A1 segments patent bilaterally. Normal anterior communicating artery. Anterior cerebral arteries patent to their distal aspects without stenosis. Dominant left ACA noted. M1 segments widely patent bilaterally. Normal MCA bifurcations. Distal MCA branches well perfused and symmetric. Posterior circulation: Focal non stenotic plaque noted within the left V4 segment as it crosses the dura into the  cranial vault. The 4 segments otherwise unremarkable and patent to the vertebrobasilar junction without stenosis. Patent right PICA. Left PICA not seen. Basilar widely patent to its distal aspect. Superior cerebral arteries patent bilaterally. Both of the posterior cerebral arteries primarily supplied via the basilar and are well perfused to their distal aspects. Review of the MIP images confirms the above findings CT Brain Perfusion Findings: ASPECTS: 10 CBF (<30%) Volume: 34mL Perfusion (Tmax>6.0s) volume: 23mL Mismatch Volume: 39mL Infarction Location:Negative CT perfusion with no evidence for acute infarct. IMPRESSION: 1. Negative CTA with no evidence for emergent large vessel occlusion. 2. Negative CT perfusion with no evidence for acute core infarct or other perfusion abnormality. 3. Atheromatous plaque about the origin of the internal carotid arteries with associated 50% stenosis by NASCET criteria bilaterally. 4. Additional mild for age atheromatous change about the aortic arch and carotid siphons without hemodynamically significant stenosis. These results were communicated to Rory Percy at 11:24 pmon 7/27/2020by text page via the [facility] messaging system. Electronically Signed   By: Jeannine Boga M.D.   On: 02/14/2019 23:38   Mr Brain Wo Contrast  Result Date: 02/15/2019 CLINICAL DATA:  Initial evaluation for focal neural deficit, possible stroke. EXAM: MRI HEAD WITHOUT CONTRAST TECHNIQUE: Multiplanar, multiecho pulse sequences of the brain and surrounding structures were obtained without intravenous contrast. COMPARISON:  Prior CT, CTA, and CT perfusion. FINDINGS: Brain: Mild age-related cerebral atrophy. Minimal patchy T2/FLAIR hyperintensity within the periventricular white matter, most consistent with chronic microvascular ischemic disease, felt to be within normal limits for age. No abnormal foci of restricted diffusion to suggest acute or subacute ischemia. Gray-white matter differentiation well  maintained. No encephalomalacia to suggest chronic infarction. No foci of susceptibility artifact to suggest acute or chronic intracranial hemorrhage. No mass lesion, midline shift or mass effect. No hydrocephalus. No extra-axial fluid collection. Major dural sinuses are grossly patent. Pituitary gland and suprasellar region are normal. Midline structures intact and normal. Vascular: Major intracranial vascular flow voids well maintained and normal in appearance. Skull and upper cervical spine: Craniocervical junction normal. Visualized upper cervical spine within normal limits. Bone marrow signal intensity normal. No scalp soft tissue abnormality. Sinuses/Orbits: Globes and orbital soft tissues within normal limits. Paranasal sinuses are clear. No mastoid effusion. Inner ear structures normal. Other: None. IMPRESSION: Normal brain MRI for age. No acute intracranial infarct or other abnormality identified. Electronically Signed   By: Jeannine Boga M.D.   On: 02/15/2019 01:03   Ct Cerebral Perfusion W Contrast  Result Date: 02/14/2019 CLINICAL DATA:  Initial evaluation for acute left-sided weakness. EXAM: CT ANGIOGRAPHY HEAD AND NECK CT PERFUSION BRAIN TECHNIQUE: Multidetector CT imaging of the head and neck was performed using the standard protocol during bolus administration of intravenous contrast. Multiplanar CT image reconstructions and MIPs were obtained to evaluate the vascular anatomy. Carotid stenosis measurements (  when applicable) are obtained utilizing NASCET criteria, using the distal internal carotid diameter as the denominator. Multiphase CT imaging of the brain was performed following IV bolus contrast injection. Subsequent parametric perfusion maps were calculated using RAPID software. CONTRAST:  49mL OMNIPAQUE IOHEXOL 350 MG/ML SOLN COMPARISON:  Prior noncontrast head CT from earlier the same day. FINDINGS: CTA NECK FINDINGS Aortic arch: Visualized aortic arch of normal caliber with normal  branch pattern. Mild atheromatous plaque within the aortic arch and about the origin of the great vessels without hemodynamically significant stenosis. Visualized subclavian arteries widely patent. Right carotid system: Right common carotid artery patent from its origin to the bifurcation without stenosis. Scattered mixed plaque about the proximal right ICA with associated stenosis of up to approximately 50% by NASCET criteria. Right ICA patent distally to the skull base without stenosis, dissection, or occlusion. Left carotid system: Left common carotid artery patent from its origin to the bifurcation without hemodynamically significant stenosis. Scattered mixed plaque about the proximal left ICA with associated stenosis of up to approximately 50% by NASCET criteria. Left ICA patent distally to the skull base without stenosis, dissection, or occlusion. Vertebral arteries: Both vertebral arteries arise from the subclavian arteries. Vertebral arteries patent within the neck without stenosis, dissection, or occlusion. Skeleton: No acute osseous finding. No discrete lytic or blastic osseous lesions. C6 and C7 vertebral bodies are partially ankylosed with prominent anterior bridging osteophytic spurring. Other neck: No other acute soft tissue abnormality within the neck. Upper chest: Visualized upper chest demonstrates no acute finding. Review of the MIP images confirms the above findings CTA HEAD FINDINGS Anterior circulation: Petrous segments patent bilaterally. Mild scattered plaque within the cavernous/supraclinoid ICAs without hemodynamically significant stenosis. ICA termini well perfused. A1 segments patent bilaterally. Normal anterior communicating artery. Anterior cerebral arteries patent to their distal aspects without stenosis. Dominant left ACA noted. M1 segments widely patent bilaterally. Normal MCA bifurcations. Distal MCA branches well perfused and symmetric. Posterior circulation: Focal non stenotic  plaque noted within the left V4 segment as it crosses the dura into the cranial vault. The 4 segments otherwise unremarkable and patent to the vertebrobasilar junction without stenosis. Patent right PICA. Left PICA not seen. Basilar widely patent to its distal aspect. Superior cerebral arteries patent bilaterally. Both of the posterior cerebral arteries primarily supplied via the basilar and are well perfused to their distal aspects. Review of the MIP images confirms the above findings CT Brain Perfusion Findings: ASPECTS: 10 CBF (<30%) Volume: 30mL Perfusion (Tmax>6.0s) volume: 57mL Mismatch Volume: 42mL Infarction Location:Negative CT perfusion with no evidence for acute infarct. IMPRESSION: 1. Negative CTA with no evidence for emergent large vessel occlusion. 2. Negative CT perfusion with no evidence for acute core infarct or other perfusion abnormality. 3. Atheromatous plaque about the origin of the internal carotid arteries with associated 50% stenosis by NASCET criteria bilaterally. 4. Additional mild for age atheromatous change about the aortic arch and carotid siphons without hemodynamically significant stenosis. These results were communicated to Rory Percy at 11:24 pmon 7/27/2020by text page via the [facility] messaging system. Electronically Signed   By: Jeannine Boga M.D.   On: 02/14/2019 23:38   Ct Head Code Stroke Wo Contrast  Result Date: 02/14/2019 CLINICAL DATA:  Code stroke.  Left-sided weakness. EXAM: CT HEAD WITHOUT CONTRAST TECHNIQUE: Contiguous axial images were obtained from the base of the skull through the vertex without intravenous contrast. COMPARISON:  None. FINDINGS: Brain: No evidence of acute infarction, hemorrhage, hydrocephalus, extra-axial collection or mass lesion/mass effect. Vascular: Negative  for hyperdense vessel Skull: Negative Sinuses/Orbits: Negative Other: None ASPECTS ([place] Stroke Program Early CT Score) - Ganglionic level infarction (caudate, lentiform nuclei, internal  capsule, insula, M1-M3 cortex): 7 - Supraganglionic infarction (M4-M6 cortex): 3 Total score (0-10 with 10 being normal): 10 IMPRESSION: 1. Negative CT head 2. ASPECTS is 10 3. Text message result sent to Dr. Rory Percy. Electronically Signed   By: Franchot Gallo M.D.   On: 02/14/2019 21:57    Microbiology: Recent Results (from the past 240 hour(s))  SARS Coronavirus 2 (CEPHEID - Performed in [place] hospital lab), Hosp Order     Status: None   Collection Time: 02/15/19  5:06 AM   Specimen: Nasopharyngeal Swab  Result Value Ref Range Status   SARS Coronavirus 2 NEGATIVE NEGATIVE Final    Comment: (NOTE) If result is NEGATIVE SARS-CoV-2 target nucleic acids are NOT DETECTED. The SARS-CoV-2 RNA is generally detectable in upper and lower  respiratory specimens during the acute phase of infection. The lowest  concentration of SARS-CoV-2 viral copies this assay can detect is 250  copies / mL. A negative result does not preclude SARS-CoV-2 infection  and should not be used as the sole basis for treatment or other  patient management decisions.  A negative result may occur with  improper specimen collection / handling, submission of specimen other  than nasopharyngeal swab, presence of viral mutation(s) within the  areas targeted by this assay, and inadequate number of viral copies  (<250 copies / mL). A negative result must be combined with clinical  observations, patient history, and epidemiological information. If result is POSITIVE SARS-CoV-2 target nucleic acids are DETECTED. The SARS-CoV-2 RNA is generally detectable in upper and lower  respiratory specimens dur ing the acute phase of infection.  Positive  results are indicative of active infection with SARS-CoV-2.  Clinical  correlation with patient history and other diagnostic information is  necessary to determine patient infection status.  Positive results do  not rule out bacterial infection or co-infection with other viruses. If  result is PRESUMPTIVE POSTIVE SARS-CoV-2 nucleic acids MAY BE PRESENT.   A presumptive positive result was obtained on the submitted specimen  and confirmed on repeat testing.  While 2019 novel coronavirus  (SARS-CoV-2) nucleic acids may be present in the submitted sample  additional confirmatory testing may be necessary for epidemiological  and / or clinical management purposes  to differentiate between  SARS-CoV-2 and other Sarbecovirus currently known to infect humans.  If clinically indicated additional testing with an alternate test  methodology [phone]) is advised. The SARS-CoV-2 RNA is generally  detectable in upper and lower respiratory sp ecimens during the acute  phase of infection. The expected result is Negative. Fact Sheet for Patients:  StrictlyIdeas.no Fact Sheet for Healthcare Providers: BankingDealers.co.za This test is not yet approved or cleared by the [place] FDA and has been authorized for detection and/or diagnosis of SARS-CoV-2 by FDA under an Emergency Use Authorization (EUA).  This EUA will remain in effect (meaning this test can be used) for the duration of the COVID-19 declaration under Section 564(b)(1) of the Act, 21 U.S.C. section 360bbb-3(b)(1), unless the authorization is terminated or revoked sooner. Performed at [facility], [place] [address] [place], [place] [address]      Labs: Basic Metabolic Panel: Recent Labs  Lab 02/14/19 2142 02/14/19 2147 02/15/19 0235 02/16/19 0450  NA 135 135 134* 137  K 3.5 3.6 3.8 3.6  CL 106 105 102 107  CO2 17*  --  20* 21*  GLUCOSE 101* 97 106* 103*  BUN 13 14 11  7*  CREATININE 0.71 0.70 0.70 0.68  CALCIUM 9.0  --  9.3 8.8*   Liver Function Tests: Recent Labs  Lab 02/14/19 2142  AST 42*  ALT 42  ALKPHOS 55  BILITOT 0.4  PROT 7.2  ALBUMIN 3.6   No results for input(s): LIPASE, AMYLASE in the last 168 hours. No results for input(s):  AMMONIA in the last 168 hours. CBC: Recent Labs  Lab 02/14/19 2142 02/14/19 2147 02/16/19 0450  WBC 11.7*  --  7.8  NEUTROABS 8.2*  --   --   HGB 12.8* 13.6 12.1*  HCT 38.6* 40.0 36.2*  MCV 101.8*  --  101.1*  PLT 184  --  169   Cardiac Enzymes: No results for input(s): CKTOTAL, CKMB, CKMBINDEX, TROPONINI in the last 168 hours. BNP: BNP (last 3 results) No results for input(s): BNP in the last 8760 hours.  ProBNP (last 3 results) No results for input(s): PROBNP in the last 8760 hours.  CBG: Recent Labs  Lab 02/14/19 2143 02/15/19 0743 02/16/19 0748  GLUCAP 91 105* 129*       Signed:  Radene Gunning NP [facility] 02/16/2019, 11:49 AM

## 2019-02-16 NOTE — Plan of Care (Signed)
  Problem: Clinical Measurements: Goal: Cardiovascular complication will be avoided 02/16/2019 0013 by Colonel Bald, RN Note: No s/s of cardiovascular complication noted. 02/16/2019 0013 by Colonel Bald, RN Outcome: Progressing

## 2019-02-16 NOTE — TOC Initial Note (Signed)
Transition of Care [facility]) - Initial/Assessment Note    Patient Details  Name: Eddie Taylor MRN: [id number] Date of Birth: 11-May-1956  Transition of [facility]) CM/SW Contact:    Bethena Roys, RN Phone Number: 02/16/2019, 10:53 AM  Clinical Narrative:    Pt presented for syncopal episode. PTA from home with wife. Plan will be to transition home with [facility]. Patient is agreeable to [facility] PT- patient gave me permission to call wife to discuss plan of care. CM did offer choice to wife- she chose [facility]. SOC to begin within 24-48 hours post transition home. Wife will provide transportation home. No further needs from CM at this time.                Expected Discharge Plan: [place] Barriers to Discharge: No Barriers Identified   Patient Goals and CMS Choice Patient states their goals for this hospitalization and ongoing recovery are:: "to return home with wife" CMS Medicare.gov Compare Post Acute Care list provided to:: Patient Represenative (must comment)(referred me to his wife.) Choice offered to / list presented to : Spouse  Expected Discharge Plan and Services Expected Discharge Plan: [place] In-house Referral: NA Discharge Planning Services: CM Consult Post Acute Care Choice: [place] arrangements for the past 2 months: Single Family Home                           HH Arranged: PT [place]: [facility] ([facility] [employer]) Date [place]: 02/16/19 Time [place]: 13 Representative spoke with at [place]: [place]  Prior Living Arrangements/Services Living arrangements for the past 2 months: [place] Lives with:: Spouse Patient language and need for interpreter reviewed:: Yes Do you feel safe going back to the place where you live?: Yes      Need for Family Participation in Patient Care: Yes (Comment) Care giver support system in place?: Yes (comment)    Criminal Activity/Legal Involvement Pertinent to Current Situation/Hospitalization: No - Comment as needed  Activities of Daily Living Home Assistive Devices/Equipment: Eyeglasses ADL Screening (condition at time of admission) Patient's cognitive ability adequate to safely complete daily activities?: Yes Is the patient deaf or have difficulty hearing?: No Does the patient have difficulty seeing, even when wearing glasses/contacts?: No Does the patient have difficulty concentrating, remembering, or making decisions?: No Patient able to express need for assistance with ADLs?: Yes Does the patient have difficulty dressing or bathing?: No Independently performs ADLs?: Yes (appropriate for developmental age) Does the patient have difficulty walking or climbing stairs?: No Weakness of Legs: None Weakness of Arms/Hands: None  Permission Sought/Granted Permission sought to share information with : Family Supports, Investment banker, corporate granted to share info w AGENCY: [facility]- [place]        Emotional Assessment Appearance:: Appears older than stated age, Appears stated age Attitude/Demeanor/Rapport: Engaged Affect (typically observed): Accepting Orientation: : Oriented to Self, Oriented to Place, Oriented to  Time, Oriented to Situation Alcohol / Substance Use: Not Applicable Psych Involvement: No (comment)  Admission diagnosis:  Altered mental status, unspecified altered mental status type [R41.82] Patient Active Problem List   Diagnosis Date Noted  . AMS (altered mental status) 02/15/2019  . Right shoulder pain 02/15/2019  . Left wrist pain 02/15/2019  . Anemia 02/15/2019  . Alcohol dependence ([place]) 02/15/2019  .  H pylori ulcer 03/14/2013  . Abdominal pain, unspecified site 03/13/2013  . Peptic ulcer disease 03/13/2013  . Cholelithiases 03/13/2013  . Melena 03/13/2013   PCP:  Greig Right, MD Pharmacy:   [facility] [place] [address] Phone: [phone] Fax: [phone]     Social Determinants of Health (SDOH) Interventions    Readmission Risk Interventions No flowsheet data found.

## 2019-02-21 DIAGNOSIS — Z6827 Body mass index (BMI) 27.0-27.9, adult: Secondary | ICD-10-CM | POA: Diagnosis not present

## 2019-02-21 DIAGNOSIS — F1010 Alcohol abuse, uncomplicated: Secondary | ICD-10-CM | POA: Diagnosis not present

## 2019-02-21 DIAGNOSIS — R4182 Altered mental status, unspecified: Secondary | ICD-10-CM | POA: Diagnosis not present

## 2019-02-25 DIAGNOSIS — G894 Chronic pain syndrome: Secondary | ICD-10-CM | POA: Diagnosis not present

## 2019-02-25 DIAGNOSIS — Z79899 Other long term (current) drug therapy: Secondary | ICD-10-CM | POA: Diagnosis not present

## 2019-02-25 DIAGNOSIS — M069 Rheumatoid arthritis, unspecified: Secondary | ICD-10-CM | POA: Diagnosis not present

## 2019-02-25 DIAGNOSIS — Z760 Encounter for issue of repeat prescription: Secondary | ICD-10-CM | POA: Diagnosis not present

## 2019-03-15 DIAGNOSIS — Z433 Encounter for attention to colostomy: Secondary | ICD-10-CM | POA: Diagnosis not present

## 2019-03-15 DIAGNOSIS — Z933 Colostomy status: Secondary | ICD-10-CM | POA: Diagnosis not present

## 2019-04-19 DIAGNOSIS — M0579 Rheumatoid arthritis with rheumatoid factor of multiple sites without organ or systems involvement: Secondary | ICD-10-CM | POA: Diagnosis not present

## 2019-04-19 DIAGNOSIS — M1009 Idiopathic gout, multiple sites: Secondary | ICD-10-CM | POA: Diagnosis not present

## 2019-04-19 DIAGNOSIS — M2550 Pain in unspecified joint: Secondary | ICD-10-CM | POA: Diagnosis not present

## 2019-04-19 DIAGNOSIS — Z79899 Other long term (current) drug therapy: Secondary | ICD-10-CM | POA: Diagnosis not present

## 2019-04-19 DIAGNOSIS — M150 Primary generalized (osteo)arthritis: Secondary | ICD-10-CM | POA: Diagnosis not present

## 2019-12-08 ENCOUNTER — Ambulatory Visit (INDEPENDENT_AMBULATORY_CARE_PROVIDER_SITE_OTHER): Payer: Medicare HMO | Admitting: Physician Assistant

## 2019-12-08 ENCOUNTER — Encounter: Payer: Self-pay | Admitting: Physician Assistant

## 2019-12-08 ENCOUNTER — Other Ambulatory Visit: Payer: Self-pay

## 2019-12-08 VITALS — BP 152/97 | HR 67 | Ht 72.0 in | Wt 188.0 lb

## 2019-12-08 DIAGNOSIS — F1010 Alcohol abuse, uncomplicated: Secondary | ICD-10-CM

## 2019-12-08 DIAGNOSIS — Z630 Problems in relationship with spouse or partner: Secondary | ICD-10-CM

## 2019-12-08 DIAGNOSIS — R454 Irritability and anger: Secondary | ICD-10-CM | POA: Diagnosis not present

## 2019-12-08 DIAGNOSIS — G4700 Insomnia, unspecified: Secondary | ICD-10-CM

## 2019-12-08 DIAGNOSIS — F17200 Nicotine dependence, unspecified, uncomplicated: Secondary | ICD-10-CM

## 2019-12-08 NOTE — Progress Notes (Signed)
[facility] MD/PA/NP Initial Note  12/08/2019 6:32 PM Eddie Taylor  MRN:  [id number]  Chief Complaint:  Chief Complaint    Establish Care      HPI: To establish care.  Eddie Taylor presents accompanied by his wife Eddie Taylor, to establish care.  Patient states he is been more angry and irritable and has a short fuse.  His wife says he is angry and hurts her feelings all the time.  I believe this is been going on for about a year, when he stopped taking his psychiatric medications that have been prescribed by his PCP, but maybe worse in the past few months.  No known specific trigger.  His PCP was prescribing Effexor and gabapentin most recently.  Eddie Taylor has also taken Wellbutrin, Elavil, Xanax and Ambien in the past.  Patient states he is able to enjoy things.  Energy and motivation are good.  He does not feel like he needs to be on medications and that is why he stopped taking it last year.  His wife says he does need it and that his mood has been awful since being off of it.  He sleeps well and denies any anxiety, "the beer helps with that."  Not isolating.  Does not cry easily.  No suicidal or homicidal thoughts.  States he drinks alcohol, at least 12-14 beers per evening.  This has been a longstanding issue.  He reports having come from a long line of drinkers and drug users.  He denies ever having increased energy with decreased need for sleep accompanied by impulsivity or risky behavior, increased spending, or increased libido, no grandiosity, no hallucinations, no paranoia.  Visit Diagnosis:    ICD-10-CM   1. Irritability and anger  R45.4   2. Marital stress  Z63.0   3. Alcohol abuse  F10.10   4. Insomnia, unspecified type  G47.00   5. Smoker  F17.200     Past Psychiatric History:   No psych hospitalizations. Had a SI around 64 years old and had a gun and was going to blow his brains out but he didn't do it.  Was on cocaine at the time.   Past medications for mental health diagnoses  include: Wellbutrin, Elavil, Effexor, Ambien, Gabapentin, Xanax  Past Medical History:  Past Medical History:  Diagnosis Date  . Anxiety   . Arthritis   . Depression   . Diverticulitis   . Gastric ulcer   . Hyperlipidemia   . Hypertension   . Syncope and collapse 02/14/2019    Past Surgical History:  Procedure Laterality Date  . COLON SURGERY    . COLOSTOMY    . ESOPHAGOGASTRODUODENOSCOPY N/A 03/13/2013   Procedure: ESOPHAGOGASTRODUODENOSCOPY (EGD);  Surgeon: Lear Ng, MD;  [facility];  Service: Endoscopy;  Laterality: N/A;  . rheumatoid arthritis    . SURGERY SCROTAL / TESTICULAR  2014    Family Psychiatric History: see below  Family History:  Family History  Problem Relation Age of Onset  . Brain cancer Mother   . Hypertension Mother   . Alcohol abuse Mother   . Depression Mother   . Depression Sister   . Heart disease Sister   . Alcohol abuse Sister   . Hypertension Sister   . Drug abuse Brother   . Cirrhosis Maternal Grandfather   . Hypertension Maternal Grandfather   . Hyperlipidemia Maternal Grandfather   . Heart disease Maternal Grandmother   . Diverticulitis Half-Brother   . Depression Half-Brother   . Depression Half-Sister  Social History:  Social History   Socioeconomic History  . Marital status: Married    Spouse name: Eddie Taylor  . Number of children: 0  . Years of education: Not on file  . Highest education level: High school graduate  Occupational History  . Occupation: disability    Comment: Architect  Tobacco Use  . Smoking status: Current Every Day Smoker    Packs/day: 0.75    Types: Cigarettes  . Smokeless tobacco: Never Used  Substance and Sexual Activity  . Alcohol use: Yes    Alcohol/week: 98.0 standard drinks    Types: 98 Cans of beer per week    Comment: Drinks around 14 beers every evening.  . Drug use: Not Currently    Types: Cocaine  . Sexual activity: Not on file    Comment: "now and then"   Other Topics Concern  . Not on file  Social History Narrative   Grew up in [place]. Was raised in [place]. Lived with his Mom and Step dad and mulitple 1/2 siblings.     He is married to Eddie Taylor for 45 years. They didn't grow up together.   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   Step Dad was in [employer] and then did electrical work.   Mom worked in a bar her entire life.      No Eddie forces logistics/support/administrative Taylor.    Had a DUI 30 years ago.   Catholic   No caffeine         Social Determinants of Health   Financial Resource Strain: Low Risk   . Difficulty of Paying Living Expenses: Not very hard  Food Insecurity:   . Worried About Charity fundraiser in the Last Year:   . Arboriculturist in the Last Year:   Transportation Needs:   . Film/video editor (Medical):   Marland Kitchen Lack of Transportation (Non-Medical):   Physical Activity:   . Days of Exercise per Week:   . Minutes of Exercise per Session:   Stress:   . Feeling of Stress :   Social Connections: Unknown  . Frequency of Communication with Friends and Family: Three times a week  . Frequency of Social Gatherings with Friends and Family: Twice a week  . Attends Religious Services: Not on file  . Active Member of Clubs or Organizations: No  . Attends Archivist Meetings: Never  . Marital Status: Married    Allergies:  Allergies  Allergen Reactions  . Morphine And Related     "i go beside myself, i dont know. Its not pretty."   . Prozac [Fluoxetine Hcl] Itching  . Sulfa Antibiotics Other (See Comments)    Doesn't feel well when taking     Metabolic Disorder Labs: No results found for: HGBA1C, MPG No results found for: PROLACTIN Lab Results  Component Value Date   CHOL (H) 11/25/2008    214        ATP III CLASSIFICATION:  <200     mg/dL   Desirable  200-239  mg/dL   Borderline High  >=240    mg/dL   High          TRIG 216 (H) 11/25/2008    HDL 39 (L) 11/25/2008   CHOLHDL 5.5 11/25/2008   VLDL 43 (H) 11/25/2008   LDLCALC (H) 11/25/2008    132        Total Cholesterol/HDL:CHD Risk  Coronary Heart Disease Risk Table                     Men   Women  1/2 Average Risk   3.4   3.3  Average Risk       5.0   4.4  2 X Average Risk   9.6   7.1  3 X Average Risk  23.4   11.0        Use the calculated Patient Ratio above and the CHD Risk Table to determine the patient's CHD Risk.        ATP III CLASSIFICATION (LDL):  <100     mg/dL   Optimal  100-129  mg/dL   Near or Above                    Optimal  130-159  mg/dL   Borderline  160-189  mg/dL   High  >190     mg/dL   Very High     Therapeutic Level Labs: No results found for: LITHIUM No results found for: VALPROATE No components found for:  CBMZ  Current Medications: Current Outpatient Medications  Medication Sig Dispense Refill  . allopurinol (ZYLOPRIM) 300 MG tablet Take 300 mg by mouth daily.    Marland Kitchen HUMIRA 40 MG/0.8ML PSKT Inject 40 mg into the muscle every 14 (fourteen) days.    Marland Kitchen HYDROcodone-acetaminophen (NORCO) 7.5-325 MG per tablet Take 1 tablet by mouth every 6 (six) hours as needed for moderate pain. Takes 10 mg    . lisinopril (ZESTRIL) 5 MG tablet Take 5 mg by mouth daily.    . metoprolol succinate (TOPROL-XL) 50 MG 24 hr tablet Take 50 mg by mouth daily.     . pantoprazole (PROTONIX) 40 MG tablet Take 1 tablet (40 mg total) by mouth 2 (two) times daily with a meal. (Patient taking differently: Take 40 mg by mouth daily. ) 60 tablet 3  . pravastatin (PRAVACHOL) 40 MG tablet Take 40 mg by mouth daily.    Marland Kitchen VASCEPA 1 g CAPS Take 2 g by mouth daily.    Marland Kitchen zolpidem (AMBIEN) 10 MG tablet Take 10 mg by mouth at bedtime.      No current facility-administered medications for this visit.    Medication Side Effects: No   Orders placed this visit:  No orders of the defined types were placed in this encounter.   Psychiatric Specialty Exam:  Review of Systems   Constitutional: Negative.   HENT: Positive for hearing loss.   Eyes: Negative.   Respiratory: Positive for cough.   Cardiovascular: Negative.   Gastrointestinal: Positive for abdominal pain.  Endocrine: Negative.   Genitourinary: Negative.   Musculoskeletal: Positive for arthralgias, back pain and myalgias.       Under the care of pain management  Skin: Negative.   Allergic/Immunologic: Negative.   Neurological: Negative.   Hematological: Negative.   Psychiatric/Behavioral: Positive for agitation.    Blood pressure (!) 152/97, pulse 67, height 6' (1.829 m), weight 188 lb (85.3 kg).Body mass index is 25.5 kg/m.  General Appearance: Casual, Neat, Well Groomed and multiple tattoos  Eye Contact:  Good  Speech:  Clear and Coherent and Normal Rate  Volume:  Increased  Mood:  Irritable  Affect:  Labile  Thought Process:  Goal Directed and Descriptions of Associations: Intact  Orientation:  Full (Time, Place, and Person)  Thought Content: Logical   Suicidal Thoughts:  No  Homicidal Thoughts:  No  Memory:  WNL  Judgement:  Good  Insight:  Good  Psychomotor Activity:  Normal  Concentration:  Concentration: Good and Attention Span: Good  Recall:  Good  Fund of Knowledge: Good  Language: Good  Assets:  Desire for Improvement  ADL's:  Intact  Cognition: WNL  Prognosis:  Good   Screenings:  GAD-7     Office Visit from 12/08/2019 in [facility]  Total GAD-7 Score  10    PHQ2-9     Office Visit from 12/08/2019 in [facility]  PHQ-2 Total Score  0      Receiving Psychotherapy: No   Treatment Plan/Recommendations:  PDMP was reviewed. I spent 65 minutes with the patient and his wife. They really need marriage counseling.  She already sees a counselor herself and states it is because of her husband that she needs it.  She wants him to get counseling and he is open to it.  I gave him Pamala Hurry Vaughn's name and number.  I am not sure if she accepts  his insurance or not but hopefully she can lead him in the right direction if not. We had a very long discussion about psychiatric medications.  It is clear that he does not think he needs to take medications.  His wife thinks that he definitely does.  I think a mood stabilizer would be very beneficial to him.  My first choice is Depakote.  I explained the benefits, risks, side effects, the fact that labs are required if this is given in high doses, or even if he may have elevated LFTs already because of the alcohol use.  He is willing to try it, at a low dose.  I do want to see his recent labs so I can have a baseline for liver function test as well as platelet count.  He is signing a record release form so I can review his labs.  If the values are within normal limits, I will send in a prescription for the Depakote ER 250 mg only 1 p.o. nightly.  I would then want a Depakote and ammonia level within a couple of weeks of starting that even at the low dose.  He and his wife verbalize understanding. We discussed smoking cessation and cutting back on the alcohol.  He is not ready and wants to do only 1 thing at a time, which is understandable.  I think counseling and getting on a mood stabilizer is first choice and hopefully within the next few months we can address the alcohol and/or smoking. Just for the record, he uses [facility]. if I do need to order Depakote and ammonia level I will either mail the orders to him or his wife can come by and pick them up.   Donnal Moat, PA-C

## 2020-01-19 ENCOUNTER — Encounter: Payer: Self-pay | Admitting: Physician Assistant

## 2020-01-19 ENCOUNTER — Ambulatory Visit (INDEPENDENT_AMBULATORY_CARE_PROVIDER_SITE_OTHER): Payer: Medicare HMO | Admitting: Physician Assistant

## 2020-01-19 ENCOUNTER — Other Ambulatory Visit: Payer: Self-pay

## 2020-01-19 DIAGNOSIS — F17200 Nicotine dependence, unspecified, uncomplicated: Secondary | ICD-10-CM

## 2020-01-19 DIAGNOSIS — Z79899 Other long term (current) drug therapy: Secondary | ICD-10-CM

## 2020-01-19 DIAGNOSIS — F1010 Alcohol abuse, uncomplicated: Secondary | ICD-10-CM | POA: Diagnosis not present

## 2020-01-19 DIAGNOSIS — Z630 Problems in relationship with spouse or partner: Secondary | ICD-10-CM | POA: Diagnosis not present

## 2020-01-19 DIAGNOSIS — R454 Irritability and anger: Secondary | ICD-10-CM

## 2020-01-19 DIAGNOSIS — G4700 Insomnia, unspecified: Secondary | ICD-10-CM

## 2020-01-19 MED ORDER — DIVALPROEX SODIUM ER 250 MG PO TB24: 250.0000 mg | ORAL_TABLET | Freq: Every evening | ORAL | 1 refills | Status: DC

## 2020-01-19 NOTE — Progress Notes (Signed)
[facility]  Patient ID: Eddie Taylor,  MRN: [id number]  PCP: [name], MD  Date of Evaluation: 01/19/2020 Time spent:30 minutes  Chief Complaint:  Chief Complaint    Follow-up      HISTORY/CURRENT STATUS:  HPI For 1 month f/u.  We didn't start the Depakote last OV b/c I wanted to review labs. See results below.  Since then, he has started seeing [name] in counseling and feels that that is going really well.  He has seen her twice and she is already helping him out.  He and his wife still have arguments, which he says triggers him to be more agitated and irritable.  States he would be fine if it was not for her.  But he is willing to try the Depakote to see if it will help calm things down.  He has decreased his drinking quite a bit.  He is down from around 1 case of beer per day down to 1-1/2 cases per week now.  States he is trying to back off some, plus he does not have the money to buy it.  Also he is decreasing cigarettes gradually.  He is now down to 8 cigarettes a day.  At the last visit, he reported around 15 cigarettes/day.  Patient denies loss of interest in usual activities and is able to enjoy things.  Denies decreased energy or motivation.  Appetite has not changed.  No extreme sadness, tearfulness, or feelings of hopelessness.  Denies any changes in concentration, making decisions or remembering things.  He does still have trouble sleeping.  The Ambien does help.  Denies suicidal or homicidal thoughts.  Patient denies increased energy with decreased need for sleep, no increased talkativeness, no racing thoughts, no impulsivity or risky behaviors, no increased spending, no increased libido, no grandiosity, no increased irritability or anger, and no hallucinations.  Denies dizziness, syncope, seizures, numbness, tingling, tremor, tics, unsteady gait, slurred speech, confusion. Denies muscle or joint pain, stiffness, or dystonia.  Individual Medical  History/ Review of Systems: Changes? Dyane Dustman down 3 flights of stairs [place] the beach, and broke 3 ribs.  He's better now.  They were staying at a home on the third floor.  He woke up in the middle of the night to go to the bathroom and missed a step or something like that.  That is what caused the fall.  States he was not drunk.  Past medications for mental health diagnoses include: Wellbutrin, Elavil, Effexor, Ambien, Gabapentin, Xanax  Allergies: Morphine and related, Prozac [fluoxetine hcl], and Sulfa antibiotics  Current Medications:  Current Outpatient Medications:  .  allopurinol (ZYLOPRIM) 300 MG tablet, Take 300 mg by mouth daily., Disp: , Rfl:  .  baclofen (LIORESAL) 10 MG tablet, Take 10 mg by mouth 3 (three) times daily., Disp: , Rfl:  .  HUMIRA 40 MG/0.8ML PSKT, Inject 40 mg into the muscle every 14 (fourteen) days., Disp: , Rfl:  .  HYDROcodone-acetaminophen (NORCO) 7.5-325 MG per tablet, Take 1 tablet by mouth every 6 (six) hours as needed for moderate pain. Takes 10 mg, Disp: , Rfl:  .  lisinopril (ZESTRIL) 5 MG tablet, Take 5 mg by mouth daily., Disp: , Rfl:  .  metoprolol succinate (TOPROL-XL) 50 MG 24 hr tablet, Take 50 mg by mouth daily. , Disp: , Rfl:  .  pantoprazole (PROTONIX) 40 MG tablet, Take 1 tablet (40 mg total) by mouth 2 (two) times daily with a meal. (Patient taking  differently: Take 40 mg by mouth daily. ), Disp: 60 tablet, Rfl: 3 .  pravastatin (PRAVACHOL) 40 MG tablet, Take 40 mg by mouth daily., Disp: , Rfl:  .  VASCEPA 1 g CAPS, Take 2 g by mouth daily., Disp: , Rfl:  .  zolpidem (AMBIEN) 10 MG tablet, Take 10 mg by mouth at bedtime. , Disp: , Rfl:  .  divalproex (DEPAKOTE ER) 250 MG 24 hr tablet, Take 1 tablet (250 mg total) by mouth at bedtime., Disp: 30 tablet, Rfl: 1 .  IBU 800 MG tablet, Take 800 mg by mouth 3 (three) times daily., Disp: , Rfl:  Medication Side Effects: none  Family Medical/ Social History: Changes? No  MENTAL HEALTH  EXAM:  There were no vitals taken for this visit.There is no height or weight on file to calculate BMI.  General Appearance: Casual, Neat and Well Groomed  Eye Contact:  Good  Speech:  Clear and Coherent and Normal Rate  Volume:  Normal  Mood:  Euthymic  Affect:  Appropriate  Thought Process:  Goal Directed and Descriptions of Associations: Intact  Orientation:  Full (Time, Place, and Person)  Thought Content: Logical   Suicidal Thoughts:  No  Homicidal Thoughts:  No  Memory:  WNL  Judgement:  Good  Insight:  Good  Psychomotor Activity:  Normal  Concentration:  Concentration: Good  Recall:  Good  Fund of Knowledge: Good  Language: Good  Assets:  Desire for Improvement  ADL's:  Intact  Cognition: WNL  Prognosis:  Good   Lab work on 11/08/2019 shows normal CBC with differential, fasting CMP glucose was 114, BUN and creatinine were within normal limits, LFTs were completely normal, TSH normal at 0.8 vitamin D level was 41.9, hemoglobin A1c 5.4.  DIAGNOSES:    ICD-10-CM   1. Irritability and anger  R45.4   2. Encounter for long-term (current) use of medications  Z79.899 Valproic acid level  3. Marital stress  Z63.0   4. Alcohol abuse  F10.10   5. Smoker  F17.200   6. Insomnia, unspecified type  G47.00     Receiving Psychotherapy: Yes  [name]   RECOMMENDATIONS:  PDMP reviewed. I provided 30 minutes of face-to-face time care with him during this encounter. I am glad to see him cutting back on the alcohol as well as cigarettes. We again discussed the Depakote.  I would recommend starting at a low dose which may be enough to help decrease the irritability and anger.  His LFTs were normal several months ago.  We discussed the benefits, risks and side effects of the Depakote.  He knows to watch for abdominal pain, nausea or vomiting, jaundice, extremely yellow urine, and if anything like that occurs, call immediately or go to the ER. Start Depakote ER 250 mg, 1 p.o.  nightly. Continue Ambien 10 mg, 1 p.o. nightly. Continue therapy with [name]. Return in 4 weeks.  Donnal Moat, PA-C

## 2020-02-03 ENCOUNTER — Other Ambulatory Visit: Payer: Self-pay | Admitting: Physician Assistant

## 2020-02-04 LAB — VALPROIC ACID LEVEL: Valproic Acid Lvl: <4 ug/mL — ABNORMAL LOW (ref 50–100)

## 2020-02-06 NOTE — Progress Notes (Signed)
He is only on 250 mg of Depakote, but please make sure he is taking it.  I would expect the level to be higher.  How is he feeling?  If the anger and irritability are better, we can leave the dose the same, but if not we should increase to 500 mg daily.

## 2020-02-09 ENCOUNTER — Other Ambulatory Visit: Payer: Self-pay | Admitting: Physician Assistant

## 2020-02-09 MED ORDER — DIVALPROEX SODIUM ER 500 MG PO TB24: 500.0000 mg | ORAL_TABLET | Freq: Every day | ORAL | 1 refills | Status: DC

## 2020-02-09 NOTE — Progress Notes (Signed)
Prescription for Depakote ER 500 mg, 1 p.o. nightly was sent into [facility].

## 2020-02-21 ENCOUNTER — Other Ambulatory Visit: Payer: Self-pay

## 2020-02-21 ENCOUNTER — Encounter: Payer: Self-pay | Admitting: Physician Assistant

## 2020-02-21 ENCOUNTER — Ambulatory Visit (INDEPENDENT_AMBULATORY_CARE_PROVIDER_SITE_OTHER): Payer: Medicare HMO | Admitting: Physician Assistant

## 2020-02-21 DIAGNOSIS — Z630 Problems in relationship with spouse or partner: Secondary | ICD-10-CM

## 2020-02-21 DIAGNOSIS — F17200 Nicotine dependence, unspecified, uncomplicated: Secondary | ICD-10-CM

## 2020-02-21 DIAGNOSIS — R454 Irritability and anger: Secondary | ICD-10-CM | POA: Diagnosis not present

## 2020-02-21 DIAGNOSIS — F1010 Alcohol abuse, uncomplicated: Secondary | ICD-10-CM | POA: Diagnosis not present

## 2020-02-21 NOTE — Progress Notes (Signed)
[facility]  Patient ID: Eddie Taylor,  MRN: [id number]  PCP: [name], MD  Date of Evaluation: 02/21/2020 Time spent:30 minutes  Chief Complaint:  Chief Complaint    Follow-up      HISTORY/CURRENT STATUS:  HPI For 1 month f/u.  We increased the Depakote ER on 02/09/20.  He can really tell a difference with anger and irritability.  At 250 mg, he really did not say much difference.  But at the 500 mg, he has been much more able to let things go, and not get so frustrated with his wife or anybody else who provokes him.  "Even my friends have said that I am doing better.  One of them told me that I am more mellow."  States he has only had 6 really bad days since we saw each other last month.  Prior to that, he had 30 bad days every month and more "if there were more days."  States his wife is still a trigger.  "I love her to death but she gets on my nerves."  He denies abdominal pain, jaundice, nausea vomiting, dark yellow urine or other problems.  Patient denies loss of interest in usual activities and is able to enjoy things.  Denies decreased energy or motivation.  Appetite has not changed. Denies any changes in concentration, making decisions or remembering things.  He does still have trouble sleeping.  The Ambien does help.  Denies suicidal or homicidal thoughts.  Patient denies increased energy with decreased need for sleep, no increased talkativeness, no racing thoughts, no impulsivity or risky behaviors, no increased spending, no increased libido, no grandiosity, no paranoia, and no hallucinations.  Denies dizziness, syncope, seizures, numbness, tingling, tremor, tics, unsteady gait, slurred speech, confusion. Denies muscle or joint pain, stiffness, or dystonia.  Individual Medical History/ Review of Systems: Changes? :No   Past medications for mental health diagnoses include: Wellbutrin, Elavil, Effexor, Ambien, Gabapentin, Xanax  Allergies: Morphine and related,  Prozac [fluoxetine hcl], and Sulfa antibiotics  Current Medications:  Current Outpatient Medications:  .  allopurinol (ZYLOPRIM) 300 MG tablet, Take 300 mg by mouth daily., Disp: , Rfl:  .  baclofen (LIORESAL) 10 MG tablet, Take 10 mg by mouth 3 (three) times daily., Disp: , Rfl:  .  divalproex (DEPAKOTE ER) 500 MG 24 hr tablet, Take 1 tablet (500 mg total) by mouth at bedtime., Disp: 30 tablet, Rfl: 1 .  HUMIRA 40 MG/0.8ML PSKT, Inject 40 mg into the muscle every 14 (fourteen) days., Disp: , Rfl:  .  HYDROcodone-acetaminophen (NORCO) 7.5-325 MG per tablet, Take 1 tablet by mouth every 6 (six) hours as needed for moderate pain. Takes 10 mg, Disp: , Rfl:  .  IBU 800 MG tablet, Take 800 mg by mouth 3 (three) times daily., Disp: , Rfl:  .  lisinopril (ZESTRIL) 5 MG tablet, Take 5 mg by mouth daily., Disp: , Rfl:  .  metoprolol succinate (TOPROL-XL) 50 MG 24 hr tablet, Take 50 mg by mouth daily. , Disp: , Rfl:  .  pantoprazole (PROTONIX) 40 MG tablet, Take 1 tablet (40 mg total) by mouth 2 (two) times daily with a meal. (Patient taking differently: Take 40 mg by mouth daily. ), Disp: 60 tablet, Rfl: 3 .  pravastatin (PRAVACHOL) 40 MG tablet, Take 40 mg by mouth daily., Disp: , Rfl:  .  VASCEPA 1 g CAPS, Take 2 g by mouth daily., Disp: , Rfl:  .  zolpidem (AMBIEN) 10 MG tablet, Take 10  mg by mouth at bedtime. , Disp: , Rfl:  Medication Side Effects: none  Family Medical/ Social History: Changes? No  MENTAL HEALTH EXAM:  There were no vitals taken for this visit.There is no height or weight on file to calculate BMI.  General Appearance: Casual, Neat and Well Groomed  Eye Contact:  Good  Speech:  Clear and Coherent and Normal Rate  Volume:  Normal  Mood:  Euthymic  Affect:  Appropriate  Thought Process:  Goal Directed and Descriptions of Associations: Intact  Orientation:  Full (Time, Place, and Person)  Thought Content: Logical   Suicidal Thoughts:  No  Homicidal Thoughts:  No  Memory:   WNL  Judgement:  Good  Insight:  Good  Psychomotor Activity:  Normal  Concentration:  Concentration: Good and Attention Span: Good  Recall:  Good  Fund of Knowledge: Good  Language: Good  Assets:  Desire for Improvement  ADL's:  Intact  Cognition: WNL  Prognosis:  Good     DIAGNOSES:    ICD-10-CM   1. Irritability and anger  R45.4   2. Marital stress  Z63.0   3. Alcohol abuse  F10.10   4. Smoker  F17.200     Receiving Psychotherapy: Yes  [name]   RECOMMENDATIONS:  PDMP reviewed. I provided 30 minutes of face-to-face time care with him during this encounter. I am glad to see that he is doing better! We decided to leave the dose the same right now because he is only been on this dose for 2 weeks.  He will be seeing his PCP next week for routine checkup and I have requested a Depakote level to be drawn when they are drawn his other labs. As far as smoking goes, continue to keep cigarette count at 10 or below.  We briefly discussed options to help with smoking cessation.  He really does want to break the habit, and once his mood is more stabilized on the Depakote, we will focus on smoking cessation.   Continue Depakote ER 500 mg, 1 p.o. nightly. Continue Ambien 10 mg, 1 p.o. nightly. Continue therapy with [name]. Return in 6 weeks.  Donnal Moat, PA-C

## 2020-03-01 ENCOUNTER — Telehealth: Payer: Self-pay | Admitting: Physician Assistant
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
## 2020-03-01 NOTE — Telephone Encounter (Signed)
Tried to reach wife a couple times but no answer and voicemail is full. Will try again

## 2020-03-01 NOTE — Telephone Encounter (Signed)
He has been on Depakote for several months now.  So I do not think that is the cause of it.  If she feels unsafe, she needs to call 911.  And of course, he needs to quit drinking and stop the Ambien.  I recommend he go to behavioral health urgent care

## 2020-04-04 ENCOUNTER — Ambulatory Visit (INDEPENDENT_AMBULATORY_CARE_PROVIDER_SITE_OTHER): Payer: Medicare HMO | Admitting: Physician Assistant

## 2020-04-04 ENCOUNTER — Encounter: Payer: Self-pay | Admitting: Physician Assistant

## 2020-04-04 ENCOUNTER — Other Ambulatory Visit: Payer: Self-pay

## 2020-04-04 DIAGNOSIS — G4700 Insomnia, unspecified: Secondary | ICD-10-CM

## 2020-04-04 DIAGNOSIS — Z630 Problems in relationship with spouse or partner: Secondary | ICD-10-CM | POA: Diagnosis not present

## 2020-04-04 DIAGNOSIS — F17200 Nicotine dependence, unspecified, uncomplicated: Secondary | ICD-10-CM

## 2020-04-04 DIAGNOSIS — F1010 Alcohol abuse, uncomplicated: Secondary | ICD-10-CM

## 2020-04-04 DIAGNOSIS — Z79899 Other long term (current) drug therapy: Secondary | ICD-10-CM | POA: Diagnosis not present

## 2020-04-04 DIAGNOSIS — R454 Irritability and anger: Secondary | ICD-10-CM

## 2020-04-04 MED ORDER — DIVALPROEX SODIUM ER 500 MG PO TB24: 500.0000 mg | ORAL_TABLET | Freq: Every day | ORAL | 0 refills | Status: DC

## 2020-04-04 NOTE — Progress Notes (Signed)
[facility]  Patient ID: Eddie Taylor,  MRN: [id number]  PCP: [name], MD  Date of Evaluation: 04/04/2020 Time spent:20 minutes  Chief Complaint:  Chief Complaint    Follow-up      HISTORY/CURRENT STATUS:  HPI For 1 month f/u.  States doing well. At least 75% better than before he started the Depakote.  Says his wife is still getting on his nerves but she is seeing a counselor now too and he is hoping that will help her.  Patient continues to see [name] for therapy and that is a great help to him.  He is able to enjoy things.  In fact he is working part-time now usually about 4 hours a day.  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  Because he does not have as much time during the day, he has decreased the number of cigarettes that he smokes, and is not drinking as many beers per day.  Energy and motivation are good.  He is not isolating.  Does not cry easily.  Denies suicidal or homicidal thoughts.  He is not sleeping as well as he used to.  His PCP gives him Ambien, and he has asked for it to be increased, but they will not prescribe a higher dose.  I reminded him he is already at the maximum dose of Ambien.  Patient denies increased energy with decreased need for sleep, no increased talkativeness, no racing thoughts, no impulsivity or risky behaviors, no increased spending, no increased libido, no grandiosity, no paranoia, and no hallucinations.  Denies dizziness, syncope, seizures, numbness, tingling, tremor, tics, unsteady gait, slurred speech, confusion. Denies muscle or joint pain, stiffness, or dystonia.  Individual Medical History/ Review of Systems: Changes? :No   Past medications for mental health diagnoses include: Wellbutrin, Elavil, Effexor, Ambien, Gabapentin, Xanax  Allergies: Morphine and related, Prozac [fluoxetine hcl], and Sulfa antibiotics  Current Medications:  Current Outpatient Medications:  .  allopurinol (ZYLOPRIM) 300 MG  tablet, Take 300 mg by mouth daily., Disp: , Rfl:  .  baclofen (LIORESAL) 10 MG tablet, Take 10 mg by mouth 3 (three) times daily., Disp: , Rfl:  .  divalproex (DEPAKOTE ER) 500 MG 24 hr tablet, Take 1 tablet (500 mg total) by mouth at bedtime., Disp: 90 tablet, Rfl: 0 .  HUMIRA 40 MG/0.8ML PSKT, Inject 40 mg into the muscle every 14 (fourteen) days., Disp: , Rfl:  .  HYDROcodone-acetaminophen (NORCO) 7.5-325 MG per tablet, Take 1 tablet by mouth every 6 (six) hours as needed for moderate pain. Takes 10 mg, Disp: , Rfl:  .  IBU 800 MG tablet, Take 800 mg by mouth 3 (three) times daily., Disp: , Rfl:  .  lisinopril (ZESTRIL) 5 MG tablet, Take 5 mg by mouth daily., Disp: , Rfl:  .  metoprolol succinate (TOPROL-XL) 50 MG 24 hr tablet, Take 50 mg by mouth daily. , Disp: , Rfl:  .  pantoprazole (PROTONIX) 40 MG tablet, Take 1 tablet (40 mg total) by mouth 2 (two) times daily with a meal. (Patient taking differently: Take 40 mg by mouth daily. ), Disp: 60 tablet, Rfl: 3 .  pravastatin (PRAVACHOL) 40 MG tablet, Take 40 mg by mouth daily., Disp: , Rfl:  .  VASCEPA 1 g CAPS, Take 2 g by mouth daily., Disp: , Rfl:  .  zolpidem (AMBIEN) 10 MG tablet, Take 10 mg by mouth at bedtime. , Disp: , Rfl:  Medication Side Effects: none  Family Medical/ Social  History: Changes? No  MENTAL HEALTH EXAM:  There were no vitals taken for this visit.There is no height or weight on file to calculate BMI.  General Appearance: Casual, Neat and Well Groomed  Eye Contact:  Good  Speech:  Clear and Coherent and Normal Rate  Volume:  Normal  Mood:  Euthymic  Affect:  Appropriate  Thought Process:  Goal Directed and Descriptions of Associations: Intact  Orientation:  Full (Time, Place, and Person)  Thought Content: Logical   Suicidal Thoughts:  No  Homicidal Thoughts:  No  Memory:  WNL  Judgement:  Good  Insight:  Good  Psychomotor Activity:  Normal  Concentration:  Concentration: Good and Attention Span: Good   Recall:  Good  Fund of Knowledge: Good  Language: Good  Assets:  Desire for Improvement  ADL's:  Intact  Cognition: WNL  Prognosis:  Good     DIAGNOSES:    ICD-10-CM   1. Irritability and anger  R45.4   2. Encounter for long-term (current) use of medications  Z79.899 CBC with Differential/Platelet    Comprehensive metabolic panel    Valproic acid level  3. Marital stress  Z63.0   4. Alcohol abuse  F10.10   5. Smoker  F17.200   6. Insomnia, unspecified type  G47.00     Receiving Psychotherapy: Yes  [name]   RECOMMENDATIONS:  PDMP reviewed. I provided 20 minutes of face-to-face time care with him during this encounter. I am glad to see that he is doing better! Because he is doing very well, we both agree to keep him at the same dose of Depakote.  I am ordering labs because his PCP did not have any labs drawn at his last visit there.   As far as smoking goes, continue to keep cigarette count at 10 or below.  We briefly discussed options to help with smoking cessation.  He really does want to break the habit, and once his mood is more stabilized on the Depakote, we will focus on smoking cessation.   Discussed decreasing alcohol intake. Continue Depakote ER 500 mg, 1 p.o. nightly. Continue Ambien 10 mg, 1 p.o. nightly.  Per another provider. Continue therapy with [name]. Return in 4 weeks.  Donnal Moat, PA-C

## 2020-04-13 ENCOUNTER — Other Ambulatory Visit: Payer: Self-pay | Admitting: Physician Assistant

## 2020-04-15 LAB — COMPREHENSIVE METABOLIC PANEL
ALT: 18 IU/L (ref 0–44)
AST: 26 IU/L (ref 0–40)
Albumin/Globulin Ratio: 1.7 (ref 1.2–2.2)
Albumin: 4 g/dL (ref 3.8–4.8)
Alkaline Phosphatase: 43 IU/L — ABNORMAL LOW (ref 44–121)
BUN/Creatinine Ratio: 15 (ref 10–24)
BUN: 10 mg/dL (ref 8–27)
Bilirubin Total: 0.6 mg/dL (ref 0.0–1.2)
CO2: 22 mmol/L (ref 20–29)
Calcium: 9.1 mg/dL (ref 8.6–10.2)
Chloride: 102 mmol/L (ref 96–106)
Creatinine, Ser: 0.65 mg/dL — ABNORMAL LOW (ref 0.76–1.27)
GFR calc Af Amer: 120 mL/min/{1.73_m2} (ref >59)
GFR calc non Af Amer: 104 mL/min/{1.73_m2} (ref >59)
Globulin, Total: 2.4 g/dL (ref 1.5–4.5)
Glucose: 85 mg/dL (ref 65–99)
Potassium: 3.5 mmol/L (ref 3.5–5.2)
Sodium: 140 mmol/L (ref 134–144)
Total Protein: 6.4 g/dL (ref 6.0–8.5)

## 2020-04-15 LAB — CBC WITH DIFFERENTIAL/PLATELET
Basophils Absolute: 0.1 10*3/uL (ref 0.0–0.2)
Basos: 1 %
EOS (ABSOLUTE): 0.5 10*3/uL — ABNORMAL HIGH (ref 0.0–0.4)
Eos: 8 %
Hematocrit: 31.4 % — ABNORMAL LOW (ref 37.5–51.0)
Hemoglobin: 10.8 g/dL — ABNORMAL LOW (ref 13.0–17.7)
Immature Grans (Abs): 0 10*3/uL (ref 0.0–0.1)
Immature Granulocytes: 0 %
Lymphocytes Absolute: 1.8 10*3/uL (ref 0.7–3.1)
Lymphs: 31 %
MCH: 35.4 pg — ABNORMAL HIGH (ref 26.6–33.0)
MCHC: 34.4 g/dL (ref 31.5–35.7)
MCV: 103 fL — ABNORMAL HIGH (ref 79–97)
Monocytes Absolute: 0.5 10*3/uL (ref 0.1–0.9)
Monocytes: 9 %
Neutrophils Absolute: 3.1 10*3/uL (ref 1.4–7.0)
Neutrophils: 51 %
Platelets: 126 10*3/uL — ABNORMAL LOW (ref 150–450)
RBC: 3.05 x10E6/uL — ABNORMAL LOW (ref 4.14–5.80)
RDW: 13.3 % (ref 11.6–15.4)
WBC: 6 10*3/uL (ref 3.4–10.8)

## 2020-04-15 LAB — VALPROIC ACID LEVEL: Valproic Acid Lvl: 5 ug/mL — ABNORMAL LOW (ref 50–100)

## 2020-04-23 NOTE — Progress Notes (Signed)
Eddie Taylor, Let him know that he is anemic.  He needs to see his PCP as soon as he can for further evaluation and treatment.  In the meantime, he needs to stop drinking because that could be causing this, or at least a contributing factor.  If he will stop drinking, I can send in Librium to help prevent seizures, or he may need to go to inpatient detox.Blood sugar, kidney test, liver test were all normal.Depakote level is still below normal.  Is he taking his medicine as directed?

## 2020-04-27 NOTE — Progress Notes (Signed)
Noted  

## 2020-05-04 ENCOUNTER — Telehealth: Payer: Self-pay | Admitting: Physician Assistant

## 2020-05-04 NOTE — Telephone Encounter (Signed)
Leflunomide

## 2020-05-04 NOTE — Telephone Encounter (Signed)
Pt called and said that he wanted teresa to know the new medicine he is taking for his arthritis prescribed by angela Hacks. The name is Leslunomide 20 mg

## 2020-05-04 NOTE — Telephone Encounter (Signed)
Noted  

## 2020-05-11 ENCOUNTER — Ambulatory Visit (INDEPENDENT_AMBULATORY_CARE_PROVIDER_SITE_OTHER): Payer: Medicare HMO | Admitting: Physician Assistant

## 2020-05-11 ENCOUNTER — Encounter: Payer: Self-pay | Admitting: Physician Assistant

## 2020-05-11 ENCOUNTER — Other Ambulatory Visit: Payer: Self-pay

## 2020-05-11 DIAGNOSIS — G4700 Insomnia, unspecified: Secondary | ICD-10-CM | POA: Diagnosis not present

## 2020-05-11 DIAGNOSIS — F1090 Alcohol use, unspecified, uncomplicated: Secondary | ICD-10-CM

## 2020-05-11 DIAGNOSIS — Z789 Other specified health status: Secondary | ICD-10-CM

## 2020-05-11 DIAGNOSIS — R454 Irritability and anger: Secondary | ICD-10-CM | POA: Diagnosis not present

## 2020-05-11 DIAGNOSIS — F17200 Nicotine dependence, unspecified, uncomplicated: Secondary | ICD-10-CM | POA: Diagnosis not present

## 2020-05-11 DIAGNOSIS — M057A Rheumatoid arthritis with rheumatoid factor of other specified site without organ or systems involvement: Secondary | ICD-10-CM

## 2020-05-11 DIAGNOSIS — Z7289 Other problems related to lifestyle: Secondary | ICD-10-CM

## 2020-05-11 NOTE — Progress Notes (Signed)
[facility]  Patient ID: Eddie Taylor,  MRN: [id number]  PCP: [name], MD  Date of Evaluation: 05/11/2020 Time spent:30 minutes  Chief Complaint:  Chief Complaint    Anxiety; Depression; Insomnia      HISTORY/CURRENT STATUS:  HPI For 1 month f/u.  Eddie Taylor is doing really well!  States he and his wife are getting along better.  They are both trying to quit smoking right now and he has cut his alcohol intake way back.  He is smoking about 8 cigarettes a day now.    He is able to enjoy things.  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  He is glad to have something to do.  Energy and motivation are good.  He is eating well.  Weight has not changed.  He sleeps well most of the time with Ambien, which his PCP prescribes.  No suicidal or homicidal thoughts.  Patient denies increased energy with decreased need for sleep, no increased talkativeness, no racing thoughts, no impulsivity or risky behaviors, no increased spending, no increased libido, and no grandiosity.  He is not nearly as irritable and does not get as agitated as he was before going on the Depakote.  "I know when to pick my battles.  I really even think about what I am going to say before I say it.  If I know it is going to cause an argument, I do not say it."  No paranoia, and no hallucinations.  Depakote level has always been low, as if he is not even taking it.  He brings his bottle in and shows me that there are pills missing from it.  He promises that he is taking it.  Denies dizziness, syncope, seizures, numbness, tingling, tremor, tics, unsteady gait, slurred speech, confusion. Denies muscle or joint pain, stiffness, or dystonia.  Individual Medical History/ Review of Systems: Changes? :Yes  started on a new med Leflunomide, per Rheum, Dr. Gavin Pound.   Past medications for mental health diagnoses include: Wellbutrin, Elavil, Effexor, Ambien, Gabapentin,  Xanax  Allergies: Morphine and related, Prozac [fluoxetine hcl], and Sulfa antibiotics  Current Medications:  Current Outpatient Medications:  .  allopurinol (ZYLOPRIM) 300 MG tablet, Take 300 mg by mouth daily., Disp: , Rfl:  .  baclofen (LIORESAL) 10 MG tablet, Take 10 mg by mouth 3 (three) times daily., Disp: , Rfl:  .  divalproex (DEPAKOTE ER) 500 MG 24 hr tablet, Take 1 tablet (500 mg total) by mouth at bedtime., Disp: 90 tablet, Rfl: 0 .  HUMIRA 40 MG/0.8ML PSKT, Inject 40 mg into the muscle every 14 (fourteen) days., Disp: , Rfl:  .  HYDROcodone-acetaminophen (NORCO) 7.5-325 MG per tablet, Take 1 tablet by mouth every 6 (six) hours as needed for moderate pain. Takes 10 mg, Disp: , Rfl:  .  IBU 800 MG tablet, Take 800 mg by mouth 3 (three) times daily., Disp: , Rfl:  .  lisinopril (ZESTRIL) 5 MG tablet, Take 5 mg by mouth daily., Disp: , Rfl:  .  metoprolol succinate (TOPROL-XL) 50 MG 24 hr tablet, Take 50 mg by mouth daily. , Disp: , Rfl:  .  pantoprazole (PROTONIX) 40 MG tablet, Take 1 tablet (40 mg total) by mouth 2 (two) times daily with a meal. (Patient taking differently: Take 40 mg by mouth daily. ), Disp: 60 tablet, Rfl: 3 .  pravastatin (PRAVACHOL) 40 MG tablet, Take 40 mg by mouth daily., Disp: , Rfl:  .  VASCEPA 1 g CAPS, Take 2 g by mouth daily., Disp: , Rfl:  .  zolpidem (AMBIEN) 10 MG tablet, Take 10 mg by mouth at bedtime. , Disp: , Rfl:  .  leflunomide (ARAVA) 20 MG tablet, , Disp: , Rfl:  Medication Side Effects: none  Family Medical/ Social History: Changes? No  MENTAL HEALTH EXAM:  There were no vitals taken for this visit.There is no height or weight on file to calculate BMI.  General Appearance: Casual, Neat and Well Groomed  Eye Contact:  Good  Speech:  Clear and Coherent and Normal Rate  Volume:  Normal  Mood:  Euthymic  Affect:  Appropriate  Thought Process:  Goal Directed and Descriptions of Associations: Intact  Orientation:  Full (Time, Place, and  Person)  Thought Content: Logical   Suicidal Thoughts:  No  Homicidal Thoughts:  No  Memory:  WNL  Judgement:  Good  Insight:  Good  Psychomotor Activity:  Normal  Concentration:  Concentration: Good and Attention Span: Good  Recall:  Good  Fund of Knowledge: Good  Language: Good  Assets:  Desire for Improvement  ADL's:  Intact  Cognition: WNL  Prognosis:  Good   Most recent pertinent labs: 04/13/2020  CBC with differential shows WBC 6.0, hemoglobin 10.8, hematocrit 31.4, platelet count 126. CMP was normal specifically LFTs were normal except alkaline phosphatase was low at 43. Depakote level was 5  DIAGNOSES:    ICD-10-CM   1. Irritability and anger  R45.4   2. Smoker  F17.200   3. Alcohol use  Z72.89   4. Insomnia, unspecified type  G47.00   5. Rheumatoid arthritis of other site with positive rheumatoid factor ([place])  M05.7A     Receiving Psychotherapy: Yes  [name]   RECOMMENDATIONS:  PDMP reviewed. I provided 30 minutes of face-to-face time care with him during this encounter, including lab review and discussing results with him. I am glad to see that he is doing better! He continues to do well so we agreed to keep him at the same dose of Depakote, even though on his labs it appears he is not taking it.  He is feeling much better and we discussed treating him versus a lab report. Congratulated him on decreasing cigarette count daily.  Keep up the good work!  Brief counseling was given. He is having labs drawn by rheumatology next week.  I will need to follow his platelet count as they are a little below normal.  Chronic alcohol use can cause that but also Depakote.  Just another reason to decrease alcohol, which he understands. Continue Depakote ER 500 mg, 1 p.o. nightly. Continue Ambien 10 mg, 1 p.o. nightly.  Per another provider. Continue therapy with [name]. Return in 2 months.  Donnal Moat, PA-C

## 2020-06-29 ENCOUNTER — Ambulatory Visit (INDEPENDENT_AMBULATORY_CARE_PROVIDER_SITE_OTHER): Payer: Medicare HMO | Admitting: Physician Assistant

## 2020-06-29 ENCOUNTER — Encounter: Payer: Self-pay | Admitting: Physician Assistant

## 2020-06-29 ENCOUNTER — Other Ambulatory Visit: Payer: Self-pay

## 2020-06-29 DIAGNOSIS — F17200 Nicotine dependence, unspecified, uncomplicated: Secondary | ICD-10-CM

## 2020-06-29 DIAGNOSIS — Z7289 Other problems related to lifestyle: Secondary | ICD-10-CM

## 2020-06-29 DIAGNOSIS — M057A Rheumatoid arthritis with rheumatoid factor of other specified site without organ or systems involvement: Secondary | ICD-10-CM

## 2020-06-29 DIAGNOSIS — R454 Irritability and anger: Secondary | ICD-10-CM | POA: Diagnosis not present

## 2020-06-29 DIAGNOSIS — Z789 Other specified health status: Secondary | ICD-10-CM

## 2020-06-29 DIAGNOSIS — Z630 Problems in relationship with spouse or partner: Secondary | ICD-10-CM | POA: Diagnosis not present

## 2020-06-29 MED ORDER — DIVALPROEX SODIUM ER 500 MG PO TB24: 500.0000 mg | ORAL_TABLET | Freq: Every day | ORAL | 1 refills | Status: DC

## 2020-06-29 NOTE — Progress Notes (Signed)
[facility]  Patient ID: Eddie Taylor,  MRN: [id number]  PCP: [name], MD  Date of Evaluation: 06/29/2020 Time spent:20 minutes  Chief Complaint:  Chief Complaint    Follow-up      HISTORY/CURRENT STATUS:  HPI For routine med check.  Kavion is doing really well! He is able to enjoy things. Energy and motivation are good.  He is eating well. Has gained a few pounds since the LOV, has been eating well and could afford to gain a few pounds.  He sleeps well most of the time with Ambien, which his PCP prescribes.  No suicidal or homicidal thoughts.  Patient denies increased energy with decreased need for sleep, no increased talkativeness, no racing thoughts, no impulsivity or risky behaviors, no increased spending, no increased libido, and no grandiosity.  He is not nearly as irritable and does not get as agitated as he was before going on the Depakote.  "I know when to pick my battles.  I really even think about what I am going to say before I say it.  If I know it is going to cause an argument, I do not say it."  No paranoia, and no hallucinations.  Depakote level has always been low, as if he is not even taking it.  Reports that he is taking it as directed.  He does feel some better and his mood is more stable since starting the Depakote.  At least he is more able to tolerate his wife.  Denies dizziness, syncope, seizures, numbness, tingling, tremor, tics, unsteady gait, slurred speech, confusion.  He continues to have chronic joint pain, some days worse than others, due to rheumatoid arthritis.  Individual Medical History/ Review of Systems: Changes? :Yes  Blood pressure medication was increased in the past few days.    Past medications for mental health diagnoses include: Wellbutrin, Elavil, Effexor, Ambien, Gabapentin, Xanax  Allergies: Morphine and related, Prozac [fluoxetine hcl], and Sulfa antibiotics  Current Medications:  Current Outpatient Medications:  .   allopurinol (ZYLOPRIM) 300 MG tablet, Take 300 mg by mouth daily., Disp: , Rfl:  .  baclofen (LIORESAL) 10 MG tablet, Take 10 mg by mouth 3 (three) times daily., Disp: , Rfl:  .  HUMIRA 40 MG/0.8ML PSKT, Inject 40 mg into the muscle every 14 (fourteen) days., Disp: , Rfl:  .  HYDROcodone-acetaminophen (NORCO) 7.5-325 MG per tablet, Take 1 tablet by mouth every 6 (six) hours as needed for moderate pain. Takes 10 mg, Disp: , Rfl:  .  IBU 800 MG tablet, Take 800 mg by mouth 3 (three) times daily., Disp: , Rfl:  .  leflunomide (ARAVA) 20 MG tablet, , Disp: , Rfl:  .  lisinopril (ZESTRIL) 5 MG tablet, Take 5 mg by mouth daily., Disp: , Rfl:  .  metoprolol succinate (TOPROL-XL) 50 MG 24 hr tablet, Take 50 mg by mouth daily. , Disp: , Rfl:  .  pantoprazole (PROTONIX) 40 MG tablet, Take 1 tablet (40 mg total) by mouth 2 (two) times daily with a meal. (Patient taking differently: Take 40 mg by mouth daily.), Disp: 60 tablet, Rfl: 3 .  pravastatin (PRAVACHOL) 40 MG tablet, Take 40 mg by mouth daily., Disp: , Rfl:  .  VASCEPA 1 g CAPS, Take 2 g by mouth daily., Disp: , Rfl:  .  zolpidem (AMBIEN) 10 MG tablet, Take 10 mg by mouth at bedtime., Disp: , Rfl:  .  divalproex (DEPAKOTE ER) 500 MG 24 hr tablet, Take 1 tablet (  500 mg total) by mouth at bedtime., Disp: 90 tablet, Rfl: 1 Medication Side Effects: none  Family Medical/ Social History: Changes? No  MENTAL HEALTH EXAM:  There were no vitals taken for this visit.There is no height or weight on file to calculate BMI.  General Appearance: Casual, Neat and Well Groomed  Eye Contact:  Good  Speech:  Clear and Coherent and Normal Rate  Volume:  Normal  Mood:  Euthymic  Affect:  Appropriate  Thought Process:  Goal Directed and Descriptions of Associations: Intact  Orientation:  Full (Time, Place, and Person)  Thought Content: Logical   Suicidal Thoughts:  No  Homicidal Thoughts:  No  Memory:  WNL  Judgement:  Good  Insight:  Good  Psychomotor  Activity:  Normal  Concentration:  Concentration: Good and Attention Span: Good  Recall:  Good  Fund of Knowledge: Good  Language: Good  Assets:  Desire for Improvement  ADL's:  Intact  Cognition: WNL  Prognosis:  Good   Most recent pertinent labs: 04/13/2020  CBC with differential shows WBC 6.0, hemoglobin 10.8, hematocrit 31.4, platelet count 126. CMP was normal specifically LFTs were normal except alkaline phosphatase was low at 43. Depakote level was 5  DIAGNOSES:    ICD-10-CM   1. Irritability and anger  R45.4   2. Smoker  F17.200   3. Alcohol use  Z72.89   4. Marital stress  Z63.0   5. Rheumatoid arthritis of other site with positive rheumatoid factor ([place])  M05.7A     Receiving Psychotherapy: Yes  [name]   RECOMMENDATIONS:  PDMP reviewed. I provided 20 minutes of face-to face time during this encounter. He continues to do well so we will make no changes.   Recommend cutting back on the alcohol, and continue to try decreasing cigarettes and even stop smoking completely but wait till after the new year when many of his stressors have decreased. He prefers being monitored closely so we will continue to see monthly or at least every 6 weeks. Continue Depakote ER 500 mg, 1 p.o. nightly. Continue Ambien 10 mg, 1 p.o. nightly.  Per another provider. Continue therapy with [name]. Return in 4-6 weeks.  Donnal Moat, PA-C

## 2020-07-02 ENCOUNTER — Telehealth: Payer: Self-pay | Admitting: Physician Assistant

## 2020-07-02 NOTE — Telephone Encounter (Signed)
Pt LM on VM reporting his BP is high and PCP advised to report to you and may be a conflict with meds. Contact # [phone]. Apt 1/27

## 2020-07-03 NOTE — Telephone Encounter (Signed)
I returned the patient's call.  Depakote does not commonly increase blood pressure, he will discuss further with his PCP.

## 2020-08-16 ENCOUNTER — Encounter: Payer: Self-pay | Admitting: Physician Assistant

## 2020-08-16 ENCOUNTER — Ambulatory Visit (INDEPENDENT_AMBULATORY_CARE_PROVIDER_SITE_OTHER): Payer: Medicare HMO | Admitting: Physician Assistant

## 2020-08-16 ENCOUNTER — Other Ambulatory Visit: Payer: Self-pay

## 2020-08-16 DIAGNOSIS — F17200 Nicotine dependence, unspecified, uncomplicated: Secondary | ICD-10-CM

## 2020-08-16 DIAGNOSIS — Z630 Problems in relationship with spouse or partner: Secondary | ICD-10-CM

## 2020-08-16 DIAGNOSIS — G4700 Insomnia, unspecified: Secondary | ICD-10-CM | POA: Diagnosis not present

## 2020-08-16 DIAGNOSIS — Z79899 Other long term (current) drug therapy: Secondary | ICD-10-CM | POA: Diagnosis not present

## 2020-08-16 DIAGNOSIS — Z7289 Other problems related to lifestyle: Secondary | ICD-10-CM

## 2020-08-16 DIAGNOSIS — M057A Rheumatoid arthritis with rheumatoid factor of other specified site without organ or systems involvement: Secondary | ICD-10-CM

## 2020-08-16 DIAGNOSIS — R454 Irritability and anger: Secondary | ICD-10-CM

## 2020-08-16 DIAGNOSIS — Z789 Other specified health status: Secondary | ICD-10-CM

## 2020-08-16 MED ORDER — DIVALPROEX SODIUM ER 500 MG PO TB24: 1000.0000 mg | ORAL_TABLET | Freq: Every day | ORAL | 1 refills | Status: DC

## 2020-08-16 NOTE — Progress Notes (Signed)
[facility]  Patient ID: Eddie Taylor,  MRN: [id number]  PCP: [name], MD  Date of Evaluation: 08/16/2020 Time spent:40 minutes  Chief Complaint:  Chief Complaint    Anxiety; Depression; Insomnia      HISTORY/CURRENT STATUS:  HPI For routine med check.  Brother-in-law died suddenly 2020-07-29, his father-in-law died on Christmas about 20 years ago.  So the holidays are always hard for his wife.  Now it will be worse.  Due to this, along with other things, he has been a bit more irritable lately.  "It is not as bad as it was before I went on Depakote but I can feel it coming back and I am tired of acting that way.  He is not drinking as much as he did, it is down to about 3 beers a day.  His wife does not give him any money to buy more.  Same for cigarettes.  He has cut way back.  He is able to enjoy things. Energy and motivation are good.  He still having a lot of trouble sleeping and his PCP switched from Ambien to [place] recently. Not really helping much, but he'll discuss that with her.  Appetite is decreased and he has lost about 15 pounds in the past few months.  Does have discomfort in his abdomen, "may be due to the ulcer".  he has an appointment to have an endoscopy in the next few weeks. No suicidal or homicidal thoughts.  Denies dizziness, syncope, seizures, numbness, tingling, tremor, tics, unsteady gait, slurred speech, confusion.  He continues to have chronic joint pain, some days worse than others, due to rheumatoid arthritis.  Individual Medical History/ Review of Systems: Changes? :Yes  Feb 3rd has endoscopy.   Past medications for mental health diagnoses include: Wellbutrin, Elavil, Effexor, Ambien, Gabapentin, Xanax  Allergies: Morphine and related, Prozac [fluoxetine hcl], and Sulfa antibiotics  Current Medications:  Current Outpatient Medications:  .  allopurinol (ZYLOPRIM) 300 MG tablet, Take 300 mg by mouth daily., Disp: , Rfl:  .  baclofen  (LIORESAL) 10 MG tablet, Take 10 mg by mouth 3 (three) times daily., Disp: , Rfl:  .  HUMIRA 40 MG/0.8ML PSKT, Inject 40 mg into the muscle every 14 (fourteen) days., Disp: , Rfl:  .  hydrochlorothiazide (HYDRODIURIL) 25 MG tablet, 1 tablet in the morning, Disp: , Rfl:  .  HYDROcodone-acetaminophen (NORCO) 7.5-325 MG per tablet, Take 1 tablet by mouth every 6 (six) hours as needed for moderate pain. Takes 10 mg, Disp: , Rfl:  .  IBU 800 MG tablet, Take 800 mg by mouth 3 (three) times daily., Disp: , Rfl:  .  leflunomide (ARAVA) 20 MG tablet, , Disp: , Rfl:  .  lisinopril (ZESTRIL) 5 MG tablet, Take 5 mg by mouth daily., Disp: , Rfl:  .  metoprolol succinate (TOPROL-XL) 50 MG 24 hr tablet, Take 50 mg by mouth daily. , Disp: , Rfl:  .  pantoprazole (PROTONIX) 40 MG tablet, Take 1 tablet (40 mg total) by mouth 2 (two) times daily with a meal. (Patient taking differently: Take 40 mg by mouth daily.), Disp: 60 tablet, Rfl: 3 .  pravastatin (PRAVACHOL) 40 MG tablet, Take 40 mg by mouth daily., Disp: , Rfl:  .  VASCEPA 1 g CAPS, Take 2 g by mouth daily., Disp: , Rfl:  .  divalproex (DEPAKOTE ER) 500 MG 24 hr tablet, Take 2 tablets (1,000 mg total) by mouth at bedtime., Disp: 180 tablet, Rfl: 1 Medication  Side Effects: none  Family Medical/ Social History: Changes? No  MENTAL HEALTH EXAM:  There were no vitals taken for this visit.There is no height or weight on file to calculate BMI.  General Appearance: Casual, Neat and Well Groomed  Eye Contact:  Good  Speech:  Clear and Coherent and Normal Rate  Volume:  Normal  Mood:  Euthymic  Affect:  Appropriate  Thought Process:  Goal Directed and Descriptions of Associations: Intact  Orientation:  Full (Time, Place, and Person)  Thought Content: Logical   Suicidal Thoughts:  No  Homicidal Thoughts:  No  Memory:  WNL  Judgement:  Good  Insight:  Good  Psychomotor Activity:  Normal  Concentration:  Concentration: Good and Attention Span: Good   Recall:  Good  Fund of Knowledge: Good  Language: Good  Assets:  Desire for Improvement  ADL's:  Intact  Cognition: WNL  Prognosis:  Good   Most recent pertinent labs: 04/13/2020  CBC with differential shows WBC 6.0, hemoglobin 10.8, hematocrit 31.4, platelet count 126. CMP was normal specifically LFTs were normal except alkaline phosphatase was low at 43. Depakote level was 5  DIAGNOSES:    ICD-10-CM   1. Irritability and anger  R45.4   2. Encounter for long-term (current) use of medications  Z79.899 Valproic acid level  3. Rheumatoid arthritis of other site with positive rheumatoid factor ([facility])  M05.7A   4. Insomnia, unspecified type  G47.00   5. Smoker  F17.200   6. Marital stress  Z63.0   7. Alcohol use  Z72.89     Receiving Psychotherapy: Yes  [name]   RECOMMENDATIONS:  PDMP reviewed. I provided 40 minutes of face-to face time during this encounter, discussing the decrease in alcohol and nicotine.  Congratulations on cutting back!  We also discussed his labs done 4 months ago, LFTs were perfect so no concerns of liver failure at that time. I recommend increasing the Depakote as he is on a low dose anyway.  This will help with the easy irritability and anger.  He would like to try it. Increase Depakote ER 500 mg, 2 po qhs. Continue Lunesta 3 mg po qhs prn, per PCP.  Get Depakote level drawn in 7 to 10 days after increasing the dose. Continue therapy with [name]. Return in 4-6 weeks.  Donnal Moat, PA-C

## 2020-08-18 ENCOUNTER — Emergency Department (HOSPITAL_COMMUNITY): Payer: Medicare HMO

## 2020-08-18 ENCOUNTER — Other Ambulatory Visit: Payer: Self-pay

## 2020-08-18 ENCOUNTER — Inpatient Hospital Stay (HOSPITAL_COMMUNITY)
Admission: EM | Admit: 2020-08-18 | Discharge: 2020-08-22 | DRG: 683 | Disposition: A | Discharge status: Home / Self Care | Payer: Medicare HMO | Attending: Internal Medicine | Admitting: Internal Medicine

## 2020-08-18 ENCOUNTER — Encounter (HOSPITAL_COMMUNITY): Payer: Self-pay | Admitting: Emergency Medicine

## 2020-08-18 DIAGNOSIS — M069 Rheumatoid arthritis, unspecified: Secondary | ICD-10-CM

## 2020-08-18 DIAGNOSIS — D539 Nutritional anemia, unspecified: Secondary | ICD-10-CM | POA: Diagnosis not present

## 2020-08-18 DIAGNOSIS — D696 Thrombocytopenia, unspecified: Secondary | ICD-10-CM | POA: Diagnosis not present

## 2020-08-18 DIAGNOSIS — I700 Atherosclerosis of aorta: Secondary | ICD-10-CM | POA: Diagnosis present

## 2020-08-18 DIAGNOSIS — E785 Hyperlipidemia, unspecified: Secondary | ICD-10-CM | POA: Diagnosis present

## 2020-08-18 DIAGNOSIS — G4700 Insomnia, unspecified: Secondary | ICD-10-CM | POA: Diagnosis present

## 2020-08-18 DIAGNOSIS — Z8711 Personal history of peptic ulcer disease: Secondary | ICD-10-CM | POA: Diagnosis not present

## 2020-08-18 DIAGNOSIS — E872 Acidosis: Secondary | ICD-10-CM | POA: Diagnosis present

## 2020-08-18 DIAGNOSIS — E860 Dehydration: Secondary | ICD-10-CM | POA: Diagnosis present

## 2020-08-18 DIAGNOSIS — R338 Other retention of urine: Secondary | ICD-10-CM | POA: Diagnosis not present

## 2020-08-18 DIAGNOSIS — Z83438 Family history of other disorder of lipoprotein metabolism and other lipidemia: Secondary | ICD-10-CM

## 2020-08-18 DIAGNOSIS — I959 Hypotension, unspecified: Secondary | ICD-10-CM | POA: Diagnosis present

## 2020-08-18 DIAGNOSIS — E861 Hypovolemia: Secondary | ICD-10-CM | POA: Diagnosis present

## 2020-08-18 DIAGNOSIS — F919 Conduct disorder, unspecified: Secondary | ICD-10-CM | POA: Diagnosis present

## 2020-08-18 DIAGNOSIS — K279 Peptic ulcer, site unspecified, unspecified as acute or chronic, without hemorrhage or perforation: Secondary | ICD-10-CM | POA: Diagnosis present

## 2020-08-18 DIAGNOSIS — F329 Major depressive disorder, single episode, unspecified: Secondary | ICD-10-CM | POA: Diagnosis present

## 2020-08-18 DIAGNOSIS — F17210 Nicotine dependence, cigarettes, uncomplicated: Secondary | ICD-10-CM | POA: Diagnosis present

## 2020-08-18 DIAGNOSIS — I248 Other forms of acute ischemic heart disease: Secondary | ICD-10-CM | POA: Diagnosis present

## 2020-08-18 DIAGNOSIS — K435 Parastomal hernia without obstruction or  gangrene: Secondary | ICD-10-CM | POA: Diagnosis present

## 2020-08-18 DIAGNOSIS — Z20822 Contact with and (suspected) exposure to covid-19: Secondary | ICD-10-CM | POA: Diagnosis present

## 2020-08-18 DIAGNOSIS — D61818 Other pancytopenia: Secondary | ICD-10-CM | POA: Diagnosis present

## 2020-08-18 DIAGNOSIS — H538 Other visual disturbances: Secondary | ICD-10-CM | POA: Diagnosis present

## 2020-08-18 DIAGNOSIS — R008 Other abnormalities of heart beat: Secondary | ICD-10-CM | POA: Diagnosis present

## 2020-08-18 DIAGNOSIS — Z818 Family history of other mental and behavioral disorders: Secondary | ICD-10-CM

## 2020-08-18 DIAGNOSIS — K219 Gastro-esophageal reflux disease without esophagitis: Secondary | ICD-10-CM | POA: Diagnosis present

## 2020-08-18 DIAGNOSIS — E875 Hyperkalemia: Secondary | ICD-10-CM | POA: Diagnosis not present

## 2020-08-18 DIAGNOSIS — Z932 Ileostomy status: Secondary | ICD-10-CM

## 2020-08-18 DIAGNOSIS — N179 Acute kidney failure, unspecified: Principal | ICD-10-CM | POA: Diagnosis present

## 2020-08-18 DIAGNOSIS — Z888 Allergy status to other drugs, medicaments and biological substances status: Secondary | ICD-10-CM

## 2020-08-18 DIAGNOSIS — R112 Nausea with vomiting, unspecified: Secondary | ICD-10-CM | POA: Diagnosis present

## 2020-08-18 DIAGNOSIS — A419 Sepsis, unspecified organism: Secondary | ICD-10-CM | POA: Diagnosis present

## 2020-08-18 DIAGNOSIS — F32A Depression, unspecified: Secondary | ICD-10-CM | POA: Diagnosis present

## 2020-08-18 DIAGNOSIS — K529 Noninfective gastroenteritis and colitis, unspecified: Secondary | ICD-10-CM | POA: Diagnosis present

## 2020-08-18 DIAGNOSIS — E871 Hypo-osmolality and hyponatremia: Secondary | ICD-10-CM | POA: Diagnosis present

## 2020-08-18 DIAGNOSIS — E8729 Other acidosis: Secondary | ICD-10-CM | POA: Diagnosis present

## 2020-08-18 DIAGNOSIS — Z882 Allergy status to sulfonamides status: Secondary | ICD-10-CM

## 2020-08-18 DIAGNOSIS — I10 Essential (primary) hypertension: Secondary | ICD-10-CM | POA: Diagnosis present

## 2020-08-18 DIAGNOSIS — Z8249 Family history of ischemic heart disease and other diseases of the circulatory system: Secondary | ICD-10-CM

## 2020-08-18 DIAGNOSIS — Z720 Tobacco use: Secondary | ICD-10-CM | POA: Diagnosis present

## 2020-08-18 DIAGNOSIS — F419 Anxiety disorder, unspecified: Secondary | ICD-10-CM | POA: Diagnosis present

## 2020-08-18 DIAGNOSIS — R6510 Systemic inflammatory response syndrome (SIRS) of non-infectious origin without acute organ dysfunction: Secondary | ICD-10-CM | POA: Diagnosis present

## 2020-08-18 DIAGNOSIS — R251 Tremor, unspecified: Secondary | ICD-10-CM | POA: Diagnosis present

## 2020-08-18 DIAGNOSIS — I498 Other specified cardiac arrhythmias: Secondary | ICD-10-CM | POA: Diagnosis present

## 2020-08-18 DIAGNOSIS — I2489 Other forms of acute ischemic heart disease: Secondary | ICD-10-CM | POA: Diagnosis present

## 2020-08-18 DIAGNOSIS — Z8619 Personal history of other infectious and parasitic diseases: Secondary | ICD-10-CM

## 2020-08-18 DIAGNOSIS — Z933 Colostomy status: Secondary | ICD-10-CM

## 2020-08-18 DIAGNOSIS — Z885 Allergy status to narcotic agent status: Secondary | ICD-10-CM

## 2020-08-18 DIAGNOSIS — Z79899 Other long term (current) drug therapy: Secondary | ICD-10-CM

## 2020-08-18 LAB — CBC
HCT: 35.5 % — ABNORMAL LOW (ref 39.0–52.0)
Hemoglobin: 12.5 g/dL — ABNORMAL LOW (ref 13.0–17.0)
MCH: 34.9 pg — ABNORMAL HIGH (ref 26.0–34.0)
MCHC: 35.2 g/dL (ref 30.0–36.0)
MCV: 99.2 fL (ref 80.0–100.0)
Platelets: 147 10*3/uL — ABNORMAL LOW (ref 150–400)
RBC: 3.58 MIL/uL — ABNORMAL LOW (ref 4.22–5.81)
RDW: 13.5 % (ref 11.5–15.5)
WBC: 12.3 10*3/uL — ABNORMAL HIGH (ref 4.0–10.5)
nRBC: 0 % (ref 0.0–0.2)

## 2020-08-18 LAB — URINALYSIS, ROUTINE W REFLEX MICROSCOPIC
Bilirubin Urine: NEGATIVE
Glucose, UA: NEGATIVE mg/dL
Ketones, ur: NEGATIVE mg/dL
Leukocytes,Ua: NEGATIVE
Nitrite: NEGATIVE
Protein, ur: 30 mg/dL — AB
Specific Gravity, Urine: 1.011 (ref 1.005–1.030)
pH: 5 (ref 5.0–8.0)

## 2020-08-18 LAB — I-STAT VENOUS BLOOD GAS, ED
Acid-base deficit: 13 mmol/L — ABNORMAL HIGH (ref 0.0–2.0)
Bicarbonate: 14.4 mmol/L — ABNORMAL LOW (ref 20.0–28.0)
Calcium, Ion: 0.88 mmol/L — CL (ref 1.15–1.40)
HCT: 29 % — ABNORMAL LOW (ref 39.0–52.0)
Hemoglobin: 9.9 g/dL — ABNORMAL LOW (ref 13.0–17.0)
O2 Saturation: 98 %
Potassium: 4.4 mmol/L (ref 3.5–5.1)
Sodium: 134 mmol/L — ABNORMAL LOW (ref 135–145)
TCO2: 16 mmol/L — ABNORMAL LOW (ref 22–32)
pCO2, Ven: 36.9 mmHg — ABNORMAL LOW (ref 44.0–60.0)
pH, Ven: 7.2 — ABNORMAL LOW (ref 7.250–7.430)
pO2, Ven: 129 mmHg — ABNORMAL HIGH (ref 32.0–45.0)

## 2020-08-18 LAB — COMPREHENSIVE METABOLIC PANEL
ALT: 15 U/L (ref 0–44)
AST: 18 U/L (ref 15–41)
Albumin: 4 g/dL (ref 3.5–5.0)
Alkaline Phosphatase: 40 U/L (ref 38–126)
Anion gap: 16 — ABNORMAL HIGH (ref 5–15)
BUN: 82 mg/dL — ABNORMAL HIGH (ref 8–23)
CO2: 13 mmol/L — ABNORMAL LOW (ref 22–32)
Calcium: 7.3 mg/dL — ABNORMAL LOW (ref 8.9–10.3)
Chloride: 100 mmol/L (ref 98–111)
Creatinine, Ser: 5.68 mg/dL — ABNORMAL HIGH (ref 0.61–1.24)
GFR, Estimated: 11 mL/min — ABNORMAL LOW (ref >60)
Glucose, Bld: 100 mg/dL — ABNORMAL HIGH (ref 70–99)
Potassium: 6 mmol/L — ABNORMAL HIGH (ref 3.5–5.1)
Sodium: 129 mmol/L — ABNORMAL LOW (ref 135–145)
Total Bilirubin: 0.9 mg/dL (ref 0.3–1.2)
Total Protein: 7.8 g/dL (ref 6.5–8.1)

## 2020-08-18 LAB — BASIC METABOLIC PANEL
Anion gap: 18 — ABNORMAL HIGH (ref 5–15)
Anion gap: 12 (ref 5–15)
BUN: 82 mg/dL — ABNORMAL HIGH (ref 8–23)
BUN: 68 mg/dL — ABNORMAL HIGH (ref 8–23)
CO2: 13 mmol/L — ABNORMAL LOW (ref 22–32)
CO2: 13 mmol/L — ABNORMAL LOW (ref 22–32)
Calcium: 7.6 mg/dL — ABNORMAL LOW (ref 8.9–10.3)
Calcium: 6.1 mg/dL — CL (ref 8.9–10.3)
Chloride: 99 mmol/L (ref 98–111)
Chloride: 109 mmol/L (ref 98–111)
Creatinine, Ser: 5.85 mg/dL — ABNORMAL HIGH (ref 0.61–1.24)
Creatinine, Ser: 3.83 mg/dL — ABNORMAL HIGH (ref 0.61–1.24)
GFR, Estimated: 10 mL/min — ABNORMAL LOW (ref >60)
GFR, Estimated: 17 mL/min — ABNORMAL LOW (ref >60)
Glucose, Bld: 119 mg/dL — ABNORMAL HIGH (ref 70–99)
Glucose, Bld: 85 mg/dL (ref 70–99)
Potassium: 5 mmol/L (ref 3.5–5.1)
Potassium: 4.5 mmol/L (ref 3.5–5.1)
Sodium: 130 mmol/L — ABNORMAL LOW (ref 135–145)
Sodium: 134 mmol/L — ABNORMAL LOW (ref 135–145)

## 2020-08-18 LAB — LACTIC ACID, PLASMA
Lactic Acid, Venous: 1.3 mmol/L (ref 0.5–1.9)
Lactic Acid, Venous: 1.1 mmol/L (ref 0.5–1.9)

## 2020-08-18 LAB — PROTIME-INR
INR: 1.2 (ref 0.8–1.2)
Prothrombin Time: 14.6 seconds (ref 11.4–15.2)

## 2020-08-18 LAB — SARS CORONAVIRUS 2 BY RT PCR (HOSPITAL ORDER, PERFORMED IN ~~LOC~~ HOSPITAL LAB): SARS Coronavirus 2: NEGATIVE

## 2020-08-18 LAB — APTT: aPTT: 32 seconds (ref 24–36)

## 2020-08-18 LAB — LIPASE, BLOOD: Lipase: 26 U/L (ref 11–51)

## 2020-08-18 LAB — VALPROIC ACID LEVEL: Valproic Acid Lvl: 18 ug/mL — ABNORMAL LOW (ref 50.0–100.0)

## 2020-08-18 IMAGING — US US RENAL
1 series · 14 of 23 positions shown · non-contrast
Comparison: Abdominal ultrasound [DATE]

CLINICAL DATA: Generalized weakness.  Question urinary retention.

EXAM:
RENAL / URINARY TRACT ULTRASOUND COMPLETE

[Series 1: us renal · 14 of 23 slices shown]
[im 1/23]
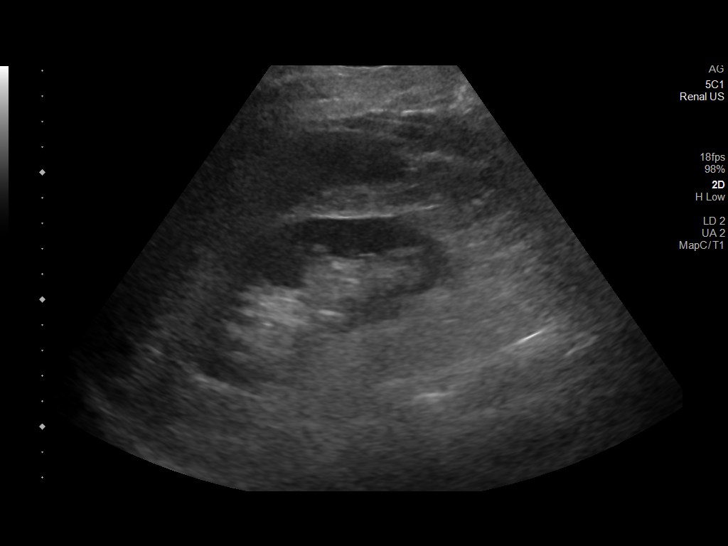
[im 3/23]
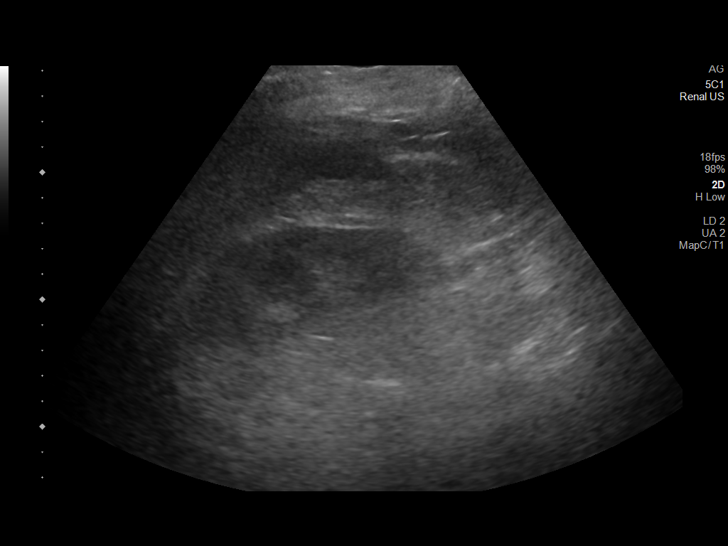
[im 5/23]
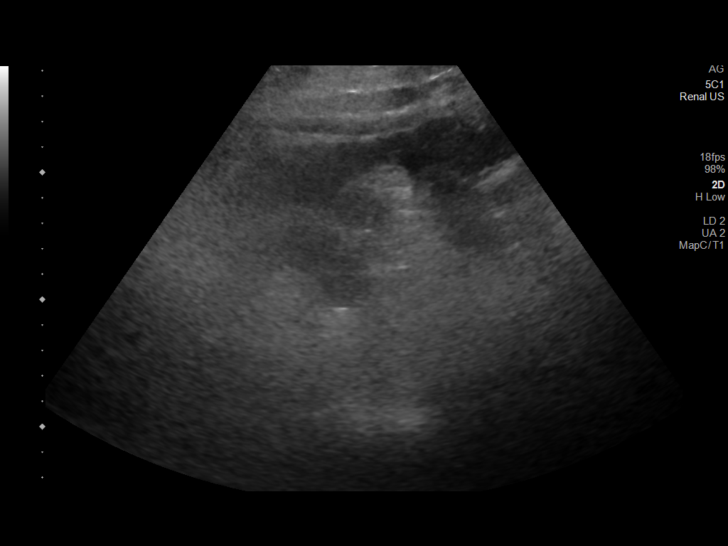
[im 6/23]
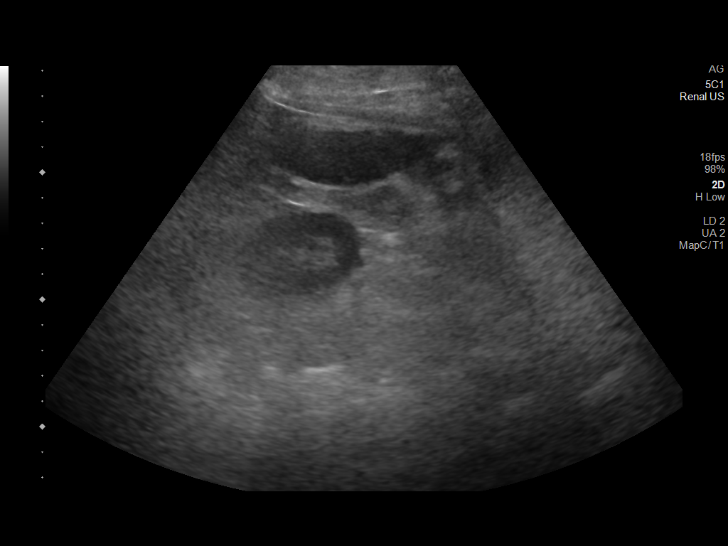
[im 8/23]
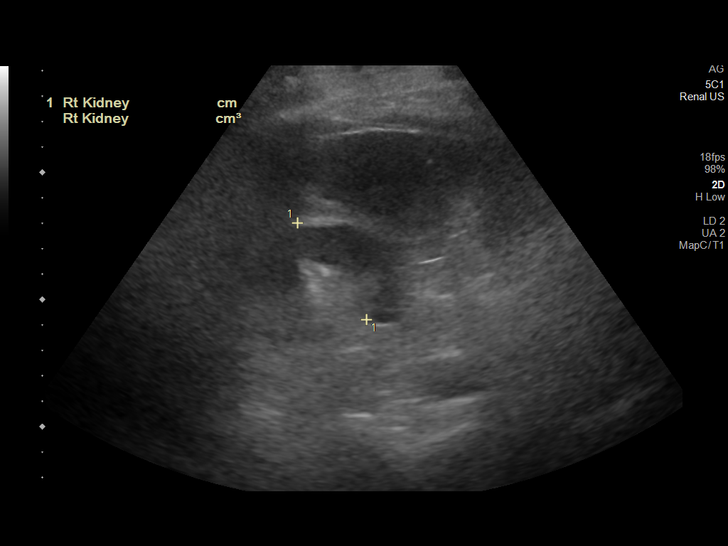
[im 10/23]
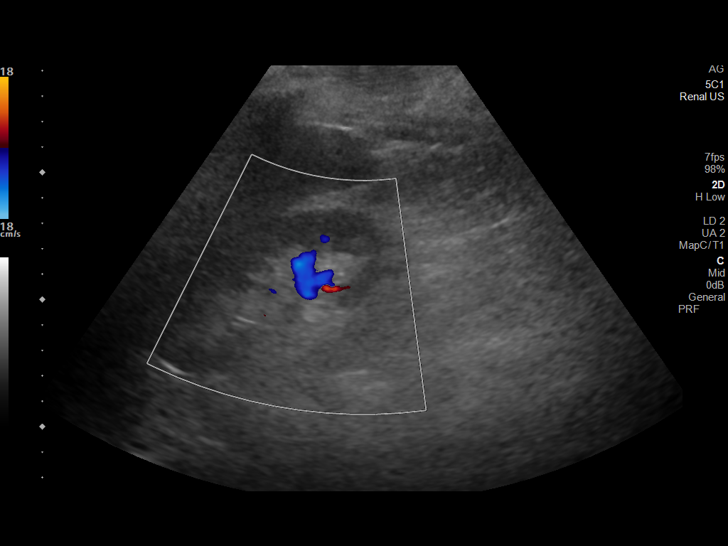
[im 11/23]
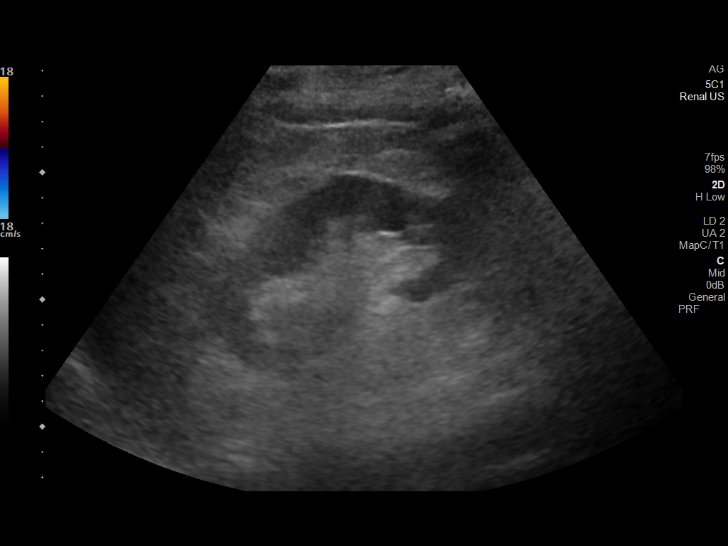
[im 13/23]
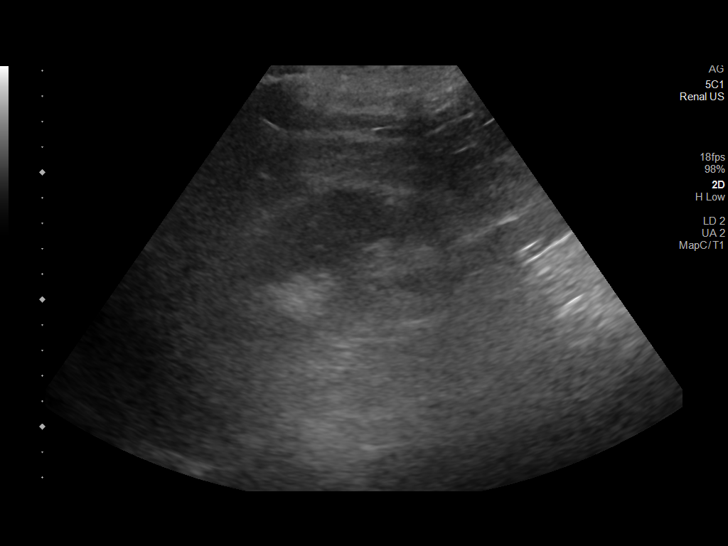
[im 14/23]
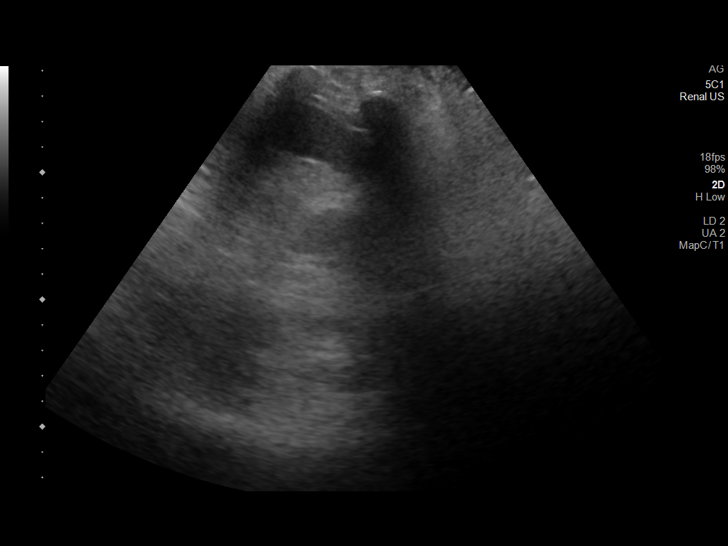
[im 16/23]
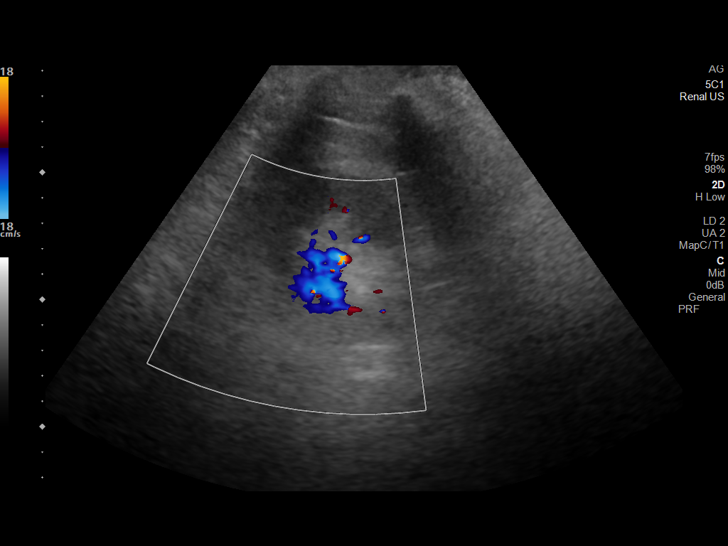
[im 18/23]
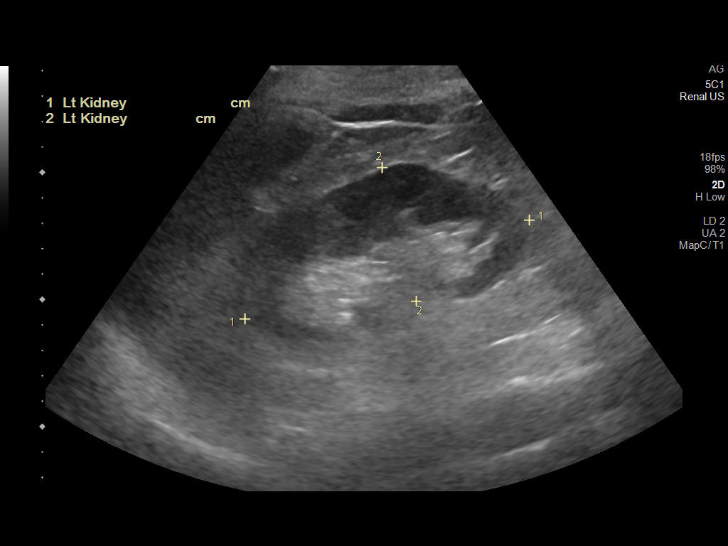
[im 19/23]
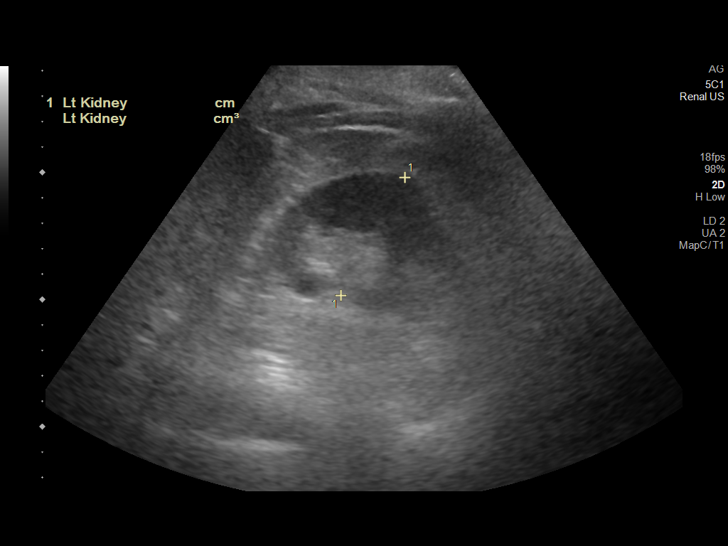
[im 21/23]
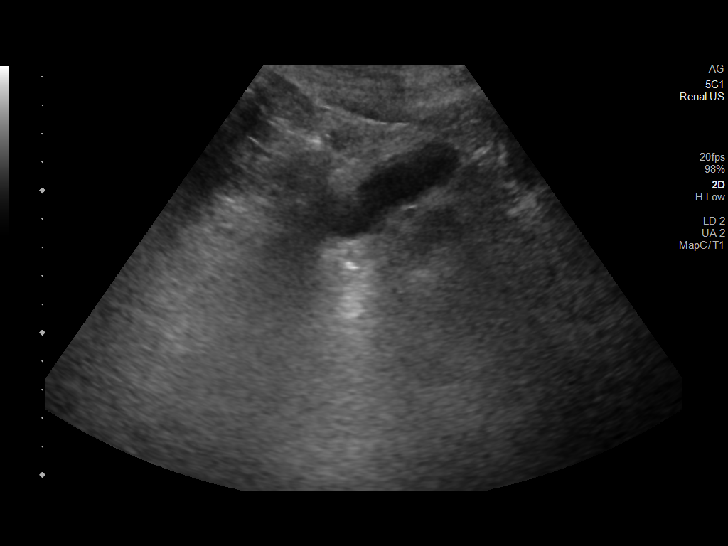
[im 23/23]
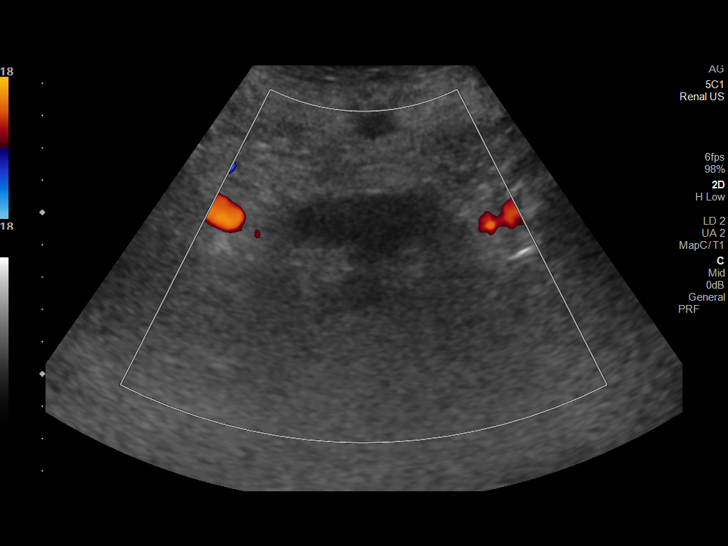

[14 of 23 positions shown; findings below may reference images not displayed]

FINDINGS: Right Kidney:

Renal measurements: 10.9 x 5.0 x 4.7 cm = volume: 132 mL.
Echogenicity within normal limits. No mass or hydronephrosis
visualized.

Left Kidney:

Renal measurements: 11.8 x 5.4 x 5.3 cm = volume: 178 mL.
Echogenicity within normal limits. No mass or hydronephrosis
visualized.

Bladder:

Only minimally distended. No wall thickening for degree of
distension.

Other:

None.
IMPRESSION: 1. Unremarkable sonographic appearance of the kidneys.
2. Urinary bladder is only minimally distended.

## 2020-08-18 IMAGING — DX DG CHEST 1V PORT
1 series · 1 of 1 positions shown · non-contrast
Comparison: [DATE]

CLINICAL DATA: Weakness and vomiting.  Possible sepsis.

EXAM:
PORTABLE CHEST 1 VIEW

[chest ap]
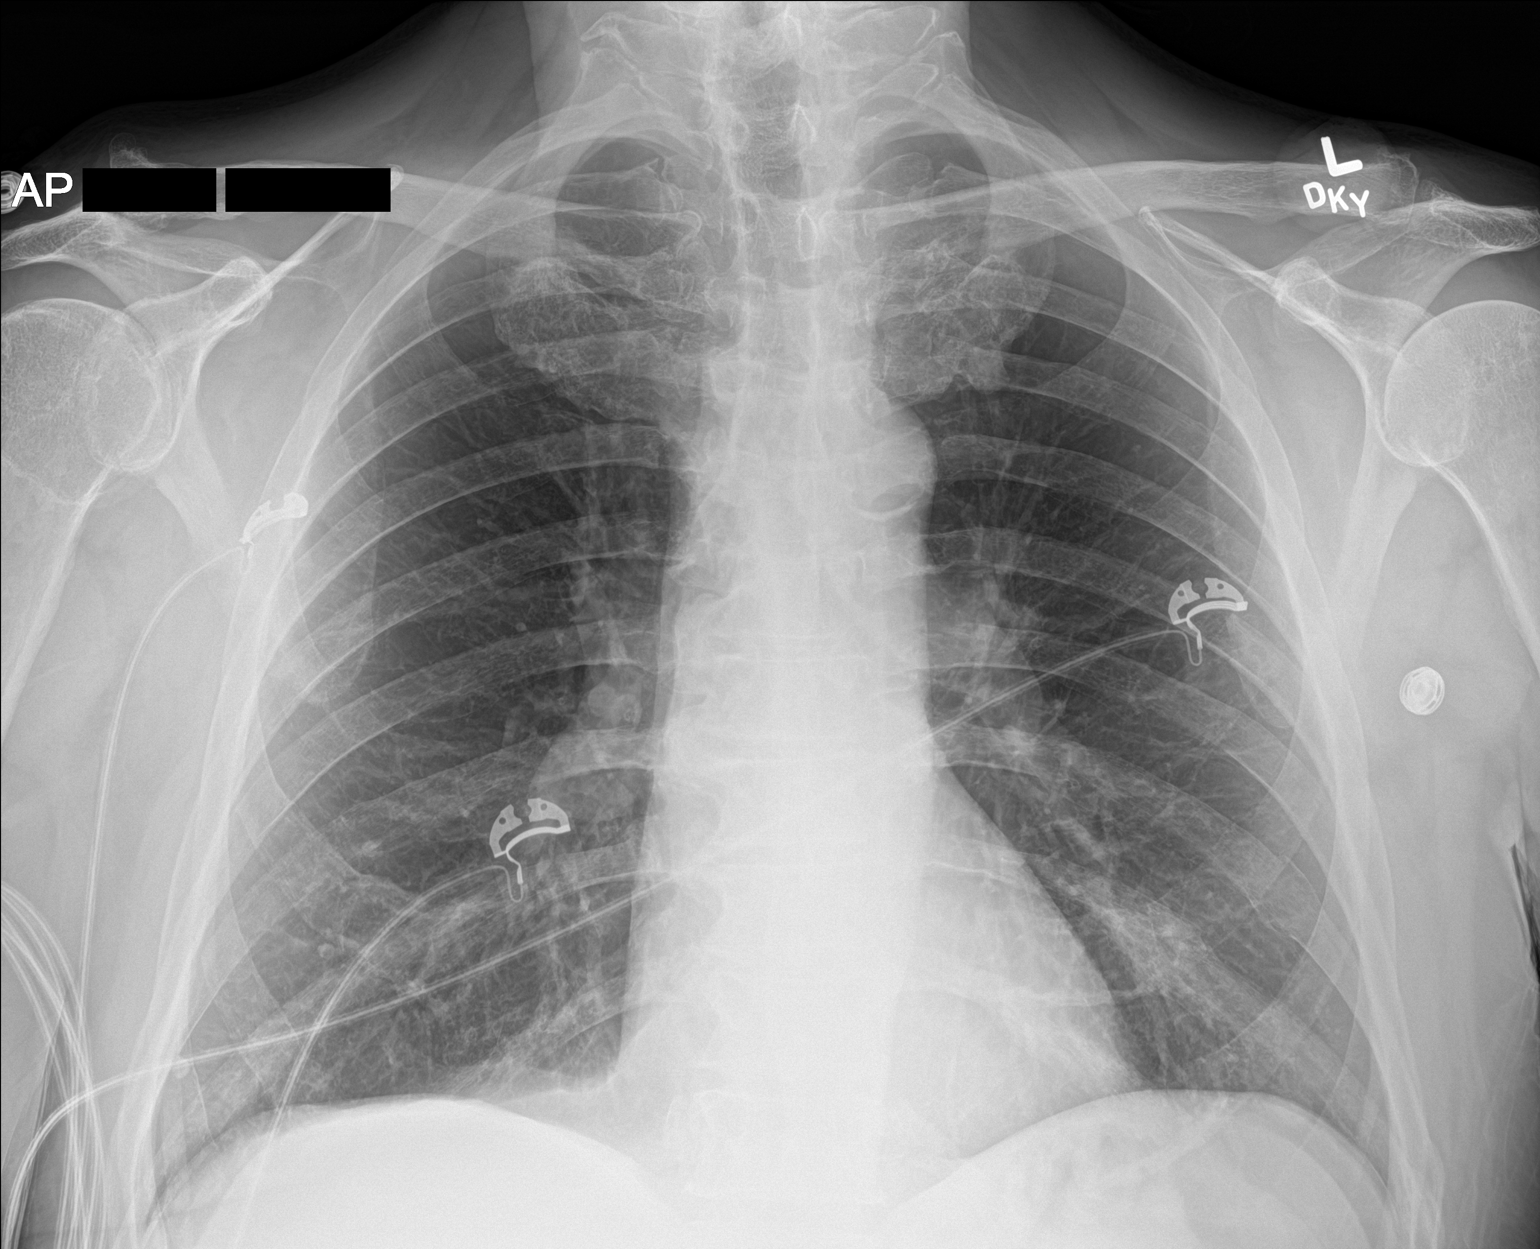

[1 of 1 positions shown; findings below may reference images not displayed]

FINDINGS: Atherosclerotic calcification of the aortic arch. Thoracic
spondylosis noted.

The lungs appear clear. No blunting of the costophrenic angles. Mild
left lower lateral rib deformities likely from old healed fractures.
Heart size within normal limits. Incidental calcified granulomas the
right lung base.
IMPRESSION: 1. No acute findings. A pulmonary cause for sepsis is not
identified.
2. Atherosclerotic calcification of the aortic arch.
3. Thoracic spondylosis.

## 2020-08-18 MED ORDER — SODIUM CHLORIDE 0.9 % IV SOLN
1.0000 g | INTRAVENOUS | Status: DC
  Administered 2020-08-18 – 2020-08-19 (×2): 1 g via INTRAVENOUS
  Filled 2020-08-18 (×2): qty 10

## 2020-08-18 MED ORDER — ICOSAPENT ETHYL 1 G PO CAPS
2.0000 g | ORAL_CAPSULE | Freq: Two times a day (BID) | ORAL | Status: DC
  Administered 2020-08-19 – 2020-08-22 (×8): 2 g via ORAL
  Filled 2020-08-18 (×11): qty 2

## 2020-08-18 MED ORDER — ZOLPIDEM TARTRATE 5 MG PO TABS
5.0000 mg | ORAL_TABLET | Freq: Every evening | ORAL | Status: DC | PRN
  Filled 2020-08-18: qty 1

## 2020-08-18 MED ORDER — SODIUM ZIRCONIUM CYCLOSILICATE 10 G PO PACK: 10.0000 g | PACK | Freq: Two times a day (BID) | ORAL | Status: DC

## 2020-08-18 MED ORDER — SODIUM CHLORIDE 0.9 % BOLUS PEDS: 1000.0000 mL | Freq: Once | INTRAVENOUS | Status: DC

## 2020-08-18 MED ORDER — LACTATED RINGERS IV SOLN: INTRAVENOUS | Status: DC

## 2020-08-18 MED ORDER — CALCIUM GLUCONATE-NACL 2-0.675 GM/100ML-% IV SOLN
2.0000 g | Freq: Once | INTRAVENOUS | Status: AC
  Administered 2020-08-18: 2000 mg via INTRAVENOUS
  Filled 2020-08-18: qty 100

## 2020-08-18 MED ORDER — ALLOPURINOL 300 MG PO TABS
300.0000 mg | ORAL_TABLET | Freq: Every evening | ORAL | Status: DC
  Administered 2020-08-19 – 2020-08-21 (×3): 300 mg via ORAL
  Filled 2020-08-18: qty 3
  Filled 2020-08-18 (×5): qty 1

## 2020-08-18 MED ORDER — SODIUM ZIRCONIUM CYCLOSILICATE 10 G PO PACK
10.0000 g | PACK | Freq: Two times a day (BID) | ORAL | Status: DC
  Administered 2020-08-18: 10 g via ORAL
  Filled 2020-08-18: qty 1

## 2020-08-18 MED ORDER — LEFLUNOMIDE 20 MG PO TABS
20.0000 mg | ORAL_TABLET | Freq: Every day | ORAL | Status: DC
  Administered 2020-08-19: 20 mg via ORAL
  Filled 2020-08-18 (×3): qty 1

## 2020-08-18 MED ORDER — SODIUM CHLORIDE 0.9 % IV SOLN: INTRAVENOUS | Status: DC

## 2020-08-18 MED ORDER — CALCIUM GLUCONATE-NACL 1-0.675 GM/50ML-% IV SOLN
1.0000 g | Freq: Once | INTRAVENOUS | Status: AC
  Administered 2020-08-18: 1000 mg via INTRAVENOUS
  Filled 2020-08-18: qty 50

## 2020-08-18 MED ORDER — LACTATED RINGERS IV BOLUS (SEPSIS): 1000.0000 mL | Freq: Once | INTRAVENOUS | Status: DC

## 2020-08-18 MED ORDER — TAMSULOSIN HCL 0.4 MG PO CAPS: 0.4000 mg | ORAL_CAPSULE | Freq: Every day | ORAL | Status: DC

## 2020-08-18 MED ORDER — PANTOPRAZOLE SODIUM 40 MG IV SOLR: 40.0000 mg | INTRAVENOUS | Status: DC

## 2020-08-18 MED ORDER — NICOTINE 14 MG/24HR TD PT24
14.0000 mg | MEDICATED_PATCH | Freq: Every day | TRANSDERMAL | Status: DC
  Filled 2020-08-18 (×3): qty 1

## 2020-08-18 MED ORDER — BACLOFEN 10 MG PO TABS
10.0000 mg | ORAL_TABLET | Freq: Three times a day (TID) | ORAL | Status: DC
Start: 2020-08-18 — End: 2020-08-19
  Administered 2020-08-19: 10 mg via ORAL
  Filled 2020-08-18 (×3): qty 1

## 2020-08-18 MED ORDER — SODIUM POLYSTYRENE SULFONATE 15 GM/60ML PO SUSP
30.0000 g | Freq: Once | ORAL | Status: DC
  Filled 2020-08-18: qty 120

## 2020-08-18 MED ORDER — HYDROCODONE-ACETAMINOPHEN 10-325 MG PO TABS
1.0000 | ORAL_TABLET | Freq: Four times a day (QID) | ORAL | Status: DC | PRN
Start: 2020-08-18 — End: 2020-08-22
  Administered 2020-08-19 – 2020-08-22 (×10): 1 via ORAL
  Filled 2020-08-18 (×10): qty 1

## 2020-08-18 MED ORDER — TAMSULOSIN HCL 0.4 MG PO CAPS
0.4000 mg | ORAL_CAPSULE | Freq: Every day | ORAL | Status: DC
  Administered 2020-08-18 – 2020-08-19 (×2): 0.4 mg via ORAL
  Filled 2020-08-18 (×2): qty 1

## 2020-08-18 MED ORDER — FAMOTIDINE 20 MG PO TABS
20.0000 mg | ORAL_TABLET | Freq: Every day | ORAL | Status: DC
  Administered 2020-08-19 – 2020-08-22 (×4): 20 mg via ORAL
  Filled 2020-08-18 (×5): qty 1

## 2020-08-18 MED ORDER — ACETAMINOPHEN 325 MG PO TABS: 650.0000 mg | ORAL_TABLET | Freq: Four times a day (QID) | ORAL | Status: DC | PRN

## 2020-08-18 MED ORDER — DIVALPROEX SODIUM ER 500 MG PO TB24
1000.0000 mg | ORAL_TABLET | Freq: Every day | ORAL | Status: DC
  Administered 2020-08-19 – 2020-08-21 (×4): 1000 mg via ORAL
  Filled 2020-08-18 (×7): qty 2

## 2020-08-18 MED ORDER — PRAVASTATIN SODIUM 40 MG PO TABS
40.0000 mg | ORAL_TABLET | Freq: Every evening | ORAL | Status: DC
  Administered 2020-08-19 – 2020-08-21 (×3): 40 mg via ORAL
  Filled 2020-08-18 (×4): qty 1

## 2020-08-18 MED ORDER — PANTOPRAZOLE SODIUM 40 MG PO TBEC
40.0000 mg | DELAYED_RELEASE_TABLET | Freq: Two times a day (BID) | ORAL | Status: DC
  Administered 2020-08-19 – 2020-08-22 (×6): 40 mg via ORAL
  Filled 2020-08-18 (×7): qty 1

## 2020-08-18 MED ORDER — SODIUM CHLORIDE 0.9 % IV BOLUS
1000.0000 mL | Freq: Once | INTRAVENOUS | Status: AC
  Administered 2020-08-18: 1000 mL via INTRAVENOUS

## 2020-08-18 MED ORDER — LACTATED RINGERS IV BOLUS (SEPSIS)
1000.0000 mL | Freq: Once | INTRAVENOUS | Status: DC
  Administered 2020-08-18: 1000 mL via INTRAVENOUS

## 2020-08-18 MED ORDER — PROCHLORPERAZINE EDISYLATE 10 MG/2ML IJ SOLN
10.0000 mg | Freq: Four times a day (QID) | INTRAMUSCULAR | Status: DC | PRN
  Filled 2020-08-18 (×2): qty 2

## 2020-08-18 MED ORDER — LACTATED RINGERS IV BOLUS (SEPSIS)
1000.0000 mL | Freq: Once | INTRAVENOUS | Status: AC
  Administered 2020-08-18: 1000 mL via INTRAVENOUS

## 2020-08-18 MED ORDER — CALCIUM CARBONATE 1250 (500 CA) MG PO TABS
1000.0000 mg | ORAL_TABLET | Freq: Three times a day (TID) | ORAL | Status: DC
  Administered 2020-08-18: 1000 mg via ORAL
  Filled 2020-08-18 (×2): qty 1

## 2020-08-18 MED ORDER — HEPARIN SODIUM (PORCINE) 5000 UNIT/ML IJ SOLN
5000.0000 [IU] | Freq: Three times a day (TID) | INTRAMUSCULAR | Status: DC
  Administered 2020-08-18 – 2020-08-20 (×6): 5000 [IU] via SUBCUTANEOUS
  Filled 2020-08-18 (×6): qty 1

## 2020-08-18 NOTE — ED Provider Notes (Signed)
[facility] Provider Note   CSN: [id number] Arrival date & time: 08/18/20  1510     History Chief Complaint  Patient presents with  . Weakness  . Vomiting    Eddie Taylor is a 65 y.o. male.  HPI   65 year old male, presents to the hospital today with a complaint of weakness and vomiting.  The patient reports that over the last couple of days he has had progressive generalized weakness, he has become nauseated and has been vomiting.  He has not had any fevers but today has had some chills.  He was actually at his doctor's office yesterday and according to the wife there was medication that was increased though they cannot tell me which one it was.  According to my review of the medical record it was Depakote that was increased to 1000 mg at night.  He is on Humira for his severe arthritis, he is on lisinopril, he is on Vicodin, he is on baclofen, he is also on metoprolol and hydrochlorothiazide.  The patient reports to me that he has been vomiting all day, he is very weak and was found to be hypotensive.  Symptoms are persistent gradually worsening and now severe.  No medications given prior to arrival.  The patient states that he was told to take only one of his blood pressure medications, it is listed as 5 mg of lisinopril in the medical record but the patient has a bottle of 40 mg tablets.  He has been taking 2 of these tablets a day until recently  Past Medical History:  Diagnosis Date  . Anxiety   . Arthritis   . Depression   . Diverticulitis   . Gastric ulcer   . Hyperlipidemia   . Hypertension   . Syncope and collapse 02/14/2019    Patient Active Problem List   Diagnosis Date Noted  . AKI (acute kidney injury) ([place]) 08/18/2020  . Dehydration   . AMS (altered mental status) 02/15/2019  . Right shoulder pain 02/15/2019  . Left wrist pain 02/15/2019  . Anemia 02/15/2019  . Alcohol dependence ([place]) 02/15/2019  . H pylori ulcer  03/14/2013  . Abdominal pain, unspecified site 03/13/2013  . Peptic ulcer disease 03/13/2013  . Cholelithiases 03/13/2013  . Melena 03/13/2013    Past Surgical History:  Procedure Laterality Date  . COLON SURGERY    . COLOSTOMY    . ESOPHAGOGASTRODUODENOSCOPY N/A 03/13/2013   Procedure: ESOPHAGOGASTRODUODENOSCOPY (EGD);  Surgeon: Lear Ng, MD;  Location: [facility] ENDOSCOPY;  Service: Endoscopy;  Laterality: N/A;  . rheumatoid arthritis    . SURGERY SCROTAL / TESTICULAR  2014       Family History  Problem Relation Age of Onset  . Brain cancer Mother   . Hypertension Mother   . Alcohol abuse Mother   . Depression Mother   . Depression Sister   . Heart disease Sister   . Alcohol abuse Sister   . Hypertension Sister   . Drug abuse Brother   . Cirrhosis Maternal Grandfather   . Hypertension Maternal Grandfather   . Hyperlipidemia Maternal Grandfather   . Heart disease Maternal Grandmother   . Diverticulitis Half-Brother   . Depression Half-Brother   . Depression Half-Sister     Social History   Tobacco Use  . Smoking status: Current Every Day Smoker    Packs/day: 0.25    Types: Cigarettes  . Smokeless tobacco: Never Used  Vaping Use  . Vaping Use: Never  used  Substance Use Topics  . Alcohol use: Yes    Alcohol/week: 12.0 standard drinks    Types: 12 Cans of beer per week  . Drug use: Not Currently    Types: Cocaine    Home Medications Prior to Admission medications   Medication Sig Start Date End Date Taking? Authorizing Provider  allopurinol (ZYLOPRIM) 300 MG tablet Take 300 mg by mouth every evening. 01/17/19  Yes [provider]  baclofen (LIORESAL) 10 MG tablet Take 10 mg by mouth 3 (three) times daily. 12/19/19  Yes [provider]  divalproex (DEPAKOTE ER) 500 MG 24 hr tablet Take 2 tablets (1,000 mg total) by mouth at bedtime. 08/16/20  Yes Hurst, Dorothea Glassman, PA-C  Eszopiclone 3 MG TABS Take 3 mg by mouth at bedtime. 07/30/20  Yes  [provider]  famotidine (PEPCID) 20 MG tablet Take 20 mg by mouth 2 (two) times daily. 04/30/20  Yes [provider]  hydrochlorothiazide (HYDRODIURIL) 25 MG tablet Take 25 mg by mouth daily.   Yes [provider]  HYDROcodone-acetaminophen (NORCO) 10-325 MG tablet Take 1 tablet by mouth every 6 (six) hours as needed for moderate pain or severe pain. 08/17/20  Yes [provider]  leflunomide (ARAVA) 20 MG tablet Take 20 mg by mouth daily. 04/18/20  Yes [provider]  lisinopril (ZESTRIL) 40 MG tablet Take 40 mg by mouth daily. 07/26/20  Yes [provider]  metoprolol succinate (TOPROL-XL) 50 MG 24 hr tablet Take 50 mg by mouth daily.  01/02/17  Yes [provider]  pantoprazole (PROTONIX) 40 MG tablet Take 1 tablet (40 mg total) by mouth 2 (two) times daily with a meal. Patient taking differently: Take 40 mg by mouth 2 (two) times daily. 03/16/13  Yes Thurnell Lose, MD  pravastatin (PRAVACHOL) 40 MG tablet Take 40 mg by mouth every evening. 12/05/16  Yes [provider]  VASCEPA 1 g CAPS Take 2 g by mouth 2 (two) times daily. 12/25/16  Yes [provider]  HUMIRA 40 MG/0.8ML PSKT Inject 40 mg into the muscle every 14 (fourteen) days. 01/03/17   [provider]    Allergies    Morphine and related, Prozac [fluoxetine hcl], and Sulfa antibiotics  Review of Systems   Review of Systems  All other systems reviewed and are negative.   Physical Exam Updated Vital Signs BP (!) 99/59   Pulse 87   Temp 98.2 F (36.8 C) (Oral)   Resp 12   SpO2 100%   Physical Exam Vitals and nursing note reviewed.  Constitutional:      General: He is not in acute distress.    Appearance: He is well-developed and well-nourished.  HENT:     Head: Normocephalic and atraumatic.     Mouth/Throat:     Mouth: Oropharynx is clear and moist. Mucous membranes are dry.     Pharynx: No oropharyngeal exudate.  Eyes:      General: No scleral icterus.       Right eye: No discharge.        Left eye: No discharge.     Extraocular Movements: EOM normal.     Conjunctiva/sclera: Conjunctivae normal.     Pupils: Pupils are equal, round, and reactive to light.  Neck:     Thyroid: No thyromegaly.     Vascular: No JVD.  Cardiovascular:     Rate and Rhythm: Normal rate and regular rhythm.     Pulses: Intact distal pulses.  Heart sounds: Normal heart sounds. No murmur heard. No friction rub. No gallop.   Pulmonary:     Effort: Pulmonary effort is normal. No respiratory distress.     Breath sounds: Normal breath sounds. No wheezing or rales.  Abdominal:     General: Bowel sounds are normal. There is no distension.     Palpations: Abdomen is soft. There is no mass.     Tenderness: There is no abdominal tenderness.     Comments: Diverting ostomy present in the lower abdomen, nontender  Musculoskeletal:        General: No tenderness or edema. Normal range of motion.     Cervical back: Normal range of motion and neck supple.  Lymphadenopathy:     Cervical: No cervical adenopathy.  Skin:    General: Skin is warm and dry.     Findings: No erythema or rash.  Neurological:     Mental Status: He is alert.     Coordination: Coordination normal.     Comments: Diffuse generalized weakness but able to speak in full sentences, appears weak but able to follow commands, symmetrical  Psychiatric:        Mood and Affect: Mood and affect normal.        Behavior: Behavior normal.     ED Results / Procedures / Treatments   Labs (all labs ordered are listed, but only abnormal results are displayed) Labs Reviewed  BASIC METABOLIC PANEL - Abnormal; Notable for the following components:      Result Value   Sodium 130 (*)    CO2 13 (*)    Glucose, Bld 119 (*)    BUN 82 (*)    Creatinine, Ser 5.85 (*)    Calcium 7.6 (*)    GFR, Estimated 10 (*)    Anion gap 18 (*)    All other components within normal limits  CBC -  Abnormal; Notable for the following components:   WBC 12.3 (*)    RBC 3.58 (*)    Hemoglobin 12.5 (*)    HCT 35.5 (*)    MCH 34.9 (*)    Platelets 147 (*)    All other components within normal limits  COMPREHENSIVE METABOLIC PANEL - Abnormal; Notable for the following components:   Sodium 129 (*)    Potassium 6.0 (*)    CO2 13 (*)    Glucose, Bld 100 (*)    BUN 82 (*)    Creatinine, Ser 5.68 (*)    Calcium 7.3 (*)    GFR, Estimated 11 (*)    Anion gap 16 (*)    All other components within normal limits  VALPROIC ACID LEVEL - Abnormal; Notable for the following components:   Valproic Acid Lvl 18 (*)    All other components within normal limits  SARS CORONAVIRUS 2 BY RT PCR (HOSPITAL ORDER, [facility])  CULTURE, BLOOD (ROUTINE X 2)  CULTURE, BLOOD (ROUTINE X 2)  URINE CULTURE  LACTIC ACID, PLASMA  PROTIME-INR  APTT  URINALYSIS, ROUTINE W REFLEX MICROSCOPIC  LACTIC ACID, PLASMA  BASIC METABOLIC PANEL  CBG MONITORING, ED    EKG EKG Interpretation  Date/Time:  Saturday August 18 2020 15:19:54 EST Ventricular Rate:  121 PR Interval:  132 QRS Duration: 106 QT Interval:  306 QTC Calculation: 434 R Axis:   80 Text Interpretation: Sinus tachycardia Otherwise normal ECG since prior - T waves appear peaked, pace increased Confirmed by Noemi Chapel [phone]) on 08/18/2020 5:04:50 PM  Radiology US RENAL  Result Date: 08/18/2020 CLINICAL DATA:  Generalized weakness.  Question urinary retention. EXAM: RENAL / URINARY TRACT ULTRASOUND COMPLETE COMPARISON:  Abdominal ultrasound 03/13/2013 FINDINGS: Right Kidney: Renal measurements: 10.9 x 5.0 x 4.7 cm = volume: 132 mL. Echogenicity within normal limits. No mass or hydronephrosis visualized. Left Kidney: Renal measurements: 11.8 x 5.4 x 5.3 cm = volume: 178 mL. Echogenicity within normal limits. No mass or hydronephrosis visualized. Bladder: Only minimally distended. No wall thickening for degree of  distension. Other: None. IMPRESSION: 1. Unremarkable sonographic appearance of the kidneys. 2. Urinary bladder is only minimally distended. Electronically Signed   By: Keith Rake M.D.   On: 08/18/2020 18:34   DG Chest Port 1 View  Result Date: 08/18/2020 CLINICAL DATA:  Weakness and vomiting.  Possible sepsis. EXAM: PORTABLE CHEST 1 VIEW COMPARISON:  12/02/2008 FINDINGS: Atherosclerotic calcification of the aortic arch. Thoracic spondylosis noted. The lungs appear clear. No blunting of the costophrenic angles. Mild left lower lateral rib deformities likely from old healed fractures. Heart size within normal limits. Incidental calcified granulomas the right lung base. IMPRESSION: 1. No acute findings. A pulmonary cause for sepsis is not identified. 2. Atherosclerotic calcification of the aortic arch. 3. Thoracic spondylosis. Electronically Signed   By: Van Clines M.D.   On: 08/18/2020 17:54    Procedures .Critical Care Performed by: Noemi Chapel, MD Authorized by: Noemi Chapel, MD   Critical care provider statement:    Critical care time (minutes):  35   Critical care time was exclusive of:  Separately billable procedures and treating other patients and teaching time   Critical care was necessary to treat or prevent imminent or life-threatening deterioration of the following conditions:  Renal failure and shock   Critical care was time spent personally by me on the following activities:  Blood draw for specimens, development of treatment plan with patient or surrogate, discussions with consultants, evaluation of patient's response to treatment, examination of patient, obtaining history from patient or surrogate, ordering and performing treatments and interventions, ordering and review of laboratory studies, ordering and review of radiographic studies, pulse oximetry, re-evaluation of patient's condition and review of old charts Comments:           Medications Ordered in  ED Medications  lactated ringers infusion (has no administration in time range)  lactated ringers bolus 1,000 mL (has no administration in time range)    And  lactated ringers bolus 1,000 mL (1,000 mLs Intravenous New Bag/Given 08/18/20 1853)  cefTRIAXone (ROCEPHIN) 1 g in sodium chloride 0.9 % 100 mL IVPB (1 g Intravenous New Bag/Given 08/18/20 1811)  calcium gluconate 1 g/ 50 mL sodium chloride IVPB (has no administration in time range)  sodium polystyrene (KAYEXALATE) 15 GM/60ML suspension 30 g (has no administration in time range)  sodium chloride 0.9 % bolus 1,000 mL (1,000 mLs Intravenous New Bag/Given 08/18/20 1816)    ED Course  I have reviewed the triage vital signs and the nursing notes.  Pertinent labs & imaging results that were available during my care of the patient were reviewed by me and considered in my medical decision making (see chart for details).    MDM Rules/Calculators/A&P                          Due to the patient's hypotension, subjective chills, acute kidney injury urinary tract infection is suspected however this could be dehydration, this could be related to sepsis, this could  be related to medication side effects to the lisinopril or combination of the above.  At this time the patient will need a Depakote level, sepsis protocol with cultures, IV fluids at 30 cc/kg given his hypotension, lactic acid pending at this time.  He is critically ill  The patient's labs show leukocytosis of 12,300, he also has a BUN of 82 and a creatinine of 5.8 which is new, his baseline is normal.  The patient has had plenty of IV fluids, he is making a little bit of urine but has not been able to pass any yet.  He is Covid negative, lactate is normal, acute renal failure was shared with the hospitalist Dr. Nevada Crane, she will admit to the hospital.  This patient is critically ill with a new acute renal failure  Final Clinical Impression(s) / ED Diagnoses Final diagnoses:  Acute renal  failure, unspecified acute renal failure type ([place])  Hypotension, unspecified hypotension type  Hyperkalemia      Noemi Chapel, MD 08/18/20 2026

## 2020-08-18 NOTE — H&P (Addendum)
History and Physical  Eddie Taylor TFT:[id number] DOB: 1955-12-11 DOA: 08/18/2020  Referring physician: Dr. Sabra Heck, [place]  PCP: [name], MD  Outpatient Specialists: Psychiatry, GI Patient coming from: Home  Chief Complaint: Nausea vomiting generalized weakness  HPI: Eddie Taylor is a 65 y.o. male with medical history significant for essential hypertension, peptic ulcer disease, GERD, H. pylori infection, rheumatoid arthritis, tobacco and alcohol use disorder, major depressive disorder, irritability/anger disorder on Depakote who presented to [facility] ED with complaints of intractable nausea vomiting and generalized weakness of 2 days duration.  Denies fevers.  Admits to chills.  Endorses difficulty emptying his bladder.  Denies history of BPH.  He was seen 2 days prior to his presentation at his psychiatrist's office for which his Depakote dose was increased to 1000 mg nightly.  Patient reports he was told by his family medicine doctor to take lisinopril 40 mg twice daily then to reduce the dose to 40 mg daily later due to low blood pressure.  On his list of medications lisinopril is listed to be taken 5 mg daily.  Last dose of lisinopril 40 mg daily was taken on 08/17/20 AM.  Associated symptoms include poor appetite and epigastric pain.  In the ED he was hypotensive and responded to IV fluid boluses, received 2 L lactated ringer and 1 L of normal saline.  Code sepsis was activated in the ED.  He was started on Rocephin empirically for possible UTI.  Blood cultures and urine culture obtained prior to antibiotics administration, pending.  TRH asked to admit.  ED Course: Afebrile, BP 70/37, heart rate 116.  Lab studies remarkable for WBC 12.3K, platelet count 147K, serum sodium 129, serum potassium 6.0, serum bicarb 13, creatinine 5.85 with GFR of 10, baseline creatinine 0.6 with GFR greater than 60.  Lactic acid 1.3.  Review of Systems: Review of systems as noted in the HPI. All other systems reviewed  and are negative.   Past Medical History:  Diagnosis Date  . Anxiety   . Arthritis   . Depression   . Diverticulitis   . Gastric ulcer   . Hyperlipidemia   . Hypertension   . Syncope and collapse 02/14/2019   Past Surgical History:  Procedure Laterality Date  . COLON SURGERY    . COLOSTOMY    . ESOPHAGOGASTRODUODENOSCOPY N/A 03/13/2013   Procedure: ESOPHAGOGASTRODUODENOSCOPY (EGD);  Surgeon: Lear Ng, MD;  Location: [facility] ENDOSCOPY;  Service: Endoscopy;  Laterality: N/A;  . rheumatoid arthritis    . SURGERY SCROTAL / TESTICULAR  2014    Social History:  reports that he has been smoking cigarettes. He has been smoking about 0.25 packs per day. He has never used smokeless tobacco. He reports current alcohol use of about 12.0 standard drinks of alcohol per week. He reports previous drug use. Drug: Cocaine.   Allergies  Allergen Reactions  . Morphine And Related     "i go beside myself, i dont know. Its not pretty."   . Prozac [Fluoxetine Hcl] Itching  . Sulfa Antibiotics Other (See Comments)    Doesn't feel well when taking     Family History  Problem Relation Age of Onset  . Brain cancer Mother   . Hypertension Mother   . Alcohol abuse Mother   . Depression Mother   . Depression Sister   . Heart disease Sister   . Alcohol abuse Sister   . Hypertension Sister   . Drug abuse Brother   . Cirrhosis Maternal Grandfather   .  Hypertension Maternal Grandfather   . Hyperlipidemia Maternal Grandfather   . Heart disease Maternal Grandmother   . Diverticulitis Half-Brother   . Depression Half-Brother   . Depression Half-Sister       Prior to Admission medications   Medication Sig Start Date End Date Taking? Authorizing Provider  allopurinol (ZYLOPRIM) 300 MG tablet Take 300 mg by mouth every evening. 01/17/19  Yes [provider]  baclofen (LIORESAL) 10 MG tablet Take 10 mg by mouth 3 (three) times daily. 12/19/19  Yes [provider]  divalproex  (DEPAKOTE ER) 500 MG 24 hr tablet Take 2 tablets (1,000 mg total) by mouth at bedtime. 08/16/20  Yes Hurst, Glade Nurseeresa T, PA-C  Eszopiclone 3 MG TABS Take 3 mg by mouth at bedtime. 07/30/20  Yes [provider]  famotidine (PEPCID) 20 MG tablet Take 20 mg by mouth 2 (two) times daily. 04/30/20  Yes [provider]  hydrochlorothiazide (HYDRODIURIL) 25 MG tablet Take 25 mg by mouth daily.   Yes [provider]  HYDROcodone-acetaminophen (NORCO) 10-325 MG tablet Take 1 tablet by mouth every 6 (six) hours as needed for moderate pain or severe pain. 08/17/20  Yes [provider]  leflunomide (ARAVA) 20 MG tablet Take 20 mg by mouth daily. 04/18/20  Yes [provider]  lisinopril (ZESTRIL) 40 MG tablet Take 40 mg by mouth daily. 07/26/20  Yes [provider]  metoprolol succinate (TOPROL-XL) 50 MG 24 hr tablet Take 50 mg by mouth daily.  01/02/17  Yes [provider]  pantoprazole (PROTONIX) 40 MG tablet Take 1 tablet (40 mg total) by mouth 2 (two) times daily with a meal. Patient taking differently: Take 40 mg by mouth 2 (two) times daily. 03/16/13  Yes Leroy SeaSingh, Prashant K, MD  pravastatin (PRAVACHOL) 40 MG tablet Take 40 mg by mouth every evening. 12/05/16  Yes [provider]  VASCEPA 1 g CAPS Take 2 g by mouth 2 (two) times daily. 12/25/16  Yes [provider]  HUMIRA 40 MG/0.8ML PSKT Inject 40 mg into the muscle every 14 (fourteen) days. 01/03/17   [provider]    Physical Exam: BP (!) 99/59   Pulse 87   Temp 98.2 F (36.8 C) (Oral)   Resp 12   SpO2 100%   . General: 65 y.o. year-old male well developed well nourished in no acute distress.  Alert and oriented x3. . Cardiovascular: Regular rate and rhythm with no rubs or gallops.  No thyromegaly or JVD noted.  No lower extremity edema. 2/4 pulses in all 4 extremities. Marland Kitchen. Respiratory: Clear to auscultation with no wheezes or rales. Good inspiratory effort. . Abdomen:  Soft epigastric tenderness on palpation.  Nondistended with normal bowel sounds x4 quadrants.  Colostomy bag in place. . Muskuloskeletal: No cyanosis, clubbing or edema noted bilaterally . Neuro: CN II-XII intact, strength, sensation, reflexes . Skin: No ulcerative lesions noted or rashes . Psychiatry: Judgement and insight appear normal. Mood is appropriate for condition and setting          Labs on Admission:  Basic Metabolic Panel: Recent Labs  Lab 08/18/20 1603 08/18/20 1731  NA 130* 129*  K 5.0 6.0*  CL 99 100  CO2 13* 13*  GLUCOSE 119* 100*  BUN 82* 82*  CREATININE 5.85* 5.68*  CALCIUM 7.6* 7.3*   Liver Function Tests: Recent Labs  Lab 08/18/20 1731  AST 18  ALT 15  ALKPHOS 40  BILITOT 0.9  PROT 7.8  ALBUMIN 4.0  No results for input(s): LIPASE, AMYLASE in the last 168 hours. No results for input(s): AMMONIA in the last 168 hours. CBC: Recent Labs  Lab 08/18/20 1603  WBC 12.3*  HGB 12.5*  HCT 35.5*  MCV 99.2  PLT 147*   Cardiac Enzymes: No results for input(s): CKTOTAL, CKMB, CKMBINDEX, TROPONINI in the last 168 hours.  BNP (last 3 results) No results for input(s): BNP in the last 8760 hours.  ProBNP (last 3 results) No results for input(s): PROBNP in the last 8760 hours.  CBG: No results for input(s): GLUCAP in the last 168 hours.  Radiological Exams on Admission: US RENAL  Result Date: 08/18/2020 CLINICAL DATA:  Generalized weakness.  Question urinary retention. EXAM: RENAL / URINARY TRACT ULTRASOUND COMPLETE COMPARISON:  Abdominal ultrasound 03/13/2013 FINDINGS: Right Kidney: Renal measurements: 10.9 x 5.0 x 4.7 cm = volume: 132 mL. Echogenicity within normal limits. No mass or hydronephrosis visualized. Left Kidney: Renal measurements: 11.8 x 5.4 x 5.3 cm = volume: 178 mL. Echogenicity within normal limits. No mass or hydronephrosis visualized. Bladder: Only minimally distended. No wall thickening for degree of distension. Other: None.  IMPRESSION: 1. Unremarkable sonographic appearance of the kidneys. 2. Urinary bladder is only minimally distended. Electronically Signed   By: Keith Rake M.D.   On: 08/18/2020 18:34   DG Chest Port 1 View  Result Date: 08/18/2020 CLINICAL DATA:  Weakness and vomiting.  Possible sepsis. EXAM: PORTABLE CHEST 1 VIEW COMPARISON:  12/02/2008 FINDINGS: Atherosclerotic calcification of the aortic arch. Thoracic spondylosis noted. The lungs appear clear. No blunting of the costophrenic angles. Mild left lower lateral rib deformities likely from old healed fractures. Heart size within normal limits. Incidental calcified granulomas the right lung base. IMPRESSION: 1. No acute findings. A pulmonary cause for sepsis is not identified. 2. Atherosclerotic calcification of the aortic arch. 3. Thoracic spondylosis. Electronically Signed   By: Van Clines M.D.   On: 08/18/2020 17:54    EKG: I independently viewed the EKG done and my findings are as followed: None available at the time of this dictation.  Assessment/Plan Present on Admission: . AKI (acute kidney injury) ([place])  Active Problems:   AKI (acute kidney injury) ([place])  AKI, likely multifactorial in the setting of dehydration from vomiting and from overuse of lisinopril, likely contributed by acute urinary retention. Possible misunderstanding on how to take his home lisinopril Patient and wife report being told by his family medical doctor to take lisinopril 40 mg twice daily then to reduce the dose to 40 mg daily due to hypotension, however on his list of medications lisinopril is listed to be taken 5 mg daily.  Last dose of lisinopril 40 mg daily was on 08/17/20 AM.  Baseline creatinine 0.6 with GFR of 104 Presented with creatinine of 5.85 with GFR of 10. Continue IV fluid hydration Continue to hold off home lisinopril and home HCTZ Continue to avoid nephrotoxins, dehydration and hypotension Maintain MAP greater than 65 Monitor urine  output Follow renal ultrasound results. Repeat renal panel in the morning  Moderate acute hyperkalemia suspect secondary to high doses of ACE inhibitor Presented with serum potassium 6.0 Received IV calcium gluconate by EDP Obtain twelve-lead EKG Add Lokelma, 10 mg twice daily x2 doses. Repeat BMP  Anion gap metabolic acidosis likely secondary to acute kidney injury Presented with serum bicarb 13 and anion gap of 16 Obtain VBG to assess pH Continue IV fluid Repeat BMP, if no improvement of serum bicarb, start isotonic bicarb drip x1 day.  Hypocalcemia Presented with serum calcium of 7.3, corrected calcium for albumin. Add 2 g of IV calcium gluconate Also replete orally Repeat BMP  Mild hypovolemic hyponatremia Presented with serum sodium of 129 Continue IV fluid Continue to encourage oral intake, use IV antiemetics as needed.  Acute urinary retention Patient denies prior history of BPH States he has had difficulty emptying his bladder for about 3 months Follow acute urinary retention protocol Add Flomax Follow renal ultrasound results  Intractable nausea vomiting: Rule out active infective process Follow-up urine analysis, pending at the time of this dictation Follow urine culture and blood cultures Started on Rocephin empirically for presumed UTI IV antiemetics as needed Continue IV fluid to avoid dehydration  Presumed severe sepsis of unclear source Code sepsis activated in the ED due to presenting tachycardia 116, leukocytosis 12.3K, hypotension BP 70/37, and presumed UTI Received 3 L of IV fluid boluses, tolerated well, currently not on vasopressor, on IV fluid maintenance. Started on Rocephin empirically Monitor fever curve and WBC Repeat CBC  History of peptic ulcer disease and history of H. pylori infection He has an endoscopy planned outpatient on August 23, 2020. Start PPI  Intermittent epigastric pain in the setting of peptic ulcer disease Management  as per above Normal LFTs, check lipase level.  Major depressive disorder/irritability anger disorder Continue home Depakote  Essential hypertension, currently hypotensive Hold off home oral antihypertensives Continue to monitor vital signs.  Insomnia Resume home regimen.  Post colostomy No acute issues.  Tobacco/alcohol use disorder Last alcohol intake was over a week ago Nicotine patch as needed No evidence of alcohol withdrawal at the time of this visit.    DVT prophylaxis: Subcu heparin 3 times daily  Code Status: Full code as stated by the patient himself.  Family Communication: Updated his wife at bedside.  Disposition Plan: Admit to telemetry medical  Consults called: None.  Admission status: Inpatient status.  Patient will require at least 2 midnights for further evaluation and treatment of present condition.   Status is: Inpatient    Dispo:  Patient From: Home  Planned Disposition: Home  Expected discharge date: 08/20/2020  Medically stable for discharge: No, ongoing management of AKI, possible severe sepsis of unclear source.         Kayleen Memos MD [facility] Pager [phone]  If 7PM-7AM, please contact night-coverage [email or web address] Password [email or web address]  08/18/2020, 8:30 PM

## 2020-08-18 NOTE — Sepsis Progress Note (Signed)
Reached out to bedside nurse to clarify if blood cultures were drawn before antibiotics. No answer at this time.

## 2020-08-18 NOTE — ED Notes (Signed)
1st set of BLood cultures and lactic acid were drawn before the antibiotics were started.

## 2020-08-18 NOTE — ED Triage Notes (Signed)
Reports generalized weakness, low BP, vomiting, and blurred vision since yesterday.  No arm drift.

## 2020-08-18 NOTE — Sepsis Progress Note (Signed)
Sepsis protocol is being monitored by [facility]. 

## 2020-08-19 ENCOUNTER — Inpatient Hospital Stay (HOSPITAL_COMMUNITY): Payer: Medicare HMO

## 2020-08-19 DIAGNOSIS — E860 Dehydration: Secondary | ICD-10-CM

## 2020-08-19 DIAGNOSIS — N179 Acute kidney failure, unspecified: Secondary | ICD-10-CM | POA: Diagnosis not present

## 2020-08-19 DIAGNOSIS — I959 Hypotension, unspecified: Secondary | ICD-10-CM | POA: Diagnosis present

## 2020-08-19 DIAGNOSIS — R112 Nausea with vomiting, unspecified: Secondary | ICD-10-CM | POA: Diagnosis present

## 2020-08-19 DIAGNOSIS — G4700 Insomnia, unspecified: Secondary | ICD-10-CM | POA: Diagnosis present

## 2020-08-19 DIAGNOSIS — E872 Acidosis: Secondary | ICD-10-CM

## 2020-08-19 DIAGNOSIS — R6510 Systemic inflammatory response syndrome (SIRS) of non-infectious origin without acute organ dysfunction: Secondary | ICD-10-CM

## 2020-08-19 DIAGNOSIS — E8729 Other acidosis: Secondary | ICD-10-CM | POA: Diagnosis present

## 2020-08-19 DIAGNOSIS — K279 Peptic ulcer, site unspecified, unspecified as acute or chronic, without hemorrhage or perforation: Secondary | ICD-10-CM

## 2020-08-19 LAB — CBC
HCT: 27.7 % — ABNORMAL LOW (ref 39.0–52.0)
Hemoglobin: 9.4 g/dL — ABNORMAL LOW (ref 13.0–17.0)
MCH: 34.6 pg — ABNORMAL HIGH (ref 26.0–34.0)
MCHC: 33.9 g/dL (ref 30.0–36.0)
MCV: 101.8 fL — ABNORMAL HIGH (ref 80.0–100.0)
Platelets: 87 10*3/uL — ABNORMAL LOW (ref 150–400)
RBC: 2.72 MIL/uL — ABNORMAL LOW (ref 4.22–5.81)
RDW: 13.6 % (ref 11.5–15.5)
WBC: 5.5 10*3/uL (ref 4.0–10.5)
nRBC: 0 % (ref 0.0–0.2)

## 2020-08-19 LAB — ECHOCARDIOGRAM COMPLETE
Area-P 1/2: 4.21 cm2
Height: 72 in
Single Plane A4C EF: 66.9 %
Weight: 2864 oz

## 2020-08-19 LAB — COMPREHENSIVE METABOLIC PANEL
ALT: 11 U/L (ref 0–44)
AST: 16 U/L (ref 15–41)
Albumin: 2.8 g/dL — ABNORMAL LOW (ref 3.5–5.0)
Alkaline Phosphatase: 30 U/L — ABNORMAL LOW (ref 38–126)
Anion gap: 9 (ref 5–15)
BUN: 48 mg/dL — ABNORMAL HIGH (ref 8–23)
CO2: 24 mmol/L (ref 22–32)
Calcium: 7.7 mg/dL — ABNORMAL LOW (ref 8.9–10.3)
Chloride: 103 mmol/L (ref 98–111)
Creatinine, Ser: 1.59 mg/dL — ABNORMAL HIGH (ref 0.61–1.24)
GFR, Estimated: 48 mL/min — ABNORMAL LOW (ref >60)
Glucose, Bld: 144 mg/dL — ABNORMAL HIGH (ref 70–99)
Potassium: 3.6 mmol/L (ref 3.5–5.1)
Sodium: 136 mmol/L (ref 135–145)
Total Bilirubin: 0.9 mg/dL (ref 0.3–1.2)
Total Protein: 5.8 g/dL — ABNORMAL LOW (ref 6.5–8.1)

## 2020-08-19 LAB — BASIC METABOLIC PANEL
Anion gap: 14 (ref 5–15)
BUN: 64 mg/dL — ABNORMAL HIGH (ref 8–23)
CO2: 14 mmol/L — ABNORMAL LOW (ref 22–32)
Calcium: 7.3 mg/dL — ABNORMAL LOW (ref 8.9–10.3)
Chloride: 106 mmol/L (ref 98–111)
Creatinine, Ser: 2.77 mg/dL — ABNORMAL HIGH (ref 0.61–1.24)
GFR, Estimated: 25 mL/min — ABNORMAL LOW (ref >60)
Glucose, Bld: 86 mg/dL (ref 70–99)
Potassium: 3.7 mmol/L (ref 3.5–5.1)
Sodium: 134 mmol/L — ABNORMAL LOW (ref 135–145)

## 2020-08-19 LAB — PHOSPHORUS: Phosphorus: 4.2 mg/dL (ref 2.5–4.6)

## 2020-08-19 LAB — MAGNESIUM
Magnesium: 0.4 mg/dL — CL (ref 1.7–2.4)
Magnesium: 1.2 mg/dL — ABNORMAL LOW (ref 1.7–2.4)

## 2020-08-19 LAB — MRSA PCR SCREENING: MRSA by PCR: NEGATIVE

## 2020-08-19 IMAGING — CT CT ABD-PELV W/O CM
2 of 4 series · 16 of 46 positions shown, 18 images · non-contrast
Comparison: [DATE]

CLINICAL DATA: Nausea and vomiting for 2 days.  Sepsis.

EXAM:
CT ABDOMEN AND PELVIS WITHOUT CONTRAST
TECHNIQUE: Multidetector CT imaging of the abdomen and pelvis was performed
following the standard protocol without IV contrast.

[Series 3: ap without · axial · non-contrast · 0.82mm/px · z∈[+777,+1197]mm · 13 of 94 slices shown, 15 images]
[im 5/94  soft-tissue]
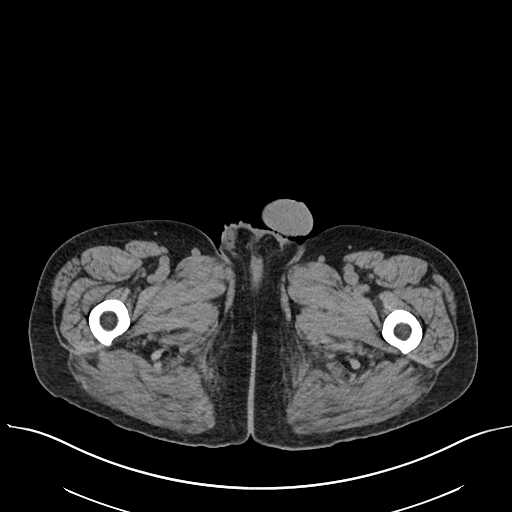
[im 5/94  bone]
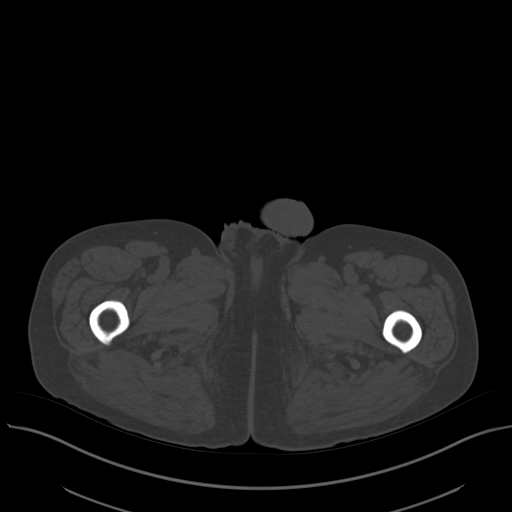
[im 15/94  soft-tissue]
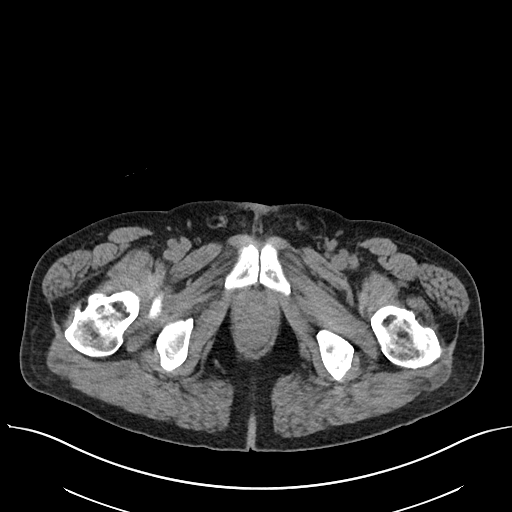
[im 20/94  soft-tissue]
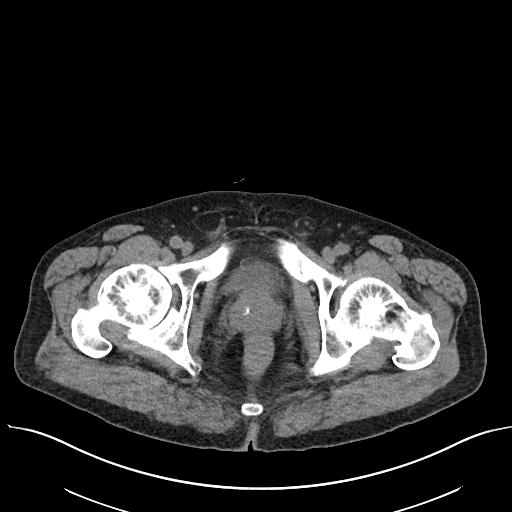
[im 25/94  soft-tissue]
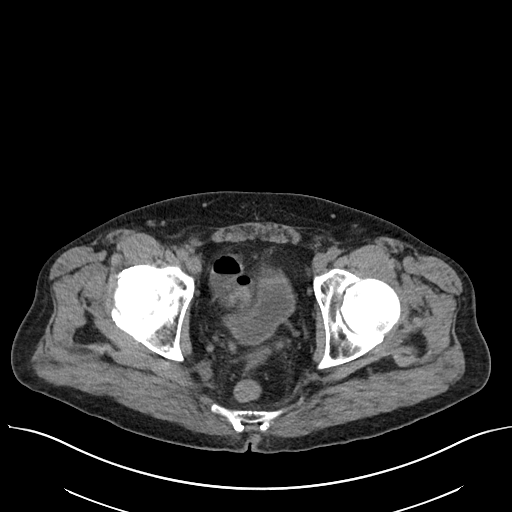
[im 35/94  soft-tissue]
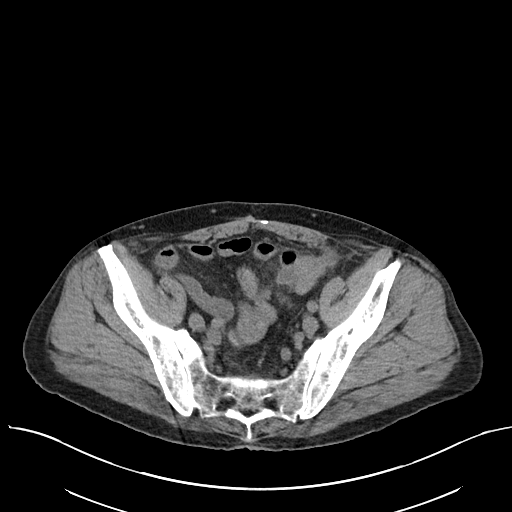
[im 40/94  soft-tissue]
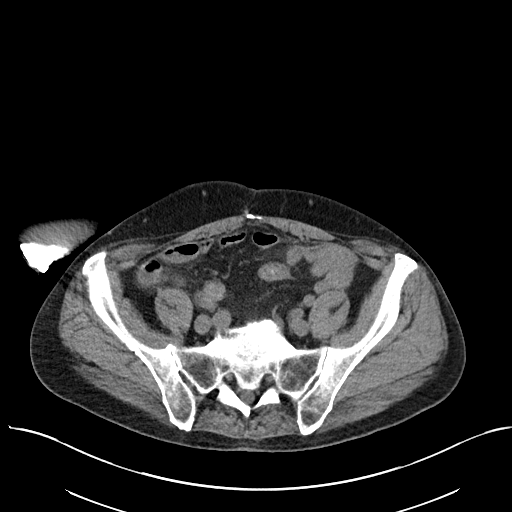
[im 49/94  soft-tissue]
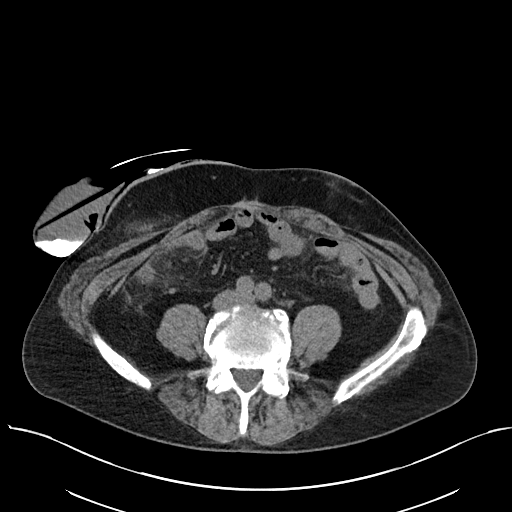
[im 54/94  soft-tissue]
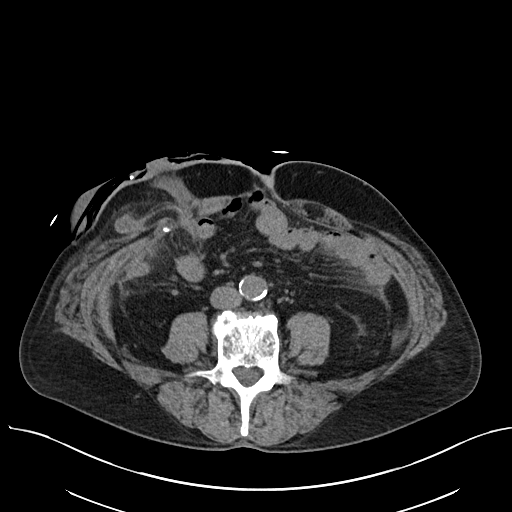
[im 59/94  soft-tissue]
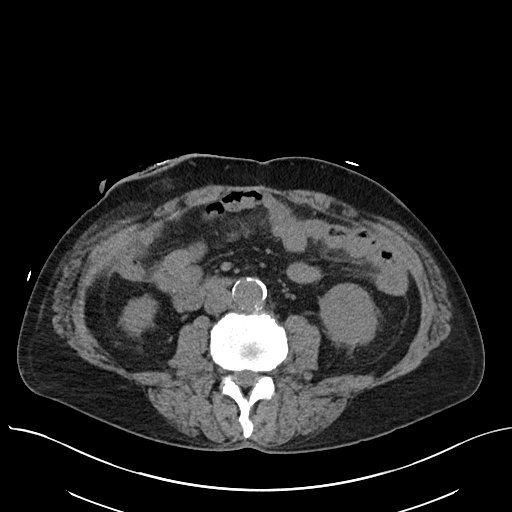
[im 59/94  bone]
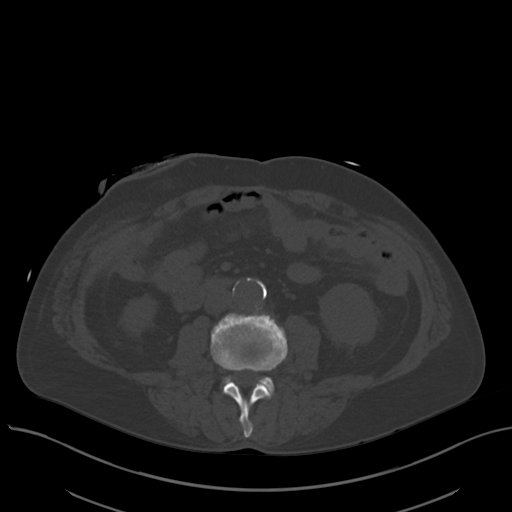
[im 69/94  soft-tissue]
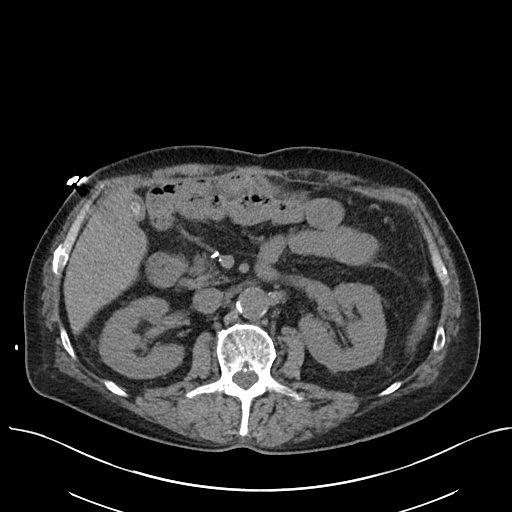
[im 74/94  soft-tissue]
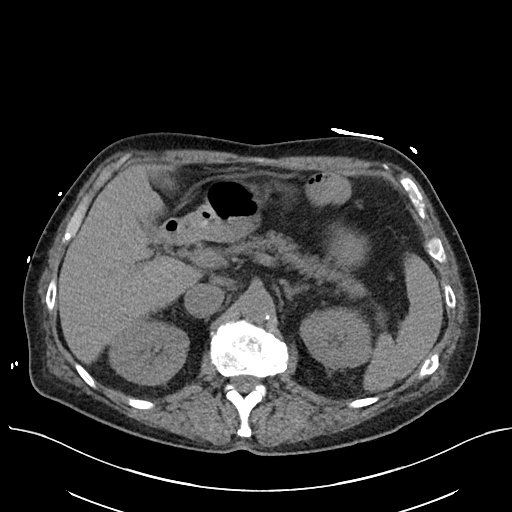
[im 79/94  soft-tissue]
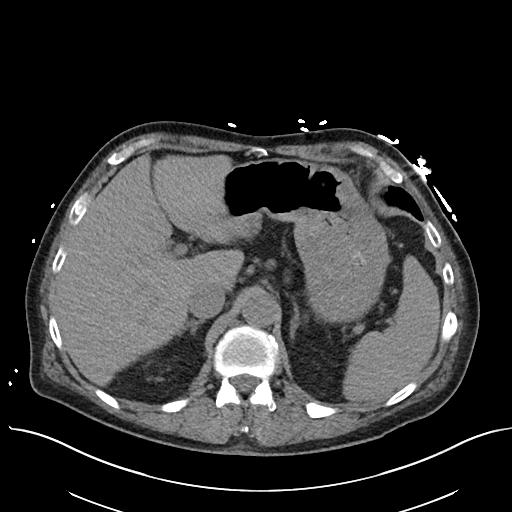
[im 89/94  soft-tissue]
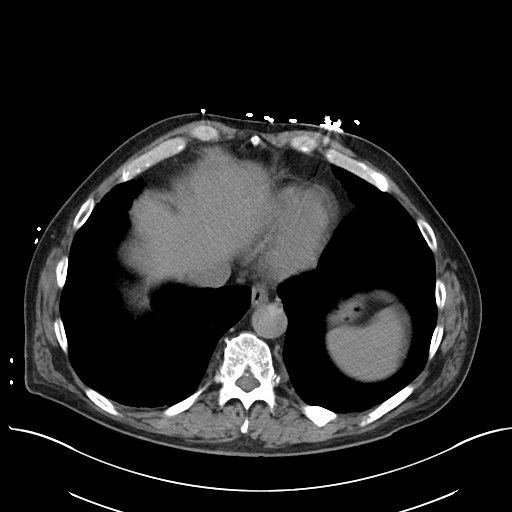

[Series 6: cor · coronal · 0.78mm/px · 3 of 108 slices shown]
[im 36/108  soft-tissue]
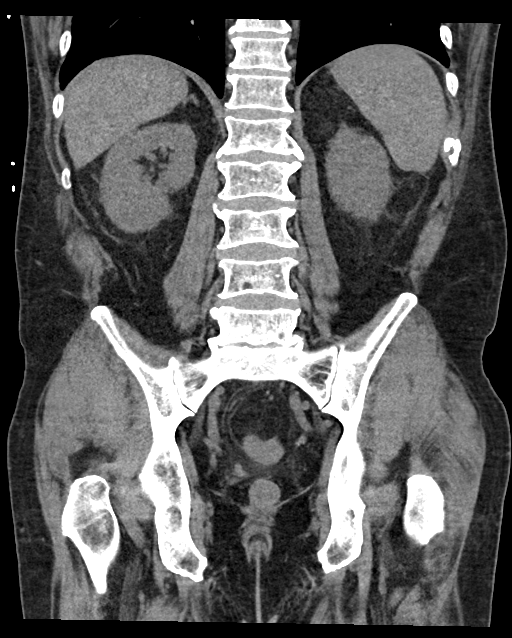
[im 48/108  soft-tissue]
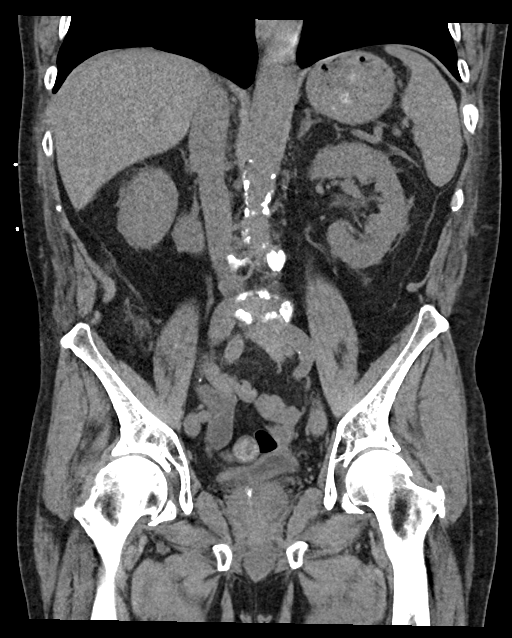
[im 60/108  soft-tissue]
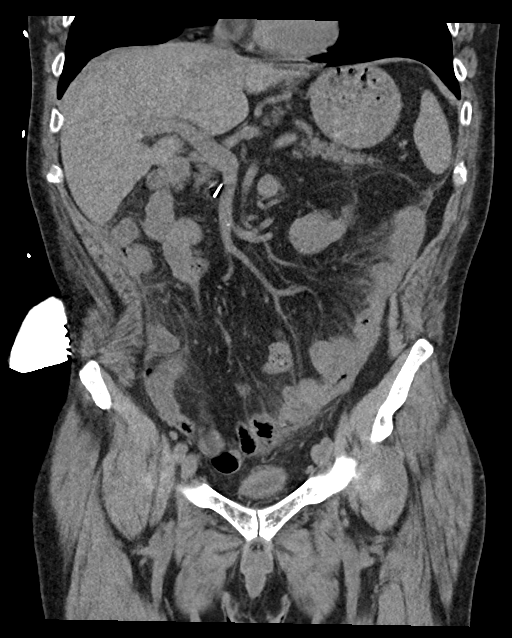

[16 of 46 positions shown; findings below may reference images not displayed]

FINDINGS: Lower chest:  Calcified pulmonary nodules.

Hepatobiliary: No focal liver abnormality.Cholelithiasis without
evidence of cholecystitis.

Pancreas: Unremarkable.

Spleen: Unremarkable.

Adrenals/Urinary Tract: Negative adrenals. No hydronephrosis or
stone. Unremarkable bladder.

Stomach/Bowel: Colectomy with ileostomy. There is stranding
involving the small bowel mesentery diffusely without detected small
bowel thickening. Small peristomal hernia which is chronic. No
detected bowel obstruction.

Vascular/Lymphatic: Atheromatous calcification of the aorta which is
extensive. No mass or adenopathy.

Reproductive:No acute finding

Other: No ascites or pneumoperitoneum.

Musculoskeletal: Healing left lower rib fractures. Lumbar spine
degeneration with L5-S1 foraminal stenosis.
IMPRESSION: 1. Small bowel mesenteric stranding favoring enteritis given the
clinical history. No bowel obstruction.
2. Ileostomy with chronic small parastomal hernia.
3.  Aortic Atherosclerosis ([LJ]-[LJ]).
4. Cholelithiasis without cholecystitis.

## 2020-08-19 MED ORDER — MAGNESIUM SULFATE 4 GM/100ML IV SOLN
4.0000 g | Freq: Once | INTRAVENOUS | Status: DC
  Filled 2020-08-19 (×2): qty 100

## 2020-08-19 MED ORDER — MIDODRINE HCL 5 MG PO TABS
10.0000 mg | ORAL_TABLET | Freq: Three times a day (TID) | ORAL | Status: AC
  Administered 2020-08-19 (×2): 10 mg via ORAL
  Filled 2020-08-19: qty 2

## 2020-08-19 MED ORDER — MIDODRINE HCL 5 MG PO TABS
5.0000 mg | ORAL_TABLET | Freq: Three times a day (TID) | ORAL | Status: DC
  Filled 2020-08-19: qty 1

## 2020-08-19 MED ORDER — MAGNESIUM OXIDE 400 (241.3 MG) MG PO TABS
800.0000 mg | ORAL_TABLET | Freq: Two times a day (BID) | ORAL | Status: AC
  Administered 2020-08-19 (×2): 800 mg via ORAL
  Filled 2020-08-19 (×2): qty 2

## 2020-08-19 MED ORDER — CALCIUM CARBONATE 1250 (500 CA) MG PO TABS
1000.0000 mg | ORAL_TABLET | Freq: Three times a day (TID) | ORAL | Status: AC
  Administered 2020-08-19 – 2020-08-20 (×5): 1000 mg via ORAL
  Filled 2020-08-19 (×2): qty 1
  Filled 2020-08-19: qty 2
  Filled 2020-08-19 (×2): qty 1
  Filled 2020-08-19: qty 2

## 2020-08-19 MED ORDER — LACTATED RINGERS IV BOLUS
1000.0000 mL | Freq: Once | INTRAVENOUS | Status: AC
  Administered 2020-08-19: 1000 mL via INTRAVENOUS

## 2020-08-19 MED ORDER — NOREPINEPHRINE 4 MG/250ML-% IV SOLN
0.0000 ug/min | INTRAVENOUS | Status: DC
  Filled 2020-08-19: qty 250

## 2020-08-19 MED ORDER — SODIUM CHLORIDE 0.9 % IV BOLUS
500.0000 mL | Freq: Once | INTRAVENOUS | Status: AC
  Administered 2020-08-19: 500 mL via INTRAVENOUS

## 2020-08-19 MED ORDER — SODIUM BICARBONATE 8.4 % IV SOLN
50.0000 meq | Freq: Once | INTRAVENOUS | Status: AC
  Administered 2020-08-19: 50 meq via INTRAVENOUS
  Filled 2020-08-19: qty 50

## 2020-08-19 MED ORDER — STERILE WATER FOR INJECTION IV SOLN
INTRAVENOUS | Status: DC
  Filled 2020-08-19 (×3): qty 850

## 2020-08-19 MED ORDER — LACTATED RINGERS IV BOLUS
500.0000 mL | Freq: Once | INTRAVENOUS | Status: AC
  Administered 2020-08-19: 500 mL via INTRAVENOUS

## 2020-08-19 MED ORDER — MELATONIN 3 MG PO TABS
3.0000 mg | ORAL_TABLET | Freq: Every day | ORAL | Status: DC
  Administered 2020-08-19 – 2020-08-21 (×3): 3 mg via ORAL
  Filled 2020-08-19 (×4): qty 1

## 2020-08-19 MED ORDER — PERFLUTREN LIPID MICROSPHERE
1.0000 mL | INTRAVENOUS | Status: AC | PRN
Start: 2020-08-19 — End: 2020-08-19
  Administered 2020-08-19: 2 mL via INTRAVENOUS
  Filled 2020-08-19: qty 10

## 2020-08-19 MED ORDER — MAGNESIUM SULFATE 4 GM/100ML IV SOLN
4.0000 g | Freq: Two times a day (BID) | INTRAVENOUS | Status: AC
  Administered 2020-08-19 (×2): 4 g via INTRAVENOUS
  Filled 2020-08-19 (×2): qty 100

## 2020-08-19 NOTE — Progress Notes (Signed)
Hypotension persists despite IV fluid boluses.  Received a dose of Midodrine 10 mg.  Consulted PCCM Dr. Orpah Melter and ordered vasopressor Levophed at bedside.  Maintain MAP>65 in the setting of marked AKI with Cr. 5, baseline 0.6.  AG Metabolic acidosis with PH 7.2.  Started isotonic bicarb drip.  Multiple electrolytes abnormalities are being addressed.  Repeat BMP in process, follow results and treat as indicated.

## 2020-08-19 NOTE — Consult Note (Signed)
NAME:  Eddie Taylor, MRN:  [id number], DOB:  Apr 30, 1956, LOS: 1 ADMISSION DATE:  08/18/2020, CONSULTATION DATE: 08-19-20 REFERRING MD:  , Francia Greaves  CHIEF COMPLAINT:  *Hypotension and AKI  Brief History:  This is a 65 year old male with a past medical history of hypertension, peptic ulcer disease, reflux, rheumatoid arthritis, alcohol use disorder, major depressive disorder, and diverticulitis status post colostomy.  Found to have AKI and hypotension in the ED in the setting of intractable nausea vomiting and increased dose of lisinopril.   History of Present Illness:  This is a 65 year old male with a past medical history of hypertension, peptic ulcer disease, reflux, rheumatoid arthritis, alcohol use disorder, major depressive disorder, and diverticulitis status post colostomy.  Patient for the last 2 days has been having issues with nausea and vomiting.  Also with abdominal pain.  Additionally patient has been having low blood pressure.  He has been following his blood pressure at home and this is the lowest it has ever been.  Patient notes that about 4 weeks ago he was asked to increase his dose of lisinopril to 3 pills.  Unsure as to the actual dose that he had been getting.  Patient denies any fevers.  Patient denies any cough.  Patient denies any blood in stool.  No increased stool output from colostomy that he notes.  He did note that he was having some fatigue, and weakness at home also some blurry vision.  He notes that the fatigue is much improved with fluid ministration.  In ED patient was fluid resuscitated with 2 L of LR and 1 L of normal saline also given antibiotics for possible UTI.  Labs pertinent for a creatinine that was elevated at 5.85 potassium that did increase to 6 but later came down to 3.7.  Magnesium  noted to be low at 0.4.  White count initially elevated at 12. 3 came down to 5.5 with fluid administration.  Was also noted to be hypotensive despite fluid administration and  midodrine was started.  Past Medical History:  Hypertension Peptic ulcer disease Reflux Rheumatoid arthritis Alcohol use disorder Diverticulitis  Significant Hospital Events:  Patient admitted to hospitalist  Consults:  PCCM  Procedures:  Not applicable  Significant Diagnostic Tests:  Renal ultrasound with normal appearance of kidneys Chest x-ray reassuring Micro Data:  Blood and urine cultures pending  Antimicrobials:  Ceftriaxone  Interim History / Subjective:  N/A  Objective   Blood pressure (!) 77/63, pulse (!) 117, temperature 98.2 F (36.8 C), temperature source Oral, resp. rate 12, SpO2 99 %.        Intake/Output Summary (Last 24 hours) at 08/19/2020 0718 Last data filed at 08/19/2020 0532 Gross per 24 hour  Intake 2500 ml  Output 1850 ml  Net 650 ml   There were no vitals filed for this visit.  Examination: General: Patient pleasant and sitting up in bed.  Appropriate.  Able to hold full conversations HENT: No scleral icterus Lungs: Clear to auscultation without crackles Cardiovascular: Systolic murmur appreciated regular rate and rhythm.  POCUS performed today IVC with mild dilation but collapsible with inspiration.  Heart appears hyperdynamic.  Ejection fraction appeared appropriate no pericardial effusion Abdomen: Soft  Nondistended.  Colostomy noted.  Some tenderness in the left upper quadrant appreciated patient notes this is where pain was previously with peptic ulcer disease Extremities: No gross deformities.  Tattoos on bilateral lower extremities Neuro: Grossly intact GU: Deferred  Resolved Hospital Problem list   N/A  Assessment & Plan:  This is a 65 year old male with history as noted above.  Presents for chief complaint of nausea vomiting and weakness.  Has been hypotensive  Hypotension-likely secondary to hypovolemia in the setting of previously no nausea vomiting and increased dose of lisinopril.  Patient unable to recollect how much  he had been taking but knows that he had been taking 3 pills whether the true dose of this will be difficult to ascertain.  Lactic acid reassuring at this time.  Renal function improving with fluid administration -Please adjust MAP goal to 60 as patient has been consistently at this goal over the last hour and has been tolerating well and studies such as 60 and down would suggest that this is a safe target -Would recommend echo in a.m. -Please continue to follow cultures -Would obtain CT abdomen-pending findings would consider broadening antibiotics especially as patient is immunocompromised on Humira -would also obtain troponins -Can give another 500 to a liter of fluids without issue -Does not need ICU admission at this time   AKI-etiology unclear.  Seems prerenal and also on mild to moderate dose of lisinopril with hydrochlorothiazide.  He had been taking this hydrochlorothiazide and lisinopril with poor p.o. intake this would be a likely culprit.  Patient with good urine output at this point -Agree with bicarb drip as most recent bicarb of 13 -Patient clearly improving with fluid administration potassium improved dramatically -CT abdomen   Best practice (evaluated daily)  Diet: Renal diet Pain/Anxiety/Delirium protocol (if indicated): Not indicated VAP protocol (if indicated): Not indicated DVT prophylaxis: Heparin subcu GI prophylaxis: Would start home PPI Glucose control: BMPs regularly Mobility: Bedrest for now Disposition: Hospitalist  Goals of Care:   Code Status: Patient wishes to remain full code  Labs   CBC: Recent Labs  Lab 08/18/20 1603 08/18/20 2237 08/19/20 0447  WBC 12.3*  --  5.5  HGB 12.5* 9.9* 9.4*  HCT 35.5* 29.0* 27.7*  MCV 99.2  --  101.8*  PLT 147*  --  87*    Basic Metabolic Panel: Recent Labs  Lab 08/18/20 1603 08/18/20 1731 08/18/20 2132 08/18/20 2237 08/19/20 0447  NA 130* 129* 134* 134* 134*  K 5.0 6.0* 4.5 4.4 3.7  CL 99 100 109  --   106  CO2 13* 13* 13*  --  14*  GLUCOSE 119* 100* 85  --  86  BUN 82* 82* 68*  --  64*  CREATININE 5.85* 5.68* 3.83*  --  2.77*  CALCIUM 7.6* 7.3* 6.1*  --  7.3*  MG  --   --   --   --  0.4*  PHOS  --   --   --   --  4.2   GFR: CrCl cannot be calculated (Unknown ideal weight.). Recent Labs  Lab 08/18/20 1603 08/18/20 1810 08/18/20 2218 08/19/20 0447  WBC 12.3*  --   --  5.5  LATICACIDVEN  --  1.3 1.1  --     Liver Function Tests: Recent Labs  Lab 08/18/20 1731  AST 18  ALT 15  ALKPHOS 40  BILITOT 0.9  PROT 7.8  ALBUMIN 4.0   Recent Labs  Lab 08/18/20 2132  LIPASE 26   No results for input(s): AMMONIA in the last 168 hours.  ABG    Component Value Date/Time   PHART 7.443 11/29/2008 0230   PCO2ART 41.9 11/29/2008 0230   PO2ART 86.9 11/29/2008 0230   HCO3 14.4 (L) 08/18/2020 2237   TCO2 16 (L)  08/18/2020 2237   ACIDBASEDEF 13.0 (H) 08/18/2020 2237   O2SAT 98.0 08/18/2020 2237     Coagulation Profile: Recent Labs  Lab 08/18/20 1731  INR 1.2    Cardiac Enzymes: No results for input(s): CKTOTAL, CKMB, CKMBINDEX, TROPONINI in the last 168 hours.  HbA1C: No results found for: HGBA1C  CBG: No results for input(s): GLUCAP in the last 168 hours.  Review of Systems:   Pertinent positives and negatives per HPI 12 point review of systems performed and unless otherwise stated was negative  Past Medical History:  He,  has a past medical history of Anxiety, Arthritis, Depression, Diverticulitis, Gastric ulcer, Hyperlipidemia, Hypertension, and Syncope and collapse (02/14/2019).   Surgical History:   Past Surgical History:  Procedure Laterality Date  . COLON SURGERY    . COLOSTOMY    . ESOPHAGOGASTRODUODENOSCOPY N/A 03/13/2013   Procedure: ESOPHAGOGASTRODUODENOSCOPY (EGD);  Surgeon: Lear Ng, MD;  Location: [facility] ENDOSCOPY;  Service: Endoscopy;  Laterality: N/A;  . rheumatoid arthritis    . SURGERY SCROTAL / TESTICULAR  2014     Social History:    reports that he has been smoking cigarettes. He has been smoking about 0.25 packs per day. He has never used smokeless tobacco. He reports current alcohol use of about 12.0 standard drinks of alcohol per week. He reports previous drug use. Drug: Cocaine.   Family History:  His family history includes Alcohol abuse in his mother and sister; Brain cancer in his mother; Cirrhosis in his maternal grandfather; Depression in his half-brother, half-sister, mother, and sister; Diverticulitis in his half-brother; Drug abuse in his brother; Heart disease in his maternal grandmother and sister; Hyperlipidemia in his maternal grandfather; Hypertension in his maternal grandfather, mother, and sister.   Allergies Allergies  Allergen Reactions  . Morphine And Related     "i go beside myself, i dont know. Its not pretty."   . Prozac [Fluoxetine Hcl] Itching  . Sulfa Antibiotics Other (See Comments)    Doesn't feel well when taking      Home Medications  Prior to Admission medications   Medication Sig Start Date End Date Taking? Authorizing Provider  allopurinol (ZYLOPRIM) 300 MG tablet Take 300 mg by mouth every evening. 01/17/19  Yes [provider]  baclofen (LIORESAL) 10 MG tablet Take 10 mg by mouth 3 (three) times daily. 12/19/19  Yes [provider]  divalproex (DEPAKOTE ER) 500 MG 24 hr tablet Take 2 tablets (1,000 mg total) by mouth at bedtime. 08/16/20  Yes Hurst, Dorothea Glassman, PA-C  Eszopiclone 3 MG TABS Take 3 mg by mouth at bedtime. 07/30/20  Yes [provider]  famotidine (PEPCID) 20 MG tablet Take 20 mg by mouth 2 (two) times daily. 04/30/20  Yes [provider]  hydrochlorothiazide (HYDRODIURIL) 25 MG tablet Take 25 mg by mouth daily.   Yes [provider]  HYDROcodone-acetaminophen (NORCO) 10-325 MG tablet Take 1 tablet by mouth every 6 (six) hours as needed for moderate pain or severe pain. 08/17/20  Yes [provider]  leflunomide (ARAVA)  20 MG tablet Take 20 mg by mouth daily. 04/18/20  Yes [provider]  lisinopril (ZESTRIL) 40 MG tablet Take 40 mg by mouth daily. 07/26/20  Yes [provider]  metoprolol succinate (TOPROL-XL) 50 MG 24 hr tablet Take 50 mg by mouth daily.  01/02/17  Yes [provider]  pantoprazole (PROTONIX) 40 MG tablet Take 1 tablet (40 mg total) by mouth 2 (two) times daily with a meal.  Patient taking differently: Take 40 mg by mouth 2 (two) times daily. 03/16/13  Yes Thurnell Lose, MD  pravastatin (PRAVACHOL) 40 MG tablet Take 40 mg by mouth every evening. 12/05/16  Yes [provider]  VASCEPA 1 g CAPS Take 2 g by mouth 2 (two) times daily. 12/25/16  Yes [provider]  HUMIRA 40 MG/0.8ML PSKT Inject 40 mg into the muscle every 14 (fourteen) days. 01/03/17   [provider]     Critical care time: 60 minutes

## 2020-08-19 NOTE — ED Notes (Signed)
RN attempted report 

## 2020-08-19 NOTE — Progress Notes (Signed)
  Echocardiogram 2D Echocardiogram has been performed.  Eddie Taylor 08/19/2020, 10:27 AM

## 2020-08-19 NOTE — Progress Notes (Addendum)
PROGRESS NOTE    Eddie Taylor    Code Status: Full Code  LKG:[id number] DOB: 29-Sep-1955 DOA: 08/18/2020 LOS: 1 days  PCP: [name], MD CC:  Chief Complaint  Patient presents with  . Weakness  . Vomiting       Hospital Summary   This is a 65 year old male with past medical history of hypertension, optic ulcer disease, GERD, H. pylori, RA, tobacco and alcohol use disorder, major depressive disorder, irritability and anger disorder on Depakote who presented to the ED on 1/29 with complaints of intractable nausea and vomiting generalized weakness x2 days.  Also noted to have had his Depakote increased to 1000 mg nightly and per H&P, taking lisinopril 40 mg daily.  Patient reported he was also taking ibuprofen 800 mg 3 times daily for the past 8 months for right sided rib fractures that occurred several months ago.  In the ED he was hypotensive (70/37) HR 116, WBC 12.3, Hb 12.5, platelets 147, sodium 129, K6.0, bicarb 13, creatinine 5.85, lactic acid 1.3.  Code sepsis was activated in the ED and he was empirically started on Rocephin.  BP slowly improved with IV fluids which were given in the ED but also given midodrine 10 mg x 2 in the ED.  PCCM was consulted who recommended MAP > 60 and aggressive fluids and did not recommend ICU admission but did recommend echo and CT abdomen pelvis.    A & P   Active Problems:   Peptic ulcer disease   Dehydration   AKI (acute kidney injury) ([place])   Hypotension   Hypomagnesemia   Hypocalcemia   SIRS (systemic inflammatory response syndrome) (HCC)   Intractable nausea and vomiting   Insomnia   High anion gap metabolic acidosis   1. Hypotension likely secondary to hypovolemia and antihypertensives, improving a. BP 70/37 on presentation, initially improved with IV fluids then returned to SBP 70s -80s.  Has been aggressively hydrated today but also received midodrine 10 mg x 2, BP currently 96/61 b. Possibly taking his lisinopril incorrectly.  Per  H&P: taking lisinopril 40 mg twice daily then reduced to 40 mg daily at some point but was prescribed at 5 mg daily?  Most recently took lisinopril 40 mg on 08/17/2020 c. Echo: Unremarkable d. PCCM consulted: Goal MAP> 60 as he is mentating well e. Additional 1 L LR bolus f. Hold further midodrine for now to observe BP changes g. Hold tamsulosin as this can cause hypotension h. Hold lisinopril  2. AKI likely secondary to dehydration, possible overuse of lisinopril and NSAIDs a. Creatinine 5.85 on admission, baseline 0.6 b. Taking ibuprofen 800 mg 3 times daily x7 months c. Continue IV hydration d. Hold lisinopril e. Hold NSAIDs   3. Intractable nausea and vomiting, abdominal pain a. N/V resolved and tolerating crackers  b. Lipase negative c. CT scan with small bowel mesenteric stranding favoring enteritis (maybe viral?) d. Afebrile and resolved leukocytosis e. Continue to moniotor  4. SIRS, unlikely sepsis, likely secondary to nausea/vomiting/hypotension and electrolyte abnormalities a. Though he had tachycardia, hypotension, AKI and leukocytosis on presentation he is afebrile, without lactic acidosis and leukocytosis has resolved since starting IV fluids b. UA negative c. Follow-up blood cultures d. Continue ceftriaxone for today or until cultures result and follow-up in a.m.  5. Hyperkalemia likely secondary to ACE inhibitor, resolved  6. Severe hypomagnesemia a. Magnesium 0.4 on presentation, repleted b. Follow-up  7. Hypocalcemia a. Ionized calcium 0.88, repleted in the ED b. Follow-up  8.  Anion gap metabolic acidosis a. On bicarb drip started in the ED b. Follow-up labs   9. Leukocytosis  Thrombocytopenia  macrocytic anemia a. WBC 12.3->5.5, Hb 12.5->9.4, platelets 147->87  b. likely hemoconcentrated on admission, all lines have decreased with IV fluids c. Continue to monitor for signs of bleeding with history of PUD and heavy NSAID use  10. Insomnia a. Hold  Ambien and requested Lunesta due to AKI b. Melatonin   11. Hypovolemic hyponatremia  12. Urinary retention a. Renal ultrasound without hydronephrosis and only minimally distended urinary bladder b. Likely from dehydration c. Hold Flomax due to hypotension  13. Essential hypertension a. Hold antihypertensives  14. Tobacco/alcohol use a. Nicotine patch  15. History of large bowel obstruction s/p ileostomy  16. History of PUD and H. Pylori a. Has upcoming endoscopy with [name] on 2/8 for Barrett's esophagus.  Discussed case with [name] who does not feel patient requires inpatient scope at this time  75. History of anger/behavioral issues a. Continue Depakote  DVT prophylaxis: heparin injection 5,000 Units Start: 08/18/20 2200   Family Communication: Patient's wife has been updated   Disposition Plan:  Status is: Inpatient  Remains inpatient appropriate because:IV treatments appropriate due to intensity of illness or inability to take PO and Inpatient level of care appropriate due to severity of illness   Dispo:  Patient From: Home  Planned Disposition: Home  Expected discharge date: 08/20/2020  Medically stable for discharge: No             Pressure injury documentation    None  Consultants  PCCM  Procedures  None  Antibiotics   Anti-infectives (From admission, onward)   Start     Dose/Rate Route Frequency Ordered Stop   08/18/20 1745  cefTRIAXone (ROCEPHIN) 1 g in sodium chloride 0.9 % 100 mL IVPB        1 g 200 mL/hr over 30 Minutes Intravenous Every 24 hours 08/18/20 1731          Subjective   Patient seen and examined in the ED with wife at bedside.  He states that he is feeling a bit better than before.  Admits to taking ibuprofen 800 mg 3 times daily since May 2021 for history of rib fractures and pain management.  He does not recall the correct dosing of his lisinopril as reported in the H&P.  He denies any coffee-ground emesis  or hematemesis or noticed GI bleeding or black tarry stools.  Wife states he has an upcoming endoscopy with [name] on 2/8  Nurse states the patient has not been vomiting and is tolerating crackers this afternoon.  No other issues.  Objective   Vitals:   08/19/20 1145 08/19/20 1200 08/19/20 1215 08/19/20 1430  BP: 91/61 106/63 (!) 101/53 96/61  Pulse: 93 91 91 (!) 104  Resp: 13 14 13 14   Temp:      TempSrc:      SpO2: 99% 99% 99% 98%  Weight:      Height:        Intake/Output Summary (Last 24 hours) at 08/19/2020 1519 Last data filed at 08/19/2020 1413 Gross per 24 hour  Intake 3000 ml  Output 2050 ml  Net 950 ml   Filed Weights   08/19/20 0744  Weight: 81.2 kg    Examination:  Physical Exam Vitals and nursing note reviewed.  Constitutional:      Appearance: Normal appearance.  HENT:     Head: Normocephalic and atraumatic.  Eyes:  Conjunctiva/sclera: Conjunctivae normal.  Cardiovascular:     Rate and Rhythm: Normal rate and regular rhythm.  Pulmonary:     Effort: Pulmonary effort is normal.     Breath sounds: Normal breath sounds.  Abdominal:     General: Abdomen is flat.     Palpations: Abdomen is soft.     Tenderness: There is abdominal tenderness in the left lower quadrant.  Musculoskeletal:        General: No swelling or tenderness.  Skin:    Coloration: Skin is not jaundiced or pale.  Neurological:     Mental Status: He is alert. Mental status is at baseline.  Psychiatric:        Mood and Affect: Mood normal.        Behavior: Behavior normal.     Data Reviewed: I have personally reviewed following labs and imaging studies  CBC: Recent Labs  Lab 08/18/20 1603 08/18/20 2237 08/19/20 0447  WBC 12.3*  --  5.5  HGB 12.5* 9.9* 9.4*  HCT 35.5* 29.0* 27.7*  MCV 99.2  --  101.8*  PLT 147*  --  87*   Basic Metabolic Panel: Recent Labs  Lab 08/18/20 1603 08/18/20 1731 08/18/20 2132 08/18/20 2237 08/19/20 0447  NA 130* 129* 134* 134*  134*  K 5.0 6.0* 4.5 4.4 3.7  CL 99 100 109  --  106  CO2 13* 13* 13*  --  14*  GLUCOSE 119* 100* 85  --  86  BUN 82* 82* 68*  --  64*  CREATININE 5.85* 5.68* 3.83*  --  2.77*  CALCIUM 7.6* 7.3* 6.1*  --  7.3*  MG  --   --   --   --  0.4*  PHOS  --   --   --   --  4.2   GFR: Estimated Creatinine Clearance: 30 mL/min (A) (by C-G formula based on SCr of 2.77 mg/dL (H)). Liver Function Tests: Recent Labs  Lab 08/18/20 1731  AST 18  ALT 15  ALKPHOS 40  BILITOT 0.9  PROT 7.8  ALBUMIN 4.0   Recent Labs  Lab 08/18/20 2132  LIPASE 26   No results for input(s): AMMONIA in the last 168 hours. Coagulation Profile: Recent Labs  Lab 08/18/20 1731  INR 1.2   Cardiac Enzymes: No results for input(s): CKTOTAL, CKMB, CKMBINDEX, TROPONINI in the last 168 hours. BNP (last 3 results) No results for input(s): PROBNP in the last 8760 hours. HbA1C: No results for input(s): HGBA1C in the last 72 hours. CBG: No results for input(s): GLUCAP in the last 168 hours. Lipid Profile: No results for input(s): CHOL, HDL, LDLCALC, TRIG, CHOLHDL, LDLDIRECT in the last 72 hours. Thyroid Function Tests: No results for input(s): TSH, T4TOTAL, FREET4, T3FREE, THYROIDAB in the last 72 hours. Anemia Panel: No results for input(s): VITAMINB12, FOLATE, FERRITIN, TIBC, IRON, RETICCTPCT in the last 72 hours. Sepsis Labs: Recent Labs  Lab 08/18/20 1810 08/18/20 2218  LATICACIDVEN 1.3 1.1    Recent Results (from the past 240 hour(s))  SARS Coronavirus 2 by RT PCR (hospital order, performed in [facility] hospital lab) Nasopharyngeal Nasopharyngeal Swab     Status: None   Collection Time: 08/18/20  6:10 PM   Specimen: Nasopharyngeal Swab  Result Value Ref Range Status   SARS Coronavirus 2 NEGATIVE NEGATIVE Final    Comment: (NOTE) SARS-CoV-2 target nucleic acids are NOT DETECTED.  The SARS-CoV-2 RNA is generally detectable in upper and lower respiratory specimens during the acute phase of  infection. The lowest concentration of SARS-CoV-2 viral copies this assay can detect is 250 copies / mL. A negative result does not preclude SARS-CoV-2 infection and should not be used as the sole basis for treatment or other patient management decisions.  A negative result may occur with improper specimen collection / handling, submission of specimen other than nasopharyngeal swab, presence of viral mutation(s) within the areas targeted by this assay, and inadequate number of viral copies (<250 copies / mL). A negative result must be combined with clinical observations, patient history, and epidemiological information.  Fact Sheet for Patients:   StrictlyIdeas.no  Fact Sheet for Healthcare Providers: BankingDealers.co.za  This test is not yet approved or  cleared by the [place] FDA and has been authorized for detection and/or diagnosis of SARS-CoV-2 by FDA under an Emergency Use Authorization (EUA).  This EUA will remain in effect (meaning this test can be used) for the duration of the COVID-19 declaration under Section 564(b)(1) of the Act, 21 U.S.C. section 360bbb-3(b)(1), unless the authorization is terminated or revoked sooner.  Performed at [facility], [place] [address], [place], [place] [address]   Blood Culture (routine x 2)     Status: None (Preliminary result)   Collection Time: 08/18/20  6:10 PM   Specimen: BLOOD  Result Value Ref Range Status   Specimen Description BLOOD RIGHT ANTECUBITAL  Final   Special Requests   Final    BOTTLES DRAWN AEROBIC AND ANAEROBIC Blood Culture results may not be optimal due to an excessive volume of blood received in culture bottles   Culture   Final    NO GROWTH < 12 HOURS Performed at [facility], [place] [address], [place], [place] [address]    Report Status PENDING  Incomplete         Radiology Studies: CT ABDOMEN PELVIS WO CONTRAST  Result Date:  08/19/2020 CLINICAL DATA:  Nausea and vomiting for 2 days.  Sepsis. EXAM: CT ABDOMEN AND PELVIS WITHOUT CONTRAST TECHNIQUE: Multidetector CT imaging of the abdomen and pelvis was performed following the standard protocol without IV contrast. COMPARISON:  01/07/2017 FINDINGS: Lower chest:  Calcified pulmonary nodules. Hepatobiliary: No focal liver abnormality.Cholelithiasis without evidence of cholecystitis. Pancreas: Unremarkable. Spleen: Unremarkable. Adrenals/Urinary Tract: Negative adrenals. No hydronephrosis or stone. Unremarkable bladder. Stomach/Bowel: Colectomy with ileostomy. There is stranding involving the small bowel mesentery diffusely without detected small bowel thickening. Small peristomal hernia which is chronic. No detected bowel obstruction. Vascular/Lymphatic: Atheromatous calcification of the aorta which is extensive. No mass or adenopathy. Reproductive:No acute finding Other: No ascites or pneumoperitoneum. Musculoskeletal: Healing left lower rib fractures. Lumbar spine degeneration with L5-S1 foraminal stenosis. IMPRESSION: 1. Small bowel mesenteric stranding favoring enteritis given the clinical history. No bowel obstruction. 2. Ileostomy with chronic small parastomal hernia. 3.  Aortic Atherosclerosis (ICD10-I70.0). 4. Cholelithiasis without cholecystitis. Electronically Signed   By: Monte Fantasia M.D.   On: 08/19/2020 09:08   US RENAL  Result Date: 08/18/2020 CLINICAL DATA:  Generalized weakness.  Question urinary retention. EXAM: RENAL / URINARY TRACT ULTRASOUND COMPLETE COMPARISON:  Abdominal ultrasound 03/13/2013 FINDINGS: Right Kidney: Renal measurements: 10.9 x 5.0 x 4.7 cm = volume: 132 mL. Echogenicity within normal limits. No mass or hydronephrosis visualized. Left Kidney: Renal measurements: 11.8 x 5.4 x 5.3 cm = volume: 178 mL. Echogenicity within normal limits. No mass or hydronephrosis visualized. Bladder: Only minimally distended. No wall thickening for degree of  distension. Other: None. IMPRESSION: 1. Unremarkable sonographic appearance of the kidneys. 2.  Urinary bladder is only minimally distended. Electronically Signed   By: Keith Rake M.D.   On: 08/18/2020 18:34   DG Chest Port 1 View  Result Date: 08/18/2020 CLINICAL DATA:  Weakness and vomiting.  Possible sepsis. EXAM: PORTABLE CHEST 1 VIEW COMPARISON:  12/02/2008 FINDINGS: Atherosclerotic calcification of the aortic arch. Thoracic spondylosis noted. The lungs appear clear. No blunting of the costophrenic angles. Mild left lower lateral rib deformities likely from old healed fractures. Heart size within normal limits. Incidental calcified granulomas the right lung base. IMPRESSION: 1. No acute findings. A pulmonary cause for sepsis is not identified. 2. Atherosclerotic calcification of the aortic arch. 3. Thoracic spondylosis. Electronically Signed   By: Van Clines M.D.   On: 08/18/2020 17:54   ECHOCARDIOGRAM COMPLETE  Result Date: 08/19/2020    ECHOCARDIOGRAM REPORT   Patient Name:   CATCHER DEHOYOS Date of Exam: 08/19/2020 Medical Rec #:  [id number]     Height:       72.0 in Accession #:    [id number]    Weight:       179.0 lb Date of Birth:  03/12/1956      BSA:          2.032 m Patient Age:    2 years      BP:           81/53 mmHg Patient Gender: M             HR:           91 bpm. Exam Location:  Inpatient Procedure: 2D Echo, Cardiac Doppler, Color Doppler and Intracardiac            Opacification Agent Indications:    Hypotension [[id number]]  History:        Patient has prior history of Echocardiogram examinations, most                 recent 02/15/2019. Risk Factors:Hypertension, Dyslipidemia and                 Current Smoker.  Sonographer:    Vickie Epley RDCS Referring Phys: [id number] Corinne  1. Left ventricular ejection fraction, by estimation, is 70 to 75%. The left ventricle has hyperdynamic function. The left ventricle has no regional wall motion abnormalities. Left  ventricular diastolic parameters are consistent with Grade I diastolic dysfunction (impaired relaxation).  2. Right ventricular systolic function is normal. The right ventricular size is normal.  3. The mitral valve is normal in structure. No evidence of mitral valve regurgitation. No evidence of mitral stenosis.  4. The aortic valve was not well visualized. Aortic valve regurgitation is not visualized. No aortic stenosis is present.  5. The inferior vena cava is normal in size with greater than 50% respiratory variability, suggesting right atrial pressure of 3 mmHg. FINDINGS  Left Ventricle: Left ventricular ejection fraction, by estimation, is 70 to 75%. The left ventricle has hyperdynamic function. The left ventricle has no regional wall motion abnormalities. Definity contrast agent was given IV to delineate the left ventricular endocardial borders. The left ventricular internal cavity size was normal in size. There is no left ventricular hypertrophy. Left ventricular diastolic parameters are consistent with Grade I diastolic dysfunction (impaired relaxation). Right Ventricle: The right ventricular size is normal. No increase in right ventricular wall thickness. Right ventricular systolic function is normal. Left Atrium: Left atrial size was normal in size. Right Atrium: Right atrial size was normal in size. Pericardium: There is  no evidence of pericardial effusion. Mitral Valve: The mitral valve is normal in structure. No evidence of mitral valve regurgitation. No evidence of mitral valve stenosis. Tricuspid Valve: The tricuspid valve is normal in structure. Tricuspid valve regurgitation is not demonstrated. No evidence of tricuspid stenosis. Aortic Valve: The aortic valve was not well visualized. Aortic valve regurgitation is not visualized. No aortic stenosis is present. Pulmonic Valve: The pulmonic valve was normal in structure. Pulmonic valve regurgitation is not visualized. No evidence of pulmonic stenosis.  Aorta: The aortic root is normal in size and structure. Venous: The inferior vena cava is normal in size with greater than 50% respiratory variability, suggesting right atrial pressure of 3 mmHg. IAS/Shunts: No atrial level shunt detected by color flow Doppler.  LEFT VENTRICLE PLAX 2D LVOT diam:     2.10 cm      Diastology LV SV:         98           LV e' medial:    7.33 cm/s LV SV Index:   48           LV E/e' medial:  11.4 LVOT Area:     3.46 cm     LV e' lateral:   8.73 cm/s                             LV E/e' lateral: 9.6  LV Volumes (MOD) LV vol d, MOD A4C: 122.0 ml LV vol s, MOD A4C: 40.4 ml LV SV MOD A4C:     122.0 ml RIGHT VENTRICLE RV S prime:     17.70 cm/s TAPSE (M-mode): 1.9 cm LEFT ATRIUM           Index       RIGHT ATRIUM          Index LA Vol (A4C): 22.3 ml 10.97 ml/m RA Area:     7.78 cm                                   RA Volume:   14.30 ml 7.04 ml/m  AORTIC VALVE LVOT Vmax:   149.00 cm/s LVOT Vmean:  110.000 cm/s LVOT VTI:    0.282 m MITRAL VALVE MV Area (PHT): 4.21 cm    SHUNTS MV Decel Time: 180 msec    Systemic VTI:  0.28 m MV E velocity: 83.90 cm/s  Systemic Diam: 2.10 cm MV A velocity: 93.60 cm/s MV E/A ratio:  0.90 Candee Furbish MD Electronically signed by Candee Furbish MD Signature Date/Time: 08/19/2020/11:21:08 AM    Final         Scheduled Meds: . allopurinol  300 mg Oral QPM  . calcium carbonate  1,000 mg of elemental calcium Oral TID WC  . divalproex  1,000 mg Oral QHS  . famotidine  20 mg Oral Daily  . heparin injection (subcutaneous)  5,000 Units Subcutaneous Q8H  . icosapent Ethyl  2 g Oral BID  . leflunomide  20 mg Oral Daily  . magnesium oxide  800 mg Oral BID  . melatonin  3 mg Oral QHS  . nicotine  14 mg Transdermal Daily  . pantoprazole  40 mg Oral BID WC  . pravastatin  40 mg Oral QPM   Continuous Infusions: . cefTRIAXone (ROCEPHIN)  IV Stopped (08/18/20 2052)  . lactated ringers    . lactated ringers    .  magnesium sulfate bolus IVPB Stopped (08/19/20  0903)  .  sodium bicarbonate (isotonic) infusion in sterile water 125 mL/hr at 08/19/20 0435     Time spent: 60 minutes with over 50% of the time coordinating the patient's care    Harold Hedge, DO [facility]   Call night coverage person covering after 7pm

## 2020-08-19 NOTE — ED Notes (Signed)
Pt sitting up in bed eating breakfast. Pt hands and arms very shaky. Wife at bedside helping to feed him.

## 2020-08-19 NOTE — ED Notes (Signed)
Tele Breakfast order placed 

## 2020-08-19 NOTE — Plan of Care (Signed)

## 2020-08-20 DIAGNOSIS — D696 Thrombocytopenia, unspecified: Secondary | ICD-10-CM

## 2020-08-20 DIAGNOSIS — E860 Dehydration: Secondary | ICD-10-CM | POA: Diagnosis not present

## 2020-08-20 DIAGNOSIS — N179 Acute kidney failure, unspecified: Secondary | ICD-10-CM | POA: Diagnosis not present

## 2020-08-20 LAB — HEMOGLOBIN AND HEMATOCRIT, BLOOD
HCT: 24.7 % — ABNORMAL LOW (ref 39.0–52.0)
HCT: 26.1 % — ABNORMAL LOW (ref 39.0–52.0)
Hemoglobin: 9 g/dL — ABNORMAL LOW (ref 13.0–17.0)
Hemoglobin: 9.3 g/dL — ABNORMAL LOW (ref 13.0–17.0)

## 2020-08-20 LAB — BASIC METABOLIC PANEL
Anion gap: 10 (ref 5–15)
BUN: 33 mg/dL — ABNORMAL HIGH (ref 8–23)
CO2: 29 mmol/L (ref 22–32)
Calcium: 7.5 mg/dL — ABNORMAL LOW (ref 8.9–10.3)
Chloride: 98 mmol/L (ref 98–111)
Creatinine, Ser: 1.05 mg/dL (ref 0.61–1.24)
GFR, Estimated: >60 mL/min (ref >60)
Glucose, Bld: 101 mg/dL — ABNORMAL HIGH (ref 70–99)
Potassium: 3.6 mmol/L (ref 3.5–5.1)
Sodium: 137 mmol/L (ref 135–145)

## 2020-08-20 LAB — TROPONIN I (HIGH SENSITIVITY)
Troponin I (High Sensitivity): 57 ng/L — ABNORMAL HIGH (ref <18)
Troponin I (High Sensitivity): 51 ng/L — ABNORMAL HIGH (ref <18)

## 2020-08-20 LAB — CBC
HCT: 23.9 % — ABNORMAL LOW (ref 39.0–52.0)
Hemoglobin: 8.3 g/dL — ABNORMAL LOW (ref 13.0–17.0)
MCH: 33.5 pg (ref 26.0–34.0)
MCHC: 34.7 g/dL (ref 30.0–36.0)
MCV: 96.4 fL (ref 80.0–100.0)
Platelets: 74 10*3/uL — ABNORMAL LOW (ref 150–400)
RBC: 2.48 MIL/uL — ABNORMAL LOW (ref 4.22–5.81)
RDW: 13.3 % (ref 11.5–15.5)
WBC: 3.7 10*3/uL — ABNORMAL LOW (ref 4.0–10.5)
nRBC: 0 % (ref 0.0–0.2)

## 2020-08-20 LAB — URINE CULTURE

## 2020-08-20 LAB — MAGNESIUM: Magnesium: 1.9 mg/dL (ref 1.7–2.4)

## 2020-08-20 MED ORDER — POTASSIUM CHLORIDE CRYS ER 20 MEQ PO TBCR
20.0000 meq | EXTENDED_RELEASE_TABLET | Freq: Once | ORAL | Status: AC
  Administered 2020-08-20: 20 meq via ORAL
  Filled 2020-08-20: qty 1

## 2020-08-20 NOTE — Evaluation (Signed)
Physical Therapy Evaluation Patient Details Name: Eddie Taylor MRN: [id number] DOB: Dec 04, 1955 Today's Date: 08/20/2020   History of Present Illness  Eddie Taylor is a 65 y.o. male with medical history significant for essential hypertension, peptic ulcer disease, GERD, H. pylori infection, rheumatoid arthritis, tobacco and alcohol use disorder, major depressive disorder, irritability/anger disorder on Depakote who presented to [facility] ED with complaints of intractable nausea vomiting and generalized weakness of 2 days duration. Also took possibly much higher dose of Lisinopril than originally prescribed.  Clinical Impression  Patient presents with decreased mobility due to pain from arthritis, decreased balance and general weakness.  Patient was able to ambulate in hallway with RW and S to minguard A.  Feel he should be able to return home with wife support and likely will not need follow up PT.  Will, however benefit from skilled PT in the acute setting to allow d/c home safely.     Follow Up Recommendations No PT follow up    Equipment Recommendations  None recommended by PT    Recommendations for Other Services       Precautions / Restrictions Precautions Precautions: Fall Precaution Comments: recent fall on snow/ice trying to get wood      Mobility  Bed Mobility Overal bed mobility: Needs Assistance Bed Mobility: Supine to Sit     Supine to sit: Supervision;HOB elevated     General bed mobility comments: assist for lines    Transfers Overall transfer level: Needs assistance Equipment used: None;Rolling walker (2 wheeled) Transfers: Sit to/from Stand Sit to Stand: Supervision;Min guard         General transfer comment: assist for safety with lines up from EOB no device, then from recliner stood to RW with S.  Ambulation/Gait Ambulation/Gait assistance: Supervision;Min guard Gait Distance (Feet): 160 Feet (& 12' no device CGA) Assistive device: Rolling walker (2  wheeled) Gait Pattern/deviations: Step-through pattern;Decreased stride length     General Gait Details: walked in room no device to chair, then with RW in hallway due to mild imbalance as pt reports stiff from being in bed  Stairs            Wheelchair Mobility    Modified Rankin (Stroke Patients Only)       Balance Overall balance assessment: Mild deficits observed, not formally tested                                           Pertinent Vitals/Pain Pain Assessment: 0-10 Pain Score: 9  Pain Location: "everywhere" due to arthritis Pain Descriptors / Indicators: Aching Pain Intervention(s): Monitored during session;Repositioned (increased mobility)    Home Living Family/patient expects to be discharged to:: Private residence Living Arrangements: Spouse/significant other Available Help at Discharge: Family Type of Home: House Home Access: Stairs to enter Entrance Stairs-Rails: Left Entrance Stairs-Number of Steps: 3 Home Layout: One level Home Equipment: Environmental consultant - 2 wheels;Shower seat;Cane - single point      Prior Function Level of Independence: Independent         Comments: reports walks 2 miles a day, does not use assistive device, he is retired from Architect work     Journalist, newspaper        Extremity/Trunk Assessment   Upper Extremity Assessment Upper Extremity Assessment: Generalized weakness (+ tremor)    Lower Extremity Assessment Lower Extremity Assessment: RLE deficits/detail;LLE deficits/detail RLE Deficits /  Details: AROM WFL, strength at least 4/5, tremor throughout UE's/LE's LLE Deficits / Details: AROM WFL, strength at least 4/5, tremor throughout UE's/LE's    Cervical / Trunk Assessment Cervical / Trunk Assessment: Kyphotic  Communication   Communication: No difficulties  Cognition Arousal/Alertness: Awake/alert Behavior During Therapy: WFL for tasks assessed/performed Overall Cognitive Status: Within  Functional Limits for tasks assessed                                 General Comments: became easily agitated when wife arrived then he saw her using his phone      General Comments General comments (skin integrity, edema, etc.): Wife arrived during session asking questions about infection and diuretics (RN made aware)    Exercises     Assessment/Plan    PT Assessment Patient needs continued PT services  PT Problem List Decreased balance;Decreased activity tolerance;Decreased mobility;Decreased knowledge of use of DME;Decreased knowledge of precautions       PT Treatment Interventions DME instruction;Therapeutic activities;Gait training;Patient/family education;Therapeutic exercise;Balance training;Stair training;Functional mobility training    PT Goals (Current goals can be found in the Care Plan section)  Acute Rehab PT Goals Patient Stated Goal: To move around more PT Goal Formulation: With patient Time For Goal Achievement: 09/03/20 Potential to Achieve Goals: Good    Frequency Min 3X/week   Barriers to discharge        Co-evaluation               AM-PAC PT "6 Clicks" Mobility  Outcome Measure Help needed turning from your back to your side while in a flat bed without using bedrails?: None Help needed moving from lying on your back to sitting on the side of a flat bed without using bedrails?: None Help needed moving to and from a bed to a chair (including a wheelchair)?: None Help needed standing up from a chair using your arms (e.g., wheelchair or bedside chair)?: None Help needed to walk in hospital room?: A Little Help needed climbing 3-5 steps with a railing? : A Little 6 Click Score: 22    End of Session   Activity Tolerance: Patient tolerated treatment well Patient left: in chair;with call bell/phone within reach;with family/visitor present Nurse Communication: Mobility status PT Visit Diagnosis: Other abnormalities of gait and mobility  (R26.89)    Time: 8786-7672 PT Time Calculation (min) (ACUTE ONLY): 25 min   Charges:   PT Evaluation $PT Eval Moderate Complexity: 1 Mod PT Treatments $Gait Training: 8-22 mins        Magda Kiel, PT Acute Rehabilitation Services [phone] Office:[phone] 08/20/2020   Reginia Naas 08/20/2020, 1:30 PM

## 2020-08-20 NOTE — Progress Notes (Signed)
PROGRESS NOTE    Eddie Taylor    Code Status: Full Code  [id number] DOB: [birth year] DOA: 08/18/2020 LOS: 2 days  PCP: [name], MD CC:  Chief Complaint  Patient presents with  . Weakness  . Vomiting       Hospital Summary   This is a 65 year old male with past medical history of hypertension, optic ulcer disease, GERD, H. pylori, RA, tobacco and alcohol use disorder, major depressive disorder, irritability and anger disorder on Depakote who presented to the ED on 1/29 with complaints of intractable nausea and vomiting generalized weakness x2 days.  Also noted to have had his Depakote increased to 1000 mg nightly and per H&P, taking lisinopril 40 mg daily.  Patient reported he was also taking ibuprofen 800 mg 3 times daily for the past 8 months for right sided rib fractures that occurred several months ago.  In the ED he was hypotensive (70/37) HR 116, WBC 12.3, Hb 12.5, platelets 147, sodium 129, K6.0, bicarb 13, creatinine 5.85, lactic acid 1.3.  Code sepsis was activated in the ED and he was empirically started on Rocephin.  BP slowly improved with IV fluids which were given in the ED but also given midodrine 10 mg x 2 in the ED.  PCCM was consulted who recommended MAP > 60 and aggressive fluids and did not recommend ICU admission but did recommend echo and CT abdomen pelvis.    A & P   Active Problems:   Peptic ulcer disease   Dehydration   AKI (acute kidney injury) ([place])   Hypotension   Hypomagnesemia   Hypocalcemia   SIRS (systemic inflammatory response syndrome) (HCC)   Intractable nausea and vomiting   Insomnia   High anion gap metabolic acidosis   1. Hypotension likely secondary to hypovolemia and antihypertensives, stable a. BP 70/37 on presentation, initially improved with IV fluids then returned to SBP 70s -80s.  Has been aggressively hydrated but also received midodrine 10 mg x 2 in the ED b. BP currently 117/77 without any midodrine or fluids and is off  antihypertensives c. Possibly taking his lisinopril incorrectly.  Per H&P: taking lisinopril 40 mg twice daily then reduced to 40 mg daily at some point but was prescribed at 5 mg daily?  Most recently took lisinopril 40 mg on 08/17/2020 and patient states that he is taking it daily. Will probably hold lisinopril at discharge due to the confusion d. Echo: Unremarkable e. PCCM consulted in the ED: Goal MAP> 60 as he is mentating well f. Hold tamsulosin as this can cause hypotension g. Hold lisinopril  2. AKI likely secondary to dehydration, possible overuse of lisinopril and NSAIDs, improved a. Creatinine 5.85 on admission -> 1.05 currently, baseline 0.6 b. Reportedly Taking ibuprofen 800 mg 3 times daily x7 months then today mentioned that he is only taking it once weekly c. Hold lisinopril d. Hold NSAIDs   3. Bigeminy a. Noted on telemetry this a.m. b. Replete electrolytes with goal K > 4.0 and magnesium > 2.0 c. EKG and troponin performed  4. Elevated troponin a. Troponin 57 b. Suspect demand ischemia c. Recheck   5. Intractable nausea and vomiting, abdominal pain, resolved a. N/V resolved and tolerating crackers  b. Lipase negative c. CT scan with small bowel mesenteric stranding favoring enteritis (maybe viral?) d. Afebrile and resolved leukocytosis  6. SIRS, unlikely sepsis, likely secondary to nausea/vomiting/hypotension and electrolyte abnormalities, resolved a. SIRS: tachycardia, hypotension, AKI and leukocytosis on presentation  b. On immunosuppression  for RA c. UA negative d. Blood cultures no growth to date e. Hold Humira and Leflunomide f. Discontinue ceftriaxone  7. Hyperkalemia likely secondary to ACE inhibitor, resolved  8. Severe hypomagnesemia, resolved a. Magnesium 0.4 on presentation  9. Hypocalcemia a. Ionized calcium 0.88, repleted in the ED  10. Anion gap metabolic acidosis, resolved a. Off bicarb drip now b. Follow-up labs   11. Thrombocytopenia   a. Platelets trending down 147-> 74 b. No signs of bleeding  c. Hold leflunomide d. Hold heparin e. Follow up in AM   12. Macrocytic anemia in the setting of NSAID use a. No signs of bleeding b. likely a component of hemoconcentration from admission as all lines have decreased with IV fluids c. Continue to monitor for signs of bleeding with history of PUD and heavy NSAID use d. FOBT  13. Insomnia a. Hold Ambien and requested Lunesta due to AKI b. Melatonin   14. Hypovolemic hyponatremia, resolved  15. Urinary retention a. Reported on admission, unlikely actual urinary retention but more likely dehydration b. Renal ultrasound without hydronephrosis and only minimally distended urinary bladder c. Hold Flomax due to hypotension  16. Essential hypertension a. Hold antihypertensives  17. Tobacco/alcohol use a. Nicotine patch  18. History of large bowel obstruction s/p ileostomy  19. History of PUD and H. Pylori a. Has upcoming endoscopy with [name] on 2/8 for Barrett's esophagus.  Discussed case with [name] who does not feel patient requires inpatient scope at this time  20. History of anger/behavioral issues a. Continue Depakote   21. History of RA a. Holding leflunomide and Humira  DVT prophylaxis: Place and maintain sequential compression device Start: 08/20/20 1640   Family Communication: Patient's wife has been updated today  Disposition Plan: hopeful discharge tomorrow pending CBC and clinical stability Status is: Inpatient  Remains inpatient appropriate because:IV treatments appropriate due to intensity of illness or inability to take PO and Inpatient level of care appropriate due to severity of illness   Dispo:  Patient From: Home  Planned Disposition: Home  Expected discharge date: 08/20/2020  Medically stable for discharge: No             Pressure injury documentation    None  Consultants  PCCM  Procedures  None  Antibiotics    Anti-infectives (From admission, onward)   Start     Dose/Rate Route Frequency Ordered Stop   08/18/20 1745  cefTRIAXone (ROCEPHIN) 1 g in sodium chloride 0.9 % 100 mL IVPB  Status:  Discontinued        1 g 200 mL/hr over 30 Minutes Intravenous Every 24 hours 08/18/20 1731 08/20/20 1609        Subjective   Feeling much better today without any complaints.  Denies any black tarry stool or any noticeable bleeding.  Today he states that he only takes ibuprofen 800 mg as needed and typically only about once weekly as opposed to yesterday when he stated he takes it 800 mg 3 times daily for several months.  Denies any chest pain, palpitations or shortness of breath.  No other issues or complaints  Objective   Vitals:   08/20/20 0700 08/20/20 0758 08/20/20 1100 08/20/20 1500  BP: 111/69 107/70 134/73 117/77  Pulse: 81 85 74 72  Resp: 17 14 16 14   Temp:  97.9 F (36.6 C) 97.7 F (36.5 C) 97.7 F (36.5 C)  TempSrc:  Oral Oral Oral  SpO2: 95% 96% 96% 96%  Weight:  Height:        Intake/Output Summary (Last 24 hours) at 08/20/2020 1639 Last data filed at 08/20/2020 1100 Gross per 24 hour  Intake 1454.43 ml  Output 1950 ml  Net -495.57 ml   Filed Weights   08/19/20 0744 08/20/20 0416  Weight: 81.2 kg 83 kg    Examination:  Physical Exam Vitals and nursing note reviewed.  Constitutional:      Appearance: Normal appearance.  HENT:     Head: Normocephalic and atraumatic.  Eyes:     Conjunctiva/sclera: Conjunctivae normal.  Cardiovascular:     Rate and Rhythm: Normal rate and regular rhythm.  Pulmonary:     Effort: Pulmonary effort is normal.     Breath sounds: Normal breath sounds.  Abdominal:     General: Abdomen is flat.     Palpations: Abdomen is soft.     Comments: Ostomy  Musculoskeletal:        General: No swelling or tenderness.  Skin:    Coloration: Skin is not jaundiced or pale.  Neurological:     Mental Status: He is alert. Mental status is at  baseline.  Psychiatric:        Mood and Affect: Mood normal.        Behavior: Behavior normal.     Data Reviewed: I have personally reviewed following labs and imaging studies  CBC: Recent Labs  Lab 08/18/20 1603 08/18/20 2237 08/19/20 0447 08/20/20 0036 08/20/20 0943 08/20/20 1454  WBC 12.3*  --  5.5 3.7*  --   --   HGB 12.5* 9.9* 9.4* 8.3* 9.0* 9.3*  HCT 35.5* 29.0* 27.7* 23.9* 24.7* 26.1*  MCV 99.2  --  101.8* 96.4  --   --   PLT 147*  --  87* 74*  --   --    Basic Metabolic Panel: Recent Labs  Lab 08/18/20 1731 08/18/20 2132 08/18/20 2237 08/19/20 0447 08/19/20 1546 08/20/20 0036  NA 129* 134* 134* 134* 136 137  K 6.0* 4.5 4.4 3.7 3.6 3.6  CL 100 109  --  106 103 98  CO2 13* 13*  --  14* 24 29  GLUCOSE 100* 85  --  86 144* 101*  BUN 82* 68*  --  64* 48* 33*  CREATININE 5.68* 3.83*  --  2.77* 1.59* 1.05  CALCIUM 7.3* 6.1*  --  7.3* 7.7* 7.5*  MG  --   --   --  0.4* 1.2* 1.9  PHOS  --   --   --  4.2  --   --    GFR: Estimated Creatinine Clearance: 79 mL/min (by C-G formula based on SCr of 1.05 mg/dL). Liver Function Tests: Recent Labs  Lab 08/18/20 1731 08/19/20 1546  AST 18 16  ALT 15 11  ALKPHOS 40 30*  BILITOT 0.9 0.9  PROT 7.8 5.8*  ALBUMIN 4.0 2.8*   Recent Labs  Lab 08/18/20 2132  LIPASE 26   No results for input(s): AMMONIA in the last 168 hours. Coagulation Profile: Recent Labs  Lab 08/18/20 1731  INR 1.2   Cardiac Enzymes: No results for input(s): CKTOTAL, CKMB, CKMBINDEX, TROPONINI in the last 168 hours. BNP (last 3 results) No results for input(s): PROBNP in the last 8760 hours. HbA1C: No results for input(s): HGBA1C in the last 72 hours. CBG: No results for input(s): GLUCAP in the last 168 hours. Lipid Profile: No results for input(s): CHOL, HDL, LDLCALC, TRIG, CHOLHDL, LDLDIRECT in the last 72 hours. Thyroid Function Tests: No  results for input(s): TSH, T4TOTAL, FREET4, T3FREE, THYROIDAB in the last 72 hours. Anemia  Panel: No results for input(s): VITAMINB12, FOLATE, FERRITIN, TIBC, IRON, RETICCTPCT in the last 72 hours. Sepsis Labs: Recent Labs  Lab 08/18/20 1810 08/18/20 2218  LATICACIDVEN 1.3 1.1    Recent Results (from the past 240 hour(s))  SARS Coronavirus 2 by RT PCR (hospital order, performed in [facility] hospital lab) Nasopharyngeal Nasopharyngeal Swab     Status: None   Collection Time: 08/18/20  6:10 PM   Specimen: Nasopharyngeal Swab  Result Value Ref Range Status   SARS Coronavirus 2 NEGATIVE NEGATIVE Final    Comment: (NOTE) SARS-CoV-2 target nucleic acids are NOT DETECTED.  The SARS-CoV-2 RNA is generally detectable in upper and lower respiratory specimens during the acute phase of infection. The lowest concentration of SARS-CoV-2 viral copies this assay can detect is 250 copies / mL. A negative result does not preclude SARS-CoV-2 infection and should not be used as the sole basis for treatment or other patient management decisions.  A negative result may occur with improper specimen collection / handling, submission of specimen other than nasopharyngeal swab, presence of viral mutation(s) within the areas targeted by this assay, and inadequate number of viral copies (<250 copies / mL). A negative result must be combined with clinical observations, patient history, and epidemiological information.  Fact Sheet for Patients:   StrictlyIdeas.no  Fact Sheet for Healthcare Providers: BankingDealers.co.za  This test is not yet approved or  cleared by the [place] FDA and has been authorized for detection and/or diagnosis of SARS-CoV-2 by FDA under an Emergency Use Authorization (EUA).  This EUA will remain in effect (meaning this test can be used) for the duration of the COVID-19 declaration under Section 564(b)(1) of the Act, 21 U.S.C. section 360bbb-3(b)(1), unless the authorization is terminated or revoked  sooner.  Performed at [facility], [place] [address], [place], [place] [address]   Blood Culture (routine x 2)     Status: None (Preliminary result)   Collection Time: 08/18/20  6:10 PM   Specimen: BLOOD  Result Value Ref Range Status   Specimen Description BLOOD RIGHT ANTECUBITAL  Final   Special Requests   Final    BOTTLES DRAWN AEROBIC AND ANAEROBIC Blood Culture results may not be optimal due to an excessive volume of blood received in culture bottles   Culture   Final    NO GROWTH 2 DAYS Performed at [facility], [place] [address] [place], [place] [address]    Report Status PENDING  Incomplete  Urine culture     Status: Abnormal   Collection Time: 08/18/20  9:49 PM   Specimen: In/Out Cath Urine  Result Value Ref Range Status   Specimen Description IN/OUT CATH URINE  Final   Special Requests   Final    NONE Performed at [facility], [place] [address] [place], [place] [address]    Culture MULTIPLE ORGANISMS PRESENT, NONE PREDOMINANT (A)  Final   Report Status 08/20/2020 FINAL  Final  MRSA PCR Screening     Status: None   Collection Time: 08/19/20  8:55 PM   Specimen: Nasopharyngeal  Result Value Ref Range Status   MRSA by PCR NEGATIVE NEGATIVE Final    Comment:        The GeneXpert MRSA Assay (FDA approved for NASAL specimens only), is one component of a comprehensive MRSA colonization surveillance program. It is not intended to diagnose MRSA infection nor to  guide or monitor treatment for MRSA infections. Performed at [facility], [place] [address] [place], [place] [address]          Radiology Studies: CT ABDOMEN PELVIS WO CONTRAST  Result Date: 08/19/2020 CLINICAL DATA:  Nausea and vomiting for 2 days.  Sepsis. EXAM: CT ABDOMEN AND PELVIS WITHOUT CONTRAST TECHNIQUE: Multidetector CT imaging of the abdomen and pelvis was performed following the standard protocol without IV contrast. COMPARISON:  01/07/2017 FINDINGS: Lower chest:  Calcified  pulmonary nodules. Hepatobiliary: No focal liver abnormality.Cholelithiasis without evidence of cholecystitis. Pancreas: Unremarkable. Spleen: Unremarkable. Adrenals/Urinary Tract: Negative adrenals. No hydronephrosis or stone. Unremarkable bladder. Stomach/Bowel: Colectomy with ileostomy. There is stranding involving the small bowel mesentery diffusely without detected small bowel thickening. Small peristomal hernia which is chronic. No detected bowel obstruction. Vascular/Lymphatic: Atheromatous calcification of the aorta which is extensive. No mass or adenopathy. Reproductive:No acute finding Other: No ascites or pneumoperitoneum. Musculoskeletal: Healing left lower rib fractures. Lumbar spine degeneration with L5-S1 foraminal stenosis. IMPRESSION: 1. Small bowel mesenteric stranding favoring enteritis given the clinical history. No bowel obstruction. 2. Ileostomy with chronic small parastomal hernia. 3.  Aortic Atherosclerosis (ICD10-I70.0). 4. Cholelithiasis without cholecystitis. Electronically Signed   By: Monte Fantasia M.D.   On: 08/19/2020 09:08   US RENAL  Result Date: 08/18/2020 CLINICAL DATA:  Generalized weakness.  Question urinary retention. EXAM: RENAL / URINARY TRACT ULTRASOUND COMPLETE COMPARISON:  Abdominal ultrasound 03/13/2013 FINDINGS: Right Kidney: Renal measurements: 10.9 x 5.0 x 4.7 cm = volume: 132 mL. Echogenicity within normal limits. No mass or hydronephrosis visualized. Left Kidney: Renal measurements: 11.8 x 5.4 x 5.3 cm = volume: 178 mL. Echogenicity within normal limits. No mass or hydronephrosis visualized. Bladder: Only minimally distended. No wall thickening for degree of distension. Other: None. IMPRESSION: 1. Unremarkable sonographic appearance of the kidneys. 2. Urinary bladder is only minimally distended. Electronically Signed   By: Keith Rake M.D.   On: 08/18/2020 18:34   DG Chest Port 1 View  Result Date: 08/18/2020 CLINICAL DATA:  Weakness and vomiting.   Possible sepsis. EXAM: PORTABLE CHEST 1 VIEW COMPARISON:  12/02/2008 FINDINGS: Atherosclerotic calcification of the aortic arch. Thoracic spondylosis noted. The lungs appear clear. No blunting of the costophrenic angles. Mild left lower lateral rib deformities likely from old healed fractures. Heart size within normal limits. Incidental calcified granulomas the right lung base. IMPRESSION: 1. No acute findings. A pulmonary cause for sepsis is not identified. 2. Atherosclerotic calcification of the aortic arch. 3. Thoracic spondylosis. Electronically Signed   By: Van Clines M.D.   On: 08/18/2020 17:54   ECHOCARDIOGRAM COMPLETE  Result Date: 08/19/2020    ECHOCARDIOGRAM REPORT   Patient Name:   ABIGAIL MARSIGLIA Date of Exam: 08/19/2020 Medical Rec #:  [id number]     Height:       72.0 in Accession #:    [id number]    Weight:       179.0 lb Date of Birth:  November 17, 1955      BSA:          2.032 m Patient Age:    61 years      BP:           81/53 mmHg Patient Gender: M             HR:           91 bpm. Exam Location:  Inpatient Procedure: 2D Echo, Cardiac Doppler, Color Doppler and Intracardiac  Opacification Agent Indications:    Hypotension [[id number]]  History:        Patient has prior history of Echocardiogram examinations, most                 recent 02/15/2019. Risk Factors:Hypertension, Dyslipidemia and                 Current Smoker.  Sonographer:    Vickie Epley RDCS Referring Phys: [id number] [place]  1. Left ventricular ejection fraction, by estimation, is 70 to 75%. The left ventricle has hyperdynamic function. The left ventricle has no regional wall motion abnormalities. Left ventricular diastolic parameters are consistent with Grade I diastolic dysfunction (impaired relaxation).  2. Right ventricular systolic function is normal. The right ventricular size is normal.  3. The mitral valve is normal in structure. No evidence of mitral valve regurgitation. No evidence of mitral stenosis.   4. The aortic valve was not well visualized. Aortic valve regurgitation is not visualized. No aortic stenosis is present.  5. The inferior vena cava is normal in size with greater than 50% respiratory variability, suggesting right atrial pressure of 3 mmHg. FINDINGS  Left Ventricle: Left ventricular ejection fraction, by estimation, is 70 to 75%. The left ventricle has hyperdynamic function. The left ventricle has no regional wall motion abnormalities. Definity contrast agent was given IV to delineate the left ventricular endocardial borders. The left ventricular internal cavity size was normal in size. There is no left ventricular hypertrophy. Left ventricular diastolic parameters are consistent with Grade I diastolic dysfunction (impaired relaxation). Right Ventricle: The right ventricular size is normal. No increase in right ventricular wall thickness. Right ventricular systolic function is normal. Left Atrium: Left atrial size was normal in size. Right Atrium: Right atrial size was normal in size. Pericardium: There is no evidence of pericardial effusion. Mitral Valve: The mitral valve is normal in structure. No evidence of mitral valve regurgitation. No evidence of mitral valve stenosis. Tricuspid Valve: The tricuspid valve is normal in structure. Tricuspid valve regurgitation is not demonstrated. No evidence of tricuspid stenosis. Aortic Valve: The aortic valve was not well visualized. Aortic valve regurgitation is not visualized. No aortic stenosis is present. Pulmonic Valve: The pulmonic valve was normal in structure. Pulmonic valve regurgitation is not visualized. No evidence of pulmonic stenosis. Aorta: The aortic root is normal in size and structure. Venous: The inferior vena cava is normal in size with greater than 50% respiratory variability, suggesting right atrial pressure of 3 mmHg. IAS/Shunts: No atrial level shunt detected by color flow Doppler.  LEFT VENTRICLE PLAX 2D LVOT diam:     2.10 cm       Diastology LV SV:         98           LV e' medial:    7.33 cm/s LV SV Index:   48           LV E/e' medial:  11.4 LVOT Area:     3.46 cm     LV e' lateral:   8.73 cm/s                             LV E/e' lateral: 9.6  LV Volumes (MOD) LV vol d, MOD A4C: 122.0 ml LV vol s, MOD A4C: 40.4 ml LV SV MOD A4C:     122.0 ml RIGHT VENTRICLE RV S prime:     17.70 cm/s TAPSE (  M-mode): 1.9 cm LEFT ATRIUM           Index       RIGHT ATRIUM          Index LA Vol (A4C): 22.3 ml 10.97 ml/m RA Area:     7.78 cm                                   RA Volume:   14.30 ml 7.04 ml/m  AORTIC VALVE LVOT Vmax:   149.00 cm/s LVOT Vmean:  110.000 cm/s LVOT VTI:    0.282 m MITRAL VALVE MV Area (PHT): 4.21 cm    SHUNTS MV Decel Time: 180 msec    Systemic VTI:  0.28 m MV E velocity: 83.90 cm/s  Systemic Diam: 2.10 cm MV A velocity: 93.60 cm/s MV E/A ratio:  0.90 Candee Furbish MD Electronically signed by Candee Furbish MD Signature Date/Time: 08/19/2020/11:21:08 AM    Final         Scheduled Meds: . allopurinol  300 mg Oral QPM  . calcium carbonate  1,000 mg of elemental calcium Oral TID WC  . divalproex  1,000 mg Oral QHS  . famotidine  20 mg Oral Daily  . icosapent Ethyl  2 g Oral BID  . melatonin  3 mg Oral QHS  . nicotine  14 mg Transdermal Daily  . pantoprazole  40 mg Oral BID WC  . pravastatin  40 mg Oral QPM   Continuous Infusions: . lactated ringers    . magnesium sulfate bolus IVPB       Time spent: 26 minutes with over 50% of the time coordinating the patient's care    Harold Hedge, DO [facility]   Call night coverage person covering after 7pm

## 2020-08-21 ENCOUNTER — Telehealth: Payer: Self-pay | Admitting: Oncology

## 2020-08-21 DIAGNOSIS — E860 Dehydration: Secondary | ICD-10-CM | POA: Diagnosis not present

## 2020-08-21 DIAGNOSIS — N179 Acute kidney failure, unspecified: Secondary | ICD-10-CM | POA: Diagnosis not present

## 2020-08-21 DIAGNOSIS — R338 Other retention of urine: Secondary | ICD-10-CM

## 2020-08-21 DIAGNOSIS — R251 Tremor, unspecified: Secondary | ICD-10-CM

## 2020-08-21 DIAGNOSIS — I959 Hypotension, unspecified: Secondary | ICD-10-CM | POA: Diagnosis not present

## 2020-08-21 DIAGNOSIS — D539 Nutritional anemia, unspecified: Secondary | ICD-10-CM

## 2020-08-21 LAB — CBC
HCT: 23.7 % — ABNORMAL LOW (ref 39.0–52.0)
Hemoglobin: 8.4 g/dL — ABNORMAL LOW (ref 13.0–17.0)
MCH: 34.9 pg — ABNORMAL HIGH (ref 26.0–34.0)
MCHC: 35.4 g/dL (ref 30.0–36.0)
MCV: 98.3 fL (ref 80.0–100.0)
Platelets: 74 10*3/uL — ABNORMAL LOW (ref 150–400)
RBC: 2.41 MIL/uL — ABNORMAL LOW (ref 4.22–5.81)
RDW: 13.6 % (ref 11.5–15.5)
WBC: 3.4 10*3/uL — ABNORMAL LOW (ref 4.0–10.5)
nRBC: 0 % (ref 0.0–0.2)

## 2020-08-21 LAB — BASIC METABOLIC PANEL
Anion gap: 9 (ref 5–15)
BUN: 12 mg/dL (ref 8–23)
CO2: 30 mmol/L (ref 22–32)
Calcium: 7.8 mg/dL — ABNORMAL LOW (ref 8.9–10.3)
Chloride: 95 mmol/L — ABNORMAL LOW (ref 98–111)
Creatinine, Ser: 0.8 mg/dL (ref 0.61–1.24)
GFR, Estimated: >60 mL/min (ref >60)
Glucose, Bld: 91 mg/dL (ref 70–99)
Potassium: 4 mmol/L (ref 3.5–5.1)
Sodium: 134 mmol/L — ABNORMAL LOW (ref 135–145)

## 2020-08-21 LAB — CBC WITH DIFFERENTIAL/PLATELET
Abs Immature Granulocytes: 0.01 10*3/uL (ref 0.00–0.07)
Basophils Absolute: 0 10*3/uL (ref 0.0–0.1)
Basophils Relative: 1 %
Eosinophils Absolute: 0.4 10*3/uL (ref 0.0–0.5)
Eosinophils Relative: 13 %
HCT: 27 % — ABNORMAL LOW (ref 39.0–52.0)
Hemoglobin: 9 g/dL — ABNORMAL LOW (ref 13.0–17.0)
Immature Granulocytes: 0 %
Lymphocytes Relative: 43 %
Lymphs Abs: 1.3 10*3/uL (ref 0.7–4.0)
MCH: 33.6 pg (ref 26.0–34.0)
MCHC: 33.3 g/dL (ref 30.0–36.0)
MCV: 100.7 fL — ABNORMAL HIGH (ref 80.0–100.0)
Monocytes Absolute: 0.2 10*3/uL (ref 0.1–1.0)
Monocytes Relative: 6 %
Neutro Abs: 1.2 10*3/uL — ABNORMAL LOW (ref 1.7–7.7)
Neutrophils Relative %: 37 %
Platelets: 73 10*3/uL — ABNORMAL LOW (ref 150–400)
RBC: 2.68 MIL/uL — ABNORMAL LOW (ref 4.22–5.81)
RDW: 13.6 % (ref 11.5–15.5)
WBC: 3.1 10*3/uL — ABNORMAL LOW (ref 4.0–10.5)
nRBC: 0 % (ref 0.0–0.2)

## 2020-08-21 LAB — RETICULOCYTES
Immature Retic Fract: 11.9 % (ref 2.3–15.9)
RBC.: 2.66 MIL/uL — ABNORMAL LOW (ref 4.22–5.81)
Retic Count, Absolute: 25.8 10*3/uL (ref 19.0–186.0)
Retic Ct Pct: 1 % (ref 0.4–3.1)

## 2020-08-21 LAB — CALCIUM, IONIZED
Calcium, Ionized, Serum: 4.1 mg/dL — ABNORMAL LOW (ref 4.5–5.6)
Calcium, Ionized, Serum: 4.3 mg/dL — ABNORMAL LOW (ref 4.5–5.6)

## 2020-08-21 LAB — FOLATE: Folate: 8.3 ng/mL (ref >5.9)

## 2020-08-21 LAB — IRON AND TIBC
Iron: 92 ug/dL (ref 45–182)
Saturation Ratios: 31 % (ref 17.9–39.5)
TIBC: 295 ug/dL (ref 250–450)
UIBC: 203 ug/dL

## 2020-08-21 LAB — GLUCOSE, CAPILLARY: Glucose-Capillary: 98 mg/dL (ref 70–99)

## 2020-08-21 LAB — LACTATE DEHYDROGENASE: LDH: 133 U/L (ref 98–192)

## 2020-08-21 LAB — FERRITIN: Ferritin: 422 ng/mL — ABNORMAL HIGH (ref 24–336)

## 2020-08-21 LAB — VITAMIN B12: Vitamin B-12: 631 pg/mL (ref 180–914)

## 2020-08-21 LAB — DIRECT ANTIGLOBULIN TEST (NOT AT ARMC)
DAT, IgG: NEGATIVE
DAT, complement: NEGATIVE

## 2020-08-21 MED ORDER — TAMSULOSIN HCL 0.4 MG PO CAPS
0.4000 mg | ORAL_CAPSULE | Freq: Every day | ORAL | Status: DC
  Administered 2020-08-21 – 2020-08-22 (×2): 0.4 mg via ORAL
  Filled 2020-08-21 (×2): qty 1

## 2020-08-21 NOTE — Progress Notes (Signed)
PROGRESS NOTE    Eddie Taylor    Code Status: Full Code  [id number] DOB: 1956-04-23 DOA: 08/18/2020 LOS: 3 days  PCP: [name], MD CC:  Chief Complaint  Patient presents with  . Weakness  . Vomiting       Hospital Summary   This is a 65 year old male with past medical history of hypertension, peptic ulcer disease, GERD, H. pylori, RA, tobacco and alcohol use disorder, major depressive disorder, irritability and anger disorder on Depakote, who is a poor historian who presented to the ED on 1/29 with complaints of intractable nausea and vomiting generalized weakness x2 days.  Also noted to have had his Depakote increased to 1000 mg nightly and per H&P, questionably taking Lisinopril correctly (Reported 40 mg bid then qd).  Patient reported he was also taking ibuprofen 800 mg 3 times daily for the past 8 months for right sided rib fractures that occurred several months ago but then later said only on a PRN basis weekly. He did not have diarrhea. In the ED he was hypotensive (70/37) HR 116, WBC 12.3, Hb 12.5, platelets 147, sodium 129, K6.0, bicarb 13, creatinine 5.85, lactic acid 1.3.  Code sepsis was activated in the ED and he was empirically started on Rocephin and a bicarb drip.  BP slowly improved with IV fluids which were given in the ED but also given midodrine 10 mg x 2 in the ED.  PCCM was consulted who recommended MAP > 60 and aggressive fluids and did not recommend ICU admission but did recommend echo and CT abdomen pelvis. Echo was unremarkable and CT with enteritis. Patient had resolution of his electrolyte abnormalities, AKI and hypotension with IV fluids, however he developed thrombocytopenia and macrocytic anemia and was observed overnight for stability.  2/1: Continued with anemia and thrombocytopenia. Discussed with Dr. Jana Hakim, plan for outpatient eval on 2/7 with labs and ok for discharge. Plan was to have patient discharged today however he had recurrence of his urinary  retention prior to DC and required straight cath. He was restarted on flomax  A & P   Active Problems:   Peptic ulcer disease   Dehydration   AKI (acute kidney injury) ([place])   Hypotension   Hypomagnesemia   Hypocalcemia   SIRS (systemic inflammatory response syndrome) (HCC)   Intractable nausea and vomiting   Insomnia   High anion gap metabolic acidosis   1. Hypotension likely secondary to hypovolemia and antihypertensives, resolved a. BP 70/37 on presentation, initially improved with IV fluids then returned to SBP 70s -80s.  Has been aggressively hydrated but also received midodrine 10 mg x 2 in the ED b. Currently normotensive without any midodrine or fluids and is off antihypertensives (HCTZ, Lisinopril, Metoprolol, tamsulosin) c. Possibly taking his lisinopril incorrectly.  Per H&P: taking lisinopril 40 mg twice daily then reduced to 40 mg daily at some point but was prescribed at 5 mg daily?  Most recently took lisinopril 40 mg on 08/17/2020 and patient states that he is taking it daily. Will probably hold lisinopril at discharge due to the confusion  d. Echo: Unremarkable e. Flomax held initially but now with urinary retention and normotensive - restart and monitor BP f. Hold home lisinopril, HCTZ and Metoprolol  2. AKI likely secondary to dehydration, Lisinopril, HCTZ and NSAIDs, resolved a. Creatinine 5.85 on admission ->  0.8 currently, baseline 0.6 b. Reportedly Taking ibuprofen 800 mg 3 times daily x7 months then today mentioned that he is only taking it once  weekly c. Hold lisinopril, HCTZ d. Hold NSAIDs   3. Bigeminy, resolved a. Currently in NSR b. Replete electrolytes with goal K > 4.0 and magnesium > 2.0  4. Elevated troponin a. Troponin 57-> 51 b. Suspect demand ischemia   5. Intractable nausea and vomiting, abdominal pain, resolved a. N/V resolved and tolerating crackers  b. Lipase negative c. CT scan with small bowel mesenteric stranding favoring enteritis  (maybe viral?) d. Afebrile and resolved leukocytosis  6. SIRS, unlikely sepsis, likely secondary to nausea/vomiting/hypotension and electrolyte abnormalities, resolved a. SIRS: tachycardia, hypotension, AKI and leukocytosis on presentation  b. On immunosuppression for RA (Humira and Leflunomide) which is currently on hold c. UA negative d. Blood cultures no growth to date e. Discontinue ceftriaxone yesterday without issue  7. Hyperkalemia likely secondary to ACE inhibitor, resolved  8. Severe hypomagnesemia, resolved a. Magnesium 0.4 on presentation  9. Hypocalcemia, improved a. Ionized calcium 0.88, repleted in the ED  10. Anion gap metabolic acidosis, resolved a. Off bicarb drip now  11. Thrombocytopenia  Macrocytic Anemia of unknown etiology possibly from hemodilution vs medication (Humira, Leflunomide, Depakote) vs. other etiology a. Hb 12.3 -> 9.0, Platelets 147-> 73  b. No signs of bleeding  c. Hold leflunomide and humira for now d. Hold heparin e. Iron studies ordered f. Discussed with Dr. Jana Hakim, heme/onc: LDH, Haptoglobin, Vitamin B12, pathology smear, folic acid, reticulocyte count. Outpatient follow up scheduled  12. Insomnia a. Melatonin   13. Tremor a. Suspect essential tremor vs. From his recently increased depakote b. Depakote level with tomorrow's labs  14. Hypovolemic hyponatremia, resolved  15. Acute Urinary retention a. Reported on admission and was started on flomax with resolution but also could have been due to dehydration on admission. Renal ultrasound without hydronephrosis and only minimally distended urinary bladder. To avoid flomax from contributing to hypotension and since his urinary retention resolved, this was discontinued on 1/31.  b. Recurrent urinary retention today without known history of prostate issues and no history of retention c. Straight catheterized for 800 cc urine today d. Restart flomax and monitor BP e. Q4h bladder scans and  PRN straight cath f. If no improvement tomorrow, he may need a Urology eval  16. Essential hypertension a. Hold antihypertensives for now  17. Tobacco/alcohol use a. Nicotine patch  18. History of large bowel obstruction s/p ileostomy  19. History of PUD and H. Pylori a. Has upcoming endoscopy with [name] on 2/8 for Barrett's esophagus.  Discussed case with [name] who does not feel patient requires inpatient scope at this time  20. History of anger/behavioral issues a. Depakote recently increased from 500 to 1000 mg on 1/27 but did actually start 1000 mg until he was admitted - check a depakote level   21. History of RA a. Holding leflunomide and Humira as above b. Close outpatient rheumatology follow up  DVT prophylaxis: Place and maintain sequential compression device Start: 08/20/20 1640   Family Communication: Patient's wife has been updated today a bedside  Disposition Plan: hopeful discharge tomorrow if he is able to urinate and is clinically stable. He will need to follow up with his PCP for BP checks, rheumatology regarding his RA and hematology on 2/7   Status is: Inpatient  Remains inpatient appropriate because:Unsafe d/c plan and Inpatient level of care appropriate due to severity of illness   Dispo:  Patient From: Home  Planned Disposition: Home  Expected discharge date: 08/22/2020  Medically stable for discharge: No  Pressure injury documentation    None  Consultants  PCCM Discussed with GI Discussed with Hematology  Procedures  None  Antibiotics   Anti-infectives (From admission, onward)   Start     Dose/Rate Route Frequency Ordered Stop   08/18/20 1745  cefTRIAXone (ROCEPHIN) 1 g in sodium chloride 0.9 % 100 mL IVPB  Status:  Discontinued        1 g 200 mL/hr over 30 Minutes Intravenous Every 24 hours 08/18/20 1731 08/20/20 1609        Subjective   This morning the patient was seen at bedside without family  present and resting comfortably and without any complaints. States that he felt better than when he was admitted and was eating well.   Plan was initially to discharge patient however after discussion with patient's wife later today at bedside, the patient was noted to have had urinary retention. Bedside nurse stated that the patient had good UOP yesterday. Bladder scanned at the bedside for 540 cc urine. Patient was unable to urinate at the bedside and he was straight catheterized for 800 cc. Wife also complaining of tremors that are new which the patient did not complain of before.    Objective   Vitals:   08/21/20 0400 08/21/20 0600 08/21/20 0800 08/21/20 1000  BP: 137/78 120/78 109/75 108/87  Pulse: 93 82 64 70  Resp: 18 16 18 13   Temp: [id number] F (36.6 C)  (!) 97.4 F (36.3 C) (!) 97.4 F (36.3 C)  TempSrc: Oral  Oral Oral  SpO2: 95% 94% 95% 95%  Weight: 82.9 kg     Height:        Intake/Output Summary (Last 24 hours) at 08/21/2020 1235 Last data filed at 08/21/2020 0000 Gross per 24 hour  Intake -  Output 600 ml  Net -600 ml   Filed Weights   08/19/20 0744 08/20/20 0416 08/21/20 0400  Weight: 81.2 kg 83 kg 82.9 kg    Examination:  Physical Exam Vitals and nursing note reviewed. Exam conducted with a chaperone present.  Constitutional:      General: He is not in acute distress. HENT:     Head: Normocephalic.     Mouth/Throat:     Mouth: Mucous membranes are moist.  Eyes:     Conjunctiva/sclera: Conjunctivae normal.  Cardiovascular:     Rate and Rhythm: Normal rate and regular rhythm.  Pulmonary:     Effort: Pulmonary effort is normal.     Breath sounds: Normal breath sounds.  Abdominal:     General: Abdomen is flat.     Comments: ostomy  Genitourinary:    Comments: Bladder scan 540 cc Musculoskeletal:        General: No swelling or tenderness.  Skin:    General: Skin is warm.     Coloration: Skin is not jaundiced.  Neurological:     Mental Status: He is  alert.     Comments: Tremor with bringing cup to mouth  Psychiatric:        Mood and Affect: Mood normal.        Behavior: Behavior normal.     Data Reviewed: I have personally reviewed following labs and imaging studies  CBC: Recent Labs  Lab 08/18/20 1603 08/18/20 2237 08/19/20 0447 08/20/20 0036 08/20/20 0943 08/20/20 1454 08/21/20 0108 08/21/20 0821  WBC 12.3*  --  5.5 3.7*  --   --  3.4* 3.1*  NEUTROABS  --   --   --   --   --   --   --  1.2*  HGB 12.5*   < > 9.4* 8.3* 9.0* 9.3* 8.4* 9.0*  HCT 35.5*   < > 27.7* 23.9* 24.7* 26.1* 23.7* 27.0*  MCV 99.2  --  101.8* 96.4  --   --  98.3 100.7*  PLT 147*  --  87* 74*  --   --  74* 73*   < > = values in this interval not displayed.   Basic Metabolic Panel: Recent Labs  Lab 08/18/20 2132 08/18/20 2237 08/19/20 0447 08/19/20 1546 08/20/20 0036 08/21/20 0108  NA 134* 134* 134* 136 137 134*  K 4.5 4.4 3.7 3.6 3.6 4.0  CL 109  --  106 103 98 95*  CO2 13*  --  14* 24 29 30   GLUCOSE 85  --  86 144* 101* 91  BUN 68*  --  64* 48* 33* 12  CREATININE 3.83*  --  2.77* 1.59* 1.05 0.80  CALCIUM 6.1*  --  7.3* 7.7* 7.5* 7.8*  MG  --   --  0.4* 1.2* 1.9  --   PHOS  --   --  4.2  --   --   --    GFR: Estimated Creatinine Clearance: 103.7 mL/min (by C-G formula based on SCr of 0.8 mg/dL). Liver Function Tests: Recent Labs  Lab 08/18/20 1731 08/19/20 1546  AST 18 16  ALT 15 11  ALKPHOS 40 30*  BILITOT 0.9 0.9  PROT 7.8 5.8*  ALBUMIN 4.0 2.8*   Recent Labs  Lab 08/18/20 2132  LIPASE 26   No results for input(s): AMMONIA in the last 168 hours. Coagulation Profile: Recent Labs  Lab 08/18/20 1731  INR 1.2   Cardiac Enzymes: No results for input(s): CKTOTAL, CKMB, CKMBINDEX, TROPONINI in the last 168 hours. BNP (last 3 results) No results for input(s): PROBNP in the last 8760 hours. HbA1C: No results for input(s): HGBA1C in the last 72 hours. CBG: No results for input(s): GLUCAP in the last 168 hours. Lipid  Profile: No results for input(s): CHOL, HDL, LDLCALC, TRIG, CHOLHDL, LDLDIRECT in the last 72 hours. Thyroid Function Tests: No results for input(s): TSH, T4TOTAL, FREET4, T3FREE, THYROIDAB in the last 72 hours. Anemia Panel: Recent Labs    08/21/20 0734 08/21/20 1011  VITAMINB12  --  631  FOLATE  --  8.3  FERRITIN 422*  --   TIBC 295  --   IRON 92  --    Sepsis Labs: Recent Labs  Lab 08/18/20 1810 08/18/20 2218  LATICACIDVEN 1.3 1.1    Recent Results (from the past 240 hour(s))  SARS Coronavirus 2 by RT PCR (hospital order, performed in [facility] hospital lab) Nasopharyngeal Nasopharyngeal Swab     Status: None   Collection Time: 08/18/20  6:10 PM   Specimen: Nasopharyngeal Swab  Result Value Ref Range Status   SARS Coronavirus 2 NEGATIVE NEGATIVE Final    Comment: (NOTE) SARS-CoV-2 target nucleic acids are NOT DETECTED.  The SARS-CoV-2 RNA is generally detectable in upper and lower respiratory specimens during the acute phase of infection. The lowest concentration of SARS-CoV-2 viral copies this assay can detect is 250 copies / mL. A negative result does not preclude SARS-CoV-2 infection and should not be used as the sole basis for treatment or other patient management decisions.  A negative result may occur with improper specimen collection / handling, submission of specimen other than nasopharyngeal swab, presence of viral mutation(s) within the areas targeted by this assay, and inadequate number of viral copies (<250 copies /  mL). A negative result must be combined with clinical observations, patient history, and epidemiological information.  Fact Sheet for Patients:   StrictlyIdeas.no  Fact Sheet for Healthcare Providers: BankingDealers.co.za  This test is not yet approved or  cleared by the [place] FDA and has been authorized for detection and/or diagnosis of SARS-CoV-2 by FDA under an Emergency Use  Authorization (EUA).  This EUA will remain in effect (meaning this test can be used) for the duration of the COVID-19 declaration under Section 564(b)(1) of the Act, 21 U.S.C. section 360bbb-3(b)(1), unless the authorization is terminated or revoked sooner.  Performed at [facility], [place] [address], [place], [place] [address]   Blood Culture (routine x 2)     Status: None (Preliminary result)   Collection Time: 08/18/20  6:10 PM   Specimen: BLOOD  Result Value Ref Range Status   Specimen Description BLOOD RIGHT ANTECUBITAL  Final   Special Requests   Final    BOTTLES DRAWN AEROBIC AND ANAEROBIC Blood Culture results may not be optimal due to an excessive volume of blood received in culture bottles   Culture   Final    NO GROWTH 2 DAYS Performed at [facility], [place] [address] [place], [place] [address]    Report Status PENDING  Incomplete  Urine culture     Status: Abnormal   Collection Time: 08/18/20  9:49 PM   Specimen: In/Out Cath Urine  Result Value Ref Range Status   Specimen Description IN/OUT CATH URINE  Final   Special Requests   Final    NONE Performed at [facility], [place] [address] [place], [place] [address]    Culture MULTIPLE ORGANISMS PRESENT, NONE PREDOMINANT (A)  Final   Report Status 08/20/2020 FINAL  Final  MRSA PCR Screening     Status: None   Collection Time: 08/19/20  8:55 PM   Specimen: Nasopharyngeal  Result Value Ref Range Status   MRSA by PCR NEGATIVE NEGATIVE Final    Comment:        The GeneXpert MRSA Assay (FDA approved for NASAL specimens only), is one component of a comprehensive MRSA colonization surveillance program. It is not intended to diagnose MRSA infection nor to guide or monitor treatment for MRSA infections. Performed at [facility], [place] [address], [place], [place] [address]          Radiology Studies: No results found.      Scheduled Meds: . allopurinol  300 mg Oral QPM  . divalproex   1,000 mg Oral QHS  . famotidine  20 mg Oral Daily  . icosapent Ethyl  2 g Oral BID  . melatonin  3 mg Oral QHS  . nicotine  14 mg Transdermal Daily  . pantoprazole  40 mg Oral BID WC  . pravastatin  40 mg Oral QPM  . tamsulosin  0.4 mg Oral Daily   Continuous Infusions: . lactated ringers    . magnesium sulfate bolus IVPB       Time spent: 45 minutes with over 50% of the time coordinating the patient's care    Harold Hedge, DO [facility]   Call night coverage person covering after 7pm

## 2020-08-21 NOTE — Telephone Encounter (Signed)
Received a new pt referral from Dr. Neysa Bonito, from the hospital, for thrombocytopenia and anemia. Per email from Dr. Jana Hakim, Eddie Taylor has been scheduled to see hi on 2/7 at 230pm w/labs at 2pm. Letter mailed to the pt.

## 2020-08-22 DIAGNOSIS — E872 Acidosis: Secondary | ICD-10-CM | POA: Diagnosis not present

## 2020-08-22 DIAGNOSIS — I10 Essential (primary) hypertension: Secondary | ICD-10-CM | POA: Diagnosis present

## 2020-08-22 DIAGNOSIS — R338 Other retention of urine: Secondary | ICD-10-CM | POA: Diagnosis not present

## 2020-08-22 DIAGNOSIS — I700 Atherosclerosis of aorta: Secondary | ICD-10-CM | POA: Diagnosis present

## 2020-08-22 DIAGNOSIS — N179 Acute kidney failure, unspecified: Secondary | ICD-10-CM | POA: Diagnosis not present

## 2020-08-22 DIAGNOSIS — I498 Other specified cardiac arrhythmias: Secondary | ICD-10-CM

## 2020-08-22 DIAGNOSIS — E871 Hypo-osmolality and hyponatremia: Secondary | ICD-10-CM | POA: Diagnosis present

## 2020-08-22 DIAGNOSIS — E875 Hyperkalemia: Secondary | ICD-10-CM | POA: Diagnosis present

## 2020-08-22 DIAGNOSIS — E860 Dehydration: Secondary | ICD-10-CM | POA: Diagnosis not present

## 2020-08-22 DIAGNOSIS — Z720 Tobacco use: Secondary | ICD-10-CM | POA: Diagnosis present

## 2020-08-22 DIAGNOSIS — I248 Other forms of acute ischemic heart disease: Secondary | ICD-10-CM | POA: Diagnosis present

## 2020-08-22 DIAGNOSIS — Z932 Ileostomy status: Secondary | ICD-10-CM

## 2020-08-22 DIAGNOSIS — M069 Rheumatoid arthritis, unspecified: Secondary | ICD-10-CM

## 2020-08-22 DIAGNOSIS — K435 Parastomal hernia without obstruction or  gangrene: Secondary | ICD-10-CM | POA: Diagnosis present

## 2020-08-22 DIAGNOSIS — A419 Sepsis, unspecified organism: Secondary | ICD-10-CM | POA: Diagnosis present

## 2020-08-22 DIAGNOSIS — R251 Tremor, unspecified: Secondary | ICD-10-CM | POA: Diagnosis present

## 2020-08-22 DIAGNOSIS — R6520 Severe sepsis without septic shock: Secondary | ICD-10-CM | POA: Diagnosis present

## 2020-08-22 LAB — BASIC METABOLIC PANEL
Anion gap: 10 (ref 5–15)
BUN: 8 mg/dL (ref 8–23)
CO2: 27 mmol/L (ref 22–32)
Calcium: 7.7 mg/dL — ABNORMAL LOW (ref 8.9–10.3)
Chloride: 94 mmol/L — ABNORMAL LOW (ref 98–111)
Creatinine, Ser: 0.79 mg/dL (ref 0.61–1.24)
GFR, Estimated: >60 mL/min (ref >60)
Glucose, Bld: 92 mg/dL (ref 70–99)
Potassium: 4.6 mmol/L (ref 3.5–5.1)
Sodium: 131 mmol/L — ABNORMAL LOW (ref 135–145)

## 2020-08-22 LAB — VALPROIC ACID LEVEL: Valproic Acid Lvl: 26 ug/mL — ABNORMAL LOW (ref 50.0–100.0)

## 2020-08-22 LAB — CBC
HCT: 26.5 % — ABNORMAL LOW (ref 39.0–52.0)
Hemoglobin: 8.8 g/dL — ABNORMAL LOW (ref 13.0–17.0)
MCH: 33.3 pg (ref 26.0–34.0)
MCHC: 33.2 g/dL (ref 30.0–36.0)
MCV: 100.4 fL — ABNORMAL HIGH (ref 80.0–100.0)
Platelets: 87 10*3/uL — ABNORMAL LOW (ref 150–400)
RBC: 2.64 MIL/uL — ABNORMAL LOW (ref 4.22–5.81)
RDW: 13.2 % (ref 11.5–15.5)
WBC: 3.6 10*3/uL — ABNORMAL LOW (ref 4.0–10.5)
nRBC: 0 % (ref 0.0–0.2)

## 2020-08-22 LAB — HAPTOGLOBIN: Haptoglobin: 200 mg/dL (ref 32–363)

## 2020-08-22 LAB — MAGNESIUM: Magnesium: 1 mg/dL — ABNORMAL LOW (ref 1.7–2.4)

## 2020-08-22 MED ORDER — MAGNESIUM SULFATE 4 GM/100ML IV SOLN
4.0000 g | Freq: Once | INTRAVENOUS | Status: AC
  Administered 2020-08-22: 4 g via INTRAVENOUS
  Filled 2020-08-22: qty 100

## 2020-08-22 MED ORDER — PANTOPRAZOLE SODIUM 40 MG PO TBEC: 40.0000 mg | DELAYED_RELEASE_TABLET | Freq: Two times a day (BID) | ORAL | Status: DC

## 2020-08-22 MED ORDER — MAGNESIUM OXIDE -MG SUPPLEMENT 400 (240 MG) MG PO TABS: 400.0000 mg | ORAL_TABLET | Freq: Two times a day (BID) | ORAL | 0 refills | Status: AC

## 2020-08-22 NOTE — Discharge Instructions (Signed)
Acute Kidney Injury, Adult  Acute kidney injury is a sudden worsening of kidney function. The kidneys are organs that have several jobs. They filter the blood to remove waste products and extra fluid. They also maintain a healthy balance of minerals and hormones in the body, which helps control blood pressure and keep bones strong. With this condition, your kidneys do not do their jobs as well as they should. This condition ranges from mild to severe. Over time, it may develop into long-lasting (chronic) kidney disease. Early detection and treatment may prevent acute kidney injury from developing into a chronic condition. What are the causes? Common causes of this condition include:  A problem with blood flow to the kidneys. This may be caused by: ? Low blood pressure (hypotension) or shock. ? Blood loss. ? Heart and blood vessel (cardiovascular) disease. ? Severe burns. ? Liver disease.  Direct damage to the kidneys. This may be caused by: ? Certain medicines. ? A kidney infection. ? Poisoning. ? Being around or in contact with toxic substances. ? A surgical wound. ? A hard, direct hit to the kidney area.  A sudden blockage of urine flow. This may be caused by: ? Cancer. ? Kidney stones. ? An enlarged prostate in males. What increases the risk? You are more likely to develop this condition if you:  Are older than age 65.  Are male.  Are hospitalized, especially if you are in critical condition.  Have certain conditions, such as: ? Chronic kidney disease. ? Diabetes. ? Coronary artery disease and heart failure. ? Pulmonary disease. ? Chronic liver disease. What are the signs or symptoms? Symptoms of this condition may not be obvious until the condition becomes severe. Symptoms of this condition can include:  Tiredness (lethargy) or difficulty staying awake.  Nausea or vomiting.  Swelling (edema) of the face, legs, ankles, or feet.  Problems with urination, such  as: ? Pain in the abdomen, or pain along the side of your stomach (flank). ? Producing little or no urine. ? Passing urine with a weak flow.  Muscle twitches and cramps, especially in the legs.  Confusion or trouble concentrating.  Loss of appetite.  Fever. How is this diagnosed? Your health care provider can diagnose this condition based on your symptoms, medical history, and a physical exam.  You may also have other tests, such as:  Blood tests.  Urine tests.  Imaging tests.  A test in which a sample of tissue is removed from the kidneys to be examined under a microscope (kidney biopsy). How is this treated? Treatment for this condition depends on the cause and how severe the condition is. In mild cases, treatment may not be needed. The kidneys may heal on their own. In more severe cases, treatment will involve:  Treating the cause of the kidney injury. This may involve changing any medicines you are taking or adjusting your dosage.  Fluids. You may need specialized IV fluids to balance your body's needs.  Having a catheter placed to drain urine and prevent blockages.  Preventing problems from occurring. This may mean avoiding certain medicines or procedures that can cause further injury to the kidneys. In some cases, treatment may also require:  A procedure to remove toxic wastes from the body (dialysis or continuous renal replacement therapy, CRRT).  Surgery. This may be done to repair a torn kidney or to remove the blockage from the urinary system. Follow these instructions at home: Medicines  Take over-the-counter and prescription medicines only as   told by your health care provider.  Do not take any new medicines without your health care provider's approval. Many medicines can worsen your kidney damage.  Do not take any vitamin and mineral supplements without your health care provider's approval. Many nutritional supplements can worsen your kidney  damage. Lifestyle  If your health care provider prescribed changes to your diet, follow them. You may need to decrease the amount of protein you eat.  Achieve and maintain a healthy weight. If you need help with this, ask your health care provider.  Start or continue an exercise plan. Try to exercise at least 30 minutes a day, 5 days a week.  Do not use any products that contain nicotine or tobacco, such as cigarettes, e-cigarettes, and chewing tobacco. If you need help quitting, ask your health care provider.   General instructions  Keep track of your blood pressure. Report changes in your blood pressure as told by your health care provider.  Stay up to date with your vaccines. Ask your health care provider which vaccines you need.  Keep all follow-up visits as told by your health care provider. This is important.   Where to find more information  American Association of Kidney Patients: www.aakp.org  National Kidney Foundation: www.kidney.org  American Kidney Fund: www.akfinc.org  Life Options Rehabilitation Program: ? www.lifeoptions.org ? www.kidneyschool.org Contact a health care provider if:  Your symptoms get worse.  You develop new symptoms. Get help right away if:  You develop symptoms of worsening kidney disease, which include: ? Headaches. ? Abnormally dark or light skin. ? Easy bruising. ? Frequent hiccups. ? Chest pain. ? Shortness of breath. ? End of menstruation in women. ? Seizures. ? Confusion or altered mental status. ? Abdominal or back pain. ? Itchiness.  You have a fever.  Your body is producing less urine.  You have pain or bleeding when you urinate. Summary  Acute kidney injury is a sudden worsening of kidney function.  Acute kidney injury can be caused by problems with blood flow to the kidneys, direct damage to the kidneys, and sudden blockage of urine flow.  Symptoms of this condition may not be obvious until it becomes severe.  Symptoms may include edema, lethargy, confusion, nausea or vomiting, and problems passing urine.  This condition can be diagnosed with blood tests, urine tests, and imaging tests. Sometimes a kidney biopsy is done to diagnose this condition.  Treatment for this condition often involves treating the underlying cause. It is treated with fluids, medicines, diet changes, dialysis, or surgery. This information is not intended to replace advice given to you by your health care provider. Make sure you discuss any questions you have with your health care provider. Document Revised: 05/17/2019 Document Reviewed: 05/17/2019 Elsevier Patient Education  2021 [employer]  

## 2020-08-22 NOTE — Discharge Summary (Addendum)
Physician Discharge Summary  Eddie Taylor RCV:[id number] DOB: [birth year] DOA: 08/18/2020  PCP: Eddie Johns, MD  Admit date: 08/18/2020 Discharge date: 08/22/2020  Admitted From: Home Discharge disposition: Home  30-day re-admission risk score:  30 Day Unplanned Readmission Risk Score   Flowsheet Row ED to Hosp-Admission (Current) from 08/18/2020 in [facility]  30 Day Unplanned Readmission Risk Score (%) 15.99 Filed at 08/22/2020 0801     This score is the patient's risk of an unplanned readmission within 30 days of being discharged (0 -100%). The score is based on dignosis, age, lab data, medications, orders, and past utilization.   Low:  0-14.9   Medium: 15-21.9   High: 22-29.9   Extreme: 30 and above         Recommendations for Outpatient Follow-Up:   1. Please check BMET and Mg at f/u appointment.  Placed on Magnesium supplementation x 2 weeks due to persistently low Magnesium levels. 2. F/U with Dr. Jana Taylor for anemia/thrombocytopenia evaluation. HHumira and leflunomide held at discharge. 3. Reinforce need to avoid excessive use of NSAIDS.   Discharge Diagnosis:   Principal Problem:   Severe sepsis ([place]) with hypotension, AKI secondary to enteritis (intractible nausea/vomiting, abdominal pain), metabolic acidosis Active Problems:   Peptic ulcer disease   Pancytopenia (HCC)   Dehydration   AKI (acute kidney injury) ([facility])   Hypotension   Hypomagnesemia   Hypocalcemia   Intractable nausea and vomiting   Insomnia   High anion gap metabolic acidosis   Bigeminy   Demand ischemia (HCC)   Aortic atherosclerosis (HCC)   Hyperkalemia   Tremor   Hyponatremia   Acute urinary retention   Essential hypertension   Tobacco abuse   Ileostomy in place [facility])   Parastomal hernia   Rheumatoid arthritis (Eddie Taylor) on chronic immunosuppressives   Discharge Condition: Improved.  Diet recommendation: Low sodium, heart healthy.    Wound care: None.  Code  status: Full.   History of Present Illness:   This is a 65 year old male with past medical history of hypertension on lisinopril/HCTZ, peptic ulcer disease, GERD, H. pylori, RA on Humira, tobacco and alcohol use disorder, major depressive disorder, irritability and anger disorder on Depakote, who presented to the ED for evaluation of generalized weakness, nausea/vomiting, blurred vision associated with hypotension (BP of 70/37) and tachycardia (HR 116) on arrival. Labs notable for sodium 129, potassium 6.0, bicarb 13, creatinine 5.85. Code sepsis was activated in the ED and he was empirically started on Rocephin and a bicarb drip.  BP slowly improved with IV fluids which were given in the ED but also given midodrine 10 mg x 2 in the ED.  PCCM was consulted who recommended MAP > 60 and aggressive fluids and did not recommend ICU admission but did recommend echo and CT abdomen pelvis. Echo was unremarkable and CT consistent with enteritis. Patient had resolution of his electrolyte abnormalities, AKI and hypotension with IV fluids, however he developed thrombocytopenia and macrocytic anemia which will be worked up as an outpatient.  Hospital Course by Problem:   Principal problem: Severe sepsis with hypotension, AKI secondary to enteritis (intractible nausea/voming, abdominal pain), metabolic acidosis POA High suspicion for sepsis given leukocytosis and immunosuppressive treatment for RA. BP 70/37 on presentation, initially improved with IV fluids then returned to SBP 70s -80s.  Required aggressive hydration and midodrine 10 mg x 2 in the ED. Placed on empiric antibiotics with Rocephin through 08/21/20, and required a bicarb drip for severe metabolic acidosis,  likely from bicarb loss due enteritis. CT showed small bowel mesenteric stranding. Likely viral in etiology, and responded well to supportive care.   Active problems: AKI likely secondary to sepsis/dehydration in the setting of enteritis, POA,  resolved Creatinine markedly elevated to 5.85 on admission ->  0.8 currently, baseline 0.6. Nephrotoxic medications held. OK to resume lisinopril/HCTZ at discharge.  Bigeminy, resolved Likely triggered from electrolyte derangements. Currently in NSR. Persistently hypomagnesemic, given 4 grams of MgSO4 on date of discharge and discharged with a prescription for supplements.  Demand ischemia/Elevated troponin, aortic atherosclerosis POA Troponin 57-> 51, consistent with demand ischemia in the setting of hypotension/tachycardia. No further work up needed.  Hyperkalemia likely secondary to AKI(/ACE inhibitor, resolved  Severe hypomagnesemia, ongoing Supplemented with 4 grams MgSO4 prior to discharge and given a prescription for supplements at discharge.  Hypocalcemia, improved Ionized calcium 0.88, repleted in the ED.  Pancytopenia of unknown etiology, POA but worsening  Broad differential, possibly from hemodilution vs medication (Humira, Leflunomide, Depakote) vs. other etiology. Hb 12.3 -> 9.0->8.8, Platelets 147-> 73->89. WBC also low at 3.6.  Case discussed with Dr. Jana Taylor, heme/onc: LDH, Haptoglobin, Vitamin B12, pathology smear, folic acid, reticulocyte count. Outpatient follow up scheduled. Hold Humira and leflunomide at discharge.  Insomnia, chronic On Eszopiclone.   Tremor Suspect essential tremor vs. from his recently increased depakote. Depakote levels low, OK to resume pre-admission dose.  Hypovolemic hyponatremia, improved. Mild hyponatremia persists, recheck BMET in 1 week.  Acute Urinary retention, resolved Continue flomax. Resolved.  Essential hypertension, chronic OK to antihypertensives at discharge.  Tobacco/alcohol use, POA Nicotine patch provided.  History of large bowel obstruction s/p ileostomy/parastomal hernia  History of PUD and H. Pylori Has upcoming endoscopy with Dr. Michail Taylor on 08/28/18 for Barrett's esophagus.  Discussed case with  Dr. Michail Taylor who does not feel patient requires inpatient scope at this time  History of anger/behavioral issues, chronic Continue Depakote.  History of RA on chronic immunosuppressives, chronic Holding leflunomide and Humira as above. Recommend close outpatient rheumatology follow up.   Medical Consultants:    Critical care   Discharge Exam:   Vitals:   08/22/20 0804 08/22/20 1100  BP: (!) 150/92 (!) 150/101  Pulse: 90 81  Resp: 12   Temp: 97.9 F (36.6 C) 98.1 F (36.7 C)  SpO2: 94%    Vitals:   08/22/20 0445 08/22/20 0454 08/22/20 0804 08/22/20 1100  BP: (!) 136/94  (!) 150/92 (!) 150/101  Pulse: 69  90 81  Resp: 11  12   Temp: 97.9 F (36.6 C)  97.9 F (36.6 C) 98.1 F (36.7 C)  TempSrc: Oral  Oral Oral  SpO2: 94%  94%   Weight:  80.2 kg    Height:        General exam: Appears calm and comfortable.  Respiratory system: Clear to auscultation. Respiratory effort normal. Cardiovascular system: S1 & S2 heard, RRR. No JVD,  rubs, gallops or clicks. No murmurs. Gastrointestinal system: Abdomen is nondistended, soft and nontender. No organomegaly or masses felt. Normal bowel sounds heard. Diverting ostomy lower abdomen. Central nervous system: Alert and oriented. No focal neurological deficits. Extremities: No clubbing,  or cyanosis. No edema. Skin: No rashes, lesions or ulcers. Psychiatry: Judgement and insight appear normal. Mood & affect appropriate.    The results of significant diagnostics from this hospitalization (including imaging, microbiology, ancillary and laboratory) are listed below for reference.     Procedures and Diagnostic Studies:   CT ABDOMEN PELVIS WO CONTRAST  Result Date:  08/19/2020 CLINICAL DATA:  Nausea and vomiting for 2 days.  Sepsis. EXAM: CT ABDOMEN AND PELVIS WITHOUT CONTRAST TECHNIQUE: Multidetector CT imaging of the abdomen and pelvis was performed following the standard protocol without IV contrast. COMPARISON:  01/07/2017  FINDINGS: Lower chest:  Calcified pulmonary nodules. Hepatobiliary: No focal liver abnormality.Cholelithiasis without evidence of cholecystitis. Pancreas: Unremarkable. Spleen: Unremarkable. Adrenals/Urinary Tract: Negative adrenals. No hydronephrosis or stone. Unremarkable bladder. Stomach/Bowel: Colectomy with ileostomy. There is stranding involving the small bowel mesentery diffusely without detected small bowel thickening. Small peristomal hernia which is chronic. No detected bowel obstruction. Vascular/Lymphatic: Atheromatous calcification of the aorta which is extensive. No mass or adenopathy. Reproductive:No acute finding Other: No ascites or pneumoperitoneum. Musculoskeletal: Healing left lower rib fractures. Lumbar spine degeneration with L5-S1 foraminal stenosis. IMPRESSION: 1. Small bowel mesenteric stranding favoring enteritis given the clinical history. No bowel obstruction. 2. Ileostomy with chronic small parastomal hernia. 3.  Aortic Atherosclerosis (ICD10-I70.0). 4. Cholelithiasis without cholecystitis. Electronically Signed   By: Monte Fantasia M.D.   On: 08/19/2020 09:08   US RENAL  Result Date: 08/18/2020 CLINICAL DATA:  Generalized weakness.  Question urinary retention. EXAM: RENAL / URINARY TRACT ULTRASOUND COMPLETE COMPARISON:  Abdominal ultrasound 03/13/2013 FINDINGS: Right Kidney: Renal measurements: 10.9 x 5.0 x 4.7 cm = volume: 132 mL. Echogenicity within normal limits. No mass or hydronephrosis visualized. Left Kidney: Renal measurements: 11.8 x 5.4 x 5.3 cm = volume: 178 mL. Echogenicity within normal limits. No mass or hydronephrosis visualized. Bladder: Only minimally distended. No wall thickening for degree of distension. Other: None. IMPRESSION: 1. Unremarkable sonographic appearance of the kidneys. 2. Urinary bladder is only minimally distended. Electronically Signed   By: Keith Rake M.D.   On: 08/18/2020 18:34   DG Chest Port 1 View  Result Date: 08/18/2020 CLINICAL  DATA:  Weakness and vomiting.  Possible sepsis. EXAM: PORTABLE CHEST 1 VIEW COMPARISON:  12/02/2008 FINDINGS: Atherosclerotic calcification of the aortic arch. Thoracic spondylosis noted. The lungs appear clear. No blunting of the costophrenic angles. Mild left lower lateral rib deformities likely from old healed fractures. Heart size within normal limits. Incidental calcified granulomas the right lung base. IMPRESSION: 1. No acute findings. A pulmonary cause for sepsis is not identified. 2. Atherosclerotic calcification of the aortic arch. 3. Thoracic spondylosis. Electronically Signed   By: Van Clines M.D.   On: 08/18/2020 17:54   ECHOCARDIOGRAM COMPLETE  Result Date: 08/19/2020    ECHOCARDIOGRAM REPORT   Patient Name:   Eddie Taylor Date of Exam: 08/19/2020 Medical Rec #:  [id number]     Height:       72.0 in Accession #:    [id number]    Weight:       179.0 lb Date of Birth:  [birth year]      BSA:          2.032 m Patient Age:    51 years      BP:           81/53 mmHg Patient Gender: M             HR:           91 bpm. Exam Location:  Inpatient Procedure: 2D Echo, Cardiac Doppler, Color Doppler and Intracardiac            Opacification Agent Indications:    Hypotension [id number]  History:        Patient has prior history of Echocardiogram examinations, most  recent 02/15/2019. Risk Factors:Hypertension, Dyslipidemia and                 Current Smoker.  Sonographer:    Vickie Epley RDCS Referring Phys: [id number] [place]  1. Left ventricular ejection fraction, by estimation, is 70 to 75%. The left ventricle has hyperdynamic function. The left ventricle has no regional wall motion abnormalities. Left ventricular diastolic parameters are consistent with Grade I diastolic dysfunction (impaired relaxation).  2. Right ventricular systolic function is normal. The right ventricular size is normal.  3. The mitral valve is normal in structure. No evidence of mitral valve regurgitation.  No evidence of mitral stenosis.  4. The aortic valve was not well visualized. Aortic valve regurgitation is not visualized. No aortic stenosis is present.  5. The inferior vena cava is normal in size with greater than 50% respiratory variability, suggesting right atrial pressure of 3 mmHg. FINDINGS  Left Ventricle: Left ventricular ejection fraction, by estimation, is 70 to 75%. The left ventricle has hyperdynamic function. The left ventricle has no regional wall motion abnormalities. Definity contrast agent was given IV to delineate the left ventricular endocardial borders. The left ventricular internal cavity size was normal in size. There is no left ventricular hypertrophy. Left ventricular diastolic parameters are consistent with Grade I diastolic dysfunction (impaired relaxation). Right Ventricle: The right ventricular size is normal. No increase in right ventricular wall thickness. Right ventricular systolic function is normal. Left Atrium: Left atrial size was normal in size. Right Atrium: Right atrial size was normal in size. Pericardium: There is no evidence of pericardial effusion. Mitral Valve: The mitral valve is normal in structure. No evidence of mitral valve regurgitation. No evidence of mitral valve stenosis. Tricuspid Valve: The tricuspid valve is normal in structure. Tricuspid valve regurgitation is not demonstrated. No evidence of tricuspid stenosis. Aortic Valve: The aortic valve was not well visualized. Aortic valve regurgitation is not visualized. No aortic stenosis is present. Pulmonic Valve: The pulmonic valve was normal in structure. Pulmonic valve regurgitation is not visualized. No evidence of pulmonic stenosis. Aorta: The aortic root is normal in size and structure. Venous: The inferior vena cava is normal in size with greater than 50% respiratory variability, suggesting right atrial pressure of 3 mmHg. IAS/Shunts: No atrial level shunt detected by color flow Doppler.  LEFT VENTRICLE PLAX  2D LVOT diam:     2.10 cm      Diastology LV SV:         98           LV e' medial:    7.33 cm/s LV SV Index:   48           LV E/e' medial:  11.4 LVOT Area:     3.46 cm     LV e' lateral:   8.73 cm/s                             LV E/e' lateral: 9.6  LV Volumes (MOD) LV vol d, MOD A4C: 122.0 ml LV vol s, MOD A4C: 40.4 ml LV SV MOD A4C:     122.0 ml RIGHT VENTRICLE RV S prime:     17.70 cm/s TAPSE (M-mode): 1.9 cm LEFT ATRIUM           Index       RIGHT ATRIUM          Index LA Vol (A4C): 22.3 ml 10.97 ml/m  RA Area:     7.78 cm                                   RA Volume:   14.30 ml 7.04 ml/m  AORTIC VALVE LVOT Vmax:   149.00 cm/s LVOT Vmean:  110.000 cm/s LVOT VTI:    0.282 m MITRAL VALVE MV Area (PHT): 4.21 cm    SHUNTS MV Decel Time: 180 msec    Systemic VTI:  0.28 m MV E velocity: 83.90 cm/s  Systemic Diam: 2.10 cm MV A velocity: 93.60 cm/s MV E/A ratio:  0.90 Candee Furbish MD Electronically signed by Candee Furbish MD Signature Date/Time: 08/19/2020/11:21:08 AM    Final      Labs:   Basic Metabolic Panel: Recent Labs  Lab 08/19/20 0447 08/19/20 1546 08/20/20 0036 08/21/20 0108 08/22/20 0041  NA 134* 136 137 134* 131*  K 3.7 3.6 3.6 4.0 4.6  CL 106 103 98 95* 94*  CO2 14* 24 29 30 27   GLUCOSE 86 144* 101* 91 92  BUN 64* 48* 33* 12 8  CREATININE 2.77* 1.59* 1.05 0.80 0.79  CALCIUM 7.3* 7.7* 7.5* 7.8* 7.7*  MG 0.4* 1.2* 1.9  --  1.0*  PHOS 4.2  --   --   --   --    GFR Estimated Creatinine Clearance: 103.7 mL/min (by C-G formula based on SCr of 0.79 mg/dL). Liver Function Tests: Recent Labs  Lab 08/18/20 1731 08/19/20 1546  AST 18 16  ALT 15 11  ALKPHOS 40 30*  BILITOT 0.9 0.9  PROT 7.8 5.8*  ALBUMIN 4.0 2.8*   Recent Labs  Lab 08/18/20 2132  LIPASE 26   Coagulation profile Recent Labs  Lab 08/18/20 1731  INR 1.2    CBC: Recent Labs  Lab 08/19/20 0447 08/20/20 0036 08/20/20 0943 08/20/20 1454 08/21/20 0108 08/21/20 0821 08/22/20 0041  WBC 5.5 3.7*  --   --   3.4* 3.1* 3.6*  NEUTROABS  --   --   --   --   --  1.2*  --   HGB 9.4* 8.3* 9.0* 9.3* 8.4* 9.0* 8.8*  HCT 27.7* 23.9* 24.7* 26.1* 23.7* 27.0* 26.5*  MCV 101.8* 96.4  --   --  98.3 100.7* 100.4*  PLT 87* 74*  --   --  74* 73* 87*   Anemia work up Recent Labs    08/21/20 0734 08/21/20 1011 08/21/20 1608  VITAMINB12  --  631  --   FOLATE  --  8.3  --   FERRITIN 422*  --   --   TIBC 295  --   --   IRON 92  --   --   RETICCTPCT  --   --  1.0   Microbiology Recent Results (from the past 240 hour(s))  SARS Coronavirus 2 by RT PCR (hospital order, performed in [facility] hospital lab) Nasopharyngeal Nasopharyngeal Swab     Status: None   Collection Time: 08/18/20  6:10 PM   Specimen: Nasopharyngeal Swab  Result Value Ref Range Status   SARS Coronavirus 2 NEGATIVE NEGATIVE Final    Comment: (NOTE) SARS-CoV-2 target nucleic acids are NOT DETECTED.  The SARS-CoV-2 RNA is generally detectable in upper and lower respiratory specimens during the acute phase of infection. The lowest concentration of SARS-CoV-2 viral copies this assay can detect is 250 copies / mL. A negative result does not preclude SARS-CoV-2 infection  and should not be used as the sole basis for treatment or other patient management decisions.  A negative result may occur with improper specimen collection / handling, submission of specimen other than nasopharyngeal swab, presence of viral mutation(s) within the areas targeted by this assay, and inadequate number of viral copies (<250 copies / mL). A negative result must be combined with clinical observations, patient history, and epidemiological information.  Fact Sheet for Patients:   StrictlyIdeas.no  Fact Sheet for Healthcare Providers: BankingDealers.co.za  This test is not yet approved or  cleared by the [place] FDA and has been authorized for detection and/or diagnosis of SARS-CoV-2 by FDA under an  Emergency Use Authorization (EUA).  This EUA will remain in effect (meaning this test can be used) for the duration of the COVID-19 declaration under Section 564(b)(1) of the Act, 21 U.S.C. section 360bbb-3(b)(1), unless the authorization is terminated or revoked sooner.  Performed at [facility], [place] [address] [place], [place] [address]   Blood Culture (routine x 2)     Status: None (Preliminary result)   Collection Time: 08/18/20  6:10 PM   Specimen: BLOOD  Result Value Ref Range Status   Specimen Description BLOOD RIGHT ANTECUBITAL  Final   Special Requests   Final    BOTTLES DRAWN AEROBIC AND ANAEROBIC Blood Culture results may not be optimal due to an excessive volume of blood received in culture bottles   Culture   Final    NO GROWTH 4 DAYS Performed at [facility], [place] [address], [place], [place] [address]    Report Status PENDING  Incomplete  Urine culture     Status: Abnormal   Collection Time: 08/18/20  9:49 PM   Specimen: In/Out Cath Urine  Result Value Ref Range Status   Specimen Description IN/OUT CATH URINE  Final   Special Requests   Final    NONE Performed at [facility], [place] [address], [place], [place] [address]    Culture MULTIPLE ORGANISMS PRESENT, NONE PREDOMINANT (A)  Final   Report Status 08/20/2020 FINAL  Final  MRSA PCR Screening     Status: None   Collection Time: 08/19/20  8:55 PM   Specimen: Nasopharyngeal  Result Value Ref Range Status   MRSA by PCR NEGATIVE NEGATIVE Final    Comment:        The GeneXpert MRSA Assay (FDA approved for NASAL specimens only), is one component of a comprehensive MRSA colonization surveillance program. It is not intended to diagnose MRSA infection nor to guide or monitor treatment for MRSA infections. Performed at [facility], [place] [address], [place], [place] [address]      Discharge Instructions:   Discharge Instructions    Call MD for:  extreme fatigue   Complete by: As  directed    Call MD for:  persistant dizziness or light-headedness   Complete by: As directed    Call MD for:  persistant nausea and vomiting   Complete by: As directed    Diet - low sodium heart healthy   Complete by: As directed    Discharge instructions   Complete by: As directed    OK to resume pre-hospital medications, but if nausea and vomiting return, hold them until your symptoms resolve.   Increase activity slowly   Complete by: As directed      Allergies as of 08/22/2020      Reactions   Morphine And Related    "  i go beside myself, i dont know. Its not pretty."    Prozac [fluoxetine Hcl] Itching   Sulfa Antibiotics Other (See Comments)   Doesn't feel well when taking       Medication List    STOP taking these medications   baclofen 10 MG tablet Commonly known as: LIORESAL   Humira 40 MG/0.8ML Pskt Generic drug: Adalimumab   leflunomide 20 MG tablet Commonly known as: ARAVA     TAKE these medications   allopurinol 300 MG tablet Commonly known as: ZYLOPRIM Take 300 mg by mouth every evening.   divalproex 500 MG 24 hr tablet Commonly known as: Depakote ER Take 2 tablets (1,000 mg total) by mouth at bedtime.   Eszopiclone 3 MG Tabs Take 3 mg by mouth at bedtime.   famotidine 20 MG tablet Commonly known as: PEPCID Take 20 mg by mouth 2 (two) times daily.   hydrochlorothiazide 25 MG tablet Commonly known as: HYDRODIURIL Take 25 mg by mouth daily.   HYDROcodone-acetaminophen 10-325 MG tablet Commonly known as: NORCO Take 1 tablet by mouth every 6 (six) hours as needed for moderate pain or severe pain.   lisinopril 40 MG tablet Commonly known as: ZESTRIL Take 40 mg by mouth daily.   Magnesium Oxide 400 (240 Mg) MG Tabs Take 1 tablet (400 mg total) by mouth in the morning and at bedtime for 14 days.   metoprolol succinate 50 MG 24 hr tablet Commonly known as: TOPROL-XL Take 50 mg by mouth daily.   pantoprazole 40 MG tablet Commonly known as:  PROTONIX Take 1 tablet (40 mg total) by mouth 2 (two) times daily.   pravastatin 40 MG tablet Commonly known as: PRAVACHOL Take 40 mg by mouth every evening.   tamsulosin 0.4 MG Caps capsule Commonly known as: FLOMAX Take 0.4 mg by mouth.   Vascepa 1 g capsule Generic drug: icosapent Ethyl Take 2 g by mouth 2 (two) times daily.       Follow-up Information    Eddie Johns, MD. Schedule an appointment as soon as possible for a visit in 2 week(s).   Specialty: Internal Medicine Why: Hospital follow-up Contact information: [place] [address] [phone]                Time coordinating discharge: 40 minutes  Signed:  Jacquelynn Cree, MD   [facility] 08/22/2020, 2:33 PM

## 2020-08-22 NOTE — Plan of Care (Signed)

## 2020-08-23 LAB — CULTURE, BLOOD (ROUTINE X 2): Culture: NO GROWTH

## 2020-08-23 LAB — PATHOLOGIST SMEAR REVIEW

## 2020-08-24 ENCOUNTER — Encounter (HOSPITAL_COMMUNITY): Payer: Self-pay

## 2020-08-24 ENCOUNTER — Inpatient Hospital Stay (HOSPITAL_COMMUNITY)
Admission: EM | Admit: 2020-08-24 | Discharge: 2020-08-28 | DRG: 316 | Disposition: A | Discharge status: Home / Self Care | Payer: Medicare HMO | Attending: Family Medicine | Admitting: Family Medicine

## 2020-08-24 ENCOUNTER — Other Ambulatory Visit: Payer: Self-pay

## 2020-08-24 DIAGNOSIS — K449 Diaphragmatic hernia without obstruction or gangrene: Secondary | ICD-10-CM | POA: Diagnosis present

## 2020-08-24 DIAGNOSIS — K317 Polyp of stomach and duodenum: Secondary | ICD-10-CM | POA: Diagnosis present

## 2020-08-24 DIAGNOSIS — G4700 Insomnia, unspecified: Secondary | ICD-10-CM | POA: Diagnosis present

## 2020-08-24 DIAGNOSIS — Z8711 Personal history of peptic ulcer disease: Secondary | ICD-10-CM

## 2020-08-24 DIAGNOSIS — F329 Major depressive disorder, single episode, unspecified: Secondary | ICD-10-CM | POA: Diagnosis present

## 2020-08-24 DIAGNOSIS — K200 Eosinophilic esophagitis: Secondary | ICD-10-CM | POA: Diagnosis present

## 2020-08-24 DIAGNOSIS — E785 Hyperlipidemia, unspecified: Secondary | ICD-10-CM | POA: Diagnosis present

## 2020-08-24 DIAGNOSIS — R1013 Epigastric pain: Secondary | ICD-10-CM | POA: Diagnosis present

## 2020-08-24 DIAGNOSIS — Z20822 Contact with and (suspected) exposure to covid-19: Secondary | ICD-10-CM | POA: Diagnosis present

## 2020-08-24 DIAGNOSIS — Z808 Family history of malignant neoplasm of other organs or systems: Secondary | ICD-10-CM

## 2020-08-24 DIAGNOSIS — Z79899 Other long term (current) drug therapy: Secondary | ICD-10-CM

## 2020-08-24 DIAGNOSIS — K219 Gastro-esophageal reflux disease without esophagitis: Secondary | ICD-10-CM | POA: Diagnosis present

## 2020-08-24 DIAGNOSIS — I959 Hypotension, unspecified: Principal | ICD-10-CM | POA: Diagnosis present

## 2020-08-24 DIAGNOSIS — I952 Hypotension due to drugs: Secondary | ICD-10-CM | POA: Diagnosis not present

## 2020-08-24 DIAGNOSIS — K222 Esophageal obstruction: Secondary | ICD-10-CM | POA: Diagnosis present

## 2020-08-24 DIAGNOSIS — Z8249 Family history of ischemic heart disease and other diseases of the circulatory system: Secondary | ICD-10-CM

## 2020-08-24 DIAGNOSIS — F17210 Nicotine dependence, cigarettes, uncomplicated: Secondary | ICD-10-CM | POA: Diagnosis present

## 2020-08-24 DIAGNOSIS — E8809 Other disorders of plasma-protein metabolism, not elsewhere classified: Secondary | ICD-10-CM | POA: Diagnosis present

## 2020-08-24 DIAGNOSIS — M069 Rheumatoid arthritis, unspecified: Secondary | ICD-10-CM | POA: Diagnosis present

## 2020-08-24 DIAGNOSIS — I10 Essential (primary) hypertension: Secondary | ICD-10-CM | POA: Diagnosis present

## 2020-08-24 DIAGNOSIS — K2970 Gastritis, unspecified, without bleeding: Secondary | ICD-10-CM | POA: Diagnosis present

## 2020-08-24 DIAGNOSIS — Z9049 Acquired absence of other specified parts of digestive tract: Secondary | ICD-10-CM

## 2020-08-24 DIAGNOSIS — Z932 Ileostomy status: Secondary | ICD-10-CM

## 2020-08-24 LAB — CBC WITH DIFFERENTIAL/PLATELET
Abs Immature Granulocytes: 0.03 10*3/uL (ref 0.00–0.07)
Basophils Absolute: 0.1 10*3/uL (ref 0.0–0.1)
Basophils Relative: 1 %
Eosinophils Absolute: 1 10*3/uL — ABNORMAL HIGH (ref 0.0–0.5)
Eosinophils Relative: 15 %
HCT: 32.8 % — ABNORMAL LOW (ref 39.0–52.0)
Hemoglobin: 10.5 g/dL — ABNORMAL LOW (ref 13.0–17.0)
Immature Granulocytes: 1 %
Lymphocytes Relative: 20 %
Lymphs Abs: 1.3 10*3/uL (ref 0.7–4.0)
MCH: 33.2 pg (ref 26.0–34.0)
MCHC: 32 g/dL (ref 30.0–36.0)
MCV: 103.8 fL — ABNORMAL HIGH (ref 80.0–100.0)
Monocytes Absolute: 0.4 10*3/uL (ref 0.1–1.0)
Monocytes Relative: 6 %
Neutro Abs: 3.8 10*3/uL (ref 1.7–7.7)
Neutrophils Relative %: 57 %
Platelets: 128 10*3/uL — ABNORMAL LOW (ref 150–400)
RBC: 3.16 MIL/uL — ABNORMAL LOW (ref 4.22–5.81)
RDW: 13.4 % (ref 11.5–15.5)
WBC: 6.6 10*3/uL (ref 4.0–10.5)
nRBC: 0 % (ref 0.0–0.2)

## 2020-08-24 LAB — COMPREHENSIVE METABOLIC PANEL
ALT: 33 U/L (ref 0–44)
AST: 36 U/L (ref 15–41)
Albumin: 3.4 g/dL — ABNORMAL LOW (ref 3.5–5.0)
Alkaline Phosphatase: 54 U/L (ref 38–126)
Anion gap: 10 (ref 5–15)
BUN: 17 mg/dL (ref 8–23)
CO2: 23 mmol/L (ref 22–32)
Calcium: 9.2 mg/dL (ref 8.9–10.3)
Chloride: 99 mmol/L (ref 98–111)
Creatinine, Ser: 1.23 mg/dL (ref 0.61–1.24)
GFR, Estimated: >60 mL/min (ref >60)
Glucose, Bld: 118 mg/dL — ABNORMAL HIGH (ref 70–99)
Potassium: 4.9 mmol/L (ref 3.5–5.1)
Sodium: 132 mmol/L — ABNORMAL LOW (ref 135–145)
Total Bilirubin: 0.5 mg/dL (ref 0.3–1.2)
Total Protein: 7.4 g/dL (ref 6.5–8.1)

## 2020-08-24 LAB — MAGNESIUM: Magnesium: 1.5 mg/dL — ABNORMAL LOW (ref 1.7–2.4)

## 2020-08-24 LAB — CBG MONITORING, ED: Glucose-Capillary: 104 mg/dL — ABNORMAL HIGH (ref 70–99)

## 2020-08-24 LAB — VALPROIC ACID LEVEL: Valproic Acid Lvl: 38 ug/mL — ABNORMAL LOW (ref 50.0–100.0)

## 2020-08-24 LAB — LACTIC ACID, PLASMA: Lactic Acid, Venous: 1.5 mmol/L (ref 0.5–1.9)

## 2020-08-24 LAB — SARS CORONAVIRUS 2 BY RT PCR (HOSPITAL ORDER, PERFORMED IN ~~LOC~~ HOSPITAL LAB): SARS Coronavirus 2: NEGATIVE

## 2020-08-24 MED ORDER — DIVALPROEX SODIUM ER 500 MG PO TB24
1000.0000 mg | ORAL_TABLET | Freq: Every day | ORAL | Status: DC
  Administered 2020-08-24 – 2020-08-27 (×4): 1000 mg via ORAL
  Filled 2020-08-24 (×5): qty 2

## 2020-08-24 MED ORDER — HYDROCODONE-ACETAMINOPHEN 10-325 MG PO TABS
1.0000 | ORAL_TABLET | Freq: Four times a day (QID) | ORAL | Status: DC | PRN
Start: 2020-08-24 — End: 2020-08-28
  Administered 2020-08-25 – 2020-08-28 (×7): 1 via ORAL
  Filled 2020-08-24 (×7): qty 1

## 2020-08-24 MED ORDER — SODIUM CHLORIDE 0.9 % IV BOLUS
500.0000 mL | Freq: Once | INTRAVENOUS | Status: AC
  Administered 2020-08-24: 500 mL via INTRAVENOUS

## 2020-08-24 MED ORDER — PRAVASTATIN SODIUM 40 MG PO TABS
40.0000 mg | ORAL_TABLET | Freq: Every evening | ORAL | Status: DC
Start: 2020-08-24 — End: 2020-08-28
  Administered 2020-08-24 – 2020-08-27 (×4): 40 mg via ORAL
  Filled 2020-08-24 (×4): qty 1

## 2020-08-24 MED ORDER — FAMOTIDINE 20 MG PO TABS
20.0000 mg | ORAL_TABLET | Freq: Two times a day (BID) | ORAL | Status: DC
  Administered 2020-08-25 – 2020-08-28 (×6): 20 mg via ORAL
  Filled 2020-08-24 (×9): qty 1

## 2020-08-24 MED ORDER — PANTOPRAZOLE SODIUM 40 MG PO TBEC
40.0000 mg | DELAYED_RELEASE_TABLET | Freq: Two times a day (BID) | ORAL | Status: DC
  Administered 2020-08-24 – 2020-08-28 (×8): 40 mg via ORAL
  Filled 2020-08-24 (×8): qty 1

## 2020-08-24 MED ORDER — LACTATED RINGERS IV SOLN: INTRAVENOUS | Status: DC

## 2020-08-24 MED ORDER — ZOLPIDEM TARTRATE 5 MG PO TABS
5.0000 mg | ORAL_TABLET | Freq: Every day | ORAL | Status: DC
  Administered 2020-08-24 – 2020-08-27 (×4): 5 mg via ORAL
  Filled 2020-08-24 (×4): qty 1

## 2020-08-24 MED ORDER — SODIUM CHLORIDE 0.9 % IV BOLUS
1000.0000 mL | Freq: Once | INTRAVENOUS | Status: AC
  Administered 2020-08-24: 1000 mL via INTRAVENOUS

## 2020-08-24 MED ORDER — TAMSULOSIN HCL 0.4 MG PO CAPS
0.4000 mg | ORAL_CAPSULE | Freq: Every day | ORAL | Status: DC
  Administered 2020-08-25 – 2020-08-28 (×4): 0.4 mg via ORAL
  Filled 2020-08-24 (×4): qty 1

## 2020-08-24 MED ORDER — ENOXAPARIN SODIUM 40 MG/0.4ML ~~LOC~~ SOLN: 40.0000 mg | SUBCUTANEOUS | Status: DC

## 2020-08-24 NOTE — H&P (Signed)
History and Physical    E[facility] [id number] DOB: [birth year] DOA: 08/24/2020  PCP: Eddie Johns, MD   Chief Complaint: Hypotension  HPI: [facility] is a 65 y.o. male with medical history significant of hypertension, GERD, peptic ulcer disease with H. pylori infection, chronic severe recurrent rheumatoid arthritis, major depressive disorder, insomnia, tobacco and alcohol abuse.  Patient was just discharged from our facility on 08/22/2020 in the setting of severe sepsis, hypotension, AKI in setting of enteritis with intractable nausea vomiting.  At that time patient's blood pressure was well controlled if not elevated and was discharged to continue home blood pressure medications but to hold his leflunomide and Humira in the setting of pancytopenia and infection.  Patient was home and doing quite well for 24 hours however last 24 hours patient states he was markedly fatigued, notable blood pressure at home was 70/40 and his wife convinced him to return back to the hospital.  Patient and wife report resuming all antihypertensive medications at discharge.  No other medication changes or issues over the past 48 hours.  In the ED patient's blood pressure was as low as 73/57, after 1 L IV fluids blood pressure improved minimally 92/61, patient remained quite symptomatic.  Given patient's profound hypotension and symptoms hospitalist was called for admission.  Review of Systems: As per HPI fatigue, weakness, hypotension and lightheadedness with exertion or positional changes.  Denies nausea vomiting diarrhea constipation headache fevers or chills.   Assessment/Plan Active Problems:   Hypotension    Acute symptomatic hypotension, rule out polypharmacy Unlikely infection -Continue to hold home blood pressure medications as these are likely the primary source of hypotension -Creatinine within normal limits, unlikely profoundly dehydrated minimal improvement with IV fluids -Follow closely, follow  cultures, currently no acute signs for infection despite recent infection for sepsis secondary to enteritis which appears to have resolved at this point  Severe rheumatoid arthritis -Continuing to hold leflunomide and Humira in the setting of recent infection  GERD/peptic ulcer disease -No issues or complaints, continue home medications  Major depressive disorder -Continue home medications  Insomnia -Resume home medications  Tobacco/alcohol use -Lengthy discussion at bedside with patient and wife about need for tobacco cessation and moderation with alcohol use.  Patient understands the risks of continuing these lifestyle choices.  DVT prophylaxis: Lovenox Code Status: Full Family Communication: At bedside Status is: Observation  Dispo: The patient is from: Home              Anticipated d/c is to: Home              Anticipated d/c date is: 24 to 48 hours              Patient currently not medically stable for discharge profound hypotension not improving with IV fluids  Consultants:   None  Procedures:   None planned   Past Medical History:  Diagnosis Date  . Anxiety   . Arthritis   . Depression   . Diverticulitis   . Gastric ulcer   . Hyperlipidemia   . Hypertension   . Syncope and collapse 02/14/2019    Past Surgical History:  Procedure Laterality Date  . COLON SURGERY    . COLOSTOMY    . ESOPHAGOGASTRODUODENOSCOPY N/A 03/13/2013   Procedure: ESOPHAGOGASTRODUODENOSCOPY (EGD);  Surgeon: Lear Ng, MD;  Location: [facility] ENDOSCOPY;  Service: Endoscopy;  Laterality: N/A;  . rheumatoid arthritis    . SURGERY SCROTAL / TESTICULAR  2014  reports that he has been smoking cigarettes. He has been smoking about 0.25 packs per day. He has never used smokeless tobacco. He reports current alcohol use of about 12.0 standard drinks of alcohol per week. He reports previous drug use. Drug: Cocaine.  Allergies  Allergen Reactions  . Morphine And Related     "i go  beside myself, i dont know. Its not pretty."   . Prozac [Fluoxetine Hcl] Itching  . Sulfa Antibiotics Other (See Comments)    Doesn't feel well when taking     Family History  Problem Relation Age of Onset  . Brain cancer Mother   . Hypertension Mother   . Alcohol abuse Mother   . Depression Mother   . Depression Sister   . Heart disease Sister   . Alcohol abuse Sister   . Hypertension Sister   . Drug abuse Brother   . Cirrhosis Maternal Grandfather   . Hypertension Maternal Grandfather   . Hyperlipidemia Maternal Grandfather   . Heart disease Maternal Grandmother   . Diverticulitis Half-Brother   . Depression Half-Brother   . Depression Half-Sister     Prior to Admission medications   Medication Sig Start Date End Date Taking? Authorizing Provider  allopurinol (ZYLOPRIM) 300 MG tablet Take 300 mg by mouth every evening. 01/17/19   [provider]  divalproex (DEPAKOTE ER) 500 MG 24 hr tablet Take 2 tablets (1,000 mg total) by mouth at bedtime. 08/16/20   Donnal Moat T, PA-C  Eszopiclone 3 MG TABS Take 3 mg by mouth at bedtime. 07/30/20   [provider]  famotidine (PEPCID) 20 MG tablet Take 20 mg by mouth 2 (two) times daily. 04/30/20   [provider]  hydrochlorothiazide (HYDRODIURIL) 25 MG tablet Take 25 mg by mouth daily.    [provider]  HYDROcodone-acetaminophen (NORCO) 10-325 MG tablet Take 1 tablet by mouth every 6 (six) hours as needed for moderate pain or severe pain. 08/17/20   [provider]  lisinopril (ZESTRIL) 40 MG tablet Take 40 mg by mouth daily. 07/26/20   [provider]  Magnesium Oxide 400 (240 Mg) MG TABS Take 1 tablet (400 mg total) by mouth in the morning and at bedtime for 14 days. 08/22/20 09/05/20  Rama, Venetia Maxon, MD  metoprolol succinate (TOPROL-XL) 50 MG 24 hr tablet Take 50 mg by mouth daily.  01/02/17   [provider]  pantoprazole (PROTONIX) 40 MG tablet Take 1 tablet (40 mg total)  by mouth 2 (two) times daily. 08/22/20   Rama, Venetia Maxon, MD  pravastatin (PRAVACHOL) 40 MG tablet Take 40 mg by mouth every evening. 12/05/16   [provider]  tamsulosin (FLOMAX) 0.4 MG CAPS capsule Take 0.4 mg by mouth.    [provider]  VASCEPA 1 g CAPS Take 2 g by mouth 2 (two) times daily. 12/25/16   [provider]    Physical Exam: Vitals:   08/24/20 1315 08/24/20 1330 08/24/20 1345 08/24/20 1400  BP: (!) 88/66 (!) 85/56 92/61 100/74  Pulse: (!) 54 (!) 51 (!) 54 (!) 56  Resp: 13 12 (!) 25 14  Temp:      SpO2: 100% 99% 100% 100%    Constitutional: NAD, calm, comfortable Vitals:   08/24/20 1315 08/24/20 1330 08/24/20 1345 08/24/20 1400  BP: (!) 88/66 (!) 85/56 92/61 100/74  Pulse: (!) 54 (!) 51 (!) 54 (!) 56  Resp: 13 12 (!) 25 14  Temp:      SpO2:  100% 99% 100% 100%   General:  Pleasantly resting in bed, No acute distress. HEENT:  Normocephalic atraumatic.  Sclerae nonicteric, noninjected.  Extraocular movements intact bilaterally. Neck:  Without mass or deformity.  Trachea is midline. Lungs:  Clear to auscultate bilaterally without rhonchi, wheeze, or rales. Heart:  Regular rate and rhythm.  Without murmurs, rubs, or gallops. Abdomen:  Soft, nontender, nondistended.  Without guarding or rebound. Extremities: Without cyanosis, clubbing, edema, or obvious deformity. Vascular:  Dorsalis pedis and posterior tibial pulses palpable bilaterally. Skin:  Warm and dry, no erythema, no ulcerations.  Labs on Admission: I have personally reviewed following labs and imaging studies  CBC: Recent Labs  Lab 08/20/20 0036 08/20/20 0943 08/20/20 1454 08/21/20 0108 08/21/20 0821 08/22/20 0041 08/24/20 1201  WBC 3.7*  --   --  3.4* 3.1* 3.6* 6.6  NEUTROABS  --   --   --   --  1.2*  --  3.8  HGB 8.3*   < > 9.3* 8.4* 9.0* 8.8* 10.5*  HCT 23.9*   < > 26.1* 23.7* 27.0* 26.5* 32.8*  MCV 96.4  --   --  98.3 100.7* 100.4* 103.8*  PLT 74*  --   --  74* 73*  87* 128*   < > = values in this interval not displayed.   Basic Metabolic Panel: Recent Labs  Lab 08/19/20 0447 08/19/20 1546 08/20/20 0036 08/21/20 0108 08/22/20 0041 08/24/20 1201  NA 134* 136 137 134* 131* 132*  K 3.7 3.6 3.6 4.0 4.6 4.9  CL 106 103 98 95* 94* 99  CO2 14* 24 29 30 27 23   GLUCOSE 86 144* 101* 91 92 118*  BUN 64* 48* 33* 12 8 17   CREATININE 2.77* 1.59* 1.05 0.80 0.79 1.23  CALCIUM 7.3* 7.7* 7.5* 7.8* 7.7* 9.2  MG 0.4* 1.2* 1.9  --  1.0* 1.5*  PHOS 4.2  --   --   --   --   --    GFR: Estimated Creatinine Clearance: 67.5 mL/min (by C-G formula based on SCr of 1.23 mg/dL). Liver Function Tests: Recent Labs  Lab 08/18/20 1731 08/19/20 1546 08/24/20 1201  AST 18 16 36  ALT 15 11 33  ALKPHOS 40 30* 54  BILITOT 0.9 0.9 0.5  PROT 7.8 5.8* 7.4  ALBUMIN 4.0 2.8* 3.4*   Recent Labs  Lab 08/18/20 2132  LIPASE 26   No results for input(s): AMMONIA in the last 168 hours. Coagulation Profile: Recent Labs  Lab 08/18/20 1731  INR 1.2   Cardiac Enzymes: No results for input(s): CKTOTAL, CKMB, CKMBINDEX, TROPONINI in the last 168 hours. BNP (last 3 results) No results for input(s): PROBNP in the last 8760 hours. HbA1C: No results for input(s): HGBA1C in the last 72 hours. CBG: Recent Labs  Lab 08/21/20 1245 08/24/20 1235  GLUCAP 98 104*   Lipid Profile: No results for input(s): CHOL, HDL, LDLCALC, TRIG, CHOLHDL, LDLDIRECT in the last 72 hours. Thyroid Function Tests: No results for input(s): TSH, T4TOTAL, FREET4, T3FREE, THYROIDAB in the last 72 hours. Anemia Panel: Recent Labs    08/21/20 1608  RETICCTPCT 1.0   Urine analysis:    Component Value Date/Time   COLORURINE YELLOW 08/18/2020 2014   APPEARANCEUR CLEAR 08/18/2020 2014   LABSPEC 1.011 08/18/2020 2014   PHURINE 5.0 08/18/2020 2014   GLUCOSEU NEGATIVE 08/18/2020 2014   HGBUR MODERATE (A) 08/18/2020 2014   BILIRUBINUR NEGATIVE 08/18/2020 2014   [place] NEGATIVE 08/18/2020 2014    PROTEINUR 30 (A)  08/18/2020 2014   UROBILINOGEN 0.2 03/13/2013 0157   NITRITE NEGATIVE 08/18/2020 2014   LEUKOCYTESUR NEGATIVE 08/18/2020 2014    Radiological Exams on Admission: No results found.  Little Ishikawa DO [facility] For contact please use secure messenger on Epic  If 7PM-7AM, please contact night-coverage located on [email or web address]   08/24/2020, 2:16 PM

## 2020-08-24 NOTE — ED Provider Notes (Signed)
[facility] Provider Note   CSN: [id number] Arrival date & time: 08/24/20  1120     History Chief Complaint  Patient presents with  . Hypotension  . Dizziness    Eddie Taylor is a 65 y.o. male with a past medical history of anxiety, arthritis, hypertension presenting to the ED with chief complaint of generalized weakness, dizziness and hypotension.  Patient was discharged from the hospital on 08/22/2020.  He was admitted for AKI and sepsis in the setting of ?enteritis.  He was treated with IV fluids and midodrine in the ER.  His blood pressures improved as well as his creatinine which had increased to 5.8 with baseline less than 1.  He was told to stop all of his arthritis medications including Humira as he was told that these were causing his blood pressure to be low.  He was told to continue his lisinopril and metoprolol which he has been taking.  He did take it this morning.  He has been checking his blood pressures at home and have been 0000000 to 80 systolic.  States that yesterday he started having worsening generalized weakness and dizziness and this worsened even more today which prompted his visit to the ER.  He denies any injuries or falls.  States that his GI symptoms have improved.  States that he has been staying hydrated adequately.  No chest pain, abdominal pain, shortness of breath, fever or sick contacts with similar symptoms.  HPI     Past Medical History:  Diagnosis Date  . Anxiety   . Arthritis   . Depression   . Diverticulitis   . Gastric ulcer   . Hyperlipidemia   . Hypertension   . Syncope and collapse 02/14/2019    Patient Active Problem List   Diagnosis Date Noted  . Severe sepsis (HCC) with hypotension, AKI secondary to enteritis (intractible nausea/vomiting, abdominal pain), metabolic acidosis [id number]  . [place] 08/22/2020  . Demand ischemia ([place]) 08/22/2020  . Aortic atherosclerosis ([place]) 08/22/2020  .  Hyperkalemia 08/22/2020  . Tremor 08/22/2020  . Hyponatremia 08/22/2020  . Acute urinary retention 08/22/2020  . Essential hypertension 08/22/2020  . Tobacco abuse 08/22/2020  . Ileostomy in place [facility]) 08/22/2020  . Parastomal hernia 08/22/2020  . Rheumatoid arthritis ([place]) on chronic immunosuppressives 08/22/2020  . Hypotension 08/19/2020  . Hypomagnesemia 08/19/2020  . Hypocalcemia 08/19/2020  . Intractable nausea and vomiting 08/19/2020  . Insomnia 08/19/2020  . High anion gap metabolic acidosis [id number]  . AKI (acute kidney injury) ([place]) 08/18/2020  . Dehydration   . AMS (altered mental status) 02/15/2019  . Right shoulder pain 02/15/2019  . Pancytopenia ([place]) 02/15/2019  . Alcohol dependence ([place]) 02/15/2019  . H pylori ulcer 03/14/2013  . Abdominal pain, unspecified site 03/13/2013  . Peptic ulcer disease 03/13/2013  . Cholelithiases 03/13/2013    Past Surgical History:  Procedure Laterality Date  . COLON SURGERY    . COLOSTOMY    . ESOPHAGOGASTRODUODENOSCOPY N/A 03/13/2013   Procedure: ESOPHAGOGASTRODUODENOSCOPY (EGD);  Surgeon: Lear Ng, MD;  Location: [facility] ENDOSCOPY;  Service: Endoscopy;  Laterality: N/A;  . rheumatoid arthritis    . SURGERY SCROTAL / TESTICULAR  2014       Family History  Problem Relation Age of Onset  . Brain cancer Mother   . Hypertension Mother   . Alcohol abuse Mother   . Depression Mother   . Depression Sister   . Heart disease Sister   . Alcohol abuse Sister   .  Hypertension Sister   . Drug abuse Brother   . Cirrhosis Maternal Grandfather   . Hypertension Maternal Grandfather   . Hyperlipidemia Maternal Grandfather   . Heart disease Maternal Grandmother   . Diverticulitis Half-Brother   . Depression Half-Brother   . Depression Half-Sister     Social History   Tobacco Use  . Smoking status: Current Every Day Smoker    Packs/day: 0.25    Types: Cigarettes  . Smokeless tobacco: Never Used  Vaping Use  . Vaping  Use: Never used  Substance Use Topics  . Alcohol use: Yes    Alcohol/week: 12.0 standard drinks    Types: 12 Cans of beer per week  . Drug use: Not Currently    Types: Cocaine    Home Medications Prior to Admission medications   Medication Sig Start Date End Date Taking? Authorizing Provider  allopurinol (ZYLOPRIM) 300 MG tablet Take 300 mg by mouth every evening. 01/17/19   [provider]  divalproex (DEPAKOTE ER) 500 MG 24 hr tablet Take 2 tablets (1,000 mg total) by mouth at bedtime. 08/16/20   Donnal Moat T, PA-C  Eszopiclone 3 MG TABS Take 3 mg by mouth at bedtime. 07/30/20   [provider]  famotidine (PEPCID) 20 MG tablet Take 20 mg by mouth 2 (two) times daily. 04/30/20   [provider]  hydrochlorothiazide (HYDRODIURIL) 25 MG tablet Take 25 mg by mouth daily.    [provider]  HYDROcodone-acetaminophen (NORCO) 10-325 MG tablet Take 1 tablet by mouth every 6 (six) hours as needed for moderate pain or severe pain. 08/17/20   [provider]  lisinopril (ZESTRIL) 40 MG tablet Take 40 mg by mouth daily. 07/26/20   [provider]  Magnesium Oxide 400 (240 Mg) MG TABS Take 1 tablet (400 mg total) by mouth in the morning and at bedtime for 14 days. 08/22/20 09/05/20  Rama, Venetia Maxon, MD  metoprolol succinate (TOPROL-XL) 50 MG 24 hr tablet Take 50 mg by mouth daily.  01/02/17   [provider]  pantoprazole (PROTONIX) 40 MG tablet Take 1 tablet (40 mg total) by mouth 2 (two) times daily. 08/22/20   Rama, Venetia Maxon, MD  pravastatin (PRAVACHOL) 40 MG tablet Take 40 mg by mouth every evening. 12/05/16   [provider]  tamsulosin (FLOMAX) 0.4 MG CAPS capsule Take 0.4 mg by mouth.    [provider]  VASCEPA 1 g CAPS Take 2 g by mouth 2 (two) times daily. 12/25/16   [provider]    Allergies    Morphine and related, Prozac [fluoxetine hcl], and Sulfa antibiotics  Review of Systems   Review of  Systems  Constitutional: Positive for fatigue. Negative for appetite change, chills and fever.  HENT: Negative for ear pain, rhinorrhea, sneezing and sore throat.   Eyes: Negative for photophobia and visual disturbance.  Respiratory: Negative for cough, chest tightness, shortness of breath and wheezing.   Cardiovascular: Negative for chest pain and palpitations.  Gastrointestinal: Negative for abdominal pain, blood in stool, constipation, diarrhea, nausea and vomiting.  Genitourinary: Negative for dysuria, hematuria and urgency.  Musculoskeletal: Negative for myalgias.  Skin: Negative for rash.  Neurological: Positive for dizziness and light-headedness. Negative for weakness.    Physical Exam Updated Vital Signs BP 100/74   Pulse (!) 56   Temp 97.7 F (36.5 C)   Resp 14   SpO2 100%   Physical Exam Vitals and nursing note reviewed.  Constitutional:  General: He is not in acute distress.    Appearance: He is well-developed and well-nourished.  HENT:     Head: Normocephalic and atraumatic.     Nose: Nose normal.  Eyes:     General: No scleral icterus.       Left eye: No discharge.     Extraocular Movements: EOM normal.     Conjunctiva/sclera: Conjunctivae normal.  Cardiovascular:     Rate and Rhythm: Normal rate and regular rhythm.     Pulses: Intact distal pulses.     Heart sounds: Normal heart sounds. No murmur heard. No friction rub. No gallop.   Pulmonary:     Effort: Pulmonary effort is normal. No respiratory distress.     Breath sounds: Normal breath sounds.  Abdominal:     General: Bowel sounds are normal. There is no distension.     Palpations: Abdomen is soft.     Tenderness: There is no abdominal tenderness. There is no guarding.  Musculoskeletal:        General: No edema. Normal range of motion.     Cervical back: Normal range of motion and neck supple.  Skin:    General: Skin is warm and dry.     Findings: No rash.  Neurological:     Mental Status:  He is alert and oriented to person, place, and time.     Cranial Nerves: No cranial nerve deficit.     Sensory: No sensory deficit.     Motor: No weakness or abnormal muscle tone.     Coordination: Coordination normal.  Psychiatric:        Mood and Affect: Mood and affect normal.     ED Results / Procedures / Treatments   Labs (all labs ordered are listed, but only abnormal results are displayed) Labs Reviewed  COMPREHENSIVE METABOLIC PANEL - Abnormal; Notable for the following components:      Result Value   Sodium 132 (*)    Glucose, Bld 118 (*)    Albumin 3.4 (*)    All other components within normal limits  CBC WITH DIFFERENTIAL/PLATELET - Abnormal; Notable for the following components:   RBC 3.16 (*)    Hemoglobin 10.5 (*)    HCT 32.8 (*)    MCV 103.8 (*)    Platelets 128 (*)    Eosinophils Absolute 1.0 (*)    All other components within normal limits  MAGNESIUM - Abnormal; Notable for the following components:   Magnesium 1.5 (*)    All other components within normal limits  VALPROIC ACID LEVEL - Abnormal; Notable for the following components:   Valproic Acid Lvl 38 (*)    All other components within normal limits  CBG MONITORING, ED - Abnormal; Notable for the following components:   Glucose-Capillary 104 (*)    All other components within normal limits  CULTURE, BLOOD (ROUTINE X 2)  CULTURE, BLOOD (ROUTINE X 2)  URINE CULTURE  SARS CORONAVIRUS 2 BY RT PCR (HOSPITAL ORDER, [place] [facility])  LACTIC ACID, PLASMA  LACTIC ACID, PLASMA  URINALYSIS, ROUTINE W REFLEX MICROSCOPIC    EKG None  Radiology No results found.  Procedures Procedures   Medications Ordered in ED Medications  sodium chloride 0.9 % bolus 1,000 mL (has no administration in time range)  sodium chloride 0.9 % bolus 500 mL (500 mLs Intravenous New Bag/Given 08/24/20 1253)    ED Course  I have reviewed the triage vital signs and the nursing notes.  Pertinent  labs &  imaging results that were available during my care of the patient were reviewed by me and considered in my medical decision making (see chart for details).  Clinical Course as of 08/24/20 1411  Fri Aug 24, 2020  1336 Creatinine: 1.23 [HK]  1336 Potassium: 4.9 [HK]  1336 BP(!): 88/66 [HK]  1336 Lactic Acid, Venous: 1.5 [HK]    Clinical Course User Index [HK] Delia Heady, PA-C   MDM Rules/Calculators/A&P                          65 year old male with a past medical history of anxiety, RA not currently on medication, hypertension on lisinopril and metoprolol presenting to the ED for generalized weakness, dizziness and hypotension.  Patient was admitted and discharged on 08/22/2020 for hypotension and AKI in the setting of sepsis.  States that his GI symptoms have improved.  He was told to stop taking his RA medications including Humira and leflunomide.  However he was told to continue his antihypertensives.  He took it yesterday and today as prescribed but was hypotensive to the [id number] systolic yesterday as well as today.  States that he has similar symptoms of generalized weakness, fatigue and dizziness that he had when he presented last week.  He denies any headache, vision changes, chest pain, shortness of breath.  States that he has been staying adequately hydrated.  On exam abdomen is soft, nontender nondistended.  No neurological deficits.  He is alert and oriented to his baseline.  Blood pressures here have been down to [id number] systolics with a MAP of 63.  Reviewed prior lab work, his creatinine at discharge was around 1.7.  Today remains normal at 1.2.  Magnesium remains low at 1.5 but this is higher than his discharge.  Depakote level is low.  I have ordered to liters of fluid here.  I feel that his hypotension could be in the setting of his continued antihypertensive use.  However because he remains hypertensive here I feel that he will benefit from admission to observe for improvement as well as any  medication adjustments.  Patient is agreeable to this plan.    Portions of this note were generated with [employer]. Dictation errors may occur despite best attempts at proofreading.  Final Clinical Impression(s) / ED Diagnoses Final diagnoses:  Hypotension, unspecified hypotension type    Rx / DC Orders ED Discharge Orders    None       Delia Heady, PA-C 08/24/20 1411    Elnora Morrison, MD 08/24/20 1534

## 2020-08-24 NOTE — ED Triage Notes (Signed)
Pt reports feeling weak, dizzy and BP low, SBP 80s in triage, pt recently admitted for the same, a.o

## 2020-08-24 NOTE — ED Notes (Signed)
Pt given a urinal and notified or need for a sample.

## 2020-08-24 NOTE — ED Notes (Signed)
Doc notified of pts bp

## 2020-08-25 DIAGNOSIS — I952 Hypotension due to drugs: Secondary | ICD-10-CM | POA: Diagnosis not present

## 2020-08-25 LAB — COMPREHENSIVE METABOLIC PANEL
ALT: 25 U/L (ref 0–44)
AST: 27 U/L (ref 15–41)
Albumin: 2.8 g/dL — ABNORMAL LOW (ref 3.5–5.0)
Alkaline Phosphatase: 45 U/L (ref 38–126)
Anion gap: 7 (ref 5–15)
BUN: 14 mg/dL (ref 8–23)
CO2: 23 mmol/L (ref 22–32)
Calcium: 8.5 mg/dL — ABNORMAL LOW (ref 8.9–10.3)
Chloride: 101 mmol/L (ref 98–111)
Creatinine, Ser: 0.94 mg/dL (ref 0.61–1.24)
GFR, Estimated: >60 mL/min (ref >60)
Glucose, Bld: 106 mg/dL — ABNORMAL HIGH (ref 70–99)
Potassium: 4.5 mmol/L (ref 3.5–5.1)
Sodium: 131 mmol/L — ABNORMAL LOW (ref 135–145)
Total Bilirubin: 0.6 mg/dL (ref 0.3–1.2)
Total Protein: 6 g/dL — ABNORMAL LOW (ref 6.5–8.1)

## 2020-08-25 LAB — CBC
HCT: 27.4 % — ABNORMAL LOW (ref 39.0–52.0)
Hemoglobin: 9.6 g/dL — ABNORMAL LOW (ref 13.0–17.0)
MCH: 35 pg — ABNORMAL HIGH (ref 26.0–34.0)
MCHC: 35 g/dL (ref 30.0–36.0)
MCV: 100 fL (ref 80.0–100.0)
Platelets: 104 10*3/uL — ABNORMAL LOW (ref 150–400)
RBC: 2.74 MIL/uL — ABNORMAL LOW (ref 4.22–5.81)
RDW: 13.4 % (ref 11.5–15.5)
WBC: 4.3 10*3/uL (ref 4.0–10.5)
nRBC: 0 % (ref 0.0–0.2)

## 2020-08-25 LAB — CORTISOL: Cortisol, Plasma: 4.2 ug/dL

## 2020-08-25 NOTE — Progress Notes (Signed)
OT Cancellation Note  Patient Details Name: SHERMAR FRIEDLAND MRN: [id number] DOB: 06/21/1956   Cancelled Treatment:    Reason Eval/Treat Not Completed: OT screened, no needs identified, will sign off. Collaborated with nursing staff and pt. Pt has been ambulating in room independently and managing colostomy bag this AM. Pt's BP readings have normalized now, denies dizziness with movement. Pt would benefit from further reinforcement on BP monitoring and when to take medications at home. No formal OT eval needed at this time as pt appears to be back at baseline.   Layla Maw 08/25/2020, 7:48 AM

## 2020-08-25 NOTE — Progress Notes (Signed)
PROGRESS NOTE    Eddie Taylor  BOF:[id number] DOB: [birth year] DOA: 08/24/2020 PCP: [name], MD  Outpatient Specialists:     Brief Narrative:  As per H&P done by Holli Humbles, MD: "Eddie Taylor is a 65 y.o. male with medical history significant of hypertension, GERD, peptic ulcer disease with H. pylori infection, chronic severe recurrent rheumatoid arthritis, major depressive disorder, insomnia, tobacco and alcohol abuse.  Patient was just discharged from our facility on 08/22/2020 in the setting of severe sepsis, hypotension, AKI in setting of enteritis with intractable nausea vomiting.  At that time patient's blood pressure was well controlled if not elevated and was discharged to continue home blood pressure medications but to hold his leflunomide and Humira in the setting of pancytopenia and infection.  Patient was home and doing quite well for 24 hours however last 24 hours patient states he was markedly fatigued, notable blood pressure at home was 70/40 and his wife convinced him to return back to the hospital.  Patient and wife report resuming all antihypertensive medications at discharge.  No other medication changes or issues over the past 48 hours.  In the ED patient's blood pressure was as low as 73/57, after 1 L IV fluids blood pressure improved minimally 92/61, patient remained quite symptomatic.  Given patient's profound hypotension and symptoms hospitalist was called for admission".  08/25/2020: Patient seen alongside patient's wife.  Hypotension has resolved.  Work-up is in progress.  We will continue IV fluids for now.  Hopefully, patient be discharged back on tomorrow if no signs of infection and the blood pressure remained stable.  Assessment & Plan:   Active Problems:   Hypotension  Acute symptomatic hypotension, rule out polypharmacy Unlikely infection -Continue to hold home blood pressure medications as these are likely the primary source of hypotension -Creatinine  within normal limits, unlikely profoundly dehydrated minimal improvement with IV fluids -Follow closely, follow cultures, currently no acute signs for infection despite recent infection for sepsis secondary to enteritis which appears to have resolved at this point 08/25/2020: Hypotension has resolved.  Patient is currently on IV fluids.  No signs of infection.  Will check cortisol level.  Rheumatoid arthritis -Continuing to hold leflunomide and Humira in the setting of recent infection  GERD/peptic ulcer disease -No issues or complaints, continue home medications  Major depressive disorder -Continue home medications  Insomnia -Resume home medications  Tobacco/alcohol use -Counseled to quit tobacco and alcohol use.  DVT prophylaxis: Lovenox Code Status: Full code Family Communication: Wife  Disposition Plan: Home eventually   Consultants:   None  Procedures:   None  Antimicrobials:   None   Subjective: No new complaints. No chest pain. No shortness of breath  Objective: Vitals:   08/24/20 2010 08/24/20 2346 08/25/20 0527 08/25/20 1100  BP: 119/69 135/77 126/75 123/73  Pulse: (!) 59 64 70 71  Resp: 15 17 16 18   Temp: (!) 97.3 F (36.3 C) 98.1 F (36.7 C) 97.7 F (36.5 C) 98.9 F (37.2 C)  TempSrc: Axillary Oral Oral Oral  SpO2: 100% 96% 96% 98%  Weight:      Height:        Intake/Output Summary (Last 24 hours) at 08/25/2020 1519 Last data filed at 08/25/2020 1500 Gross per 24 hour  Intake 2914 ml  Output 1850 ml  Net 1064 ml   Filed Weights   08/24/20 1700  Weight: 80.6 kg    Examination:  General exam: Appears calm and comfortable.  Dry buccal mucosa.  Respiratory system: Clear to auscultation. Respiratory effort normal. Cardiovascular system: S1 & S2  Gastrointestinal system: Abdomen is nondistended, soft and nontender. No organomegaly or masses felt. Normal bowel sounds heard. Central nervous system: Alert and oriented.  Patient moves all  extremities.   Extremities: Symmetric 5 x 5 power.   Data Reviewed: I have personally reviewed following labs and imaging studies  CBC: Recent Labs  Lab 08/21/20 0108 08/21/20 0821 08/22/20 0041 08/24/20 1201 08/25/20 0037  WBC 3.4* 3.1* 3.6* 6.6 4.3  NEUTROABS  --  1.2*  --  3.8  --   HGB 8.4* 9.0* 8.8* 10.5* 9.6*  HCT 23.7* 27.0* 26.5* 32.8* 27.4*  MCV 98.3 100.7* 100.4* 103.8* 100.0  PLT 74* 73* 87* 128* 123456*   Basic Metabolic Panel: Recent Labs  Lab 08/19/20 0447 08/19/20 1546 08/20/20 0036 08/21/20 0108 08/22/20 0041 08/24/20 1201 08/25/20 0037  NA 134* 136 137 134* 131* 132* 131*  K 3.7 3.6 3.6 4.0 4.6 4.9 4.5  CL 106 103 98 95* 94* 99 101  CO2 14* 24 29 30 27 23 23   GLUCOSE 86 144* 101* 91 92 118* 106*  BUN 64* 48* 33* 12 8 17 14   CREATININE 2.77* 1.59* 1.05 0.80 0.79 1.23 0.94  CALCIUM 7.3* 7.7* 7.5* 7.8* 7.7* 9.2 8.5*  MG 0.4* 1.2* 1.9  --  1.0* 1.5*  --   PHOS 4.2  --   --   --   --   --   --    GFR: Estimated Creatinine Clearance: 88.3 mL/min (by C-G formula based on SCr of 0.94 mg/dL). Liver Function Tests: Recent Labs  Lab 08/18/20 1731 08/19/20 1546 08/24/20 1201 08/25/20 0037  AST 18 16 36 27  ALT 15 11 33 25  ALKPHOS 40 30* 54 45  BILITOT 0.9 0.9 0.5 0.6  PROT 7.8 5.8* 7.4 6.0*  ALBUMIN 4.0 2.8* 3.4* 2.8*   Recent Labs  Lab 08/18/20 2132  LIPASE 26   No results for input(s): AMMONIA in the last 168 hours. Coagulation Profile: Recent Labs  Lab 08/18/20 1731  INR 1.2   Cardiac Enzymes: No results for input(s): CKTOTAL, CKMB, CKMBINDEX, TROPONINI in the last 168 hours. BNP (last 3 results) No results for input(s): PROBNP in the last 8760 hours. HbA1C: No results for input(s): HGBA1C in the last 72 hours. CBG: Recent Labs  Lab 08/21/20 1245 08/24/20 1235  GLUCAP 98 104*   Lipid Profile: No results for input(s): CHOL, HDL, LDLCALC, TRIG, CHOLHDL, LDLDIRECT in the last 72 hours. Thyroid Function Tests: No results for  input(s): TSH, T4TOTAL, FREET4, T3FREE, THYROIDAB in the last 72 hours. Anemia Panel: No results for input(s): VITAMINB12, FOLATE, FERRITIN, TIBC, IRON, RETICCTPCT in the last 72 hours. Urine analysis:    Component Value Date/Time   COLORURINE YELLOW 08/18/2020 2014   APPEARANCEUR CLEAR 08/18/2020 2014   LABSPEC 1.011 08/18/2020 2014   PHURINE 5.0 08/18/2020 2014   GLUCOSEU NEGATIVE 08/18/2020 2014   HGBUR MODERATE (A) 08/18/2020 2014   BILIRUBINUR NEGATIVE 08/18/2020 2014   [place] 08/18/2020 2014   PROTEINUR 30 (A) 08/18/2020 2014   UROBILINOGEN 0.2 03/13/2013 0157   NITRITE NEGATIVE 08/18/2020 2014   LEUKOCYTESUR NEGATIVE 08/18/2020 2014   Sepsis Labs: @LABRCNTIP (procalcitonin:4,lacticidven:4)  ) Recent Results (from the past 240 hour(s))  SARS Coronavirus 2 by RT PCR (hospital order, performed in [facility] hospital lab) Nasopharyngeal Nasopharyngeal Swab     Status: None   Collection Time: 08/18/20  6:10 PM   Specimen: Nasopharyngeal  Swab  Result Value Ref Range Status   SARS Coronavirus 2 NEGATIVE NEGATIVE Final    Comment: (NOTE) SARS-CoV-2 target nucleic acids are NOT DETECTED.  The SARS-CoV-2 RNA is generally detectable in upper and lower respiratory specimens during the acute phase of infection. The lowest concentration of SARS-CoV-2 viral copies this assay can detect is 250 copies / mL. A negative result does not preclude SARS-CoV-2 infection and should not be used as the sole basis for treatment or other patient management decisions.  A negative result may occur with improper specimen collection / handling, submission of specimen other than nasopharyngeal swab, presence of viral mutation(s) within the areas targeted by this assay, and inadequate number of viral copies (<250 copies / mL). A negative result must be combined with clinical observations, patient history, and epidemiological information.  Fact Sheet for Patients:    StrictlyIdeas.no  Fact Sheet for Healthcare Providers: BankingDealers.co.za  This test is not yet approved or  cleared by the [place] FDA and has been authorized for detection and/or diagnosis of SARS-CoV-2 by FDA under an Emergency Use Authorization (EUA).  This EUA will remain in effect (meaning this test can be used) for the duration of the COVID-19 declaration under Section 564(b)(1) of the Act, 21 U.S.C. section 360bbb-3(b)(1), unless the authorization is terminated or revoked sooner.  Performed at [facility], [place] [address] [place], [place] [address]   Blood Culture (routine x 2)     Status: None   Collection Time: 08/18/20  6:10 PM   Specimen: BLOOD  Result Value Ref Range Status   Specimen Description BLOOD RIGHT ANTECUBITAL  Final   Special Requests   Final    BOTTLES DRAWN AEROBIC AND ANAEROBIC Blood Culture results may not be optimal due to an excessive volume of blood received in culture bottles   Culture   Final    NO GROWTH 5 DAYS Performed at [facility], [place] [address] [place], [place] [address]    Report Status 08/23/2020 FINAL  Final  Urine culture     Status: Abnormal   Collection Time: 08/18/20  9:49 PM   Specimen: In/Out Cath Urine  Result Value Ref Range Status   Specimen Description IN/OUT CATH URINE  Final   Special Requests   Final    NONE Performed at [facility], [place] [address], [place], [place] [address]    Culture MULTIPLE ORGANISMS PRESENT, NONE PREDOMINANT (A)  Final   Report Status 08/20/2020 FINAL  Final  MRSA PCR Screening     Status: None   Collection Time: 08/19/20  8:55 PM   Specimen: Nasopharyngeal  Result Value Ref Range Status   MRSA by PCR NEGATIVE NEGATIVE Final    Comment:        The GeneXpert MRSA Assay (FDA approved for NASAL specimens only), is one component of a comprehensive MRSA colonization surveillance program. It is not intended to  diagnose MRSA infection nor to guide or monitor treatment for MRSA infections. Performed at [facility], [place] [address] [place], [place] [address]   Culture, blood (routine x 2)     Status: None (Preliminary result)   Collection Time: 08/24/20 11:57 AM   Specimen: BLOOD LEFT ARM  Result Value Ref Range Status   Specimen Description BLOOD LEFT ARM  Final   Special Requests   Final    BOTTLES DRAWN AEROBIC AND ANAEROBIC Blood Culture adequate volume   Culture   Final  NO GROWTH < 24 HOURS Performed at [place] [address], [place], [place] [address]    Report Status PENDING  Incomplete  Culture, blood (routine x 2)     Status: None (Preliminary result)   Collection Time: 08/24/20 12:50 PM   Specimen: BLOOD  Result Value Ref Range Status   Specimen Description BLOOD SITE NOT SPECIFIED  Final   Special Requests   Final    BOTTLES DRAWN AEROBIC AND ANAEROBIC Blood Culture adequate volume   Culture   Final    NO GROWTH < 24 HOURS Performed at [facility], [place] [address] [place], [place] [address]    Report Status PENDING  Incomplete  SARS Coronavirus 2 by RT PCR (hospital order, performed in [facility] hospital lab) Nasopharyngeal Nasopharyngeal Swab     Status: None   Collection Time: 08/24/20  1:14 PM   Specimen: Nasopharyngeal Swab  Result Value Ref Range Status   SARS Coronavirus 2 NEGATIVE NEGATIVE Final    Comment: (NOTE) SARS-CoV-2 target nucleic acids are NOT DETECTED.  The SARS-CoV-2 RNA is generally detectable in upper and lower respiratory specimens during the acute phase of infection. The lowest concentration of SARS-CoV-2 viral copies this assay can detect is 250 copies / mL. A negative result does not preclude SARS-CoV-2 infection and should not be used as the sole basis for treatment or other patient management decisions.  A negative result may occur with improper specimen collection / handling, submission of specimen other than  nasopharyngeal swab, presence of viral mutation(s) within the areas targeted by this assay, and inadequate number of viral copies (<250 copies / mL). A negative result must be combined with clinical observations, patient history, and epidemiological information.  Fact Sheet for Patients:   StrictlyIdeas.no  Fact Sheet for Healthcare Providers: BankingDealers.co.za  This test is not yet approved or  cleared by the [place] FDA and has been authorized for detection and/or diagnosis of SARS-CoV-2 by FDA under an Emergency Use Authorization (EUA).  This EUA will remain in effect (meaning this test can be used) for the duration of the COVID-19 declaration under Section 564(b)(1) of the Act, 21 U.S.C. section 360bbb-3(b)(1), unless the authorization is terminated or revoked sooner.  Performed at [facility], [place] [address] [place], [place] [address]          Radiology Studies: No results found.      Scheduled Meds: . divalproex  1,000 mg Oral QHS  . famotidine  20 mg Oral BID  . pantoprazole  40 mg Oral BID  . pravastatin  40 mg Oral QPM  . tamsulosin  0.4 mg Oral QPC breakfast  . zolpidem  5 mg Oral QHS   Continuous Infusions: . lactated ringers 75 mL/hr at 08/25/20 0530     LOS: 0 days    Time spent: 35 minutes.   Dana Allan, MD  [facility] Pager #: [phone] 7PM-7AM contact night coverage as above

## 2020-08-25 NOTE — Progress Notes (Signed)
PT Cancellation Note  Patient Details Name: Eddie Taylor MRN: [id number] DOB: 12-07-1955   Cancelled Treatment:    Reason Eval/Treat Not Completed: PT screened, no needs identified, will sign off Collaborated with nursing and OT. Patient has been ambulating in the room independently and per RN, steady on his feet performing dynamic tasks. Patient's BP readings have normalized and denies dizziness with activity. No formal PT evaluation required at this time. PT will sign off. Reconsult if needed.  Peggy Loge A. Gilford Rile PT, DPT Acute Rehabilitation Services Pager [phone] Office [phone]    Alda Lea 08/25/2020, 8:59 AM

## 2020-08-26 DIAGNOSIS — I952 Hypotension due to drugs: Secondary | ICD-10-CM | POA: Diagnosis not present

## 2020-08-26 LAB — CBC
HCT: 26 % — ABNORMAL LOW (ref 39.0–52.0)
Hemoglobin: 9.1 g/dL — ABNORMAL LOW (ref 13.0–17.0)
MCH: 34.7 pg — ABNORMAL HIGH (ref 26.0–34.0)
MCHC: 35 g/dL (ref 30.0–36.0)
MCV: 99.2 fL (ref 80.0–100.0)
Platelets: 102 10*3/uL — ABNORMAL LOW (ref 150–400)
RBC: 2.62 MIL/uL — ABNORMAL LOW (ref 4.22–5.81)
RDW: 13.4 % (ref 11.5–15.5)
WBC: 3.8 10*3/uL — ABNORMAL LOW (ref 4.0–10.5)
nRBC: 0 % (ref 0.0–0.2)

## 2020-08-26 MED ORDER — COSYNTROPIN 0.25 MG IJ SOLR
0.2500 mg | Freq: Once | INTRAMUSCULAR | Status: AC
  Administered 2020-08-27: 0.25 mg via INTRAVENOUS
  Filled 2020-08-26 (×2): qty 0.25

## 2020-08-26 NOTE — Progress Notes (Signed)
PROGRESS NOTE    Eddie Taylor  [id number] DOB: 08/28/1955 DOA: 08/24/2020 PCP: [name], MD  Outpatient Specialists:     Brief Narrative:  As per H&P done by Holli Humbles, MD: "Eddie Taylor is a 65 y.o. male with medical history significant of hypertension, GERD, peptic ulcer disease with H. pylori infection, chronic severe recurrent rheumatoid arthritis, major depressive disorder, insomnia, tobacco and alcohol abuse.  Patient was just discharged from our facility on 08/22/2020 in the setting of severe sepsis, hypotension, AKI in setting of enteritis with intractable nausea vomiting.  At that time patient's blood pressure was well controlled if not elevated and was discharged to continue home blood pressure medications but to hold his leflunomide and Humira in the setting of pancytopenia and infection.  Patient was home and doing quite well for 24 hours however last 24 hours patient states he was markedly fatigued, notable blood pressure at home was 70/40 and his wife convinced him to return back to the hospital.  Patient and wife report resuming all antihypertensive medications at discharge.  No other medication changes or issues over the past 48 hours.  In the ED patient's blood pressure was as low as 73/57, after 1 L IV fluids blood pressure improved minimally 92/61, patient remained quite symptomatic.  Given patient's profound hypotension and symptoms hospitalist was called for admission".  08/25/2020: Patient seen alongside patient's wife.  Hypotension has resolved.  Work-up is in progress.  We will continue IV fluids for now.  Hopefully, patient be discharged back on tomorrow if no signs of infection and the blood pressure remained stable.  08/26/2020: Blood pressure has remained stable with IV fluids.  No signs of infection.  Cortisol is noted to be low.  For cosyntropin stimulation test (this will be done tomorrow morning).  Assessment & Plan:   Active Problems:    Hypotension  Acute symptomatic hypotension: -Initially thought to be secondary to polypharmacy.  Antihypertensives have been on hold.  Patient has been on IV fluids.  Hypotension has resolved.  However, patient's cortisol was also noted to be low (4). -For cosyntropin stimulation test in the morning. -Disposition will depend on above tests. -Continue to assess any signs of infections, though, none for now.   -Patient may have been mildly volume depleted as well.  Rheumatoid arthritis -Continuing to hold leflunomide and Humira in the setting of recent infection  GERD/peptic ulcer disease -No issues or complaints, continue home medications  Major depressive disorder -Continue home medications  Insomnia -Resume home medications  Tobacco/alcohol use -Counseled to quit tobacco and alcohol use.  DVT prophylaxis: Lovenox Code Status: Full code Family Communication: Wife  Disposition Plan: Home eventually   Consultants:   None  Procedures:   None  Antimicrobials:   None   Subjective: No new complaints. No chest pain. No shortness of breath  Objective: Vitals:   08/25/20 2221 08/26/20 0428 08/26/20 1153 08/26/20 1750  BP: (!) 146/88 124/87 131/79 139/85  Pulse: 86 65 67 73  Resp: 18 18 18 18   Temp: 97.7 F (36.5 C) 97.9 F (36.6 C) 97.8 F (36.6 C) 97.9 F (36.6 C)  TempSrc: Oral Oral Oral Oral  SpO2: 99% 98% 98% 98%  Weight:      Height:        Intake/Output Summary (Last 24 hours) at 08/26/2020 1756 Last data filed at 08/26/2020 1700 Gross per 24 hour  Intake 2203.98 ml  Output 2880 ml  Net -676.02 ml   [employer]  08/24/20 1700  Weight: 80.6 kg    Examination:  General exam: Appears calm and comfortable.    Respiratory system: Clear to auscultation. Respiratory effort normal. Cardiovascular system: S1 & S2  Gastrointestinal system: Abdomen is nondistended, soft and nontender. No organomegaly or masses felt. Normal bowel sounds  heard. Central nervous system: Alert and oriented.  Patient moves all extremities.   Extremities: Symmetric 5 x 5 power.   Data Reviewed: I have personally reviewed following labs and imaging studies  CBC: Recent Labs  Lab 08/21/20 0821 08/22/20 0041 08/24/20 1201 08/25/20 0037 08/26/20 0103  WBC 3.1* 3.6* 6.6 4.3 3.8*  NEUTROABS 1.2*  --  3.8  --   --   HGB 9.0* 8.8* 10.5* 9.6* 9.1*  HCT 27.0* 26.5* 32.8* 27.4* 26.0*  MCV 100.7* 100.4* 103.8* 100.0 99.2  PLT 73* 87* 128* 104* 154*   Basic Metabolic Panel: Recent Labs  Lab 08/20/20 0036 08/21/20 0108 08/22/20 0041 08/24/20 1201 08/25/20 0037  NA 137 134* 131* 132* 131*  K 3.6 4.0 4.6 4.9 4.5  CL 98 95* 94* 99 101  CO2 29 30 27 23 23   GLUCOSE 101* 91 92 118* 106*  BUN 33* 12 8 17 14   CREATININE 1.05 0.80 0.79 1.23 0.94  CALCIUM 7.5* 7.8* 7.7* 9.2 8.5*  MG 1.9  --  1.0* 1.5*  --    GFR: Estimated Creatinine Clearance: 88.3 mL/min (by C-G formula based on SCr of 0.94 mg/dL). Liver Function Tests: Recent Labs  Lab 08/24/20 1201 08/25/20 0037  AST 36 27  ALT 33 25  ALKPHOS 54 45  BILITOT 0.5 0.6  PROT 7.4 6.0*  ALBUMIN 3.4* 2.8*   No results for input(s): LIPASE, AMYLASE in the last 168 hours. No results for input(s): AMMONIA in the last 168 hours. Coagulation Profile: No results for input(s): INR, PROTIME in the last 168 hours. Cardiac Enzymes: No results for input(s): CKTOTAL, CKMB, CKMBINDEX, TROPONINI in the last 168 hours. BNP (last 3 results) No results for input(s): PROBNP in the last 8760 hours. HbA1C: No results for input(s): HGBA1C in the last 72 hours. CBG: Recent Labs  Lab 08/21/20 1245 08/24/20 1235  GLUCAP 98 104*   Lipid Profile: No results for input(s): CHOL, HDL, LDLCALC, TRIG, CHOLHDL, LDLDIRECT in the last 72 hours. Thyroid Function Tests: No results for input(s): TSH, T4TOTAL, FREET4, T3FREE, THYROIDAB in the last 72 hours. Anemia Panel: No results for input(s): VITAMINB12,  FOLATE, FERRITIN, TIBC, IRON, RETICCTPCT in the last 72 hours. Urine analysis:    Component Value Date/Time   COLORURINE YELLOW 08/18/2020 2014   APPEARANCEUR CLEAR 08/18/2020 2014   LABSPEC 1.011 08/18/2020 2014   PHURINE 5.0 08/18/2020 2014   GLUCOSEU NEGATIVE 08/18/2020 2014   HGBUR MODERATE (A) 08/18/2020 2014   BILIRUBINUR NEGATIVE 08/18/2020 2014   [place] 08/18/2020 2014   PROTEINUR 30 (A) 08/18/2020 2014   UROBILINOGEN 0.2 03/13/2013 0157   NITRITE NEGATIVE 08/18/2020 2014   LEUKOCYTESUR NEGATIVE 08/18/2020 2014   Sepsis Labs: @LABRCNTIP (procalcitonin:4,lacticidven:4)  ) Recent Results (from the past 240 hour(s))  SARS Coronavirus 2 by RT PCR (hospital order, performed in [facility] hospital lab) Nasopharyngeal Nasopharyngeal Swab     Status: None   Collection Time: 08/18/20  6:10 PM   Specimen: Nasopharyngeal Swab  Result Value Ref Range Status   SARS Coronavirus 2 NEGATIVE NEGATIVE Final    Comment: (NOTE) SARS-CoV-2 target nucleic acids are NOT DETECTED.  The SARS-CoV-2 RNA is generally detectable in upper and lower respiratory  specimens during the acute phase of infection. The lowest concentration of SARS-CoV-2 viral copies this assay can detect is 250 copies / mL. A negative result does not preclude SARS-CoV-2 infection and should not be used as the sole basis for treatment or other patient management decisions.  A negative result may occur with improper specimen collection / handling, submission of specimen other than nasopharyngeal swab, presence of viral mutation(s) within the areas targeted by this assay, and inadequate number of viral copies (<250 copies / mL). A negative result must be combined with clinical observations, patient history, and epidemiological information.  Fact Sheet for Patients:   StrictlyIdeas.no  Fact Sheet for Healthcare Providers: BankingDealers.co.za  This test is not  yet approved or  cleared by the [place] FDA and has been authorized for detection and/or diagnosis of SARS-CoV-2 by FDA under an Emergency Use Authorization (EUA).  This EUA will remain in effect (meaning this test can be used) for the duration of the COVID-19 declaration under Section 564(b)(1) of the Act, 21 U.S.C. section 360bbb-3(b)(1), unless the authorization is terminated or revoked sooner.  Performed at [facility], [place] [address] [place], [place] [address]   Blood Culture (routine x 2)     Status: None   Collection Time: 08/18/20  6:10 PM   Specimen: BLOOD  Result Value Ref Range Status   Specimen Description BLOOD RIGHT ANTECUBITAL  Final   Special Requests   Final    BOTTLES DRAWN AEROBIC AND ANAEROBIC Blood Culture results may not be optimal due to an excessive volume of blood received in culture bottles   Culture   Final    NO GROWTH 5 DAYS Performed at [facility], [place] [address] [place], [place] [address]    Report Status 08/23/2020 FINAL  Final  Urine culture     Status: Abnormal   Collection Time: 08/18/20  9:49 PM   Specimen: In/Out Cath Urine  Result Value Ref Range Status   Specimen Description IN/OUT CATH URINE  Final   Special Requests   Final    NONE Performed at [facility], [place] [address], [place], [place] [address]    Culture MULTIPLE ORGANISMS PRESENT, NONE PREDOMINANT (A)  Final   Report Status 08/20/2020 FINAL  Final  MRSA PCR Screening     Status: None   Collection Time: 08/19/20  8:55 PM   Specimen: Nasopharyngeal  Result Value Ref Range Status   MRSA by PCR NEGATIVE NEGATIVE Final    Comment:        The GeneXpert MRSA Assay (FDA approved for NASAL specimens only), is one component of a comprehensive MRSA colonization surveillance program. It is not intended to diagnose MRSA infection nor to guide or monitor treatment for MRSA infections. Performed at [facility], [place] [address] [place], [place]  [address]   Culture, blood (routine x 2)     Status: None (Preliminary result)   Collection Time: 08/24/20 11:57 AM   Specimen: BLOOD LEFT ARM  Result Value Ref Range Status   Specimen Description BLOOD LEFT ARM  Final   Special Requests   Final    BOTTLES DRAWN AEROBIC AND ANAEROBIC Blood Culture adequate volume   Culture   Final    NO GROWTH 2 DAYS Performed at [facility], [place] [address], [place], [place] [address]    Report Status PENDING  Incomplete  Culture, blood (routine x 2)     Status: None (Preliminary result)  Collection Time: 08/24/20 12:50 PM   Specimen: BLOOD  Result Value Ref Range Status   Specimen Description BLOOD SITE NOT SPECIFIED  Final   Special Requests   Final    BOTTLES DRAWN AEROBIC AND ANAEROBIC Blood Culture adequate volume   Culture   Final    NO GROWTH 2 DAYS Performed at [facility], [address] [place], [place] [address]    Report Status PENDING  Incomplete  SARS Coronavirus 2 by RT PCR (hospital order, performed in [facility] hospital lab) Nasopharyngeal Nasopharyngeal Swab     Status: None   Collection Time: 08/24/20  1:14 PM   Specimen: Nasopharyngeal Swab  Result Value Ref Range Status   SARS Coronavirus 2 NEGATIVE NEGATIVE Final    Comment: (NOTE) SARS-CoV-2 target nucleic acids are NOT DETECTED.  The SARS-CoV-2 RNA is generally detectable in upper and lower respiratory specimens during the acute phase of infection. The lowest concentration of SARS-CoV-2 viral copies this assay can detect is 250 copies / mL. A negative result does not preclude SARS-CoV-2 infection and should not be used as the sole basis for treatment or other patient management decisions.  A negative result may occur with improper specimen collection / handling, submission of specimen other than nasopharyngeal swab, presence of viral mutation(s) within the areas targeted by this assay, and inadequate number of viral copies (<250 copies / mL). A negative  result must be combined with clinical observations, patient history, and epidemiological information.  Fact Sheet for Patients:   StrictlyIdeas.no  Fact Sheet for Healthcare Providers: BankingDealers.co.za  This test is not yet approved or  cleared by the [place] FDA and has been authorized for detection and/or diagnosis of SARS-CoV-2 by FDA under an Emergency Use Authorization (EUA).  This EUA will remain in effect (meaning this test can be used) for the duration of the COVID-19 declaration under Section 564(b)(1) of the Act, 21 U.S.C. section 360bbb-3(b)(1), unless the authorization is terminated or revoked sooner.  Performed at [facility], [place] [address], [place], [place] [address]          Radiology Studies: No results found.      Scheduled Meds: . [START ON 08/27/2020] cosyntropin  0.25 mg Intravenous Once  . divalproex  1,000 mg Oral QHS  . famotidine  20 mg Oral BID  . pantoprazole  40 mg Oral BID  . pravastatin  40 mg Oral QPM  . tamsulosin  0.4 mg Oral QPC breakfast  . zolpidem  5 mg Oral QHS   Continuous Infusions: . lactated ringers 75 mL/hr at 08/26/20 0835     LOS: 0 days    Time spent: 25 minutes.   Dana Allan, MD  [facility] Pager #: [phone] 7PM-7AM contact night coverage as above

## 2020-08-27 DIAGNOSIS — I10 Essential (primary) hypertension: Secondary | ICD-10-CM | POA: Diagnosis present

## 2020-08-27 DIAGNOSIS — F17210 Nicotine dependence, cigarettes, uncomplicated: Secondary | ICD-10-CM | POA: Diagnosis present

## 2020-08-27 DIAGNOSIS — K200 Eosinophilic esophagitis: Secondary | ICD-10-CM | POA: Diagnosis present

## 2020-08-27 DIAGNOSIS — R1013 Epigastric pain: Secondary | ICD-10-CM | POA: Diagnosis present

## 2020-08-27 DIAGNOSIS — Z9049 Acquired absence of other specified parts of digestive tract: Secondary | ICD-10-CM | POA: Diagnosis not present

## 2020-08-27 DIAGNOSIS — Z808 Family history of malignant neoplasm of other organs or systems: Secondary | ICD-10-CM | POA: Diagnosis not present

## 2020-08-27 DIAGNOSIS — K219 Gastro-esophageal reflux disease without esophagitis: Secondary | ICD-10-CM | POA: Diagnosis present

## 2020-08-27 DIAGNOSIS — K449 Diaphragmatic hernia without obstruction or gangrene: Secondary | ICD-10-CM | POA: Diagnosis present

## 2020-08-27 DIAGNOSIS — F329 Major depressive disorder, single episode, unspecified: Secondary | ICD-10-CM | POA: Diagnosis present

## 2020-08-27 DIAGNOSIS — I959 Hypotension, unspecified: Secondary | ICD-10-CM | POA: Diagnosis present

## 2020-08-27 DIAGNOSIS — K2970 Gastritis, unspecified, without bleeding: Secondary | ICD-10-CM | POA: Diagnosis present

## 2020-08-27 DIAGNOSIS — G4700 Insomnia, unspecified: Secondary | ICD-10-CM | POA: Diagnosis present

## 2020-08-27 DIAGNOSIS — Z79899 Other long term (current) drug therapy: Secondary | ICD-10-CM | POA: Diagnosis not present

## 2020-08-27 DIAGNOSIS — Z8249 Family history of ischemic heart disease and other diseases of the circulatory system: Secondary | ICD-10-CM | POA: Diagnosis not present

## 2020-08-27 DIAGNOSIS — Z932 Ileostomy status: Secondary | ICD-10-CM | POA: Diagnosis not present

## 2020-08-27 DIAGNOSIS — I952 Hypotension due to drugs: Secondary | ICD-10-CM | POA: Diagnosis not present

## 2020-08-27 DIAGNOSIS — E785 Hyperlipidemia, unspecified: Secondary | ICD-10-CM | POA: Diagnosis present

## 2020-08-27 DIAGNOSIS — M069 Rheumatoid arthritis, unspecified: Secondary | ICD-10-CM | POA: Diagnosis present

## 2020-08-27 DIAGNOSIS — E8809 Other disorders of plasma-protein metabolism, not elsewhere classified: Secondary | ICD-10-CM | POA: Diagnosis present

## 2020-08-27 DIAGNOSIS — Z8711 Personal history of peptic ulcer disease: Secondary | ICD-10-CM | POA: Diagnosis not present

## 2020-08-27 DIAGNOSIS — Z20822 Contact with and (suspected) exposure to covid-19: Secondary | ICD-10-CM | POA: Diagnosis present

## 2020-08-27 DIAGNOSIS — K222 Esophageal obstruction: Secondary | ICD-10-CM | POA: Diagnosis present

## 2020-08-27 DIAGNOSIS — K317 Polyp of stomach and duodenum: Secondary | ICD-10-CM | POA: Diagnosis present

## 2020-08-27 LAB — CBC
HCT: 27.8 % — ABNORMAL LOW (ref 39.0–52.0)
Hemoglobin: 9.3 g/dL — ABNORMAL LOW (ref 13.0–17.0)
MCH: 33.7 pg (ref 26.0–34.0)
MCHC: 33.5 g/dL (ref 30.0–36.0)
MCV: 100.7 fL — ABNORMAL HIGH (ref 80.0–100.0)
Platelets: 105 10*3/uL — ABNORMAL LOW (ref 150–400)
RBC: 2.76 MIL/uL — ABNORMAL LOW (ref 4.22–5.81)
RDW: 13.4 % (ref 11.5–15.5)
WBC: 2.8 10*3/uL — ABNORMAL LOW (ref 4.0–10.5)
nRBC: 0 % (ref 0.0–0.2)

## 2020-08-27 LAB — ACTH STIMULATION, 3 TIME POINTS
Cortisol, 30 Min: 20.3 ug/dL
Cortisol, 60 Min: 23.7 ug/dL
Cortisol, Base: 1.4 ug/dL

## 2020-08-27 MED ORDER — ENSURE ENLIVE PO LIQD
237.0000 mL | Freq: Two times a day (BID) | ORAL | Status: DC
  Administered 2020-08-28: 237 mL via ORAL

## 2020-08-27 NOTE — Progress Notes (Signed)
PROGRESS NOTE    Eddie Taylor  [id number] DOB: 12-12-1955 DOA: 08/24/2020 PCP: [name], MD   Brief Narrative:  This 65 years old male with PMH significant for hypertension, GERD, peptic ulcer disease with H. pylori infection, chronic severe recurrent rheumatoid arthritis, major depressive disorder, insomnia, tobacco and alcohol abuse.  Patient was just discharged from [facility] on 08/22/2020 in the setting of severe sepsis, hypotension, AKI in the setting of enteritis with intractable nausea and vomiting.  At the time patient blood pressure was well controlled and was discharged home to continue home blood pressure medication but hold his leflunomide and Humira in the setting of pancytopenia and infection.  Patient was doing quite well for 24 hours after discharge but later has developed marked fatigue and notable blood pressure at home was 70/ 40.  Patient was brought back in the ED,  found to have blood pressure of 73/57.  Patient remained quite symptomatic.  Hypotension has resolved.    Assessment & Plan:   Active Problems:   Hypotension   Acute symptomatic hypotension: >> Improving  -Initially thought to be secondary to polypharmacy.   - Antihypertensives have been discontinued.  Patient has been on IV fluids. -Hypotension has resolved.  However, patient's cortisol was also noted to be low (4). -cosyntropin stimulation test today in the morning. -Disposition will depend on above tests. -Continue to assess any signs of infections, though, none for now.   -Patient may have been mildly volume depleted as well. - Patient reports better, denies any dizziness, plapitations  Rheumatoid arthritis -Continuing to hold leflunomide and Humira in the setting of recent infection.  GERD/peptic ulcer disease -No issues or complaints, continue home medications.  Major depressive disorder -Continue home medications.  Insomnia -Resume home medications.  Tobacco/alcohol use -Counseled to  quit tobacco and alcohol use.   DVT prophylaxis: Lovenox Code Status: Full code Family Communication: No one at bedside Disposition Plan:  Status is: Inpatient  Remains inpatient appropriate because:Inpatient level of care appropriate due to severity of illness   Dispo: The patient is from: Home              Anticipated d/c is to: Home              Anticipated d/c date is: 1 day              Patient currently is not medically stable to d/c.   Difficult to place patient No   Consultants:   None  Procedures: None Antimicrobials:  Anti-infectives (From admission, onward)   None      Subjective: Patient was seen and examined at bedside.  Overnight events noted.   Patient denies any chest pain or shortness of breath.  Patient reports feeling better.  Objective: Vitals:   08/26/20 1750 08/26/20 2305 08/27/20 0507 08/27/20 1213  BP: 139/85 (!) 149/88 115/88 115/75  Pulse: 73 78 78 91  Resp: 18 17 16 16   Temp: 97.9 F (36.6 C) 98.2 F (36.8 C) 97.7 F (36.5 C) 98.2 F (36.8 C)  TempSrc: Oral Oral Oral Oral  SpO2: 98% 97% 96% 95%  Weight:      Height:        Intake/Output Summary (Last 24 hours) at 08/27/2020 1406 Last data filed at 08/27/2020 1200 Gross per 24 hour  Intake 2329 ml  Output 2450 ml  Net -121 ml   Filed Weights   08/24/20 1700  Weight: 80.6 kg    Examination:  General exam: Appears calm  and comfortable, not in any distress. Respiratory system: Clear to auscultation. Respiratory effort normal. Cardiovascular system: S1 & S2 heard, RRR. No JVD, murmurs, rubs, gallops or clicks. No pedal edema. Gastrointestinal system: Abdomen is nondistended, soft and nontender. No organomegaly or masses felt. Normal bowel sounds heard. Central nervous system: Alert and oriented. No focal neurological deficits. Extremities: Symmetric 5 x 5 power. Skin: No rashes, lesions or ulcers Psychiatry: Judgement and insight appear normal. Mood & affect appropriate.      Data Reviewed: I have personally reviewed following labs and imaging studies  CBC: Recent Labs  Lab 08/21/20 0821 08/22/20 0041 08/24/20 1201 08/25/20 0037 08/26/20 0103 08/27/20 0707  WBC 3.1* 3.6* 6.6 4.3 3.8* 2.8*  NEUTROABS 1.2*  --  3.8  --   --   --   HGB 9.0* 8.8* 10.5* 9.6* 9.1* 9.3*  HCT 27.0* 26.5* 32.8* 27.4* 26.0* 27.8*  MCV 100.7* 100.4* 103.8* 100.0 99.2 100.7*  PLT 73* 87* 128* 104* 102* 123456*   Basic Metabolic Panel: Recent Labs  Lab 08/21/20 0108 08/22/20 0041 08/24/20 1201 08/25/20 0037  NA 134* 131* 132* 131*  K 4.0 4.6 4.9 4.5  CL 95* 94* 99 101  CO2 30 27 23 23   GLUCOSE 91 92 118* 106*  BUN 12 8 17 14   CREATININE 0.80 0.79 1.23 0.94  CALCIUM 7.8* 7.7* 9.2 8.5*  MG  --  1.0* 1.5*  --    GFR: Estimated Creatinine Clearance: 88.3 mL/min (by C-G formula based on SCr of 0.94 mg/dL). Liver Function Tests: Recent Labs  Lab 08/24/20 1201 08/25/20 0037  AST 36 27  ALT 33 25  ALKPHOS 54 45  BILITOT 0.5 0.6  PROT 7.4 6.0*  ALBUMIN 3.4* 2.8*   No results for input(s): LIPASE, AMYLASE in the last 168 hours. No results for input(s): AMMONIA in the last 168 hours. Coagulation Profile: No results for input(s): INR, PROTIME in the last 168 hours. Cardiac Enzymes: No results for input(s): CKTOTAL, CKMB, CKMBINDEX, TROPONINI in the last 168 hours. BNP (last 3 results) No results for input(s): PROBNP in the last 8760 hours. HbA1C: No results for input(s): HGBA1C in the last 72 hours. CBG: Recent Labs  Lab 08/21/20 1245 08/24/20 1235  GLUCAP 98 104*   Lipid Profile: No results for input(s): CHOL, HDL, LDLCALC, TRIG, CHOLHDL, LDLDIRECT in the last 72 hours. Thyroid Function Tests: No results for input(s): TSH, T4TOTAL, FREET4, T3FREE, THYROIDAB in the last 72 hours. Anemia Panel: No results for input(s): VITAMINB12, FOLATE, FERRITIN, TIBC, IRON, RETICCTPCT in the last 72 hours. Sepsis Labs: Recent Labs  Lab 08/24/20 1254  LATICACIDVEN  1.5    Recent Results (from the past 240 hour(s))  SARS Coronavirus 2 by RT PCR (hospital order, performed in [facility] hospital lab) Nasopharyngeal Nasopharyngeal Swab     Status: None   Collection Time: 08/18/20  6:10 PM   Specimen: Nasopharyngeal Swab  Result Value Ref Range Status   SARS Coronavirus 2 NEGATIVE NEGATIVE Final    Comment: (NOTE) SARS-CoV-2 target nucleic acids are NOT DETECTED.  The SARS-CoV-2 RNA is generally detectable in upper and lower respiratory specimens during the acute phase of infection. The lowest concentration of SARS-CoV-2 viral copies this assay can detect is 250 copies / mL. A negative result does not preclude SARS-CoV-2 infection and should not be used as the sole basis for treatment or other patient management decisions.  A negative result may occur with improper specimen collection / handling, submission of specimen other than  nasopharyngeal swab, presence of viral mutation(s) within the areas targeted by this assay, and inadequate number of viral copies (<250 copies / mL). A negative result must be combined with clinical observations, patient history, and epidemiological information.  Fact Sheet for Patients:   StrictlyIdeas.no  Fact Sheet for Healthcare Providers: BankingDealers.co.za  This test is not yet approved or  cleared by the [place] FDA and has been authorized for detection and/or diagnosis of SARS-CoV-2 by FDA under an Emergency Use Authorization (EUA).  This EUA will remain in effect (meaning this test can be used) for the duration of the COVID-19 declaration under Section 564(b)(1) of the Act, 21 U.S.C. section 360bbb-3(b)(1), unless the authorization is terminated or revoked sooner.  Performed at [facility], [place] [address], [place], [place] [address]   Blood Culture (routine x 2)     Status: None   Collection Time: 08/18/20  6:10 PM   Specimen: BLOOD  Result  Value Ref Range Status   Specimen Description BLOOD RIGHT ANTECUBITAL  Final   Special Requests   Final    BOTTLES DRAWN AEROBIC AND ANAEROBIC Blood Culture results may not be optimal due to an excessive volume of blood received in culture bottles   Culture   Final    NO GROWTH 5 DAYS Performed at [facility], [place] [address] [place], [place] [address]    Report Status 08/23/2020 FINAL  Final  Urine culture     Status: Abnormal   Collection Time: 08/18/20  9:49 PM   Specimen: In/Out Cath Urine  Result Value Ref Range Status   Specimen Description IN/OUT CATH URINE  Final   Special Requests   Final    NONE Performed at [facility], [place] [address] [place], [place] [address]    Culture MULTIPLE ORGANISMS PRESENT, NONE PREDOMINANT (A)  Final   Report Status 08/20/2020 FINAL  Final  MRSA PCR Screening     Status: None   Collection Time: 08/19/20  8:55 PM   Specimen: Nasopharyngeal  Result Value Ref Range Status   MRSA by PCR NEGATIVE NEGATIVE Final    Comment:        The GeneXpert MRSA Assay (FDA approved for NASAL specimens only), is one component of a comprehensive MRSA colonization surveillance program. It is not intended to diagnose MRSA infection nor to guide or monitor treatment for MRSA infections. Performed at [facility], [place] [address], [place], [place] [address]   Culture, blood (routine x 2)     Status: None (Preliminary result)   Collection Time: 08/24/20 11:57 AM   Specimen: BLOOD LEFT ARM  Result Value Ref Range Status   Specimen Description BLOOD LEFT ARM  Final   Special Requests   Final    BOTTLES DRAWN AEROBIC AND ANAEROBIC Blood Culture adequate volume   Culture   Final    NO GROWTH 3 DAYS Performed at [facility], [address], [place], [place] [address]    Report Status PENDING  Incomplete  Culture, blood (routine x 2)     Status: None (Preliminary result)   Collection Time: 08/24/20 12:50 PM   Specimen: BLOOD  Result  Value Ref Range Status   Specimen Description BLOOD SITE NOT SPECIFIED  Final   Special Requests   Final    BOTTLES DRAWN AEROBIC AND ANAEROBIC Blood Culture adequate volume   Culture   Final    NO GROWTH 3 DAYS Performed at [facility]  Lab, [address] [place], [place] [address]    Report Status PENDING  Incomplete  SARS Coronavirus 2 by RT PCR (hospital order, performed in [facility] hospital lab) Nasopharyngeal Nasopharyngeal Swab     Status: None   Collection Time: 08/24/20  1:14 PM   Specimen: Nasopharyngeal Swab  Result Value Ref Range Status   SARS Coronavirus 2 NEGATIVE NEGATIVE Final    Comment: (NOTE) SARS-CoV-2 target nucleic acids are NOT DETECTED.  The SARS-CoV-2 RNA is generally detectable in upper and lower respiratory specimens during the acute phase of infection. The lowest concentration of SARS-CoV-2 viral copies this assay can detect is 250 copies / mL. A negative result does not preclude SARS-CoV-2 infection and should not be used as the sole basis for treatment or other patient management decisions.  A negative result may occur with improper specimen collection / handling, submission of specimen other than nasopharyngeal swab, presence of viral mutation(s) within the areas targeted by this assay, and inadequate number of viral copies (<250 copies / mL). A negative result must be combined with clinical observations, patient history, and epidemiological information.  Fact Sheet for Patients:   StrictlyIdeas.no  Fact Sheet for Healthcare Providers: BankingDealers.co.za  This test is not yet approved or  cleared by the [place] FDA and has been authorized for detection and/or diagnosis of SARS-CoV-2 by FDA under an Emergency Use Authorization (EUA).  This EUA will remain in effect (meaning this test can be used) for the duration of the COVID-19 declaration under Section 564(b)(1) of the Act, 21  U.S.C. section 360bbb-3(b)(1), unless the authorization is terminated or revoked sooner.  Performed at [facility], [place] [address], [place], [place] [address]    Radiology Studies: No results found.  Scheduled Meds: . divalproex  1,000 mg Oral QHS  . famotidine  20 mg Oral BID  . pantoprazole  40 mg Oral BID  . pravastatin  40 mg Oral QPM  . tamsulosin  0.4 mg Oral QPC breakfast  . zolpidem  5 mg Oral QHS   Continuous Infusions: . lactated ringers 75 mL/hr at 08/27/20 0221     LOS: 0 days    Time spent: 25 mins    Dot Splinter, MD [facility]   If 7PM-7AM, please contact night-coverage

## 2020-08-27 NOTE — Progress Notes (Signed)
Initial Nutrition Assessment  DOCUMENTATION CODES:   Not applicable  INTERVENTION:  Provide Ensure Enlive po BID, each supplement provides 350 kcal and 20 grams of protein.  Encourage adequate PO intake.  NUTRITION DIAGNOSIS:   Increased nutrient needs related to acute illness as evidenced by estimated needs.  GOAL:   Patient will meet greater than or equal to 90% of their needs  MONITOR:   PO intake,Supplement acceptance,Skin,Weight trends,Labs,I & O's  REASON FOR ASSESSMENT:   Malnutrition Screening Tool    ASSESSMENT:   65 years old male with PMH significant for hypertension, GERD, peptic ulcer disease with H. pylori infection, chronic severe recurrent rheumatoid arthritis, major depressive disorder, insomnia, tobacco and alcohol abuse. Presents with hypotension.   Pt unavailable during attempted time of contact. RD unable to obtain pt nutrition history at this time. Meal completion has been 75-100%. RD to order nutritional supplements to aid in caloric and protein needs. Unable to complete Nutrition-Focused physical exam at this time.   Labs and medications reviewed.   Diet Order:   Diet Order            Diet regular Room service appropriate? Yes; Fluid consistency: Thin  Diet effective now                 EDUCATION NEEDS:   Not appropriate for education at this time  Skin:  Skin Assessment: Reviewed RN Assessment  Last BM:  2/6  Height:   Ht Readings from Last 1 Encounters:  08/24/20 6' (1.829 m)    Weight:   Wt Readings from Last 1 Encounters:  08/24/20 80.6 kg    BMI:  Body mass index is 24.1 kg/m.  Estimated Nutritional Needs:   Kcal:  2100-2300  Protein:  105-115 grams  Fluid:  >/= 2.1 L/day  Corrin Parker, MS, RD, LDN RD pager number/after hours weekend pager number on Amion.

## 2020-08-28 ENCOUNTER — Inpatient Hospital Stay (HOSPITAL_COMMUNITY): Payer: Medicare HMO | Admitting: Anesthesiology

## 2020-08-28 ENCOUNTER — Encounter (HOSPITAL_COMMUNITY)
Admission: EM | Disposition: A | Discharge status: Home / Self Care | Payer: Self-pay | Source: Home / Self Care | Attending: Internal Medicine

## 2020-08-28 ENCOUNTER — Encounter (HOSPITAL_COMMUNITY): Payer: Self-pay | Admitting: Internal Medicine

## 2020-08-28 DIAGNOSIS — I952 Hypotension due to drugs: Secondary | ICD-10-CM | POA: Diagnosis not present

## 2020-08-28 HISTORY — PX: BIOPSY: SHX5522

## 2020-08-28 HISTORY — PX: ESOPHAGOGASTRODUODENOSCOPY: SHX5428

## 2020-08-28 LAB — CBC
HCT: 26.5 % — ABNORMAL LOW (ref 39.0–52.0)
Hemoglobin: 9 g/dL — ABNORMAL LOW (ref 13.0–17.0)
MCH: 34 pg (ref 26.0–34.0)
MCHC: 34 g/dL (ref 30.0–36.0)
MCV: 100 fL (ref 80.0–100.0)
Platelets: 105 10*3/uL — ABNORMAL LOW (ref 150–400)
RBC: 2.65 MIL/uL — ABNORMAL LOW (ref 4.22–5.81)
RDW: 13.4 % (ref 11.5–15.5)
WBC: 3.1 10*3/uL — ABNORMAL LOW (ref 4.0–10.5)
nRBC: 0 % (ref 0.0–0.2)

## 2020-08-28 LAB — COMPREHENSIVE METABOLIC PANEL
ALT: 19 U/L (ref 0–44)
AST: 23 U/L (ref 15–41)
Albumin: 2.8 g/dL — ABNORMAL LOW (ref 3.5–5.0)
Alkaline Phosphatase: 39 U/L (ref 38–126)
Anion gap: 9 (ref 5–15)
BUN: 6 mg/dL — ABNORMAL LOW (ref 8–23)
CO2: 26 mmol/L (ref 22–32)
Calcium: 8.7 mg/dL — ABNORMAL LOW (ref 8.9–10.3)
Chloride: 100 mmol/L (ref 98–111)
Creatinine, Ser: 0.78 mg/dL (ref 0.61–1.24)
GFR, Estimated: >60 mL/min (ref >60)
Glucose, Bld: 90 mg/dL (ref 70–99)
Potassium: 3.8 mmol/L (ref 3.5–5.1)
Sodium: 135 mmol/L (ref 135–145)
Total Bilirubin: 0.7 mg/dL (ref 0.3–1.2)
Total Protein: 5.8 g/dL — ABNORMAL LOW (ref 6.5–8.1)

## 2020-08-28 LAB — MAGNESIUM: Magnesium: 1.1 mg/dL — ABNORMAL LOW (ref 1.7–2.4)

## 2020-08-28 LAB — PHOSPHORUS: Phosphorus: 3.5 mg/dL (ref 2.5–4.6)

## 2020-08-28 SURGERY — EGD (ESOPHAGOGASTRODUODENOSCOPY)
Anesthesia: Monitor Anesthesia Care

## 2020-08-28 MED ORDER — PROPOFOL 500 MG/50ML IV EMUL
INTRAVENOUS | Status: DC | PRN
  Administered 2020-08-28: 200 ug/kg/min via INTRAVENOUS

## 2020-08-28 MED ORDER — LACTATED RINGERS IV SOLN: INTRAVENOUS | Status: DC | PRN

## 2020-08-28 MED ORDER — PHENYLEPHRINE 40 MCG/ML (10ML) SYRINGE FOR IV PUSH (FOR BLOOD PRESSURE SUPPORT)
PREFILLED_SYRINGE | INTRAVENOUS | Status: DC | PRN
  Administered 2020-08-28: 120 ug via INTRAVENOUS

## 2020-08-28 MED ORDER — PROPOFOL 10 MG/ML IV BOLUS
INTRAVENOUS | Status: DC | PRN
  Administered 2020-08-28: 50 mg via INTRAVENOUS
  Administered 2020-08-28: 25 mg via INTRAVENOUS
  Administered 2020-08-28: 50 mg via INTRAVENOUS

## 2020-08-28 MED ORDER — SUCRALFATE 1 GM/10ML PO SUSP: 1.0000 g | Freq: Three times a day (TID) | ORAL | 0 refills | Status: DC

## 2020-08-28 MED ORDER — SODIUM CHLORIDE 0.9 % IV SOLN: INTRAVENOUS | Status: DC

## 2020-08-28 MED ORDER — TAMSULOSIN HCL 0.4 MG PO CAPS: 0.4000 mg | ORAL_CAPSULE | Freq: Every day | ORAL | 1 refills | Status: AC

## 2020-08-28 MED ORDER — LIDOCAINE 2% (20 MG/ML) 5 ML SYRINGE
INTRAMUSCULAR | Status: DC | PRN
  Administered 2020-08-28: 100 mg via INTRAVENOUS

## 2020-08-28 MED ORDER — MAGNESIUM SULFATE 2 GM/50ML IV SOLN
2.0000 g | Freq: Once | INTRAVENOUS | Status: AC
  Administered 2020-08-28: 2 g via INTRAVENOUS
  Filled 2020-08-28: qty 50

## 2020-08-28 MED ORDER — SUCRALFATE 1 GM/10ML PO SUSP: 1.0000 g | Freq: Three times a day (TID) | ORAL | Status: DC

## 2020-08-28 NOTE — Anesthesia Procedure Notes (Signed)
Procedure Name: MAC Date/Time: 08/28/2020 11:58 AM Performed by: Renato Shin, CRNA Pre-anesthesia Checklist: Patient identified, Emergency Drugs available, Suction available and Patient being monitored Patient Re-evaluated:Patient Re-evaluated prior to induction Oxygen Delivery Method: Nasal cannula Preoxygenation: Pre-oxygenation with 100% oxygen Induction Type: IV induction Placement Confirmation: positive ETCO2 and breath sounds checked- equal and bilateral Dental Injury: Teeth and Oropharynx as per pre-operative assessment

## 2020-08-28 NOTE — Consult Note (Signed)
Referring Provider: Dr. Shawna Clamp Primary Care Physician:  [name], MD Primary Gastroenterologist:  [name] [facility])  Reason for Consultation:  Anemia  HPI: Eddie Taylor is a 65 y.o. male with history of RA (on Humira, leflunomide) and duodenal ulcer currently hospitalized for hypotension presenting for consultation of anemia and epigastric abdominal pain.  Patient was recently hospitalized from 1/29 to 08/22/2020 with hypotension and AKI secondary to enteritis.  He returned to the hospital on 2/4 due to hypotension.  Patient states he had nausea/vomiting starting the week of 1/24, which has now resolved.  He states some of the emesis was brown.  He has been having continued epigastric abdominal pain since that time.  He also reports some intermittent dysphagia to both liquids and solids, though states that it feels like it is in the upper throat.  He denies any melena, hematochezia, diarrhea, or constipation.  He reports approximately 10 pound weight loss over the last 1 to 2 weeks.  States his appetite has been decreased since his episode of nausea/vomiting.  He reports continued GERD despite 40 mg Protonix BID.  He was scheduled for outpatient EGD today.  He takes 800 mg ibuprofen as needed.  He states he has been taking them more recently over the last month because he broke several ribs, though he denies daily use.  Denies aspirin or blood thinner use.  He has a history of duodenal ulcer in 2014.  Denies family history of colon cancer or gastrointestinal malignancy.  Last colonoscopy was in 2010, last flex sig was 12/2018.  Prior GI procedures noted below:  EGD 03/2013: acute gastritis (bx: neg for H pylori), duodenal deformity (bx: benign mucosa)  02/2013 EGD for melena: Massive duodenal bulb ulcer with adherent clot, not amenable to endoscopic therapy  Flex sig 12/2018 for rectal pain: Congested and erythematous mucosa in the rectum, stool in rectum  Colonoscopy 03/2009:  diverticulosis, benign stricture secondary to diverticular disease  Past Medical History:  Diagnosis Date  . Anxiety   . Arthritis   . Depression   . Diverticulitis   . Gastric ulcer   . Hyperlipidemia   . Hypertension   . Syncope and collapse 02/14/2019    Past Surgical History:  Procedure Laterality Date  . COLON SURGERY    . COLOSTOMY    . ESOPHAGOGASTRODUODENOSCOPY N/A 03/13/2013   Procedure: ESOPHAGOGASTRODUODENOSCOPY (EGD);  Surgeon: Lear Ng, MD;  Location: [facility] ENDOSCOPY;  Service: Endoscopy;  Laterality: N/A;  . rheumatoid arthritis    . SURGERY SCROTAL / TESTICULAR  2014    Prior to Admission medications   Medication Sig Start Date End Date Taking? Authorizing Provider  Adalimumab 40 MG/0.8ML PSKT Inject 40 mg into the skin once a week.   Yes [provider]  allopurinol (ZYLOPRIM) 300 MG tablet Take 300 mg by mouth every evening. 01/17/19  Yes [provider]  divalproex (DEPAKOTE ER) 500 MG 24 hr tablet Take 2 tablets (1,000 mg total) by mouth at bedtime. 08/16/20  Yes Hurst, Dorothea Glassman, PA-C  Eszopiclone 3 MG TABS Take 3 mg by mouth at bedtime. 07/30/20  Yes [provider]  famotidine (PEPCID) 20 MG tablet Take 20 mg by mouth 2 (two) times daily. 04/30/20  Yes [provider]  hydrochlorothiazide (HYDRODIURIL) 25 MG tablet Take 25 mg by mouth daily.   Yes [provider]  lisinopril (ZESTRIL) 40 MG tablet Take 40 mg by mouth daily. 07/26/20  Yes [provider]  Magnesium Oxide 400 (  240 Mg) MG TABS Take 1 tablet (400 mg total) by mouth in the morning and at bedtime for 14 days. 08/22/20 09/05/20 Yes Rama, Venetia Maxon, MD  metoprolol succinate (TOPROL-XL) 50 MG 24 hr tablet Take 50 mg by mouth daily.  01/02/17  Yes [provider]  pantoprazole (PROTONIX) 40 MG tablet Take 1 tablet (40 mg total) by mouth 2 (two) times daily. 08/22/20  Yes Rama, Venetia Maxon, MD  pravastatin (PRAVACHOL) 40 MG tablet Take 40 mg by  mouth every evening. 12/05/16  Yes [provider]  tamsulosin (FLOMAX) 0.4 MG CAPS capsule Take 0.4 mg by mouth.   Yes [provider]  VASCEPA 1 g CAPS Take 2 g by mouth 2 (two) times daily. 12/25/16  Yes [provider]  HYDROcodone-acetaminophen (NORCO) 10-325 MG tablet Take 1 tablet by mouth every 6 (six) hours as needed for moderate pain or severe pain. 08/17/20   [provider]    Scheduled Meds: . divalproex  1,000 mg Oral QHS  . famotidine  20 mg Oral BID  . feeding supplement  237 mL Oral BID BM  . pantoprazole  40 mg Oral BID  . pravastatin  40 mg Oral QPM  . tamsulosin  0.4 mg Oral QPC breakfast  . zolpidem  5 mg Oral QHS   Continuous Infusions: . lactated ringers 75 mL/hr at 08/27/20 0221  . magnesium sulfate bolus IVPB     PRN Meds:.HYDROcodone-acetaminophen  Allergies as of 08/24/2020 - Review Complete 08/24/2020  Allergen Reaction Noted  . Bupropion Hives 07/11/2020  . Morphine and related  03/12/2013  . Prozac [fluoxetine hcl] Itching 05/06/2013  . Sulfa antibiotics Other (See Comments) 05/06/2013    Family History  Problem Relation Age of Onset  . Brain cancer Mother   . Hypertension Mother   . Alcohol abuse Mother   . Depression Mother   . Depression Sister   . Heart disease Sister   . Alcohol abuse Sister   . Hypertension Sister   . Drug abuse Brother   . Cirrhosis Maternal Grandfather   . Hypertension Maternal Grandfather   . Hyperlipidemia Maternal Grandfather   . Heart disease Maternal Grandmother   . Diverticulitis Half-Brother   . Depression Half-Brother   . Depression Half-Sister     Social History   Socioeconomic History  . Marital status: Married    Spouse name: Lorn Junes  . Number of children: 0  . Years of education: Not on file  . Highest education level: High school graduate  Occupational History  . Occupation: disability    Comment: Architect  Tobacco Use  . Smoking status: Current Every Day  Smoker    Packs/day: 0.25    Types: Cigarettes  . Smokeless tobacco: Never Used  Vaping Use  . Vaping Use: Never used  Substance and Sexual Activity  . Alcohol use: Yes    Alcohol/week: 12.0 standard drinks    Types: 12 Cans of beer per week  . Drug use: Not Currently    Types: Cocaine  . Sexual activity: Not on file    Comment: "now and then"  Other Topics Concern  . Not on file  Social History Narrative   Grew up in [place]. Was raised in [place]. Lived with his Mom and Step dad and mulitple 1/2 siblings.     He is married to [name] for 45 years. They didn't grow up together.   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   Step Dad was in [employer] and then did electrical work.   Mom worked in a bar her entire life.      No Armed forces logistics/support/administrative officer.    Had a DUI 30 years ago.   Catholic   No caffeine         Social Determinants of Health   Financial Resource Strain: Low Risk   . Difficulty of Paying Living Expenses: Not very hard  Food Insecurity: Not on file  Transportation Needs: Not on file  Physical Activity: Not on file  Stress: Not on file  Social Connections: Unknown  . Frequency of Communication with Friends and Family: Three times a week  . Frequency of Social Gatherings with Friends and Family: Twice a week  . Attends Religious Services: Not on file  . Active Member of Clubs or Organizations: No  . Attends Archivist Meetings: Never  . Marital Status: Married  Human resources officer Violence: Not on file    Review of Systems: Review of Systems  Constitutional: Positive for weight loss. Negative for chills and fever.  HENT: Negative for hearing loss and tinnitus.   Eyes: Negative for pain and redness.  Respiratory: Negative for cough and shortness of breath.   Cardiovascular: Negative for chest pain and palpitations.  Gastrointestinal: Positive for abdominal pain and heartburn. Negative  for blood in stool, constipation, diarrhea, melena, nausea and vomiting.  Genitourinary: Negative for flank pain and hematuria.  Musculoskeletal: Positive for falls and joint pain.  Skin: Negative for itching and rash.  Neurological: Negative for seizures and loss of consciousness.  Endo/Heme/Allergies: Negative for polydipsia. Does not bruise/bleed easily.  Psychiatric/Behavioral: Negative for substance abuse. The patient is not nervous/anxious.      Physical Exam: Vital signs: Vitals:   08/27/20 2326 08/28/20 0432  BP: 130/88 (!) 142/88  Pulse: 78 78  Resp: 18 16  Temp: 98.3 F (36.8 C) 98 F (36.7 C)  SpO2: 97% 98%   Last BM Date: 08/27/20  Physical Exam Vitals reviewed.  Constitutional:      General: He is not in acute distress. HENT:     Head: Normocephalic and atraumatic.     Nose: Nose normal. No congestion.     Mouth/Throat:     Mouth: Mucous membranes are moist.     Pharynx: Oropharynx is clear.  Eyes:     General: No scleral icterus.    Extraocular Movements: Extraocular movements intact.     Conjunctiva/sclera: Conjunctivae normal.  Cardiovascular:     Rate and Rhythm: Normal rate and regular rhythm.     Pulses: Normal pulses.  Pulmonary:     Effort: Pulmonary effort is normal. No respiratory distress.     Breath sounds: Normal breath sounds.  Abdominal:     General: Bowel sounds are normal. There is no distension.     Palpations: Abdomen is soft. There is no mass.     Tenderness: There is abdominal tenderness (moderate, epigastric). There is guarding (voluntary). There is no rebound.     Hernia: No hernia is present.  Musculoskeletal:        General: No swelling or tenderness.     Cervical back: Normal range of motion and neck supple.  Skin:    General: Skin is warm and dry.  Neurological:     General: No focal deficit present.     Mental Status: He is alert and oriented to person, place, and time.  Psychiatric:  Mood and Affect: Mood  normal.        Behavior: Behavior normal. Behavior is cooperative.      GI:  Lab Results: Recent Labs    08/26/20 0103 08/27/20 0707 08/28/20 0354  WBC 3.8* 2.8* 3.1*  HGB 9.1* 9.3* 9.0*  HCT 26.0* 27.8* 26.5*  PLT 102* 105* 105*   BMET Recent Labs    08/28/20 0354  NA 135  K 3.8  CL 100  CO2 26  GLUCOSE 90  BUN 6*  CREATININE 0.78  CALCIUM 8.7*   LFT Recent Labs    08/28/20 0354  PROT 5.8*  ALBUMIN 2.8*  AST 23  ALT 19  ALKPHOS 39  BILITOT 0.7   PT/INR No results for input(s): LABPROT, INR in the last 72 hours.   Studies/Results: No results found.  Impression: Epigastric abdominal pain, NSAID use, recent episode of nausea/vomiting with brown emesis; history of duodenal ulcer (2014) -Hgb 9.0 today, stable -Normal renal function: BUN 6/Cr 0.78  Anemia: normocytic to mildly macrocytic, no iron deficiency present  Recent hypotension and AKI  Hypomagnesemia: Magnesium 1.1  Hypoalbuminemia: Albumin 2.8  RA, on Humira and leflunomide   Plan: EGD today for further evaluation of anemia, epigastric abdominal pain, and dysphagia.  I thoroughly discussed the procedure with the patient to include nature, alternatives, benefits, and risks (including but not limited to bleeding, infection, perforation, anesthesia/cardiac and pulmonary complications).  Patient verbalized understanding and gave verbal consent to proceed with EGD.  Continue Protonix 40 mg PO BID.  Patient is due for outpatient screening colonoscopy, which can be discussed at next office visit.  [facility] will follow.    LOS: 1 day   Salley Slaughter  PA-C 08/28/2020, 8:37 AM  Contact #  [phone]

## 2020-08-28 NOTE — Op Note (Signed)
[facility] Patient Name: Eddie Taylor Procedure Date : 08/28/2020 MRN: [id number] Attending MD: Otis Brace , MD Date of Birth: [birth year] CSN: [id number] Age: 65 Admit Type: Inpatient Procedure:                Upper GI endoscopy Indications:              Heartburn, Follow-up of Helicobacter pylori Providers:                Otis Brace, MD, Josie Dixon, RN, Janee Morn, Technician Referring MD:              Medicines:                Sedation Administered by an Anesthesia Professional Complications:            No immediate complications. Estimated Blood Loss:     Estimated blood loss was minimal. Procedure:                Pre-Anesthesia Assessment:                           - Prior to the procedure, a History and Physical                            was performed, and patient medications and                            allergies were reviewed. The patient's tolerance of                            previous anesthesia was also reviewed. The risks                            and benefits of the procedure and the sedation                            options and risks were discussed with the patient.                            All questions were answered, and informed consent                            was obtained. Prior Anticoagulants: The patient has                            taken no previous anticoagulant or antiplatelet                            agents. ASA Grade Assessment: III - A patient with                            severe systemic disease. After reviewing the risks  and benefits, the patient was deemed in                            satisfactory condition to undergo the procedure.                           After obtaining informed consent, the endoscope was                            passed under direct vision. Throughout the                            procedure, the patient's blood pressure,  pulse, and                            oxygen saturations were monitored continuously. The                            GIF-H190 ([id number]) Olympus gastroscope was                            introduced through the mouth, and advanced to the                            second part of duodenum. The upper GI endoscopy was                            accomplished without difficulty. The patient                            tolerated the procedure well. Scope In: Scope Out: Findings:      Mucosal changes including ringed esophagus, longitudinal furrows,       circumferential folds and congestion (edema) were found in the entire       esophagus. Esophageal findings were graded using the Eosinophilic       Esophagitis Endoscopic Reference Score (EoE-EREFS) as: Rings Grade 1       Mild (subtle circumferential ridges seen on esophageal distension),       Exudates Grade 1 Mild (scattered white lesions involving less than 10       percent of the esophageal surface area) and Stricture present. Biopsies       were obtained from the proximal and distal esophagus with cold forceps       for histology of suspected eosinophilic esophagitis.      One benign-appearing, intrinsic moderate (circumferential scarring or       stenosis; an endoscope may pass) stenosis was found at the       gastroesophageal junction. The stenosis was traversed.      A small hiatal hernia was present.      Scattered mild inflammation characterized by congestion (edema) and       erythema was found in the gastric antrum and in the prepyloric region of       the stomach. Biopsies were taken with a cold forceps for histology.      A single small sessile polyp was found in the gastric body. Biopsies  were taken with a cold forceps for histology.      The cardia and gastric fundus were normal on retroflexion.      The duodenal bulb, first portion of the duodenum and second portion of       the duodenum were normal. Impression:                - Esophageal mucosal changes suggestive of                            eosinophilic esophagitis. Biopsied.                           - Benign-appearing esophageal stenosis.                           - Small hiatal hernia.                           - Gastritis. Biopsied.                           - A single gastric polyp. Biopsied.                           - Normal duodenal bulb, first portion of the                            duodenum and second portion of the duodenum. Recommendation:           - Return patient to hospital ward for ongoing care.                           - Resume previous diet.                           - Continue present medications.                           - Await pathology results.                           - Return to GI clinic as previously scheduled. Procedure Code(s):        --- Professional ---                           [phone], Esophagogastroduodenoscopy, flexible,                            transoral; with biopsy, single or multiple Diagnosis Code(s):        --- Professional ---                           K22.8, Other specified diseases of esophagus                           K22.2, Esophageal obstruction  K44.9, Diaphragmatic hernia without obstruction or                            gangrene                           K29.70, Gastritis, unspecified, without bleeding                           K31.7, Polyp of stomach and duodenum                           R12, Heartburn                           I75.79, Helicobacter pylori [H. pylori] as the                            cause of diseases classified elsewhere CPT copyright 2019 American Medical Association. All rights reserved. The codes documented in this report are preliminary and upon coder review may  be revised to meet current compliance requirements. Otis Brace, MD Otis Brace, MD 08/28/2020 12:11:39 PM Number of Addenda: 0

## 2020-08-28 NOTE — Anesthesia Preprocedure Evaluation (Addendum)
Anesthesia Evaluation  Patient identified by MRN, date of birth, ID band Patient awake    Reviewed: Allergy & Precautions, NPO status , Patient's Chart, lab work & pertinent test results  History of Anesthesia Complications Negative for: history of anesthetic complications  Airway Mallampati: II  TM Distance: >3 FB Neck ROM: Full    Dental   Pulmonary neg pulmonary ROS, Current Smoker and Patient abstained from smoking.,    Pulmonary exam normal        Cardiovascular hypertension, Normal cardiovascular exam     Neuro/Psych Anxiety Depression negative neurological ROS     GI/Hepatic PUD, (+)     substance abuse  alcohol use,   Endo/Other  negative endocrine ROS  Renal/GU negative Renal ROS  negative genitourinary   Musculoskeletal  (+) Arthritis , Rheumatoid disorders,    Abdominal   Peds  Hematology  (+) anemia ,   Anesthesia Other Findings   Reproductive/Obstetrics                            Anesthesia Physical Anesthesia Plan  ASA: III  Anesthesia Plan: MAC   Post-op Pain Management:    Induction: Intravenous  PONV Risk Score and Plan: Propofol infusion, TIVA and Treatment may vary due to age or medical condition  Airway Management Planned: Natural Airway, Nasal Cannula and Simple Face Mask  Additional Equipment: None  Intra-op Plan:   Post-operative Plan:   Informed Consent: I have reviewed the patients History and Physical, chart, labs and discussed the procedure including the risks, benefits and alternatives for the proposed anesthesia with the patient or authorized representative who has indicated his/her understanding and acceptance.       Plan Discussed with:   Anesthesia Plan Comments:         Anesthesia Quick Evaluation

## 2020-08-28 NOTE — Anesthesia Postprocedure Evaluation (Signed)
Anesthesia Post Note  Patient: Eddie Taylor  Procedure(s) Performed: ESOPHAGOGASTRODUODENOSCOPY (EGD) (N/A ) BIOPSY     Patient location during evaluation: PACU Anesthesia Type: MAC Level of consciousness: awake and alert Pain management: pain level controlled Vital Signs Assessment: post-procedure vital signs reviewed and stable Respiratory status: spontaneous breathing Cardiovascular status: stable Anesthetic complications: no   No complications documented.  Last Vitals:  Vitals:   08/28/20 1221 08/28/20 1227  BP: (!) 141/76 (!) 153/84  Pulse: 73 64  Resp: 18 12  Temp:    SpO2: 100% 100%    Last Pain:  Vitals:   08/28/20 1227  TempSrc:   PainSc: 0-No pain                 Nolon Nations

## 2020-08-28 NOTE — Progress Notes (Signed)
Discharge teaching complete. Meds, diet, activity, follow up appointments reviewed and all questions answered. Copy of instructions given to patient and prescriptions sent to pharmacy.  

## 2020-08-28 NOTE — Discharge Instructions (Signed)
Advised to follow-up with primary care physician in 1 week.   Advised to follow-up with rheumatologist tomorro. We will request primary care physician to resume blood pressure medications as tolerated.   Advised to follow-up with [name] is scheduled.

## 2020-08-28 NOTE — Brief Op Note (Signed)
08/24/2020 - 08/28/2020  12:20 PM  PATIENT:  Eddie Taylor  65 y.o. male  PRE-OPERATIVE DIAGNOSIS:  anemia, history of duodenal ulcer  POST-OPERATIVE DIAGNOSIS:  gastric biopsies - r/o h.pylori, polyp and esophageal biopsy r/o EOE  PROCEDURE:  Procedure(s): ESOPHAGOGASTRODUODENOSCOPY (EGD) (N/A) BIOPSY  SURGEON:  Surgeon(s) and Role:    * Iasia Forcier, MD - Primary  Findings ---------- -EGD showed esophageal mucosal changes suspicious for eosinophilic esophagitis.  Biopsies taken. -EGD also showed mild gastritis and one diminutive gastric polyp.  No evidence of ulcers.  Gastric biopsies taken.  Recommendations -------------------------- -Continue Protonix twice daily for now -Add Carafate -Follow biopsy results -Advance diet -No further inpatient GI work-up planned.  Follow-up with primary GI [name] after discharge.  GI will sign off.  Call us back if needed  Otis Brace MD, [place] 08/28/2020, 12:21 PM  Contact #  [phone]

## 2020-08-28 NOTE — Transfer of Care (Signed)
Immediate Anesthesia Transfer of Care Note  Patient: Eddie Taylor  Procedure(s) Performed: ESOPHAGOGASTRODUODENOSCOPY (EGD) (N/A ) BIOPSY  Patient Location: PACU  Anesthesia Type:General  Level of Consciousness: awake, drowsy and patient cooperative  Airway & Oxygen Therapy: Patient Spontanous Breathing and Patient connected to nasal cannula oxygen  Post-op Assessment: Report given to RN and Post -op Vital signs reviewed and stable  Post vital signs: Reviewed and stable  Last Vitals:  Vitals Value Taken Time  BP 120/72 08/28/20 1212  Temp    Pulse 84 08/28/20 1213  Resp 16 08/28/20 1213  SpO2 99 % 08/28/20 1213  Vitals shown include unvalidated device data.  Last Pain:  Vitals:   08/28/20 1211  TempSrc:   PainSc: 8          Complications: No complications documented.

## 2020-08-28 NOTE — Discharge Summary (Signed)
Physician Discharge Summary  Eddie Taylor:[id number] DOB: 1956/03/06 DOA: 08/24/2020  PCP: [name], MD  Admit date: 08/24/2020.  Discharge date: 08/28/2020  Admitted From: Home.  Disposition:   Home.  Recommendations for Outpatient Follow-up:  1. Follow up with PCP in 1-2 weeks. 2. Please obtain BMP/CBC in one week. 3. Advised to follow-up with rheumatologist tomorrow. 4. We will request PCP to resume blood pressure medications as tolerated.   5. Advised to follow-up with [name] as scheduled.  Home Health: None Equipment/Devices: None.  Discharge Condition: Stable CODE STATUS:Full code Diet recommendation: Heart Healthy   Brief [facility] Course: This 65 years old male with PMH significant for hypertension, GERD, peptic ulcer disease with H. pylori infection, chronic severe recurrent rheumatoid arthritis, major depressive disorder, insomnia, tobacco and alcohol abuse.  Patient was just discharged from [facility] on 08/22/2020 in the setting of severe sepsis, hypotension, AKI in the setting of enteritis with intractable nausea and vomiting.  At that time patient blood pressure was well controlled and was discharged home to continue home blood pressure medication but hold his leflunomide and Humira in the setting of pancytopenia and infection.  Patient was doing quite well for 24 hours after discharge but later has developed marked fatigue and notable blood pressure at home was 70/ 40.  Patient was brought back in the ED,  found to have blood pressure of 73/57. Patient remained quite symptomatic.  Patient was admitted for hypotension, all home blood pressure medications were discontinued.  Cosyntropin test was negative.  He reports significant improvement after discontinuation of blood pressure medications.  Patient was supposed to have EGD outpatient.  GI was consulted,  Patient underwent EGD,  found to have eosinophilic esophagitis, gastritis, recommended to take pantoprazole 40 mg  twice daily and Carafate 4 times daily and follow-up with [name] for biopsy results.  Patient seems improved after EGD, He feels better, has ambulated in the hallway and wants to be discharged.  Patient cleared from GI and patient being discharged.  He was managed for below problems.   Discharge Diagnoses:  Active Problems:   Hypotension  Acute symptomatic hypotension: >> Improving  -Initially thought to be secondary to polypharmacy.  - Antihypertensives have been discontinued. Patient has been on IV fluids.  -Hypotension has resolved. However, patient's cortisol was also noted to be low (4). -cosyntropin stimulation test completed was normal,. -Disposition will depend on above tests. -Continue toassess any signs of infections, though, none for now. -Patient may have been mildly volume depleted as well. - Patient reports better, denies any dizziness, plapitations  Rheumatoid arthritis -Continuing to hold leflunomide and Humira in the setting of recent infection.  GERD/peptic ulcer disease GI consulted,  underwent EGD,  found to have eosinophilic esophagitis and gastritis. GI recommended Carafate and pantoprazole 40 mg twice daily for now and follow-up with GI outpatient  Major depressive disorder -Continue home medications.  Insomnia -Resume home medications.  Tobacco/alcohol use -Counseled to quit tobacco and alcohol use.  Discharge Instructions  Discharge Instructions    Call MD for:  difficulty breathing, headache or visual disturbances   Complete by: As directed    Call MD for:  persistant dizziness or light-headedness   Complete by: As directed    Call MD for:  persistant nausea and vomiting   Complete by: As directed    Call MD for:  severe uncontrolled pain   Complete by: As directed    Diet - low sodium heart healthy   Complete  by: As directed    Diet Carb Modified   Complete by: As directed    Discharge instructions   Complete by: As  directed    Advised to follow-up with primary care physician in 1 week.   Advised to follow-up with rheumatologist tomorrow. We will request primary care physician to resume blood pressure medications as tolerated.   Advised to follow-up with [name] is scheduled.   Increase activity slowly   Complete by: As directed      Allergies as of 08/28/2020      Reactions   Bupropion Hives   Morphine And Related    "i go beside myself, i dont know. Its not pretty."    Prozac [fluoxetine Hcl] Itching   Sulfa Antibiotics Other (See Comments)   Doesn't feel well when taking       Medication List    STOP taking these medications   hydrochlorothiazide 25 MG tablet Commonly known as: HYDRODIURIL   lisinopril 40 MG tablet Commonly known as: ZESTRIL   metoprolol succinate 50 MG 24 hr tablet Commonly known as: TOPROL-XL     TAKE these medications   Adalimumab 40 MG/0.8ML Pskt Inject 40 mg into the skin once a week.   allopurinol 300 MG tablet Commonly known as: ZYLOPRIM Take 300 mg by mouth every evening.   divalproex 500 MG 24 hr tablet Commonly known as: Depakote ER Take 2 tablets (1,000 mg total) by mouth at bedtime.   Eszopiclone 3 MG Tabs Take 3 mg by mouth at bedtime.   famotidine 20 MG tablet Commonly known as: PEPCID Take 20 mg by mouth 2 (two) times daily.   HYDROcodone-acetaminophen 10-325 MG tablet Commonly known as: NORCO Take 1 tablet by mouth every 6 (six) hours as needed for moderate pain or severe pain.   Magnesium Oxide 400 (240 Mg) MG Tabs Take 1 tablet (400 mg total) by mouth in the morning and at bedtime for 14 days.   pantoprazole 40 MG tablet Commonly known as: PROTONIX Take 1 tablet (40 mg total) by mouth 2 (two) times daily.   pravastatin 40 MG tablet Commonly known as: PRAVACHOL Take 40 mg by mouth every evening.   sucralfate 1 GM/10ML suspension Commonly known as: CARAFATE Take 10 mLs (1 g total) by mouth 4 (four) times daily -  with  meals and at bedtime.   tamsulosin 0.4 MG Caps capsule Commonly known as: FLOMAX Take 1 capsule (0.4 mg total) by mouth daily. What changed: when to take this   Vascepa 1 g capsule Generic drug: icosapent Ethyl Take 2 g by mouth 2 (two) times daily.       Follow-up Information    [name], MD Follow up in 1 week(s).   Specialty: Internal Medicine Contact information: [place] [address] [phone]        Wilford Corner, MD Follow up in 2 week(s).   Specialty: Gastroenterology Contact information: [address] [address] [phone]              Allergies  Allergen Reactions  . Bupropion Hives  . Morphine And Related     "i go beside myself, i dont know. Its not pretty."   . Prozac [Fluoxetine Hcl] Itching  . Sulfa Antibiotics Other (See Comments)    Doesn't feel well when taking     Consultations:  Gastroneterology   Procedures/Studies: CT ABDOMEN PELVIS WO CONTRAST  Result Date: 08/19/2020 CLINICAL DATA:  Nausea and vomiting for 2 days.  Sepsis. EXAM: CT ABDOMEN AND PELVIS WITHOUT CONTRAST TECHNIQUE: Multidetector CT imaging of the abdomen and pelvis was performed following the standard protocol without IV contrast. COMPARISON:  01/07/2017 FINDINGS: Lower chest:  Calcified pulmonary nodules. Hepatobiliary: No focal liver abnormality.Cholelithiasis without evidence of cholecystitis. Pancreas: Unremarkable. Spleen: Unremarkable. Adrenals/Urinary Tract: Negative adrenals. No hydronephrosis or stone. Unremarkable bladder. Stomach/Bowel: Colectomy with ileostomy. There is stranding involving the small bowel mesentery diffusely without detected small bowel thickening. Small peristomal hernia which is chronic. No detected bowel obstruction. Vascular/Lymphatic: Atheromatous calcification of the aorta which is extensive. No mass or adenopathy. Reproductive:No acute finding Other: No ascites or  pneumoperitoneum. Musculoskeletal: Healing left lower rib fractures. Lumbar spine degeneration with L5-S1 foraminal stenosis. IMPRESSION: 1. Small bowel mesenteric stranding favoring enteritis given the clinical history. No bowel obstruction. 2. Ileostomy with chronic small parastomal hernia. 3.  Aortic Atherosclerosis (ICD10-I70.0). 4. Cholelithiasis without cholecystitis. Electronically Signed   By: Monte Fantasia M.D.   On: 08/19/2020 09:08   US RENAL  Result Date: 08/18/2020 CLINICAL DATA:  Generalized weakness.  Question urinary retention. EXAM: RENAL / URINARY TRACT ULTRASOUND COMPLETE COMPARISON:  Abdominal ultrasound 03/13/2013 FINDINGS: Right Kidney: Renal measurements: 10.9 x 5.0 x 4.7 cm = volume: 132 mL. Echogenicity within normal limits. No mass or hydronephrosis visualized. Left Kidney: Renal measurements: 11.8 x 5.4 x 5.3 cm = volume: 178 mL. Echogenicity within normal limits. No mass or hydronephrosis visualized. Bladder: Only minimally distended. No wall thickening for degree of distension. Other: None. IMPRESSION: 1. Unremarkable sonographic appearance of the kidneys. 2. Urinary bladder is only minimally distended. Electronically Signed   By: Keith Rake M.D.   On: 08/18/2020 18:34   DG Chest Port 1 View  Result Date: 08/18/2020 CLINICAL DATA:  Weakness and vomiting.  Possible sepsis. EXAM: PORTABLE CHEST 1 VIEW COMPARISON:  12/02/2008 FINDINGS: Atherosclerotic calcification of the aortic arch. Thoracic spondylosis noted. The lungs appear clear. No blunting of the costophrenic angles. Mild left lower lateral rib deformities likely from old healed fractures. Heart size within normal limits. Incidental calcified granulomas the right lung base. IMPRESSION: 1. No acute findings. A pulmonary cause for sepsis is not identified. 2. Atherosclerotic calcification of the aortic arch. 3. Thoracic spondylosis. Electronically Signed   By: Van Clines M.D.   On: 08/18/2020 17:54    ECHOCARDIOGRAM COMPLETE  Result Date: 08/19/2020    ECHOCARDIOGRAM REPORT   Patient Name:   Eddie Taylor Date of Exam: 08/19/2020 Medical Rec #:  [id number]     Height:       72.0 in Accession #:    [id number]    Weight:       179.0 lb Date of Birth:  [birth year]      BSA:          2.032 m Patient Age:    47 years      BP:           81/53 mmHg Patient Gender: M             HR:           91 bpm. Exam Location:  Inpatient Procedure: 2D Echo, Cardiac Doppler, Color Doppler and Intracardiac            Opacification Agent Indications:    Hypotension [[id number]]  History:        Patient has prior history of Echocardiogram examinations, most  recent 02/15/2019. Risk Factors:Hypertension, Dyslipidemia and                 Current Smoker.  Sonographer:    Vickie Epley RDCS Referring Phys: [id number] [place]  1. Left ventricular ejection fraction, by estimation, is 70 to 75%. The left ventricle has hyperdynamic function. The left ventricle has no regional wall motion abnormalities. Left ventricular diastolic parameters are consistent with Grade I diastolic dysfunction (impaired relaxation).  2. Right ventricular systolic function is normal. The right ventricular size is normal.  3. The mitral valve is normal in structure. No evidence of mitral valve regurgitation. No evidence of mitral stenosis.  4. The aortic valve was not well visualized. Aortic valve regurgitation is not visualized. No aortic stenosis is present.  5. The inferior vena cava is normal in size with greater than 50% respiratory variability, suggesting right atrial pressure of 3 mmHg. FINDINGS  Left Ventricle: Left ventricular ejection fraction, by estimation, is 70 to 75%. The left ventricle has hyperdynamic function. The left ventricle has no regional wall motion abnormalities. Definity contrast agent was given IV to delineate the left ventricular endocardial borders. The left ventricular internal cavity size was normal in size.  There is no left ventricular hypertrophy. Left ventricular diastolic parameters are consistent with Grade I diastolic dysfunction (impaired relaxation). Right Ventricle: The right ventricular size is normal. No increase in right ventricular wall thickness. Right ventricular systolic function is normal. Left Atrium: Left atrial size was normal in size. Right Atrium: Right atrial size was normal in size. Pericardium: There is no evidence of pericardial effusion. Mitral Valve: The mitral valve is normal in structure. No evidence of mitral valve regurgitation. No evidence of mitral valve stenosis. Tricuspid Valve: The tricuspid valve is normal in structure. Tricuspid valve regurgitation is not demonstrated. No evidence of tricuspid stenosis. Aortic Valve: The aortic valve was not well visualized. Aortic valve regurgitation is not visualized. No aortic stenosis is present. Pulmonic Valve: The pulmonic valve was normal in structure. Pulmonic valve regurgitation is not visualized. No evidence of pulmonic stenosis. Aorta: The aortic root is normal in size and structure. Venous: The inferior vena cava is normal in size with greater than 50% respiratory variability, suggesting right atrial pressure of 3 mmHg. IAS/Shunts: No atrial level shunt detected by color flow Doppler.  LEFT VENTRICLE PLAX 2D LVOT diam:     2.10 cm      Diastology LV SV:         98           LV e' medial:    7.33 cm/s LV SV Index:   48           LV E/e' medial:  11.4 LVOT Area:     3.46 cm     LV e' lateral:   8.73 cm/s                             LV E/e' lateral: 9.6  LV Volumes (MOD) LV vol d, MOD A4C: 122.0 ml LV vol s, MOD A4C: 40.4 ml LV SV MOD A4C:     122.0 ml RIGHT VENTRICLE RV S prime:     17.70 cm/s TAPSE (M-mode): 1.9 cm LEFT ATRIUM           Index       RIGHT ATRIUM          Index LA Vol (A4C): 22.3 ml 10.97 ml/m RA  Area:     7.78 cm                                   RA Volume:   14.30 ml 7.04 ml/m  AORTIC VALVE LVOT Vmax:   149.00 cm/s  LVOT Vmean:  110.000 cm/s LVOT VTI:    0.282 m MITRAL VALVE MV Area (PHT): 4.21 cm    SHUNTS MV Decel Time: 180 msec    Systemic VTI:  0.28 m MV E velocity: 83.90 cm/s  Systemic Diam: 2.10 cm MV A velocity: 93.60 cm/s MV E/A ratio:  0.90 Candee Furbish MD Electronically signed by Candee Furbish MD Signature Date/Time: 08/19/2020/11:21:08 AM    Final     EGD   Subjective: Patient was seen and examined at bedside.  Overnight events noted.  Patient reports feeling much better after endoscopy.   Patient reports feeling better and wants to be discharged home.  Patient is being discharged home  Discharge Exam: Vitals:   08/28/20 1221 08/28/20 1227  BP: (!) 141/76 (!) 153/84  Pulse: 73 64  Resp: 18 12  Temp:    SpO2: 100% 100%   Vitals:   08/28/20 1113 08/28/20 1211 08/28/20 1221 08/28/20 1227  BP: (!) 164/85 120/72 (!) 141/76 (!) 153/84  Pulse: 70 93 73 64  Resp: 14 (!) 24 18 12   Temp: 98.2 F (36.8 C) 98.1 F (36.7 C)    TempSrc: Oral Temporal    SpO2: 97% 100% 100% 100%  Weight:      Height:        General: Pt is alert, awake, not in acute distress Cardiovascular: RRR, S1/S2 +, no rubs, no gallops Respiratory: CTA bilaterally, no wheezing, no rhonchi Abdominal: Soft, NT, ND, bowel sounds + Extremities: no edema, no cyanosis    The results of significant diagnostics from this hospitalization (including imaging, microbiology, ancillary and laboratory) are listed below for reference.     Microbiology: Recent Results (from the past 240 hour(s))  SARS Coronavirus 2 by RT PCR (hospital order, performed in [facility] hospital lab) Nasopharyngeal Nasopharyngeal Swab     Status: None   Collection Time: 08/18/20  6:10 PM   Specimen: Nasopharyngeal Swab  Result Value Ref Range Status   SARS Coronavirus 2 NEGATIVE NEGATIVE Final    Comment: (NOTE) SARS-CoV-2 target nucleic acids are NOT DETECTED.  The SARS-CoV-2 RNA is generally detectable in upper and lower respiratory specimens  during the acute phase of infection. The lowest concentration of SARS-CoV-2 viral copies this assay can detect is 250 copies / mL. A negative result does not preclude SARS-CoV-2 infection and should not be used as the sole basis for treatment or other patient management decisions.  A negative result may occur with improper specimen collection / handling, submission of specimen other than nasopharyngeal swab, presence of viral mutation(s) within the areas targeted by this assay, and inadequate number of viral copies (<250 copies / mL). A negative result must be combined with clinical observations, patient history, and epidemiological information.  Fact Sheet for Patients:   StrictlyIdeas.no  Fact Sheet for Healthcare Providers: BankingDealers.co.za  This test is not yet approved or  cleared by the [place] FDA and has been authorized for detection and/or diagnosis of SARS-CoV-2 by FDA under an Emergency Use Authorization (EUA).  This EUA will remain in effect (meaning this test can be used) for the duration of the COVID-19 declaration under Section  564(b)(1) of the Act, 21 U.S.C. section 360bbb-3(b)(1), unless the authorization is terminated or revoked sooner.  Performed at [facility], [place] [address], [place], [place] [address]   Blood Culture (routine x 2)     Status: None   Collection Time: 08/18/20  6:10 PM   Specimen: BLOOD  Result Value Ref Range Status   Specimen Description BLOOD RIGHT ANTECUBITAL  Final   Special Requests   Final    BOTTLES DRAWN AEROBIC AND ANAEROBIC Blood Culture results may not be optimal due to an excessive volume of blood received in culture bottles   Culture   Final    NO GROWTH 5 DAYS Performed at [facility], [place] [address], [place], [place] [address]    Report Status 08/23/2020 FINAL  Final  Urine culture     Status: Abnormal   Collection Time: 08/18/20  9:49 PM   Specimen:  In/Out Cath Urine  Result Value Ref Range Status   Specimen Description IN/OUT CATH URINE  Final   Special Requests   Final    NONE Performed at [facility], [place] [address] [place], [place] [address]    Culture MULTIPLE ORGANISMS PRESENT, NONE PREDOMINANT (A)  Final   Report Status 08/20/2020 FINAL  Final  MRSA PCR Screening     Status: None   Collection Time: 08/19/20  8:55 PM   Specimen: Nasopharyngeal  Result Value Ref Range Status   MRSA by PCR NEGATIVE NEGATIVE Final    Comment:        The GeneXpert MRSA Assay (FDA approved for NASAL specimens only), is one component of a comprehensive MRSA colonization surveillance program. It is not intended to diagnose MRSA infection nor to guide or monitor treatment for MRSA infections. Performed at [facility], [place] [address], [place], [place] [address]   Culture, blood (routine x 2)     Status: None (Preliminary result)   Collection Time: 08/24/20 11:57 AM   Specimen: BLOOD LEFT ARM  Result Value Ref Range Status   Specimen Description BLOOD LEFT ARM  Final   Special Requests   Final    BOTTLES DRAWN AEROBIC AND ANAEROBIC Blood Culture adequate volume   Culture   Final    NO GROWTH 4 DAYS Performed at [facility], [place] [address] [place], [place] [address]    Report Status PENDING  Incomplete  Culture, blood (routine x 2)     Status: None (Preliminary result)   Collection Time: 08/24/20 12:50 PM   Specimen: BLOOD  Result Value Ref Range Status   Specimen Description BLOOD SITE NOT SPECIFIED  Final   Special Requests   Final    BOTTLES DRAWN AEROBIC AND ANAEROBIC Blood Culture adequate volume   Culture   Final    NO GROWTH 4 DAYS Performed at [facility], [address], [place],  [address]    Report Status PENDING  Incomplete  SARS Coronavirus 2 by RT PCR (hospital order, performed in [facility] hospital lab) Nasopharyngeal Nasopharyngeal Swab     Status: None   Collection Time:  08/24/20  1:14 PM   Specimen: Nasopharyngeal Swab  Result Value Ref Range Status   SARS Coronavirus 2 NEGATIVE NEGATIVE Final    Comment: (NOTE) SARS-CoV-2 target nucleic acids are NOT DETECTED.  The SARS-CoV-2 RNA is generally detectable in upper and lower respiratory specimens during the acute phase of infection. The lowest concentration of SARS-CoV-2 viral copies this assay  can detect is 250 copies / mL. A negative result does not preclude SARS-CoV-2 infection and should not be used as the sole basis for treatment or other patient management decisions.  A negative result may occur with improper specimen collection / handling, submission of specimen other than nasopharyngeal swab, presence of viral mutation(s) within the areas targeted by this assay, and inadequate number of viral copies (<250 copies / mL). A negative result must be combined with clinical observations, patient history, and epidemiological information.  Fact Sheet for Patients:   StrictlyIdeas.no  Fact Sheet for Healthcare Providers: BankingDealers.co.za  This test is not yet approved or  cleared by the [place] FDA and has been authorized for detection and/or diagnosis of SARS-CoV-2 by FDA under an Emergency Use Authorization (EUA).  This EUA will remain in effect (meaning this test can be used) for the duration of the COVID-19 declaration under Section 564(b)(1) of the Act, 21 U.S.C. section 360bbb-3(b)(1), unless the authorization is terminated or revoked sooner.  Performed at [facility], [place] [address], [place], [place] [address]      Labs: BNP (last 3 results) No results for input(s): BNP in the last 8760 hours. Basic Metabolic Panel: Recent Labs  Lab 08/22/20 0041 08/24/20 1201 08/25/20 0037 08/28/20 0354  NA 131* 132* 131* 135  K 4.6 4.9 4.5 3.8  CL 94* 99 101 100  CO2 27 23 23 26   GLUCOSE 92 118* 106* 90  BUN 8 17 14  6*   CREATININE 0.79 1.23 0.94 0.78  CALCIUM 7.7* 9.2 8.5* 8.7*  MG 1.0* 1.5*  --  1.1*  PHOS  --   --   --  3.5   Liver Function Tests: Recent Labs  Lab 08/24/20 1201 08/25/20 0037 08/28/20 0354  AST 36 27 23  ALT 33 25 19  ALKPHOS 54 45 39  BILITOT 0.5 0.6 0.7  PROT 7.4 6.0* 5.8*  ALBUMIN 3.4* 2.8* 2.8*   No results for input(s): LIPASE, AMYLASE in the last 168 hours. No results for input(s): AMMONIA in the last 168 hours. CBC: Recent Labs  Lab 08/24/20 1201 08/25/20 0037 08/26/20 0103 08/27/20 0707 08/28/20 0354  WBC 6.6 4.3 3.8* 2.8* 3.1*  NEUTROABS 3.8  --   --   --   --   HGB 10.5* 9.6* 9.1* 9.3* 9.0*  HCT 32.8* 27.4* 26.0* 27.8* 26.5*  MCV 103.8* 100.0 99.2 100.7* 100.0  PLT 128* 104* 102* 105* 105*   Cardiac Enzymes: No results for input(s): CKTOTAL, CKMB, CKMBINDEX, TROPONINI in the last 168 hours. BNP: Invalid input(s): POCBNP CBG: Recent Labs  Lab 08/24/20 1235  GLUCAP 104*   D-Dimer No results for input(s): DDIMER in the last 72 hours. Hgb A1c No results for input(s): HGBA1C in the last 72 hours. Lipid Profile No results for input(s): CHOL, HDL, LDLCALC, TRIG, CHOLHDL, LDLDIRECT in the last 72 hours. Thyroid function studies No results for input(s): TSH, T4TOTAL, T3FREE, THYROIDAB in the last 72 hours.  Invalid input(s): FREET3 Anemia work up No results for input(s): VITAMINB12, FOLATE, FERRITIN, TIBC, IRON, RETICCTPCT in the last 72 hours. Urinalysis    Component Value Date/Time   COLORURINE YELLOW 08/18/2020 2014   APPEARANCEUR CLEAR 08/18/2020 2014   LABSPEC 1.011 08/18/2020 2014   PHURINE 5.0 08/18/2020 2014   GLUCOSEU NEGATIVE 08/18/2020 2014   HGBUR MODERATE (A) 08/18/2020 2014   BILIRUBINUR NEGATIVE 08/18/2020 2014   [place] 08/18/2020 2014   PROTEINUR 30 (A) 08/18/2020 2014   UROBILINOGEN 0.2  03/13/2013 0157   NITRITE NEGATIVE 08/18/2020 2014   LEUKOCYTESUR NEGATIVE 08/18/2020 2014   Sepsis Labs Invalid input(s):  PROCALCITONIN,  WBC,  LACTICIDVEN Microbiology Recent Results (from the past 240 hour(s))  SARS Coronavirus 2 by RT PCR (hospital order, performed in [facility] hospital lab) Nasopharyngeal Nasopharyngeal Swab     Status: None   Collection Time: 08/18/20  6:10 PM   Specimen: Nasopharyngeal Swab  Result Value Ref Range Status   SARS Coronavirus 2 NEGATIVE NEGATIVE Final    Comment: (NOTE) SARS-CoV-2 target nucleic acids are NOT DETECTED.  The SARS-CoV-2 RNA is generally detectable in upper and lower respiratory specimens during the acute phase of infection. The lowest concentration of SARS-CoV-2 viral copies this assay can detect is 250 copies / mL. A negative result does not preclude SARS-CoV-2 infection and should not be used as the sole basis for treatment or other patient management decisions.  A negative result may occur with improper specimen collection / handling, submission of specimen other than nasopharyngeal swab, presence of viral mutation(s) within the areas targeted by this assay, and inadequate number of viral copies (<250 copies / mL). A negative result must be combined with clinical observations, patient history, and epidemiological information.  Fact Sheet for Patients:   StrictlyIdeas.no  Fact Sheet for Healthcare Providers: BankingDealers.co.za  This test is not yet approved or  cleared by the [place] FDA and has been authorized for detection and/or diagnosis of SARS-CoV-2 by FDA under an Emergency Use Authorization (EUA).  This EUA will remain in effect (meaning this test can be used) for the duration of the COVID-19 declaration under Section 564(b)(1) of the Act, 21 U.S.C. section 360bbb-3(b)(1), unless the authorization is terminated or revoked sooner.  Performed at [facility], [place] [address], [place], [place] [address]   Blood Culture (routine x 2)     Status: None   Collection Time: 08/18/20   6:10 PM   Specimen: BLOOD  Result Value Ref Range Status   Specimen Description BLOOD RIGHT ANTECUBITAL  Final   Special Requests   Final    BOTTLES DRAWN AEROBIC AND ANAEROBIC Blood Culture results may not be optimal due to an excessive volume of blood received in culture bottles   Culture   Final    NO GROWTH 5 DAYS Performed at [facility], [place] [address] [place], [place] [address]    Report Status 08/23/2020 FINAL  Final  Urine culture     Status: Abnormal   Collection Time: 08/18/20  9:49 PM   Specimen: In/Out Cath Urine  Result Value Ref Range Status   Specimen Description IN/OUT CATH URINE  Final   Special Requests   Final    NONE Performed at [facility], [place] [address] [place], [place] [address]    Culture MULTIPLE ORGANISMS PRESENT, NONE PREDOMINANT (A)  Final   Report Status 08/20/2020 FINAL  Final  MRSA PCR Screening     Status: None   Collection Time: 08/19/20  8:55 PM   Specimen: Nasopharyngeal  Result Value Ref Range Status   MRSA by PCR NEGATIVE NEGATIVE Final    Comment:        The GeneXpert MRSA Assay (FDA approved for NASAL specimens only), is one component of a comprehensive MRSA colonization surveillance program. It is not intended to diagnose MRSA infection nor to guide or monitor treatment for MRSA infections. Performed at [facility], [place] [address], [place], [place] [address]  Culture, blood (routine x 2)     Status: None (Preliminary result)   Collection Time: 08/24/20 11:57 AM   Specimen: BLOOD LEFT ARM  Result Value Ref Range Status   Specimen Description BLOOD LEFT ARM  Final   Special Requests   Final    BOTTLES DRAWN AEROBIC AND ANAEROBIC Blood Culture adequate volume   Culture   Final    NO GROWTH 4 DAYS Performed at [facility], [address], [place], [place] [address]    Report Status PENDING  Incomplete  Culture, blood (routine x 2)     Status: None (Preliminary result)   Collection Time: 08/24/20  12:50 PM   Specimen: BLOOD  Result Value Ref Range Status   Specimen Description BLOOD SITE NOT SPECIFIED  Final   Special Requests   Final    BOTTLES DRAWN AEROBIC AND ANAEROBIC Blood Culture adequate volume   Culture   Final    NO GROWTH 4 DAYS Performed at [facility], [address] [place], [place] [address]    Report Status PENDING  Incomplete  SARS Coronavirus 2 by RT PCR (hospital order, performed in [facility] hospital lab) Nasopharyngeal Nasopharyngeal Swab     Status: None   Collection Time: 08/24/20  1:14 PM   Specimen: Nasopharyngeal Swab  Result Value Ref Range Status   SARS Coronavirus 2 NEGATIVE NEGATIVE Final    Comment: (NOTE) SARS-CoV-2 target nucleic acids are NOT DETECTED.  The SARS-CoV-2 RNA is generally detectable in upper and lower respiratory specimens during the acute phase of infection. The lowest concentration of SARS-CoV-2 viral copies this assay can detect is 250 copies / mL. A negative result does not preclude SARS-CoV-2 infection and should not be used as the sole basis for treatment or other patient management decisions.  A negative result may occur with improper specimen collection / handling, submission of specimen other than nasopharyngeal swab, presence of viral mutation(s) within the areas targeted by this assay, and inadequate number of viral copies (<250 copies / mL). A negative result must be combined with clinical observations, patient history, and epidemiological information.  Fact Sheet for Patients:   StrictlyIdeas.no  Fact Sheet for Healthcare Providers: BankingDealers.co.za  This test is not yet approved or  cleared by the [place] FDA and has been authorized for detection and/or diagnosis of SARS-CoV-2 by FDA under an Emergency Use Authorization (EUA).  This EUA will remain in effect (meaning this test can be used) for the duration of the COVID-19 declaration under  Section 564(b)(1) of the Act, 21 U.S.C. section 360bbb-3(b)(1), unless the authorization is terminated or revoked sooner.  Performed at [facility], [place] [address], [place], [place] [address]      Time coordinating discharge: Over 30 minutes  SIGNED:   Shawna Clamp, MD  [facility] 08/28/2020, 2:04 PM Pager   If 7PM-7AM, please contact night-coverage [email or web address]

## 2020-08-29 ENCOUNTER — Encounter (HOSPITAL_COMMUNITY): Payer: Self-pay | Admitting: Gastroenterology

## 2020-08-29 LAB — CULTURE, BLOOD (ROUTINE X 2)
Culture: NO GROWTH
Culture: NO GROWTH
Special Requests: ADEQUATE
Special Requests: ADEQUATE

## 2020-08-30 LAB — SURGICAL PATHOLOGY

## 2020-09-04 ENCOUNTER — Other Ambulatory Visit: Payer: Self-pay | Admitting: Oncology

## 2020-09-04 DIAGNOSIS — D61818 Other pancytopenia: Secondary | ICD-10-CM

## 2020-09-11 NOTE — Progress Notes (Signed)
[place]  [phone] [phone]     ID: Eddie Taylor DOB: [birth year]  MR#: [id number]  ZHY#:[id number]  Patient Care Team: [name], MD as PCP - General (Internal Medicine) Alen Blew as Physician Assistant (Psychiatry) Dacia Capers, Virgie Dad, MD as Consulting Physician (Oncology) Wilford Corner, MD as Consulting Physician (Gastroenterology) Alen Blew as Physician Assistant (Psychiatry) Flossie Buffy., MD as Referring Physician (Cardiology) Gavin Pound, MD as Consulting Physician (Rheumatology) Chauncey Cruel, MD OTHER MD:  CHIEF COMPLAINT: Pancytopenia  CURRENT TREATMENT: Observation   HISTORY OF CURRENT ILLNESS: Eddie Taylor was admitted to [facility] 08/18/2020 with complaints of weakness and vomiting as well as progressive generalized weakness.  He was found to be hypotensive and with a metabolic acidosis, with acute kidney injury.  These issues were slowly resolved and by the time of discharge on 08/28/2020 the patient was essentially "back to baseline".  On 08/28/2020 he underwent EGD which suggested eosinophilic esophagitis.  Biopsies (MCS-2 2-797) confirmed this diagnosis.  He was noted to be pancytopenic during the admission.  On 08/27/2020 his total white cell count was 2.8, his hemoglobin was 9.3 and his platelet count 205,000.  The patient was referred here for further evaluation of pancytopenia.  The patient's subsequent history is as detailed below.   INTERVAL HISTORY: Eddie Taylor was evaluated in the hematology clinic on 09/12/2020 accompanied by his wife Eddie Taylor.   REVIEW OF SYSTEMS: Eddie Taylor tells me he "failed.gcmana ".  He drinks between 16 and 24 beers a week.  He uses no hard liquor.  He has a bottle of wine occasionally.  He has a colostomy in the right lower quadrant and he says that is his "friend".  He has seen no bleed there, no epistaxis, no unusual bruising, no blood in his  urine, and there has been no hematochezia or other evidence of bleeding except few weeks ago he did have some bleeding gums.  He does take aspirin daily as noted below.  COVID 19 VACCINATION STATUS: Status post Pfizer x2 with booster July 05, 2020   PAST MEDICAL HISTORY: Past Medical History:  Diagnosis Date  . Anxiety   . Arthritis   . Depression   . Diverticulitis   . Gastric ulcer   . Hyperlipidemia   . Hypertension   . Syncope and collapse 02/14/2019    PAST SURGICAL HISTORY: Past Surgical History:  Procedure Laterality Date  . BIOPSY  08/28/2020   Procedure: BIOPSY;  Surgeon: Otis Brace, MD;  Location: [facility];  Service: Gastroenterology;;  . COLON SURGERY    . COLOSTOMY    . ESOPHAGOGASTRODUODENOSCOPY N/A 03/13/2013   Procedure: ESOPHAGOGASTRODUODENOSCOPY (EGD);  Surgeon: Lear Ng, MD;  Location: [facility] ENDOSCOPY;  Service: Endoscopy;  Laterality: N/A;  . ESOPHAGOGASTRODUODENOSCOPY N/A 08/28/2020   Procedure: ESOPHAGOGASTRODUODENOSCOPY (EGD);  Surgeon: Otis Brace, MD;  Location: [facility] ENDOSCOPY;  Service: Gastroenterology;  Laterality: N/A;  . rheumatoid arthritis    . SURGERY SCROTAL / TESTICULAR  2014    FAMILY HISTORY: Family History  Problem Relation Age of Onset  . Brain cancer Mother   . Hypertension Mother   . Alcohol abuse Mother   . Depression Mother   . Depression Sister   . Heart disease Sister   . Alcohol abuse Sister   . Hypertension Sister   . Drug abuse Brother   . Cirrhosis Maternal Grandfather   . Hypertension Maternal Grandfather   . Hyperlipidemia Maternal Grandfather   .  Heart disease Maternal Grandmother   . Diverticulitis Taylor   . Depression Taylor   . Depression Eddie Taylor   The patient has very little information regarding his father.  He tells me his mother died from brain cancer.  He has 1 full brother and 2 full sisters, then 2 Taylor brothers and one Taylor sister.  None of them have cancer to his  knowledge.  There are no bleeding or clotting problems in his family that he knows of   SOCIAL HISTORY: (updated 08/2020)  Jeison and his wife of 36 years, Eddie Taylor, used to own a concrete business.  They have no children by they have 3 dogs and 2 cats.  There are lapsed Catholics.  ADVANCED DIRECTIVES: In the absence of any document to the contrary the patient's spouse is his healthcare power of attorney   HEALTH MAINTENANCE: Social History   Tobacco Use  . Smoking status: Current Every Day Smoker    Packs/day: 0.25    Types: Cigarettes  . Smokeless tobacco: Never Used  Vaping Use  . Vaping Use: Never used  Substance Use Topics  . Alcohol use: Yes    Alcohol/week: 12.0 standard drinks    Types: 12 Cans of beer per week  . Drug use: Not Currently    Types: Cocaine     Colonoscopy: Per [name]  PSA:   Bone density:    Allergies  Allergen Reactions  . Bupropion Hives  . Morphine And Related     "i go beside myself, i dont know. Its not pretty."   . Prozac [Fluoxetine Hcl] Itching  . Sulfa Antibiotics Other (See Comments)    Doesn't feel well when taking     Current Outpatient Medications  Medication Sig Dispense Refill  . Adalimumab 40 MG/0.8ML PSKT Inject 40 mg into the skin once a week.    Marland Kitchen allopurinol (ZYLOPRIM) 300 MG tablet Take 300 mg by mouth every evening.    . divalproex (DEPAKOTE ER) 500 MG 24 hr tablet Take 2 tablets (1,000 mg total) by mouth at bedtime. 180 tablet 1  . Eszopiclone 3 MG TABS Take 3 mg by mouth at bedtime.    . famotidine (PEPCID) 20 MG tablet Take 20 mg by mouth 2 (two) times daily.    Marland Kitchen HYDROcodone-acetaminophen (NORCO) 10-325 MG tablet Take 1 tablet by mouth every 6 (six) hours as needed for moderate pain or severe pain.    . pantoprazole (PROTONIX) 40 MG tablet Take 1 tablet (40 mg total) by mouth 2 (two) times daily.    . pravastatin (PRAVACHOL) 40 MG tablet Take 40 mg by mouth every evening.    . sucralfate (CARAFATE) 1 GM/10ML  suspension Take 10 mLs (1 g total) by mouth 4 (four) times daily -  with meals and at bedtime. 420 mL 0  . tamsulosin (FLOMAX) 0.4 MG CAPS capsule Take 1 capsule (0.4 mg total) by mouth daily. 30 capsule 1  . VASCEPA 1 g CAPS Take 2 g by mouth 2 (two) times daily.    Marland Kitchen VITAMIN D PO Take 50,000 Units by mouth once a week.     No current facility-administered medications for this visit.    OBJECTIVE: White man who appears older than stated age  75:   09/12/20 1504  BP: (!) 157/88  Pulse: 94  Resp: 18  Temp: 98.1 F (36.7 C)  SpO2: 100%     Body mass index is 24.15 kg/m.   Wt Readings from Last 3 Encounters:  09/12/20 178 lb 1.6 oz (80.8 kg)  08/24/20 177 lb 11.2 oz (80.6 kg)  08/22/20 176 lb 12.9 oz (80.2 kg)      ECOG FS:1 - Symptomatic but completely ambulatory  Ocular: Sclerae unicteric, pupils round and equal Ear-nose-throat: Wearing a mask Lymphatic: No cervical or supraclavicular adenopathy Lungs no rales or rhonchi Heart regular rate and rhythm Abd soft, nontender, positive bowel sounds, colostomy in right lower quadrant MSK no focal spinal tenderness Neuro: non-focal, well-oriented, positive affect   LAB RESULTS:  CMP     Component Value Date/Time   NA 135 08/28/2020 0354   NA 140 04/13/2020 1006   K 3.8 08/28/2020 0354   CL 100 08/28/2020 0354   CO2 26 08/28/2020 0354   GLUCOSE 90 08/28/2020 0354   BUN 6 (L) 08/28/2020 0354   BUN 10 04/13/2020 1006   CREATININE 0.78 08/28/2020 0354   CALCIUM 8.7 (L) 08/28/2020 0354   PROT 5.8 (L) 08/28/2020 0354   PROT 6.4 04/13/2020 1006   ALBUMIN 2.8 (L) 08/28/2020 0354   ALBUMIN 4.0 04/13/2020 1006   AST 23 08/28/2020 0354   ALT 19 08/28/2020 0354   ALKPHOS 39 08/28/2020 0354   BILITOT 0.7 08/28/2020 0354   BILITOT 0.6 04/13/2020 1006   GFRNONAA >60 08/28/2020 0354   GFRAA 120 04/13/2020 1006    No results found for: TOTALPROTELP, ALBUMINELP, A1GS, A2GS, BETS, BETA2SER, GAMS, MSPIKE, SPEI  Lab Results   Component Value Date   WBC 6.2 09/12/2020   NEUTROABS 3.2 09/12/2020   HGB 10.8 (L) 09/12/2020   HCT 32.6 (L) 09/12/2020   MCV 103.5 (H) 09/12/2020   PLT 141 (L) 09/12/2020    No results found for: LABCA2  No components found for: [id number]  No results for input(s): INR in the last 168 hours.  No results found for: LABCA2  No results found for: NWG956  No results found for: OZH086  No results found for: VHQ469  No results found for: CA2729  No components found for: HGQUANT  No results found for: CEA1 / No results found for: CEA1   No results found for: AFPTUMOR  No results found for: CHROMOGRNA  No results found for: KPAFRELGTCHN, LAMBDASER, KAPLAMBRATIO (kappa/lambda light chains)  No results found for: HGBA, HGBA2QUANT, HGBFQUANT, HGBSQUAN (Hemoglobinopathy evaluation)   Lab Results  Component Value Date   LDH 133 08/21/2020    Lab Results  Component Value Date   IRON 92 08/21/2020   TIBC 295 08/21/2020   IRONPCTSAT 31 08/21/2020   (Iron and TIBC)  Lab Results  Component Value Date   FERRITIN 422 (H) 08/21/2020    Urinalysis    Component Value Date/Time   COLORURINE YELLOW 08/18/2020 2014   APPEARANCEUR CLEAR 08/18/2020 2014   [place] 1.011 08/18/2020 2014   [place] 5.0 08/18/2020 2014   GLUCOSEU NEGATIVE 08/18/2020 2014   [place] (A) 08/18/2020 2014   [place] NEGATIVE 08/18/2020 2014   [place] NEGATIVE 08/18/2020 2014   PROTEINUR 30 (A) 08/18/2020 2014   UROBILINOGEN 0.2 03/13/2013 0157   NITRITE NEGATIVE 08/18/2020 2014   LEUKOCYTESUR NEGATIVE 08/18/2020 2014     STUDIES: CT ABDOMEN PELVIS WO CONTRAST  Result Date: 08/19/2020 CLINICAL DATA:  Nausea and vomiting for 2 days.  Sepsis. EXAM: CT ABDOMEN AND PELVIS WITHOUT CONTRAST TECHNIQUE: Multidetector CT imaging of the abdomen and pelvis was performed following the standard protocol without IV contrast. COMPARISON:  01/07/2017 FINDINGS: Lower chest:  Calcified pulmonary  nodules. Hepatobiliary: No focal liver abnormality.Cholelithiasis without evidence  of cholecystitis. Pancreas: Unremarkable. Spleen: Unremarkable. Adrenals/Urinary Tract: Negative adrenals. No hydronephrosis or stone. Unremarkable bladder. Stomach/Bowel: Colectomy with ileostomy. There is stranding involving the small bowel mesentery diffusely without detected small bowel thickening. Small peristomal hernia which is chronic. No detected bowel obstruction. Vascular/Lymphatic: Atheromatous calcification of the aorta which is extensive. No mass or adenopathy. Reproductive:No acute finding Other: No ascites or pneumoperitoneum. Musculoskeletal: Healing left lower rib fractures. Lumbar spine degeneration with L5-S1 foraminal stenosis. IMPRESSION: 1. Small bowel mesenteric stranding favoring enteritis given the clinical history. No bowel obstruction. 2. Ileostomy with chronic small parastomal hernia. 3.  Aortic Atherosclerosis (ICD10-I70.0). 4. Cholelithiasis without cholecystitis. Electronically Signed   By: Monte Fantasia M.D.   On: 08/19/2020 09:08   US RENAL  Result Date: 08/18/2020 CLINICAL DATA:  Generalized weakness.  Question urinary retention. EXAM: RENAL / URINARY TRACT ULTRASOUND COMPLETE COMPARISON:  Abdominal ultrasound 03/13/2013 FINDINGS: Right Kidney: Renal measurements: 10.9 x 5.0 x 4.7 cm = volume: 132 mL. Echogenicity within normal limits. No mass or hydronephrosis visualized. Left Kidney: Renal measurements: 11.8 x 5.4 x 5.3 cm = volume: 178 mL. Echogenicity within normal limits. No mass or hydronephrosis visualized. Bladder: Only minimally distended. No wall thickening for degree of distension. Other: None. IMPRESSION: 1. Unremarkable sonographic appearance of the kidneys. 2. Urinary bladder is only minimally distended. Electronically Signed   By: Keith Rake M.D.   On: 08/18/2020 18:34   DG Chest Port 1 View  Result Date: 08/18/2020 CLINICAL DATA:  Weakness and vomiting.  Possible  sepsis. EXAM: PORTABLE CHEST 1 VIEW COMPARISON:  12/02/2008 FINDINGS: Atherosclerotic calcification of the aortic arch. Thoracic spondylosis noted. The lungs appear clear. No blunting of the costophrenic angles. Mild left lower lateral rib deformities likely from old healed fractures. Heart size within normal limits. Incidental calcified granulomas the right lung base. IMPRESSION: 1. No acute findings. A pulmonary cause for sepsis is not identified. 2. Atherosclerotic calcification of the aortic arch. 3. Thoracic spondylosis. Electronically Signed   By: Van Clines M.D.   On: 08/18/2020 17:54   ECHOCARDIOGRAM COMPLETE  Result Date: 08/19/2020    ECHOCARDIOGRAM REPORT   Patient Name:   SHANTE MAYSONET Date of Exam: 08/19/2020 Medical Rec #:  [id number]     Height:       72.0 in Accession #:    [id number]    Weight:       179.0 lb Date of Birth:  December 26, 1955      BSA:          2.032 m Patient Age:    15 years      BP:           81/53 mmHg Patient Gender: M             HR:           91 bpm. Exam Location:  Inpatient Procedure: 2D Echo, Cardiac Doppler, Color Doppler and Intracardiac            Opacification Agent Indications:    Hypotension [[id number]]  History:        Patient has prior history of Echocardiogram examinations, most                 recent 02/15/2019. Risk Factors:Hypertension, Dyslipidemia and                 Current Smoker.  Sonographer:    Vickie Epley RDCS Referring Phys: [id number] [place]  1. Left ventricular ejection fraction, by estimation, is  70 to 75%. The left ventricle has hyperdynamic function. The left ventricle has no regional wall motion abnormalities. Left ventricular diastolic parameters are consistent with Grade I diastolic dysfunction (impaired relaxation).  2. Right ventricular systolic function is normal. The right ventricular size is normal.  3. The mitral valve is normal in structure. No evidence of mitral valve regurgitation. No evidence of mitral stenosis.  4. The  aortic valve was not well visualized. Aortic valve regurgitation is not visualized. No aortic stenosis is present.  5. The inferior vena cava is normal in size with greater than 50% respiratory variability, suggesting right atrial pressure of 3 mmHg. FINDINGS  Left Ventricle: Left ventricular ejection fraction, by estimation, is 70 to 75%. The left ventricle has hyperdynamic function. The left ventricle has no regional wall motion abnormalities. Definity contrast agent was given IV to delineate the left ventricular endocardial borders. The left ventricular internal cavity size was normal in size. There is no left ventricular hypertrophy. Left ventricular diastolic parameters are consistent with Grade I diastolic dysfunction (impaired relaxation). Right Ventricle: The right ventricular size is normal. No increase in right ventricular wall thickness. Right ventricular systolic function is normal. Left Atrium: Left atrial size was normal in size. Right Atrium: Right atrial size was normal in size. Pericardium: There is no evidence of pericardial effusion. Mitral Valve: The mitral valve is normal in structure. No evidence of mitral valve regurgitation. No evidence of mitral valve stenosis. Tricuspid Valve: The tricuspid valve is normal in structure. Tricuspid valve regurgitation is not demonstrated. No evidence of tricuspid stenosis. Aortic Valve: The aortic valve was not well visualized. Aortic valve regurgitation is not visualized. No aortic stenosis is present. Pulmonic Valve: The pulmonic valve was normal in structure. Pulmonic valve regurgitation is not visualized. No evidence of pulmonic stenosis. Aorta: The aortic root is normal in size and structure. Venous: The inferior vena cava is normal in size with greater than 50% respiratory variability, suggesting right atrial pressure of 3 mmHg. IAS/Shunts: No atrial level shunt detected by color flow Doppler.  LEFT VENTRICLE PLAX 2D LVOT diam:     2.10 cm       Diastology LV SV:         98           LV e' medial:    7.33 cm/s LV SV Index:   48           LV E/e' medial:  11.4 LVOT Area:     3.46 cm     LV e' lateral:   8.73 cm/s                             LV E/e' lateral: 9.6  LV Volumes (MOD) LV vol d, MOD A4C: 122.0 ml LV vol s, MOD A4C: 40.4 ml LV SV MOD A4C:     122.0 ml RIGHT VENTRICLE RV S prime:     17.70 cm/s TAPSE (M-mode): 1.9 cm LEFT ATRIUM           Index       RIGHT ATRIUM          Index LA Vol (A4C): 22.3 ml 10.97 ml/m RA Area:     7.78 cm                                   RA Volume:   14.30 ml  7.04 ml/m  AORTIC VALVE LVOT Vmax:   149.00 cm/s LVOT Vmean:  110.000 cm/s LVOT VTI:    0.282 m MITRAL VALVE MV Area (PHT): 4.21 cm    SHUNTS MV Decel Time: 180 msec    Systemic VTI:  0.28 m MV E velocity: 83.90 cm/s  Systemic Diam: 2.10 cm MV A velocity: 93.60 cm/s MV E/A ratio:  0.90 Candee Furbish MD Electronically signed by Candee Furbish MD Signature Date/Time: 08/19/2020/11:21:08 AM    Final      ELIGIBLE FOR AVAILABLE RESEARCH PROTOCOL: no  ASSESSMENT: 65 y.o. [place] man referred for evaluation of pancytopenia, in the setting of rheumatoid arthritis, depression, hypertriglyceridemia and alcohol dependence  REVIEW OF BLOOD FILM: The CBC has largely normalized, with a white cell count today of 6.2 and platelets 141,000.  Hemoglobin is also improved to 10.8, with an MCV of 103.5.  The smear shows occasional platelet clumps.  There is no left shift in the white cell series which are unremarkable.  The red cells show very rare schistocytes, no tailed poikilocytes, and no evidence of macrocytosis or hypochromia  PLAN: I discussed with Eddie Taylor and his wife that all blood cells to grow in the marrow bones.  There are 3 cell lines, red cells, which carry oxygen.  Not enough red cells is called anemia and he has that although it is improved.  A second cell line is the white cells, which are our immune system.  His were low but now are normal.  The third  third line her platelets.  These are clotting cells.  These are minimally low right now they were only moderately low previously.  Whenever we have all 3 cell lines involved we think of a possible bone marrow problem and that is the reason he is here.  He does have a bone marrow problem but it is not primary.  Namely his medications, which include alcohol, are having an effect on his bone marrow.  But I do not see any evidence of leukemia, nothing to suggest myeloma or lymphoma, and basically no primary bone marrow issue and this was very reassuring to him.    He is on Humira which is known to cause low white cells.  He is also on valproate which at high doses can cause pancytopenia.  Finally he is on vascepawhich has been reported to cause hemorrhage.  Quite aside from all this he is on a steady intake of alcohol and that is a known marrow suppressant.  It is difficult to sort out which of the above medications may be causing his cytopenias but certainly 1 that we could start with is alcohol.  He tells me he is currently down to 3 beers a night.  He is willing to decrease his intake gradually (although he did not have delirium when he stopped drinking during his hospitalization) although only after his birthday, which is 09/18/2019.  In short after his birthday he will cut down to 2 beers a night for a week and then to 1 beer a night for a week.  If he can then stay off all alcohol for 2 weeks (4 weeks would be better) he will call us and we will repeat a CBC.  That would give [place] some idea of how much of his marrow suppression is due to alcohol.  It could be that it is the main if not only because and in that case no adjustments would have to be made in any of his other  medications  Aside from that repeat I do not anticipate any further visits here by Eddie Taylor.  He has excellent follow-up through his all the physicians and he knows I will be glad to see him again at any point in the future if and when the  need arises.  Total encounter time 60 minutes.Eddie Taylor has a good understanding of the overall plan. he agrees with it. he will call with any problems that may develop before his next visit here.   Virgie Dad. Eddie Wlodarczyk, MD 09/12/2020 5:44 PM Medical Oncology and Hematology [facility] [place], [place] [address] Tel. [phone]    Fax. [phone]   This document serves as a record of services personally performed by Lurline Del, MD. It was created on his behalf by Wilburn Mylar, a trained medical scribe. The creation of this record is based on the scribe's personal observations and the provider's statements to them.   I, Lurline Del MD, have reviewed the above documentation for accuracy and completeness, and I agree with the above.    *Total Encounter Time as defined by the Centers for Medicare and Medicaid Services includes, in addition to the face-to-face time of a patient visit (documented in the note above) non-face-to-face time: obtaining and reviewing outside history, ordering and reviewing medications, tests or procedures, care coordination (communications with other health care professionals or caregivers) and documentation in the medical record.

## 2020-09-12 ENCOUNTER — Inpatient Hospital Stay: Payer: Medicare HMO | Attending: Oncology | Admitting: Oncology

## 2020-09-12 ENCOUNTER — Inpatient Hospital Stay: Payer: Medicare HMO

## 2020-09-12 ENCOUNTER — Other Ambulatory Visit: Payer: Self-pay

## 2020-09-12 VITALS — BP 157/88 | HR 94 | Temp 98.1°F | Resp 18 | Ht 72.0 in | Wt 178.1 lb

## 2020-09-12 DIAGNOSIS — Z8379 Family history of other diseases of the digestive system: Secondary | ICD-10-CM | POA: Diagnosis not present

## 2020-09-12 DIAGNOSIS — Z808 Family history of malignant neoplasm of other organs or systems: Secondary | ICD-10-CM | POA: Diagnosis not present

## 2020-09-12 DIAGNOSIS — M069 Rheumatoid arthritis, unspecified: Secondary | ICD-10-CM | POA: Insufficient documentation

## 2020-09-12 DIAGNOSIS — E781 Pure hyperglyceridemia: Secondary | ICD-10-CM | POA: Insufficient documentation

## 2020-09-12 DIAGNOSIS — Z79899 Other long term (current) drug therapy: Secondary | ICD-10-CM | POA: Diagnosis not present

## 2020-09-12 DIAGNOSIS — Z8249 Family history of ischemic heart disease and other diseases of the circulatory system: Secondary | ICD-10-CM | POA: Diagnosis not present

## 2020-09-12 DIAGNOSIS — F32A Depression, unspecified: Secondary | ICD-10-CM | POA: Insufficient documentation

## 2020-09-12 DIAGNOSIS — F17210 Nicotine dependence, cigarettes, uncomplicated: Secondary | ICD-10-CM | POA: Diagnosis not present

## 2020-09-12 DIAGNOSIS — D61818 Other pancytopenia: Secondary | ICD-10-CM | POA: Diagnosis not present

## 2020-09-12 DIAGNOSIS — Z8349 Family history of other endocrine, nutritional and metabolic diseases: Secondary | ICD-10-CM | POA: Diagnosis not present

## 2020-09-12 LAB — CBC WITH DIFFERENTIAL/PLATELET
Abs Immature Granulocytes: 0.01 10*3/uL (ref 0.00–0.07)
Basophils Absolute: 0.1 10*3/uL (ref 0.0–0.1)
Basophils Relative: 1 %
Eosinophils Absolute: 0.5 10*3/uL (ref 0.0–0.5)
Eosinophils Relative: 8 %
HCT: 32.6 % — ABNORMAL LOW (ref 39.0–52.0)
Hemoglobin: 10.8 g/dL — ABNORMAL LOW (ref 13.0–17.0)
Immature Granulocytes: 0 %
Lymphocytes Relative: 32 %
Lymphs Abs: 2 10*3/uL (ref 0.7–4.0)
MCH: 34.3 pg — ABNORMAL HIGH (ref 26.0–34.0)
MCHC: 33.1 g/dL (ref 30.0–36.0)
MCV: 103.5 fL — ABNORMAL HIGH (ref 80.0–100.0)
Monocytes Absolute: 0.5 10*3/uL (ref 0.1–1.0)
Monocytes Relative: 8 %
Neutro Abs: 3.2 10*3/uL (ref 1.7–7.7)
Neutrophils Relative %: 51 %
Platelets: 141 10*3/uL — ABNORMAL LOW (ref 150–400)
RBC: 3.15 MIL/uL — ABNORMAL LOW (ref 4.22–5.81)
RDW: 14.9 % (ref 11.5–15.5)
WBC: 6.2 10*3/uL (ref 4.0–10.5)
nRBC: 0 % (ref 0.0–0.2)

## 2020-09-12 LAB — SAVE SMEAR(SSMR), FOR PROVIDER SLIDE REVIEW

## 2020-09-12 LAB — SEDIMENTATION RATE: Sed Rate: 29 mm/hr — ABNORMAL HIGH (ref 0–16)

## 2020-09-13 ENCOUNTER — Telehealth: Payer: Self-pay | Admitting: Physician Assistant

## 2020-09-13 LAB — ANTINUCLEAR ANTIBODIES, IFA: ANA Ab, IFA: NEGATIVE

## 2020-09-13 NOTE — Telephone Encounter (Signed)
Pt called to report you had given him lab orders @ 1/27 apt. He has been in hospital and they have several lab results in system. Results 08/18/20 for labs you needed from another doctor. Just FYI for you to see all these labs. Contact pt if he needs more labs @ [phone]

## 2020-09-14 NOTE — Telephone Encounter (Signed)
Noted  

## 2020-10-09 ENCOUNTER — Other Ambulatory Visit: Payer: Self-pay

## 2020-10-09 ENCOUNTER — Encounter: Payer: Self-pay | Admitting: Physician Assistant

## 2020-10-09 ENCOUNTER — Ambulatory Visit (INDEPENDENT_AMBULATORY_CARE_PROVIDER_SITE_OTHER): Payer: Medicare HMO | Admitting: Physician Assistant

## 2020-10-09 DIAGNOSIS — M057A Rheumatoid arthritis with rheumatoid factor of other specified site without organ or systems involvement: Secondary | ICD-10-CM

## 2020-10-09 DIAGNOSIS — F1010 Alcohol abuse, uncomplicated: Secondary | ICD-10-CM | POA: Diagnosis not present

## 2020-10-09 DIAGNOSIS — R454 Irritability and anger: Secondary | ICD-10-CM | POA: Diagnosis not present

## 2020-10-09 DIAGNOSIS — F17200 Nicotine dependence, unspecified, uncomplicated: Secondary | ICD-10-CM | POA: Diagnosis not present

## 2020-10-09 DIAGNOSIS — G4700 Insomnia, unspecified: Secondary | ICD-10-CM

## 2020-10-09 MED ORDER — TRAZODONE HCL 100 MG PO TABS: 100.0000 mg | ORAL_TABLET | Freq: Every evening | ORAL | 1 refills | Status: DC | PRN

## 2020-10-09 NOTE — Progress Notes (Signed)
[facility]  Patient ID: Eddie Taylor,  MRN: [id number]  PCP: [name], MD  Date of Evaluation: 10/09/2020 Time spent:40 minutes  Chief Complaint:  Chief Complaint    Anxiety; Depression; Insomnia; Follow-up      HISTORY/CURRENT STATUS:  HPI For routine med check.  He has been in the hospital since our last visit. Had  sepsis, hypotension, acute kidney injury, and metabolic acidosis.  He feels much better.  Since he got home he has been cutting way back on alcohol consumption.  Only drinks 3 beers a day now.  Is also weaning off cigarettes.  Only smokes 4 cigarettes a day now.  Feels like psych meds are working well.  Feels more inner peace.  He is more able to enjoy things.  Things do not bother him as much even when his wife is fussing about something.  They were in [place] in the past couple weeks for his brother-in-law's funeral.  He is glad to have that behind him.  Energy and motivation are good since he got out of the hospital.  He is not isolating.  He still has trouble sleeping.  Johnnye Sima is not helping very much.  States it does help more than the Ambien but that is not saying much.  States he sleeps about 2 hours each night, wakes up and cannot go back to sleep.  Then he does nap sometimes during the day because he is so tired.  No suicidal or homicidal thoughts.  Denies increased energy with decreased need for sleep.  No increased libido, grandiosity, spending, impulsivity, or risky behavior.  The irritability is much better since being on the Depakote.  Denies paranoia, no hallucinations.  Denies dizziness, syncope, seizures, numbness, tingling, tremor, tics, unsteady gait, slurred speech, confusion.  He continues to have chronic joint pain, some days worse than others, due to rheumatoid arthritis.  Individual Medical History/ Review of Systems: Changes? :Yes  See HPI.  Past medications for mental health diagnoses include: Wellbutrin, Elavil, Effexor, Ambien,  Gabapentin, Xanax, Sonata, Lunesta  Allergies: Bupropion, Morphine and related, Prozac [fluoxetine hcl], and Sulfa antibiotics  Current Medications:  Current Outpatient Medications:  .  Adalimumab (HUMIRA) 40 MG/0.4ML PSKT, Inject into the skin., Disp: , Rfl:  .  allopurinol (ZYLOPRIM) 300 MG tablet, Take 300 mg by mouth every evening., Disp: , Rfl:  .  divalproex (DEPAKOTE ER) 500 MG 24 hr tablet, Take 2 tablets (1,000 mg total) by mouth at bedtime., Disp: 180 tablet, Rfl: 1 .  HYDROcodone-acetaminophen (NORCO) 10-325 MG tablet, Take 1 tablet by mouth every 6 (six) hours as needed for moderate pain or severe pain., Disp: , Rfl:  .  pantoprazole (PROTONIX) 40 MG tablet, Take 1 tablet (40 mg total) by mouth 2 (two) times daily., Disp: , Rfl:  .  pravastatin (PRAVACHOL) 40 MG tablet, Take 40 mg by mouth every evening., Disp: , Rfl:  .  traZODone (DESYREL) 100 MG tablet, Take 1-2 tablets (100-200 mg total) by mouth at bedtime as needed for sleep., Disp: 60 tablet, Rfl: 1 .  VASCEPA 1 g CAPS, Take 2 g by mouth 2 (two) times daily., Disp: , Rfl:  .  VITAMIN D PO, Take 50,000 Units by mouth once a week., Disp: , Rfl:  .  Adalimumab 40 MG/0.8ML PSKT, Inject 40 mg into the skin once a week. (Patient not taking: Reported on 10/09/2020), Disp: , Rfl:  .  famotidine (PEPCID) 20 MG tablet, Take 20 mg by mouth 2 (two) times  daily. (Patient not taking: Reported on 10/09/2020), Disp: , Rfl:  .  sucralfate (CARAFATE) 1 GM/10ML suspension, Take 10 mLs (1 g total) by mouth 4 (four) times daily -  with meals and at bedtime. (Patient not taking: Reported on 10/09/2020), Disp: 420 mL, Rfl: 0 .  tamsulosin (FLOMAX) 0.4 MG CAPS capsule, Take 1 capsule (0.4 mg total) by mouth daily. (Patient not taking: Reported on 10/09/2020), Disp: 30 capsule, Rfl: 1 Medication Side Effects: none  Family Medical/ Social History: Changes? No  MENTAL HEALTH EXAM:  There were no vitals taken for this visit.There is no height or weight  on file to calculate BMI.  General Appearance: Casual, Neat and Well Groomed  Eye Contact:  Good  Speech:  Clear and Coherent and Normal Rate  Volume:  Normal  Mood:  Euthymic  Affect:  Appropriate  Thought Process:  Goal Directed and Descriptions of Associations: Intact  Orientation:  Full (Time, Place, and Person)  Thought Content: Logical   Suicidal Thoughts:  No  Homicidal Thoughts:  No  Memory:  WNL  Judgement:  Good  Insight:  Good  Psychomotor Activity:  Normal  Concentration:  Concentration: Good and Attention Span: Good  Recall:  Good  Fund of Knowledge: Good  Language: Good  Assets:  Desire for Improvement  ADL's:  Intact  Cognition: WNL  Prognosis:  Good   Most recent pertinent labs: 08/24/2020 Valproic acid level 38 09/12/2020 CBC with differential White count was 6.2, hemoglobin 10.8, hematocrit 32.6, platelets 141. 08/28/2020 CMP sodium 135, glucose 90, LFTs were normal.   DIAGNOSES:    ICD-10-CM   1. Irritability and anger  R45.4   2. Insomnia, unspecified type  G47.00   3. Smoker  F17.200   4. Alcohol abuse  F10.10   5. Rheumatoid arthritis of other site with positive rheumatoid factor ([place])  M05.7A     Receiving Psychotherapy: Yes  [name]   RECOMMENDATIONS:  PDMP reviewed. I provided 40 minutes of face-to face time during this encounter, including time spent before and after the visit in record review and charting.  Also counseling concerning decreasing alcohol and cigarette use until he can completely stop.  Also discussed sleep hygiene.  Recommend using a noncontrolled substance such as trazodone or mirtazapine.  He would like to try 1 of those.  Benefits, risk and side effects were discussed.  He accepts.  Discontinue Lunesta. Start trazodone 100 mg, 1-2 nightly as needed sleep. Continue Depakote ER 500 mg, 2 po qhs. Continue therapy with [name]. Return in 2 months.  Donnal Moat, PA-C

## 2020-11-07 ENCOUNTER — Other Ambulatory Visit: Payer: Self-pay | Admitting: Physician Assistant

## 2020-11-09 ENCOUNTER — Other Ambulatory Visit: Payer: Self-pay | Admitting: Physician Assistant

## 2020-11-09 NOTE — Telephone Encounter (Signed)
Pt requesting refill for Depakote. Apt 5/27

## 2020-11-12 ENCOUNTER — Other Ambulatory Visit: Payer: Self-pay | Admitting: Physician Assistant

## 2020-11-12 ENCOUNTER — Telehealth: Payer: Self-pay | Admitting: Physician Assistant

## 2020-11-12 MED ORDER — DIVALPROEX SODIUM ER 500 MG PO TB24: 1000.0000 mg | ORAL_TABLET | Freq: Every day | ORAL | 1 refills | Status: DC

## 2020-11-12 NOTE — Telephone Encounter (Signed)
Pt called to report Rx for Depakote was sent as 500 mg. Was increased to 1000 mg. Please advise Pt @ [phone]

## 2020-11-12 NOTE — Telephone Encounter (Signed)
Rx was sent #90 take 1 daily.I may have sent this but I'm not sure.Should I advise pt to take 2 a day since it's 500 mg?

## 2020-11-12 NOTE — Telephone Encounter (Signed)
Pt informed

## 2020-11-12 NOTE — Telephone Encounter (Signed)
Yes have him take 2 pills.  Please apologize to him for our mistake.  It could have been me, I am not sure.  I sometimes forget to look at the quantity and the directions if that has been changed in between visits.  I sent in a new prescription showing that he would take 2 pills at night and there will be 180 pills.  I Sent it to [facility].

## 2020-12-08 ENCOUNTER — Other Ambulatory Visit: Payer: Self-pay | Admitting: Physician Assistant

## 2020-12-14 ENCOUNTER — Ambulatory Visit (INDEPENDENT_AMBULATORY_CARE_PROVIDER_SITE_OTHER): Payer: Medicare HMO | Admitting: Physician Assistant

## 2020-12-14 ENCOUNTER — Other Ambulatory Visit: Payer: Self-pay

## 2020-12-14 ENCOUNTER — Encounter: Payer: Self-pay | Admitting: Physician Assistant

## 2020-12-14 DIAGNOSIS — R454 Irritability and anger: Secondary | ICD-10-CM | POA: Diagnosis not present

## 2020-12-14 DIAGNOSIS — G4700 Insomnia, unspecified: Secondary | ICD-10-CM

## 2020-12-14 DIAGNOSIS — F17200 Nicotine dependence, unspecified, uncomplicated: Secondary | ICD-10-CM | POA: Diagnosis not present

## 2020-12-14 NOTE — Progress Notes (Signed)
[facility]  Patient ID: Eddie Taylor,  MRN: [id number]  PCP: [name], MD  Date of Evaluation: 12/14/2020 Time spent:30 minutes  Chief Complaint:  Chief Complaint    Follow-up      HISTORY/CURRENT STATUS: HPI For routine med check.  Doing well. Quit smoking every day! Smokes only 3 cigs every other day now! Sometimes he goes 2 days. And is only drinking 3 beer per day!  Patient denies loss of interest in usual activities and is able to enjoy things.  Denies decreased energy or motivation.  Appetite has not changed.  No extreme sadness, tearfulness, or feelings of hopelessness.  Denies any changes in concentration, making decisions or remembering things.  Sleeps well with the trazodone.  Denies suicidal or homicidal thoughts.  Patient is having a bit more irritability, not going from 0-100 real fast like he used to. Goes from 0-50 now but it is not too much of a problem.  Is going through some drama with family, so he feels that the irritability is warranted.  Ask if he can take the Depakote during the day instead of the night to see if that will help.  Denies increased energy with decreased need for sleep, no increased talkativeness, no racing thoughts, no impulsivity or risky behaviors, no increased spending, no increased libido, no grandiosity, and no hallucinations.  Denies dizziness, syncope, seizures, numbness, tingling, tremor, tics, unsteady gait, slurred speech, confusion. Denies muscle or joint pain, stiffness, or dystonia.  Individual Medical History/ Review of Systems: Changes? :Yes   Had endoscopy yesterday. Results pending. Has been having a lot of heartburn.  Past medications for mental health diagnoses include: Wellbutrin, Elavil, Effexor, Ambien, Gabapentin, Xanax, Sonata, Lunesta  Allergies: Bupropion, Morphine and related, Prozac [fluoxetine hcl], and Sulfa antibiotics  Current Medications:  Current Outpatient Medications:  .  Adalimumab  (HUMIRA) 40 MG/0.4ML PSKT, Inject into the skin., Disp: , Rfl:  .  Adalimumab 40 MG/0.8ML PSKT, Inject 40 mg into the skin once a week., Disp: , Rfl:  .  allopurinol (ZYLOPRIM) 300 MG tablet, Take 300 mg by mouth every evening., Disp: , Rfl:  .  divalproex (DEPAKOTE ER) 500 MG 24 hr tablet, Take 2 tablets (1,000 mg total) by mouth at bedtime., Disp: 180 tablet, Rfl: 1 .  esomeprazole (NEXIUM) 40 MG capsule, Take 40 mg by mouth 2 (two) times daily before a meal., Disp: , Rfl:  .  HYDROcodone-acetaminophen (NORCO) 10-325 MG tablet, Take 1 tablet by mouth every 6 (six) hours as needed for moderate pain or severe pain., Disp: , Rfl:  .  traZODone (DESYREL) 100 MG tablet, TAKE 1-2 TABLETS BY MOUTH AT BEDTIME AS NEEDED FOR SLEEP, Disp: 60 tablet, Rfl: 0 .  VASCEPA 1 g CAPS, Take 2 g by mouth 2 (two) times daily., Disp: , Rfl:  .  VITAMIN D PO, Take 50,000 Units by mouth once a week., Disp: , Rfl:  .  famotidine (PEPCID) 20 MG tablet, Take 20 mg by mouth 2 (two) times daily. (Patient not taking: No sig reported), Disp: , Rfl:  .  pantoprazole (PROTONIX) 40 MG tablet, Take 1 tablet (40 mg total) by mouth 2 (two) times daily. (Patient not taking: Reported on 12/14/2020), Disp: , Rfl:  .  pravastatin (PRAVACHOL) 40 MG tablet, Take 40 mg by mouth every evening. (Patient not taking: Reported on 12/14/2020), Disp: , Rfl:  .  sucralfate (CARAFATE) 1 GM/10ML suspension, Take 10 mLs (1 g total) by mouth 4 (four) times daily -  with meals and at bedtime. (Patient not taking: No sig reported), Disp: 420 mL, Rfl: 0 .  tamsulosin (FLOMAX) 0.4 MG CAPS capsule, Take 1 capsule (0.4 mg total) by mouth daily. (Patient not taking: No sig reported), Disp: 30 capsule, Rfl: 1 Medication Side Effects: none  Family Medical/ Social History: Changes? No  MENTAL HEALTH EXAM:  There were no vitals taken for this visit.There is no height or weight on file to calculate BMI.  General Appearance: Casual and Fairly Groomed  Eye  Contact:  Good  Speech:  Clear and Coherent and Normal Rate  Volume:  Normal  Mood:  Euthymic  Affect:  Appropriate  Thought Process:  Goal Directed and Descriptions of Associations: Circumstantial  Orientation:  Full (Time, Place, and Person)  Thought Content: Logical   Suicidal Thoughts:  No  Homicidal Thoughts:  No  Memory:  WNL  Judgement:  Good  Insight:  Good  Psychomotor Activity:  Normal  Concentration:  Concentration: Good  Recall:  Good  Fund of Knowledge: Good  Language: Good  Assets:  Desire for Improvement  ADL's:  Intact  Cognition: WNL  Prognosis:  Good   08/24/2020 Depakote level 38 See chart for other labs done in February.  DIAGNOSES:    ICD-10-CM   1. Irritability and anger  R45.4   2. Insomnia, unspecified type  G47.00   3. Smoker  F17.200     Receiving Psychotherapy: Yes With [name]   RECOMMENDATIONS:  PDMP was reviewed. I provided 30 minutes of face-to-face time during this encounter, including time spent before and after the visit in records review, medical decision making, and charting. I am glad to see him doing well!  We agreed for him to stay on the same dose of Depakote but he can switch from evening dosing to morning dosing.  If it causes him to be too drowsy, he should take 1 in the morning and 1 in the afternoon or evening.  If the irritability does not resolve when the family issues have died down, he will let me know and we can increase the Depakote dose.  His last level was in February and it was a little low so we have plenty of room to increase it.  He verbalizes understanding. Congratulations on cutting way back on the alcohol and cigarettes!  Keep up the good work. Continue Depakote ER 500 mg, 2 p.o. daily. Continue trazodone 100 mg, 1-2 nightly as needed sleep. Continue therapy with [name] Return in 3 months.  Donnal Moat, PA-C

## 2021-01-11 ENCOUNTER — Other Ambulatory Visit: Payer: Self-pay | Admitting: Physician Assistant

## 2021-02-18 ENCOUNTER — Other Ambulatory Visit: Payer: Self-pay | Admitting: Physician Assistant

## 2021-03-12 ENCOUNTER — Encounter: Payer: Self-pay | Admitting: Physician Assistant

## 2021-03-12 ENCOUNTER — Ambulatory Visit: Payer: Medicare HMO | Admitting: Physician Assistant

## 2021-03-12 ENCOUNTER — Other Ambulatory Visit: Payer: Self-pay

## 2021-03-12 DIAGNOSIS — M057A Rheumatoid arthritis with rheumatoid factor of other specified site without organ or systems involvement: Secondary | ICD-10-CM

## 2021-03-12 DIAGNOSIS — Z7289 Other problems related to lifestyle: Secondary | ICD-10-CM

## 2021-03-12 DIAGNOSIS — G4700 Insomnia, unspecified: Secondary | ICD-10-CM | POA: Diagnosis not present

## 2021-03-12 DIAGNOSIS — Z789 Other specified health status: Secondary | ICD-10-CM

## 2021-03-12 DIAGNOSIS — Z79899 Other long term (current) drug therapy: Secondary | ICD-10-CM

## 2021-03-12 DIAGNOSIS — F39 Unspecified mood [affective] disorder: Secondary | ICD-10-CM | POA: Diagnosis not present

## 2021-03-12 DIAGNOSIS — F1090 Alcohol use, unspecified, uncomplicated: Secondary | ICD-10-CM

## 2021-03-12 MED ORDER — TRAZODONE HCL 100 MG PO TABS: 100.0000 mg | ORAL_TABLET | Freq: Every evening | ORAL | 3 refills | Status: DC | PRN

## 2021-03-12 NOTE — Progress Notes (Signed)
[facility]  Patient ID: Eddie Taylor,  MRN: [id number]  PCP: [name], MD  Date of Evaluation: 03/12/2021 Time spent:30 minutes  Chief Complaint:  Chief Complaint   Follow-up; Other; Insomnia      HISTORY/CURRENT STATUS: HPI For routine med check.  Eddie Taylor states he is doing really well and he feels like the medications are working.  He and his wife are getting along pretty well, will be going to the beach in a few days to celebrate their 40+ years wedding anniversary.   States he has cut way back on the alcohol and feels better for it.  He has also cut down cigarettes to around 1/2 pack/month!  Says he does not really crave them but sometimes he just feels like smoking.  Energy and motivation are good.  Appetite has not changed.  No extreme sadness, tearfulness, or feelings of hopelessness.  Denies any changes in concentration, making decisions or remembering things.  Sleeps well most of the time.  Denies suicidal or homicidal thoughts.  Patient denies increased energy with decreased need for sleep, no increased talkativeness, no racing thoughts, no impulsivity or risky behaviors, no increased spending, no increased libido, no grandiosity, no increased irritability or anger, no paranoia, and no hallucinations.  Denies dizziness, syncope, seizures, numbness, tingling, tremor, tics, unsteady gait, slurred speech, confusion. Denies muscle or joint pain, stiffness, or dystonia.  Individual Medical History/ Review of Systems: Changes? :No     Past medications for mental health diagnoses include: Wellbutrin, Elavil, Effexor, Ambien, Gabapentin, Xanax, Sonata, Lunesta  Allergies: Bupropion, Morphine and related, Prozac [fluoxetine hcl], and Sulfa antibiotics  Current Medications:  Current Outpatient Medications:    Adalimumab (HUMIRA) 40 MG/0.4ML PSKT, Inject into the skin., Disp: , Rfl:    Adalimumab 40 MG/0.8ML PSKT, Inject 40 mg into the skin once a week., Disp: ,  Rfl:    allopurinol (ZYLOPRIM) 300 MG tablet, Take 300 mg by mouth every evening., Disp: , Rfl:    divalproex (DEPAKOTE ER) 500 MG 24 hr tablet, Take 2 tablets (1,000 mg total) by mouth at bedtime., Disp: 180 tablet, Rfl: 1   esomeprazole (NEXIUM) 40 MG capsule, Take 40 mg by mouth 2 (two) times daily before a meal., Disp: , Rfl:    HYDROcodone-acetaminophen (NORCO) 10-325 MG tablet, Take 1 tablet by mouth every 6 (six) hours as needed for moderate pain or severe pain., Disp: , Rfl:    lisinopril (ZESTRIL) 10 MG tablet, Take 10 mg by mouth daily., Disp: , Rfl:    pravastatin (PRAVACHOL) 40 MG tablet, Take 40 mg by mouth every evening., Disp: , Rfl:    VASCEPA 1 g CAPS, Take 2 g by mouth 2 (two) times daily., Disp: , Rfl:    VITAMIN D PO, Take 50,000 Units by mouth once a week., Disp: , Rfl:    famotidine (PEPCID) 20 MG tablet, Take 20 mg by mouth 2 (two) times daily. (Patient not taking: No sig reported), Disp: , Rfl:    pantoprazole (PROTONIX) 40 MG tablet, Take 1 tablet (40 mg total) by mouth 2 (two) times daily. (Patient not taking: No sig reported), Disp: , Rfl:    sucralfate (CARAFATE) 1 GM/10ML suspension, Take 10 mLs (1 g total) by mouth 4 (four) times daily -  with meals and at bedtime. (Patient not taking: No sig reported), Disp: 420 mL, Rfl: 0   tamsulosin (FLOMAX) 0.4 MG CAPS capsule, Take 1 capsule (0.4 mg total) by mouth daily. (Patient not taking: No sig  reported), Disp: 30 capsule, Rfl: 1   traZODone (DESYREL) 100 MG tablet, Take 1-2 tablets (100-200 mg total) by mouth at bedtime as needed. for sleep, Disp: 180 tablet, Rfl: 3 Medication Side Effects: none  Family Medical/ Social History: Changes? No  MENTAL HEALTH EXAM:  There were no vitals taken for this visit.There is no height or weight on file to calculate BMI.  General Appearance: Casual and Well Groomed  Eye Contact:  Good  Speech:  Clear and Coherent and Normal Rate  Volume:  Normal  Mood:  Euthymic  Affect:   Appropriate  Thought Process:  Goal Directed and Descriptions of Associations: Circumstantial  Orientation:  Full (Time, Place, and Person)  Thought Content: Logical   Suicidal Thoughts:  No  Homicidal Thoughts:  No  Memory:  WNL  Judgement:  Good  Insight:  Good  Psychomotor Activity:  Normal  Concentration:  Concentration: Good and Attention Span: Fair  Recall:  Good  Fund of Knowledge: Good  Language: Good  Assets:  Desire for Improvement  ADL's:  Intact  Cognition: WNL  Prognosis:  Good   08/24/2020 Depakote level 38  DIAGNOSES:    ICD-10-CM   1. Episodic mood disorder ([facility])  F39 Valproic acid level    2. Alcohol use  Z72.89     3. Insomnia, unspecified type  G47.00     4. Rheumatoid arthritis of other site with positive rheumatoid factor ([facility])  M05.7A     5. Encounter for long-term (current) use of medications  Z79.899 Valproic acid level       Receiving Psychotherapy: Yes With [name]   RECOMMENDATIONS:  PDMP was reviewed.  Last Lunesta filled 09/04/2020.  He is on hydrocodone and buprenorphine, known to me. I provided 30 minutes of face to face time during this encounter, including time spent before and after the visit in records review, medical decision making, and charting.  I am glad to see him doing so well!  Encouraged him to quit alcohol and smoking altogether but he states he may, or may not.  He has done really well as far as cutting way back on both smoking and alcohol, obviously he is at a good place so if he is never able or wanting to quit, I am still happy with his progress. Continue Depakote ER 500 mg, 2 p.o. daily. Continue trazodone 100 mg, 1-2 nightly as needed sleep. Labs ordered as above. Continue therapy with [name]. Return in 4 months.  Donnal Moat, PA-C

## 2021-05-15 ENCOUNTER — Other Ambulatory Visit: Payer: Self-pay | Admitting: Physician Assistant

## 2021-05-22 ENCOUNTER — Telehealth: Payer: Self-pay | Admitting: *Deleted

## 2021-05-22 NOTE — Telephone Encounter (Signed)
This RN spoke with the patient's wife per call to scheduling requesting an appointment.  Note pt was seen Feb 2022 due to blood abnormalities- with work up not requiring further follow up in this office.  Mrs Delph states " he had labs done recently at Dr Trudie Reed and his numbers are like they were before and she wanted him further evaluated ".  This RN asked for Mrs Martenson to have Dr Trudie Reed office fax recent labs with concern to this RN at pod fax number.  This RN upon receiving data will review with MD for further recommendations.  Of note this RN did inform Mrs Dolman that Dr Jannifer Rodney is retiring and pt may need to be seen by another provider. She stated understanding and appreciation of assistance.

## 2021-05-24 ENCOUNTER — Telehealth: Payer: Self-pay | Admitting: *Deleted

## 2021-05-24 NOTE — Telephone Encounter (Signed)
This RN obtained labs from Dr Trudie Reed per concern from pt's wife for need for re-consult due to drop in lab values.  Per discussion with wife post receiving labs inquiry made for pt's current ETOH and cigarette use.  She states pt is drinking approx 4-5 beers a day and smoking 1/2 pack of cigarettes.  She states above has been his normal habit since he decreased post visit here and had labs rechecked in April 2022 with noted improvement.  She states Eddie Taylor is " very weak ".  Per discussion she states Dr Trudie Reed recommended pt to be seen again for further evaluation due to ongoing drop in labs post decreasing ETOH and cigarette use from Feb 2022.  This note with pt's labs will be given to MD for review and further recommendation.  Mrs Eddie Taylor verbalized understanding Dr Jannifer Rodney will be retiring and if needed to be seen again pt may need to be established with another of our hematologist.  Mrs Eddie Taylor verbalized understanding and appreciation of call.

## 2021-05-27 ENCOUNTER — Other Ambulatory Visit: Payer: Self-pay | Admitting: Oncology

## 2021-05-27 DIAGNOSIS — D61818 Other pancytopenia: Secondary | ICD-10-CM

## 2021-05-28 ENCOUNTER — Telehealth: Payer: Self-pay | Admitting: Oncology

## 2021-05-28 NOTE — Telephone Encounter (Signed)
Scheduled per sch msg. Called and left msg. Mailed printout  

## 2021-06-05 ENCOUNTER — Telehealth: Payer: Self-pay | Admitting: *Deleted

## 2021-06-05 NOTE — Telephone Encounter (Signed)
This RN attempted to return call to Lorn Junes ( pt's wife ) per her message stating she has not heard from anyone " waiting for a call about Kayceon's low HCT"  No return call number left per message.  Noted scheduling message that they called - and left message - and mailed appt. On 05/28/2021.  This RN tried mobile number with no answer post 15+ rings- called placed to home number- obtained VM- message left per above.

## 2021-06-11 DIAGNOSIS — Z6823 Body mass index (BMI) 23.0-23.9, adult: Secondary | ICD-10-CM | POA: Diagnosis not present

## 2021-06-11 DIAGNOSIS — R1084 Generalized abdominal pain: Secondary | ICD-10-CM | POA: Diagnosis not present

## 2021-06-11 DIAGNOSIS — K529 Noninfective gastroenteritis and colitis, unspecified: Secondary | ICD-10-CM | POA: Diagnosis not present

## 2021-06-11 DIAGNOSIS — R6889 Other general symptoms and signs: Secondary | ICD-10-CM | POA: Diagnosis not present

## 2021-06-17 DIAGNOSIS — G894 Chronic pain syndrome: Secondary | ICD-10-CM | POA: Diagnosis not present

## 2021-06-17 DIAGNOSIS — Z79899 Other long term (current) drug therapy: Secondary | ICD-10-CM | POA: Diagnosis not present

## 2021-06-17 DIAGNOSIS — F17200 Nicotine dependence, unspecified, uncomplicated: Secondary | ICD-10-CM | POA: Diagnosis not present

## 2021-06-17 DIAGNOSIS — F1120 Opioid dependence, uncomplicated: Secondary | ICD-10-CM | POA: Diagnosis not present

## 2021-06-17 DIAGNOSIS — G8929 Other chronic pain: Secondary | ICD-10-CM | POA: Diagnosis not present

## 2021-06-17 DIAGNOSIS — M545 Low back pain, unspecified: Secondary | ICD-10-CM | POA: Diagnosis not present

## 2021-06-17 DIAGNOSIS — Z6822 Body mass index (BMI) 22.0-22.9, adult: Secondary | ICD-10-CM | POA: Diagnosis not present

## 2021-06-17 DIAGNOSIS — R030 Elevated blood-pressure reading, without diagnosis of hypertension: Secondary | ICD-10-CM | POA: Diagnosis not present

## 2021-06-18 DIAGNOSIS — R1084 Generalized abdominal pain: Secondary | ICD-10-CM | POA: Diagnosis not present

## 2021-06-18 DIAGNOSIS — Z6823 Body mass index (BMI) 23.0-23.9, adult: Secondary | ICD-10-CM | POA: Diagnosis not present

## 2021-06-18 DIAGNOSIS — R110 Nausea: Secondary | ICD-10-CM | POA: Diagnosis not present

## 2021-06-18 DIAGNOSIS — M1711 Unilateral primary osteoarthritis, right knee: Secondary | ICD-10-CM | POA: Diagnosis not present

## 2021-06-21 ENCOUNTER — Other Ambulatory Visit (HOSPITAL_COMMUNITY): Payer: Self-pay | Admitting: Gastroenterology

## 2021-06-21 DIAGNOSIS — E785 Hyperlipidemia, unspecified: Secondary | ICD-10-CM | POA: Diagnosis not present

## 2021-06-21 DIAGNOSIS — Z0000 Encounter for general adult medical examination without abnormal findings: Secondary | ICD-10-CM | POA: Diagnosis not present

## 2021-06-21 DIAGNOSIS — Z9181 History of falling: Secondary | ICD-10-CM | POA: Diagnosis not present

## 2021-06-21 DIAGNOSIS — R112 Nausea with vomiting, unspecified: Secondary | ICD-10-CM | POA: Diagnosis not present

## 2021-06-21 DIAGNOSIS — R634 Abnormal weight loss: Secondary | ICD-10-CM | POA: Diagnosis not present

## 2021-06-21 DIAGNOSIS — Z1331 Encounter for screening for depression: Secondary | ICD-10-CM | POA: Diagnosis not present

## 2021-06-21 DIAGNOSIS — R1084 Generalized abdominal pain: Secondary | ICD-10-CM | POA: Diagnosis not present

## 2021-06-25 DIAGNOSIS — Z79899 Other long term (current) drug therapy: Secondary | ICD-10-CM | POA: Diagnosis not present

## 2021-06-25 DIAGNOSIS — M150 Primary generalized (osteo)arthritis: Secondary | ICD-10-CM | POA: Diagnosis not present

## 2021-06-25 DIAGNOSIS — Z6823 Body mass index (BMI) 23.0-23.9, adult: Secondary | ICD-10-CM | POA: Diagnosis not present

## 2021-06-25 DIAGNOSIS — M0579 Rheumatoid arthritis with rheumatoid factor of multiple sites without organ or systems involvement: Secondary | ICD-10-CM | POA: Diagnosis not present

## 2021-06-25 DIAGNOSIS — M1009 Idiopathic gout, multiple sites: Secondary | ICD-10-CM | POA: Diagnosis not present

## 2021-06-27 ENCOUNTER — Ambulatory Visit (HOSPITAL_BASED_OUTPATIENT_CLINIC_OR_DEPARTMENT_OTHER)
Admission: RE | Admit: 2021-06-27 | Discharge: 2021-06-27 | Disposition: A | Discharge status: Home / Self Care | Payer: Medicare HMO | Source: Ambulatory Visit | Attending: Gastroenterology | Admitting: Gastroenterology

## 2021-06-27 ENCOUNTER — Encounter (HOSPITAL_BASED_OUTPATIENT_CLINIC_OR_DEPARTMENT_OTHER): Payer: Self-pay

## 2021-06-27 ENCOUNTER — Other Ambulatory Visit: Payer: Self-pay

## 2021-06-27 DIAGNOSIS — R112 Nausea with vomiting, unspecified: Secondary | ICD-10-CM

## 2021-06-27 DIAGNOSIS — R1084 Generalized abdominal pain: Secondary | ICD-10-CM | POA: Diagnosis not present

## 2021-06-27 DIAGNOSIS — R1110 Vomiting, unspecified: Secondary | ICD-10-CM | POA: Diagnosis not present

## 2021-06-27 LAB — POCT I-STAT CREATININE: Creatinine, Ser: 0.8 mg/dL (ref 0.61–1.24)

## 2021-06-27 IMAGING — CT CT ABD-PELV W/ CM
2 of 5 series · 16 of 46 positions shown, 18 images · IV contrast (APPLIED)
Comparison: Noncontrast CT on [DATE]

CLINICAL DATA: Nausea and vomiting for 2 weeks. 14 lb weight loss
in past 2 weeks. Subtotal colectomy and ileostomy for remote history
of diverticulitis.

EXAM:
CT ABDOMEN AND PELVIS WITH CONTRAST
TECHNIQUE: Multidetector CT imaging of the abdomen and pelvis was performed
using the standard protocol following bolus administration of
intravenous contrast.
CONTRAST:  75mL OMNIPAQUE IOHEXOL 300 MG/ML  SOLN

[Series 2: abd pel w · axial · 0.69mm/px · z∈[+597,+1027]mm · 13 of 96 slices shown, 15 images]
[im 5/96  soft-tissue]
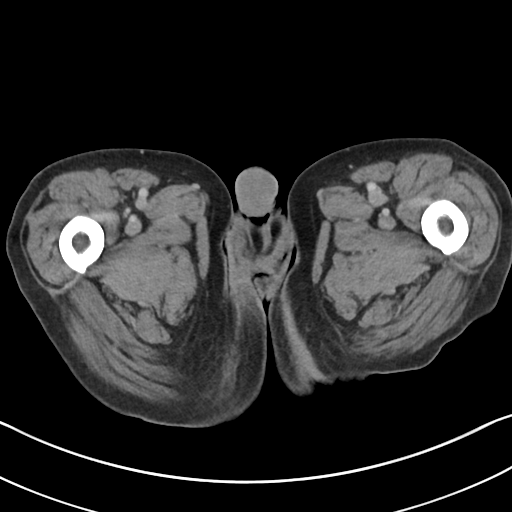
[im 5/96  bone]
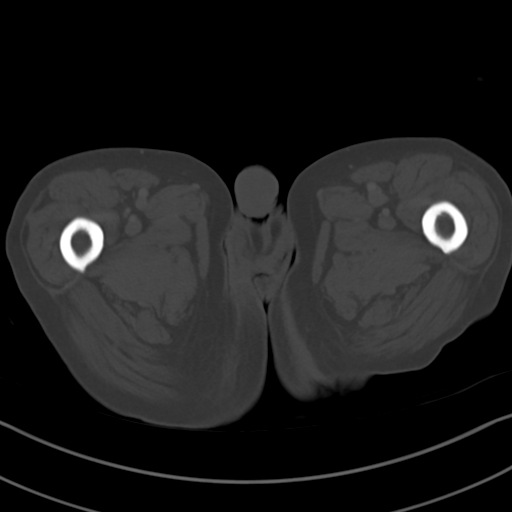
[im 14/96  soft-tissue]
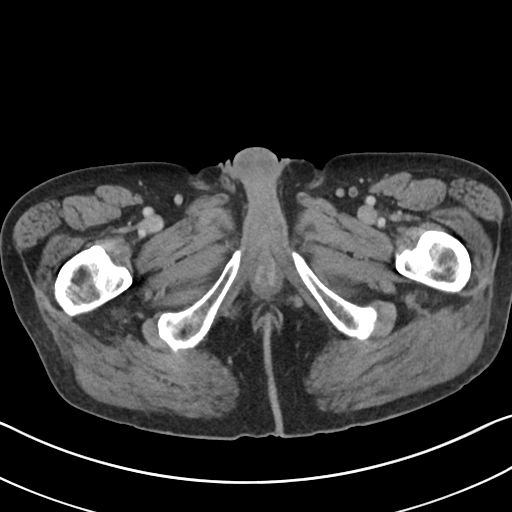
[im 19/96  soft-tissue]
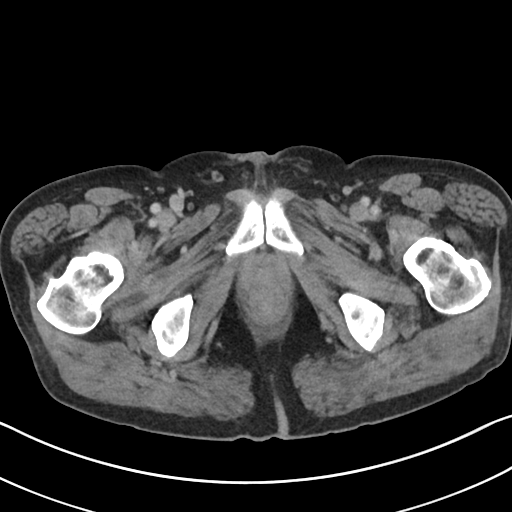
[im 28/96  soft-tissue]
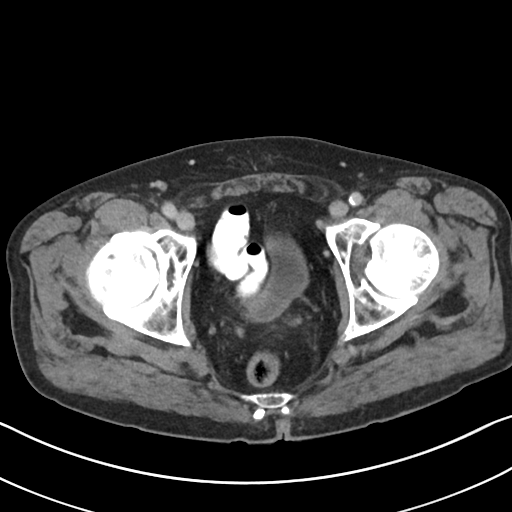
[im 32/96  soft-tissue]
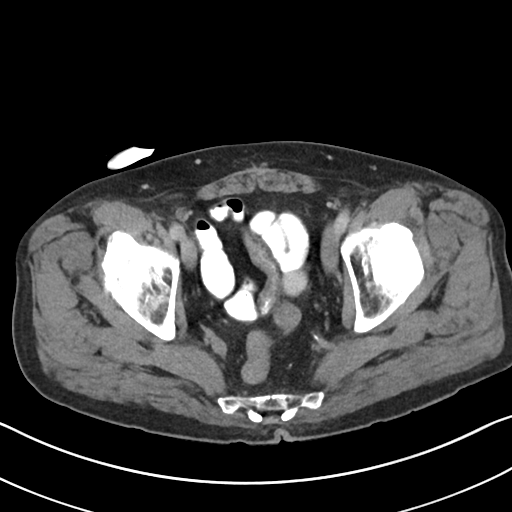
[im 41/96  soft-tissue]
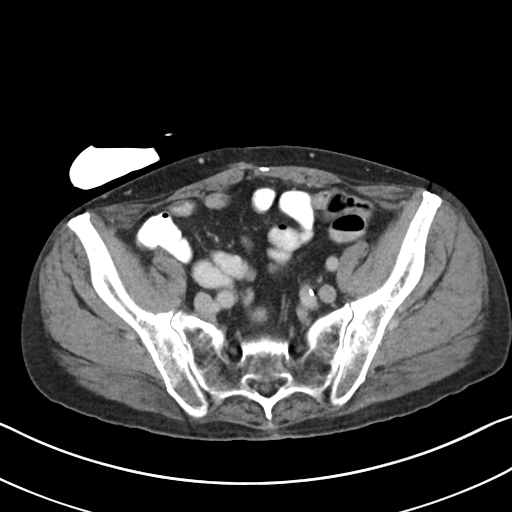
[im 50/96  soft-tissue]
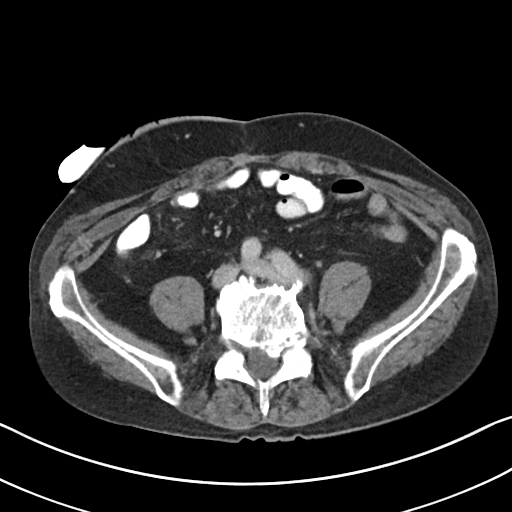
[im 55/96  soft-tissue]
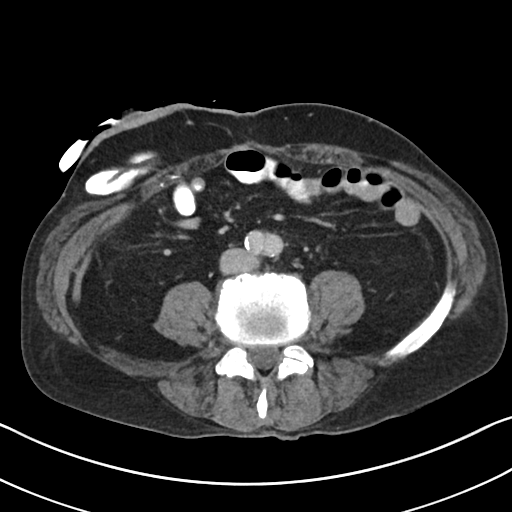
[im 64/96  soft-tissue]
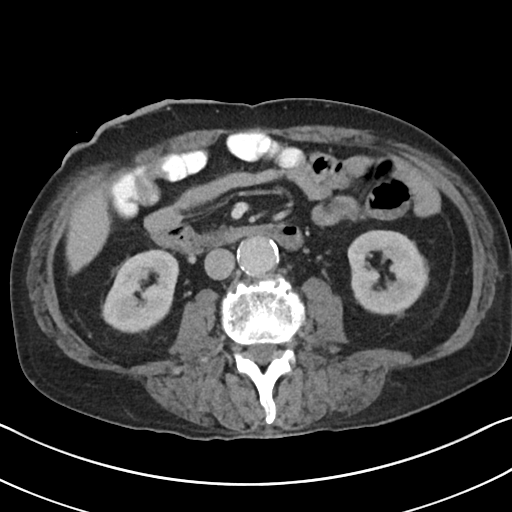
[im 64/96  bone]
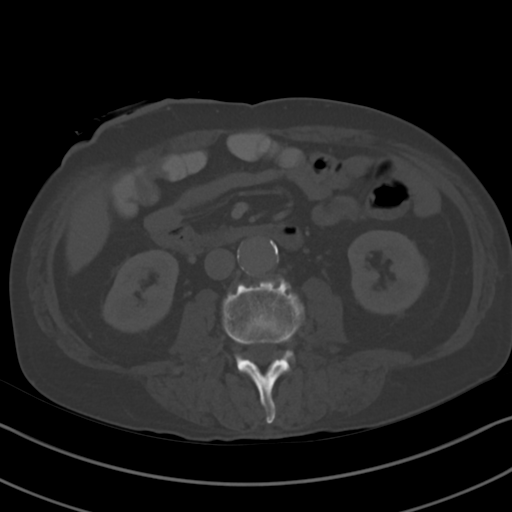
[im 68/96  soft-tissue]
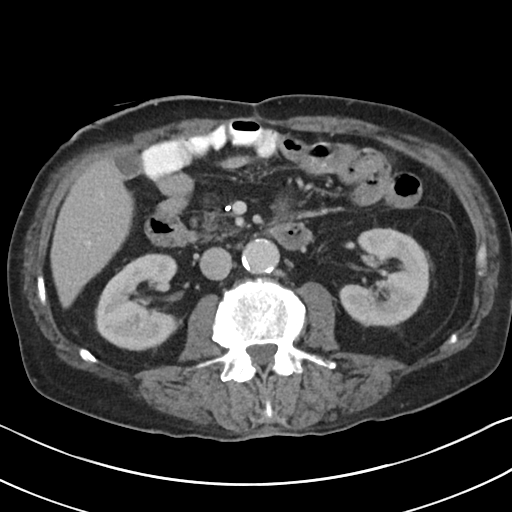
[im 77/96  soft-tissue]
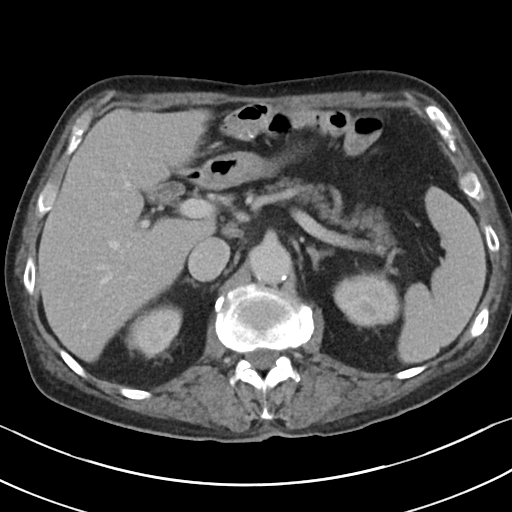
[im 82/96  soft-tissue]
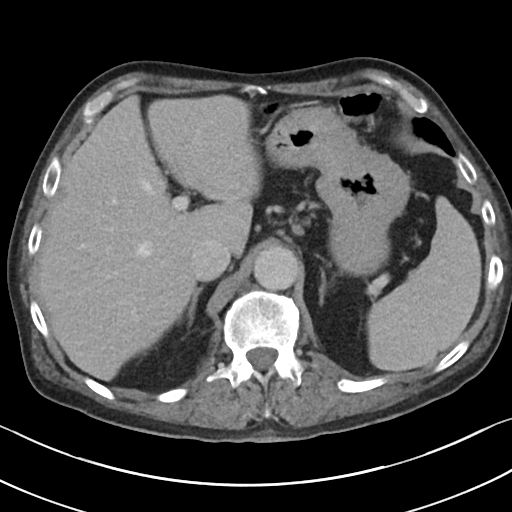
[im 91/96  soft-tissue]
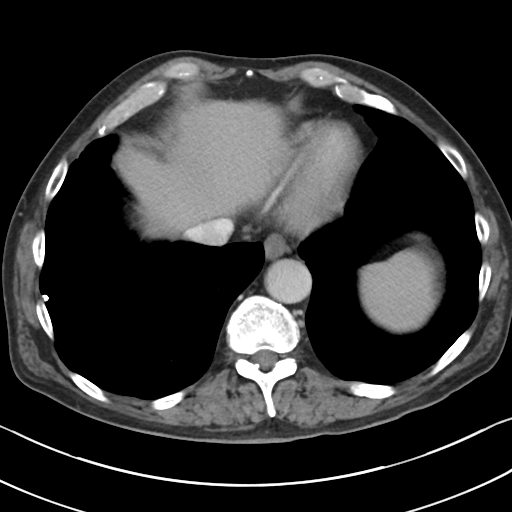

[Series 5: coronal · coronal · 0.72mm/px · 3 of 93 slices shown]
[im 31/93  soft-tissue]
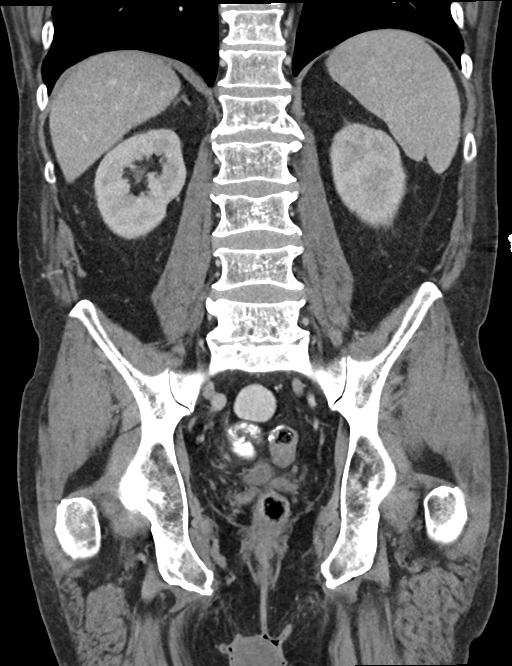
[im 41/93  soft-tissue]
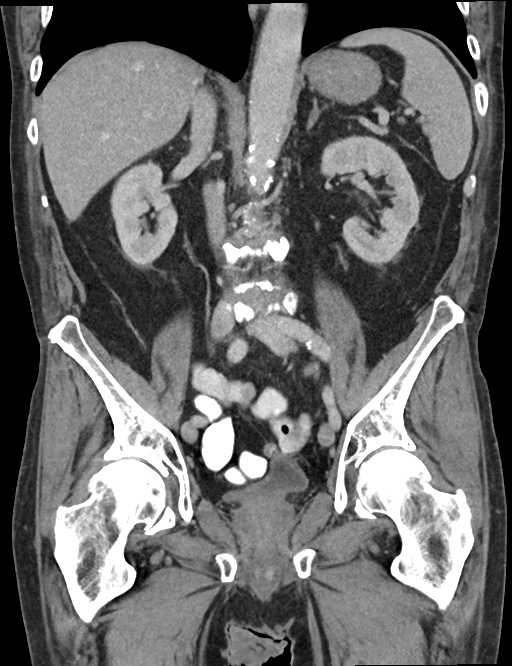
[im 52/93  soft-tissue]
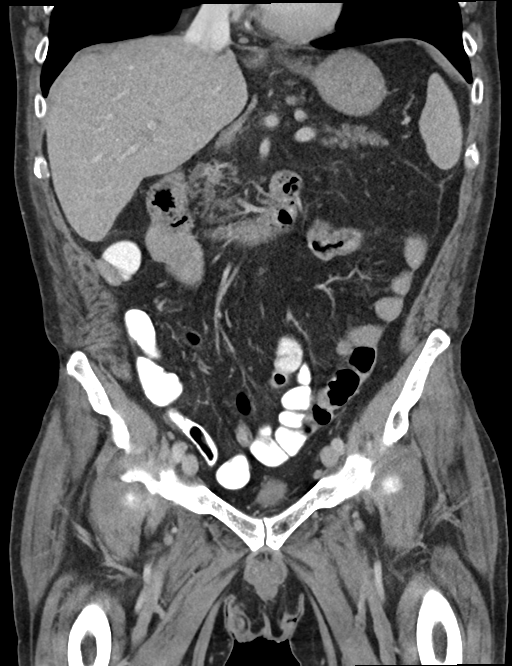

[16 of 46 positions shown; findings below may reference images not displayed]

FINDINGS: Lower Chest: No acute findings.

Hepatobiliary: No hepatic masses identified. Several tiny less than
1 cm calcified gallstones are again seen, however there is no
evidence of cholecystitis or biliary ductal dilatation.

Pancreas:  No mass or inflammatory changes.

Spleen: Within normal limits in size and appearance.

Adrenals/Urinary Tract: No masses identified. No evidence of
ureteral calculi or hydronephrosis.

Stomach/Bowel: Postop changes again seen from previous subtotal
colectomy with right lower quadrant ileostomy and Hartman's pouch.
No evidence of bowel obstruction, inflammatory process, or abnormal
fluid collections.

Vascular/Lymphatic: No pathologically enlarged lymph nodes. No acute
vascular findings. Aortic atherosclerotic calcification noted.
Ectasia of thoracic aorta is seen measuring 2.8 cm in maximum
diameter.

Reproductive:  No mass or other significant abnormality.

Other:  None.

Musculoskeletal: No suspicious bone lesions identified. Bilateral
hip osteoarthritis again seen, right side greater than left.
IMPRESSION: No acute findings within the abdomen or pelvis.

Cholelithiasis. No radiographic evidence of cholecystitis.

Aortic Atherosclerosis ([MK]-[MK]).

## 2021-06-27 MED ORDER — IOHEXOL 300 MG/ML  SOLN
75.0000 mL | Freq: Once | INTRAMUSCULAR | Status: AC | PRN
  Administered 2021-06-27: 75 mL via INTRAVENOUS

## 2021-07-08 ENCOUNTER — Other Ambulatory Visit (HOSPITAL_COMMUNITY): Payer: Self-pay | Admitting: Gastroenterology

## 2021-07-08 ENCOUNTER — Other Ambulatory Visit: Payer: Self-pay

## 2021-07-08 ENCOUNTER — Inpatient Hospital Stay: Payer: Medicare HMO | Attending: Oncology

## 2021-07-08 DIAGNOSIS — F32A Depression, unspecified: Secondary | ICD-10-CM | POA: Insufficient documentation

## 2021-07-08 DIAGNOSIS — E781 Pure hyperglyceridemia: Secondary | ICD-10-CM | POA: Insufficient documentation

## 2021-07-08 DIAGNOSIS — R112 Nausea with vomiting, unspecified: Secondary | ICD-10-CM

## 2021-07-08 DIAGNOSIS — M069 Rheumatoid arthritis, unspecified: Secondary | ICD-10-CM | POA: Insufficient documentation

## 2021-07-08 DIAGNOSIS — Z808 Family history of malignant neoplasm of other organs or systems: Secondary | ICD-10-CM | POA: Insufficient documentation

## 2021-07-08 DIAGNOSIS — F1020 Alcohol dependence, uncomplicated: Secondary | ICD-10-CM | POA: Diagnosis not present

## 2021-07-08 DIAGNOSIS — R1084 Generalized abdominal pain: Secondary | ICD-10-CM

## 2021-07-08 DIAGNOSIS — D649 Anemia, unspecified: Secondary | ICD-10-CM | POA: Insufficient documentation

## 2021-07-08 DIAGNOSIS — F17210 Nicotine dependence, cigarettes, uncomplicated: Secondary | ICD-10-CM | POA: Diagnosis not present

## 2021-07-08 DIAGNOSIS — I10 Essential (primary) hypertension: Secondary | ICD-10-CM | POA: Diagnosis not present

## 2021-07-08 DIAGNOSIS — D61818 Other pancytopenia: Secondary | ICD-10-CM | POA: Diagnosis not present

## 2021-07-08 DIAGNOSIS — E538 Deficiency of other specified B group vitamins: Secondary | ICD-10-CM | POA: Insufficient documentation

## 2021-07-08 DIAGNOSIS — E785 Hyperlipidemia, unspecified: Secondary | ICD-10-CM | POA: Insufficient documentation

## 2021-07-08 LAB — RETICULOCYTES
Immature Retic Fract: 15.8 % (ref 2.3–15.9)
RBC.: 2.8 MIL/uL — ABNORMAL LOW (ref 4.22–5.81)
Retic Count, Absolute: 82.9 10*3/uL (ref 19.0–186.0)
Retic Ct Pct: 3 % (ref 0.4–3.1)

## 2021-07-08 LAB — CBC WITH DIFFERENTIAL/PLATELET
Abs Immature Granulocytes: 0.02 10*3/uL (ref 0.00–0.07)
Basophils Absolute: 0 10*3/uL (ref 0.0–0.1)
Basophils Relative: 1 %
Eosinophils Absolute: 0.4 10*3/uL (ref 0.0–0.5)
Eosinophils Relative: 8 %
HCT: 29.7 % — ABNORMAL LOW (ref 39.0–52.0)
Hemoglobin: 10.6 g/dL — ABNORMAL LOW (ref 13.0–17.0)
Immature Granulocytes: 0 %
Lymphocytes Relative: 36 %
Lymphs Abs: 1.7 10*3/uL (ref 0.7–4.0)
MCH: 37.9 pg — ABNORMAL HIGH (ref 26.0–34.0)
MCHC: 35.7 g/dL (ref 30.0–36.0)
MCV: 106.1 fL — ABNORMAL HIGH (ref 80.0–100.0)
Monocytes Absolute: 0.3 10*3/uL (ref 0.1–1.0)
Monocytes Relative: 5 %
Neutro Abs: 2.4 10*3/uL (ref 1.7–7.7)
Neutrophils Relative %: 50 %
Platelets: 100 10*3/uL — ABNORMAL LOW (ref 150–400)
RBC: 2.8 MIL/uL — ABNORMAL LOW (ref 4.22–5.81)
RDW: 13.3 % (ref 11.5–15.5)
WBC: 4.8 10*3/uL (ref 4.0–10.5)
nRBC: 0 % (ref 0.0–0.2)

## 2021-07-08 LAB — FOLATE: Folate: 5.9 ng/mL — ABNORMAL LOW (ref >5.9)

## 2021-07-08 LAB — VALPROIC ACID LEVEL: Valproic Acid Lvl: 43 ug/mL — ABNORMAL LOW (ref 50.0–100.0)

## 2021-07-08 LAB — IRON AND TIBC
Iron: 147 ug/dL (ref 42–163)
Saturation Ratios: 47 % (ref 20–55)
TIBC: 310 ug/dL (ref 202–409)
UIBC: 163 ug/dL (ref 117–376)

## 2021-07-08 LAB — FERRITIN: Ferritin: 193 ng/mL (ref 24–336)

## 2021-07-08 LAB — SAVE SMEAR(SSMR), FOR PROVIDER SLIDE REVIEW

## 2021-07-08 LAB — VITAMIN B12: Vitamin B-12: 413 pg/mL (ref 180–914)

## 2021-07-09 ENCOUNTER — Ambulatory Visit: Payer: Medicare HMO | Admitting: Physician Assistant

## 2021-07-09 ENCOUNTER — Encounter: Payer: Self-pay | Admitting: Physician Assistant

## 2021-07-09 ENCOUNTER — Telehealth: Payer: Self-pay | Admitting: Physician Assistant

## 2021-07-09 DIAGNOSIS — F4323 Adjustment disorder with mixed anxiety and depressed mood: Secondary | ICD-10-CM | POA: Diagnosis not present

## 2021-07-09 DIAGNOSIS — G4700 Insomnia, unspecified: Secondary | ICD-10-CM | POA: Diagnosis not present

## 2021-07-09 DIAGNOSIS — Z789 Other specified health status: Secondary | ICD-10-CM | POA: Diagnosis not present

## 2021-07-09 DIAGNOSIS — Z630 Problems in relationship with spouse or partner: Secondary | ICD-10-CM | POA: Diagnosis not present

## 2021-07-09 DIAGNOSIS — D696 Thrombocytopenia, unspecified: Secondary | ICD-10-CM | POA: Diagnosis not present

## 2021-07-09 DIAGNOSIS — F39 Unspecified mood [affective] disorder: Secondary | ICD-10-CM

## 2021-07-09 DIAGNOSIS — F17200 Nicotine dependence, unspecified, uncomplicated: Secondary | ICD-10-CM

## 2021-07-09 DIAGNOSIS — Z79899 Other long term (current) drug therapy: Secondary | ICD-10-CM

## 2021-07-09 DIAGNOSIS — R454 Irritability and anger: Secondary | ICD-10-CM | POA: Diagnosis not present

## 2021-07-09 MED ORDER — ALPRAZOLAM 0.5 MG PO TABS: 0.2500 mg | ORAL_TABLET | Freq: Two times a day (BID) | ORAL | 0 refills | Status: DC | PRN

## 2021-07-09 MED ORDER — DIVALPROEX SODIUM ER 500 MG PO TB24: 1000.0000 mg | ORAL_TABLET | Freq: Every day | ORAL | 0 refills | Status: DC

## 2021-07-09 NOTE — Telephone Encounter (Signed)
Pharmacy faxed paperwork today. The issue is that patient is on Norco and the alprazolam needs an override code.  Pharmacy said they are unable to put in the code and insurance will not cover med without it.

## 2021-07-09 NOTE — Patient Instructions (Signed)
Increase Depakote ER 500 mg to 3 pills every night. I wrote out an order for labs to be drawn in a couple of weeks.  Please ask your PCP to have them drawn, if they are not going to draw the specific labs please call and I will order them through [facility] or [facility].  It is really important that we check your platelet count again and your Depakote level.

## 2021-07-09 NOTE — Progress Notes (Signed)
[facility]  Patient ID: Eddie Taylor,  MRN: [id number]  PCP: [name], MD  Date of Evaluation: 07/09/2021 Time spent:40 minutes  Chief Complaint:  Chief Complaint   Follow-up      HISTORY/CURRENT STATUS: HPI For routine med check.  Has been sick for 2-3 weeks, feels weak, vomits after he eats almost every time. He can keep grilled cheese sandwiches and biscuit and gravy down sometimes.. Has lost 14 # in the past 2 weeks. Has seen PCP, had neg CT abd and pelvis. Has GE scan set up for 12/29.   He's much more irritable and gets aggravated easily. Then his hands start shaking when he gets upset like this.  He never had tremor before this.  Took one of his wife's Xanax and it really helped when he got so anxious.  Has decreased energy and motivation b/c weakness, he is able to wash clothes and do cooking and cleaning, things that 'have to be done and nobody else will do them.'  No increased talkativeness, no racing thoughts, no impulsivity or risky behaviors, no increased spending, no grandiosity, no paranoia, and no hallucinations.  Feels more down since all this has happened. Wants to be by himself a lot, his wife annoys him trying to get him to eat, he just has to walk away. Does cry sometimes, like at a [employer] commercial but no worse than normal. Sleeps around 5 hours straight, gets up to go to the bathroom, usually goes back to sleep for another hour or so. Sleep pattern is not different. More anxious and has less patience. No SI/HI.   Still smoking only a few cigarettes per day.  Some days he does not smoke any.  He is still not drinking much at all.  Maybe a 6 pack/week.  Review of Systems  Constitutional:  Positive for malaise/fatigue.  HENT: Negative.    Eyes: Negative.   Respiratory: Negative.    Cardiovascular: Negative.   Gastrointestinal:  Positive for vomiting.  Genitourinary: Negative.   Musculoskeletal: Negative.   Skin: Negative.   Neurological:  Negative.   Endo/Heme/Allergies: Negative.        No bleeding or easy bruising.  Psychiatric/Behavioral:         See HPI.    Individual Medical History/ Review of Systems: Changes? :Yes   see HPI  Past medications for mental health diagnoses include: Wellbutrin, Elavil, Effexor, Ambien, Gabapentin, Xanax, Sonata, Lunesta  Allergies: Bupropion, Morphine and related, Prozac [fluoxetine hcl], and Sulfa antibiotics  Current Medications:  Current Outpatient Medications:    Adalimumab (HUMIRA) 40 MG/0.4ML PSKT, Inject into the skin., Disp: , Rfl:    Adalimumab 40 MG/0.8ML PSKT, Inject 40 mg into the skin once a week., Disp: , Rfl:    allopurinol (ZYLOPRIM) 300 MG tablet, Take 300 mg by mouth every evening., Disp: , Rfl:    ALPRAZolam (XANAX) 0.5 MG tablet, Take 0.5-1 tablets (0.25-0.5 mg total) by mouth 2 (two) times daily as needed for anxiety., Disp: 30 tablet, Rfl: 0   esomeprazole (NEXIUM) 40 MG capsule, Take 40 mg by mouth 2 (two) times daily before a meal., Disp: , Rfl:    HYDROcodone-acetaminophen (NORCO) 10-325 MG tablet, Take 1 tablet by mouth every 6 (six) hours as needed for moderate pain or severe pain., Disp: , Rfl:    lisinopril (ZESTRIL) 10 MG tablet, Take 10 mg by mouth daily., Disp: , Rfl:    pravastatin (PRAVACHOL) 40 MG tablet, Take 40 mg by mouth every evening., Disp: ,  Rfl:    traZODone (DESYREL) 100 MG tablet, Take 1-2 tablets (100-200 mg total) by mouth at bedtime as needed. for sleep, Disp: 180 tablet, Rfl: 3   VASCEPA 1 g CAPS, Take 2 g by mouth 2 (two) times daily., Disp: , Rfl:    VITAMIN D PO, Take 50,000 Units by mouth once a week., Disp: , Rfl:    divalproex (DEPAKOTE ER) 500 MG 24 hr tablet, Take 3 tablets (1,500 mg total) by mouth at bedtime., Disp: 270 tablet, Rfl: 0   pantoprazole (PROTONIX) 40 MG tablet, Take 1 tablet (40 mg total) by mouth 2 (two) times daily. (Patient not taking: Reported on 07/09/2021), Disp: , Rfl:    sucralfate (CARAFATE) 1 GM/10ML  suspension, Take 10 mLs (1 g total) by mouth 4 (four) times daily -  with meals and at bedtime. (Patient not taking: Reported on 10/09/2020), Disp: 420 mL, Rfl: 0   tamsulosin (FLOMAX) 0.4 MG CAPS capsule, Take 1 capsule (0.4 mg total) by mouth daily. (Patient not taking: Reported on 10/09/2020), Disp: 30 capsule, Rfl: 1 Medication Side Effects: none  Family Medical/ Social History: Changes? No   MENTAL HEALTH EXAM:  There were no vitals taken for this visit.There is no height or weight on file to calculate BMI.  General Appearance: Casual and Well Groomed  Eye Contact:  Good  Speech:  Clear and Coherent and Normal Rate  Volume:  Normal  Mood:  Anxious and Depressed  Affect:  Depressed and Anxious  Thought Process:  Goal Directed and Descriptions of Associations: Circumstantial  Orientation:  Full (Time, Place, and Person)  Thought Content: Logical   Suicidal Thoughts:  No  Homicidal Thoughts:  No  Memory:  WNL  Judgement:  Good  Insight:  Good  Psychomotor Activity:  Normal  Concentration:  Concentration: Good and Attention Span: Fair  Recall:  Good  Fund of Knowledge: Good  Language: Good  Assets:  Desire for Improvement  ADL's:  Intact  Cognition: WNL  Prognosis:  Good   07/08/2021 Depakote level 43 CBC WBC 4.8, hemoglobin 10.6, hematocrit 29.7, platelets 100  06/21/2021 labs at [place] CBC White count 6.4, hemoglobin 10.5, hematocrit 30.4, platelet count 129 Glucose 97, LFTs normal  CT scan 06/27/2021 was negative for acute findings.   DIAGNOSES:    ICD-10-CM   1. Episodic mood disorder (HCC)  F39 CBC with Differential/Platelet    Valproic acid level    2. Encounter for long-term (current) use of medications  Z79.899 CBC with Differential/Platelet    Valproic acid level    3. Smoker  F17.200     4. Thrombocytopenia ([place])  D69.6     5. Situational mixed anxiety and depressive disorder  F43.23     6. Alcohol use  Z78.9     7. Irritability and anger   R45.4     8. Insomnia, unspecified type  G47.00     9. Marital stress  Z63.0         Receiving Psychotherapy: Yes With [name]   RECOMMENDATIONS:  PDMP was reviewed.  Last Lunesta filled 09/04/2020.  He is on hydrocodone and buprenorphine, known to me. I provided 40 minutes of face to face time during this encounter, including time spent before and after the visit in records review, medical decision making, counseling pertinent to today's visit, and charting.  Until these past few weeks he had been doing well mentally.  Since he does have more irritability and anger, I recommend increasing the Depakote.  However I have asked him to take it in the evening not mornings just because I can have a more accurate results when his lab work is done, have them drawn approximately 12 hours after he takes the Depakote.  He verbalizes understanding. I recommend Xanax.  But this is going to be short-term only.  He knows not to drink alcohol when he is taking this.  He does still drink maybe 1 beer per day, if that, but has cut way back over the past 6 months to a year.  He understands this is for short-term use and if he needs something else for anxiety long term then I will recommend something to prevent the anxiety, not losing a benzo for rescue. Consider adding an antidepressant, but for now, will treat the anxiety and increased irritability and I believe that, along with figuring out and treating his GI issues, will help the depression.  Discussed his platelet count.  It is below normal range, this could be from the Depakote.  He will need labs drawn within 1 to 2 weeks after increasing the Depakote.  He knows to report any bleeding, such as nosebleeds, bleeding gums, bleeding seen and the ileostomy, or easy bruising. Briefly discussed smoking and alcohol cessation. Start Xanax 0.5 mg, 1/2-1 p.o. twice daily as needed. Increase Depakote ER to 3 p.o. nightly. Continue trazodone 100 mg, 1-2 nightly  as needed sleep. Labs ordered as above.  Order was also written on a prescription, asking his PCP to have them drawn if he will be getting labs in approximately 10 days.  He understands. Continue therapy with [name]. Return in 4 weeks.  Donnal Moat, PA-C

## 2021-07-09 NOTE — Telephone Encounter (Signed)
Pt's wife on [place].  She LVM stating that Jerimyah needs a prior auth for the medicine that Helene Kelp prescribed for Ronte today.   Next appt. 1/17

## 2021-07-10 MED ORDER — DIVALPROEX SODIUM ER 500 MG PO TB24: 1500.0000 mg | ORAL_TABLET | Freq: Every day | ORAL | 0 refills | Status: DC

## 2021-07-10 NOTE — Progress Notes (Signed)
[facility]  Telephone:([phone] Fax:([phone]) [phone]     ID: Eddie Taylor DOB: Dec 21, 1959  MR#: [id number]  MWU#:[id number]  Patient Care Team: [name], MD as PCP - General (Internal Medicine) Alen Blew as Physician Assistant (Psychiatry) Ricardo Schubach, Virgie Dad, MD as Consulting Physician (Oncology) Wilford Corner, MD as Consulting Physician (Gastroenterology) Alen Blew as Physician Assistant (Psychiatry) Flossie Buffy., MD as Referring Physician (Cardiology) Gavin Pound, MD as Consulting Physician (Rheumatology) Chauncey Cruel, MD OTHER MD:  CHIEF COMPLAINT: Pancytopenia  CURRENT TREATMENT: Observation   INTERVAL HISTORY: Eddie Taylor returns today for follow up of pancytopenia. He is accompanied by his wife.  He was evaluated in the hematology clinic on 09/12/2020.  At that time we discussed the fact that he is on medications known to decrease the white cell count namely the Humira and the valproate.  There is also is the fact that alcohol is a bone marrow depressant.  He returns here for follow-up and since the last visit her has successfully controlled alcohol intake to a maximum of two beers a day. He continues on the other medications.  He has lost 35 lbs according to his wife. Per our records he has lost 8 lbs in the last 10 months. He has difficulty eating, with food "coming back up" about an hour after ingestion. Heis being evaluated by Dr Michail Sermon and EGD Feb 2022 was c/w eosinophilic esophagitis.   On 06/27/2021 he had a repeat CT of the abdomen and pelvis   Recall he has a subtotal colectomy and ileostomy for history of diverticular disease.  There were no liver masses noted.  There were no acute findings.  Labs obtained 07/08/2021 showed an improved reticulocyte count as at 82.9 (was 25.28 August 2020), B12 was normal at 413, folate was borderline low at 5.9.  Iron saturation was 47 which is normal with a ferritin of 193, which  is adequate.  White cell count is normal at 4.8 with an unremarkable differential.  Hemoglobin is 10.6 with an MCV of 106.1 which is up from 103.5 and the platelet count is stable at 100.   REVIEW OF SYSTEMS: Lebert feels exhausted. He has knee problems limiting his ability to exercise. He has an essential tremor. Otherwise a detailed ROS was as noted above.   COVID 19 VACCINATION STATUS: Status post Pfizer x2 with booster July 05, 2020   HISTORY OF CURRENT ILLNESS: From the original intake note:  Eddie Taylor was admitted to [facility]18/2020 with complaints of weakness and vomiting as well as progressive generalized weakness.  He was found to be hypotensive and with a metabolic acidosis, with acute kidney injury.  These issues were slowly resolved and by the time of discharge on 08/28/2020 the patient was essentially "back to baseline".  On 08/28/2020 he underwent EGD which suggested eosinophilic esophagitis.  Biopsies (MCS-2 2-797) confirmed this diagnosis.  He was noted to be pancytopenic during the admission.  On 08/27/2020 his total white cell count was 2.8, his hemoglobin was 9.3 and his platelet count 205,000.  The patient was referred here for further evaluation of pancytopenia.  The patient's subsequent history is as detailed below.   PAST MEDICAL HISTORY: Past Medical History:  Diagnosis Date   Anxiety    Arthritis    Depression    Diverticulitis    Gastric ulcer    Hyperlipidemia    Hypertension    Syncope and collapse 02/14/2019    PAST  SURGICAL HISTORY: Past Surgical History:  Procedure Laterality Date   BIOPSY  08/28/2020   Procedure: BIOPSY;  Surgeon: Otis Brace, MD;  Location: [facility];  Service: Gastroenterology;;   COLON SURGERY     COLOSTOMY     ESOPHAGOGASTRODUODENOSCOPY N/A 03/13/2013   Procedure: ESOPHAGOGASTRODUODENOSCOPY (EGD);  Surgeon: Lear Ng, MD;  Location[facility] END[facility]COPY;  Service: Endoscopy;   Laterality: N/A;   ESOPHAGOGASTRODUODENOSCOPY N/A 08/28/2020   Procedure: ESOPHAGOGASTRODUODENOSCOPY (EGD);  Surgeon: Otis Brace, MD;  Location: [facility] ENDOSCOPY;  Service: Gastroenterology;  Laterality: N/A;   rheumatoid arthritis     SURGERY SCROTAL / TESTICULAR  2014    FAMILY HISTORY: Family History  Problem Relation Age of Onset   Brain cancer Mother    Hypertension Mother    Alcohol abuse Mother    Depression Mother    Depression Sister    Heart disease Sister    Alcohol abuse Sister    Hypertension Sister    Drug abuse Brother    Cirrhosis Maternal Grandfather    Hypertension Maternal Grandfather    Hyperlipidemia Maternal Grandfather    Heart disease Maternal Grandmother    Diverticulitis Half-Brother    Depression Half-Brother    Depression Half-Sister   The patient has very little information regarding his father.  He tells me his mother died from brain cancer.  He has 1 full brother and 2 full sisters, then 2 half brothers and one half sister.  None of them have cancer to his knowledge.  There are no bleeding or clotting problems in his family that he knows of   SOCIAL HISTORY: (updated 08/2020)  Eddie Taylor and his wife of 12 years, Jana Half, used to own a concrete business.  They have no children by they have 3 dogs and 2 cats.  There are lapsed Catholics.   ADVANCED DIRECTIVES: In the absence of any document to the contrary the patient's spouse is his healthcare power of attorney   HEALTH MAINTENANCE: Social History   Tobacco Use   Smoking status: Every Day    Packs/day: 0.30    Types: Cigarettes   Smokeless tobacco: Never  Vaping Use   Vaping Use: Never used  Substance Use Topics   Alcohol use: Yes    Alcohol/week: 6.0 standard drinks    Types: 6 Cans of beer per week   Drug use: Not Currently    Types: Cocaine     Colonoscopy: Per [name]  PSA:   Bone density:    Allergies  Allergen Reactions   Bupropion Hives   Morphine And Related     "i go  beside myself, i dont know. Its not pretty."    Prozac [Fluoxetine Hcl] Itching   Sulfa Antibiotics Other (See Comments)    Doesn't feel well when taking     Current Outpatient Medications  Medication Sig Dispense Refill   Adalimumab (HUMIRA) 40 MG/0.4ML PSKT Inject into the skin.     Adalimumab 40 MG/0.8ML PSKT Inject 40 mg into the skin once a week.     allopurinol (ZYLOPRIM) 300 MG tablet Take 300 mg by mouth every evening.     ALPRAZolam (XANAX) 0.5 MG tablet Take 0.5-1 tablets (0.25-0.5 mg total) by mouth 2 (two) times daily as needed for anxiety. 30 tablet 0   divalproex (DEPAKOTE ER) 500 MG 24 hr tablet Take 3 tablets (1,500 mg total) by mouth at bedtime. 270 tablet 0   esomeprazole (NEXIUM) 40 MG capsule Take 40 mg by mouth 2 (two) times daily  before a meal.     HYDROcodone-acetaminophen (NORCO) 10-325 MG tablet Take 1 tablet by mouth every 6 (six) hours as needed for moderate pain or severe pain.     lisinopril (ZESTRIL) 10 MG tablet Take 10 mg by mouth daily.     pantoprazole (PROTONIX) 40 MG tablet Take 1 tablet (40 mg total) by mouth 2 (two) times daily. (Patient not taking: Reported on 07/09/2021)     pravastatin (PRAVACHOL) 40 MG tablet Take 40 mg by mouth every evening.     sucralfate (CARAFATE) 1 GM/10ML suspension Take 10 mLs (1 g total) by mouth 4 (four) times daily -  with meals and at bedtime. (Patient not taking: Reported on 10/09/2020) 420 mL 0   tamsulosin (FLOMAX) 0.4 MG CAPS capsule Take 1 capsule (0.4 mg total) by mouth daily. (Patient not taking: Reported on 10/09/2020) 30 capsule 1   traZODone (DESYREL) 100 MG tablet Take 1-2 tablets (100-200 mg total) by mouth at bedtime as needed. for sleep 180 tablet 3   VASCEPA 1 g CAPS Take 2 g by mouth 2 (two) times daily.     VITAMIN D PO Take 50,000 Units by mouth once a week.     No current facility-administered medications for this visit.    OBJECTIVE: White man who appears older than stated age  65:   07/11/21  1314  BP: (!) 165/109  Pulse: (!) 102  Resp: 18  Temp: 97.7 F (36.5 C)  SpO2: 100%      Body mass index is 23.14 kg/m.   Wt Readings from Last 3 Encounters:  07/11/21 170 lb 9.6 oz (77.4 kg)  09/12/20 178 lb 1.6 oz (80.8 kg)  08/24/20 177 lb 11.2 oz (80.6 kg)     ECOG FS:1 - Symptomatic but completely ambulatory  Sclerae unicteric, EOMs intact Wearing a mask No cervical or supraclavicular adenopathy Lungs no rales or rhonchi Heart regular rate and rhythm Abd soft, nontender, positive bowel sounds MSK no focal spinal tenderness, no upper extremity lymphedema Neuro: nonfocal, well oriented, appropriate affect   LAB RESULTS:  CMP     Component Value Date/Time   NA 135 08/28/2020 0354   NA 140 04/13/2020 1006   K 3.8 08/28/2020 0354   CL 100 08/28/2020 0354   CO2 26 08/28/2020 0354   GLUCOSE 90 08/28/2020 0354   BUN 6 (L) 08/28/2020 0354   BUN 10 04/13/2020 1006   CREATININE 0.80 06/27/2021 1321   CALCIUM 8.7 (L) 08/28/2020 0354   PROT 5.8 (L) 08/28/2020 0354   PROT 6.4 04/13/2020 1006   ALBUMIN 2.8 (L) 08/28/2020 0354   ALBUMIN 4.0 04/13/2020 1006   AST 23 08/28/2020 0354   ALT 19 08/28/2020 0354   ALKPHOS 39 08/28/2020 0354   BILITOT 0.7 08/28/2020 0354   BILITOT 0.6 04/13/2020 1006   GFRNONAA >60 08/28/2020 0354   GFRAA 120 04/13/2020 1006    No results found for: TOTALPROTELP, ALBUMINELP, A1GS, A2GS, BETS, BETA2SER, GAMS, MSPIKE, SPEI  Lab Results  Component Value Date   WBC 4.8 07/08/2021   NEUTROABS 2.4 07/08/2021   HGB 10.6 (L) 07/08/2021   HCT 29.7 (L) 07/08/2021   MCV 106.1 (H) 07/08/2021   PLT 100 (L) 07/08/2021    No results found for: LABCA2  No components found for: [id number]  No results for input(s): INR in the last 168 hours.  No results found for: LABCA2  No results found for: EXH371  No results found for: IRC789  No results found for:  IFO277  No results found for: CA2729  No components found for: HGQUANT  No results  found for: CEA1 / No results found for: CEA1   No results found for: AFPTUMOR  No results found for: CHROMOGRNA  No results found for: KPAFRELGTCHN, LAMBDASER, KAPLAMBRATIO (kappa/lambda light chains)  No results found for: HGBA, HGBA2QUANT, HGBFQUANT, HGBSQUAN (Hemoglobinopathy evaluation)   Lab Results  Component Value Date   LDH 133 08/21/2020    Lab Results  Component Value Date   IRON 147 07/08/2021   TIBC 310 07/08/2021   IRONPCTSAT 47 07/08/2021   (Iron and TIBC)  Lab Results  Component Value Date   FERRITIN 193 07/08/2021    Urinalysis    Component Value Date/Time   COLORURINE YELLOW 08/18/2020 2014   APPEARANCEUR CLEAR 08/18/2020 2014   LABSPEC 1.011 08/18/2020 2014   [place] 5.0 08/18/2020 2014   [place] 08/18/2020 2014   [place] (A) 08/18/2020 2014   [place] 08/18/2020 2014   [place] NEGATIVE 08/18/2020 2014   PROTEINUR 30 (A) 08/18/2020 2014   UROBILINOGEN 0.2 03/13/2013 0157   NITRITE NEGATIVE 08/18/2020 2014   LEUKOCYTESUR NEGATIVE 08/18/2020 2014    STUDIES: CT ABDOMEN PELVIS W CONTRAST  Result Date: 06/28/2021 CLINICAL DATA:  Nausea and vomiting for 2 weeks. 14 lb weight loss in past 2 weeks. Subtotal colectomy and ileostomy for remote history of diverticulitis. EXAM: CT ABDOMEN AND PELVIS WITH CONTRAST TECHNIQUE: Multidetector CT imaging of the abdomen and pelvis was performed using the standard protocol following bolus administration of intravenous contrast. CONTRAST:  9mL OMNIPAQUE IOHEXOL 300 MG/ML  SOLN COMPARISON:  Noncontrast CT on 08/19/2020 FINDINGS: Lower Chest: No acute findings. Hepatobiliary: No hepatic masses identified. Several tiny less than 1 cm calcified gallstones are again seen, however there is no evidence of cholecystitis or biliary ductal dilatation. Pancreas:  No mass or inflammatory changes. Spleen: Within normal limits in size and appearance. Adrenals/Urinary Tract: No masses identified. No  evidence of ureteral calculi or hydronephrosis. Stomach/Bowel: Postop changes again seen from previous subtotal colectomy with right lower quadrant ileostomy and Hartman's pouch. No evidence of bowel obstruction, inflammatory process, or abnormal fluid collections. Vascular/Lymphatic: No pathologically enlarged lymph nodes. No acute vascular findings. Aortic atherosclerotic calcification noted. Ectasia of thoracic aorta is seen measuring 2.8 cm in maximum diameter. Reproductive:  No mass or other significant abnormality. Other:  None. Musculoskeletal: No suspicious bone lesions identified. Bilateral hip osteoarthritis again seen, right side greater than left. IMPRESSION: No acute findings within the abdomen or pelvis. Cholelithiasis. No radiographic evidence of cholecystitis. Aortic Atherosclerosis (ICD10-I70.0). Electronically Signed   By: Marlaine Hind M.D.   On: 06/28/2021 13:01      ELIGIBLE FOR AVAILABLE RESEARCH PROTOCOL: no  ASSESSMENT: 65 y.o. [place] man referred for evaluation of pancytopenia, in the setting of rheumatoid arthritis, depression, hypertriglyceridemia and alcohol dependence  REVIEW OF BLOOD FILM 09/12/2020: The CBC has largely normalized, with a white cell count today of 6.2 and platelets 141,000.  Hemoglobin is also improved to 10.8, with an MCV of 103.5.  The smear shows occasional platelet clumps.  There is no left shift in the white cell series which are unremarkable.  The red cells show very rare schistocytes, no tailed poikilocytes, and no evidence of macrocytosis or hypochromia   PLAN: Furkan has made a great effort to curtail drinking and he has something to show for it. His reticulocyte count is up and his Hb is >10. This is mild anemia and in general a  HB of >10 is asymptomatic so this is not the cause of his fatigue. White cells are WNL and platelets are adequate-- note he has clumped platelets on he smear so the actual count is likely higher.  In short I  commended Taha for cutting back on alcohol and encouraged him to continue as he is doing. He does have mild folate deficiency and this is going to be nutritional. I am starting him on supplementation.  I do not have any significant suggestions about the digestive problems. Dr Michail Sermon has started him on carafate and set him up for a gastric emptying study. Perhaps low-dose metoclopramide before meals could help.  Montie will see [place] again in about 6 months with labs a few days before.  Total encounter time 25 minutes.Sarajane Jews C. Magdalyn Arenivas, MD 07/11/2021 1:43 PM Medical Oncology and Hematology [facility] [place], [place] [address] Tel. [phone]    Fax. [phone]   This document serves as a record of services personally performed by Lurline Del, MD. It was created on his behalf by Wilburn Mylar, a trained medical scribe. The creation of this record is based on the scribe's personal observations and the provider's statements to them.   I, Lurline Del MD, have reviewed the above documentation for accuracy and completeness, and I agree with the above.   *Total Encounter Time as defined by the Centers for Medicare and Medicaid Services includes, in addition to the face-to-face time of a patient visit (documented in the note above) non-face-to-face time: obtaining and reviewing outside history, ordering and reviewing medications, tests or procedures, care coordination (communications with other health care professionals or caregivers) and documentation in the medical record.

## 2021-07-10 NOTE — Telephone Encounter (Signed)
Eddie Taylor are you aware pt on Norco?

## 2021-07-11 ENCOUNTER — Other Ambulatory Visit: Payer: Self-pay

## 2021-07-11 ENCOUNTER — Inpatient Hospital Stay: Payer: Medicare HMO | Admitting: Oncology

## 2021-07-11 VITALS — BP 165/109 | HR 102 | Temp 97.7°F | Resp 18 | Ht 72.0 in | Wt 170.6 lb

## 2021-07-11 DIAGNOSIS — E538 Deficiency of other specified B group vitamins: Secondary | ICD-10-CM | POA: Diagnosis not present

## 2021-07-11 DIAGNOSIS — M069 Rheumatoid arthritis, unspecified: Secondary | ICD-10-CM | POA: Diagnosis not present

## 2021-07-11 DIAGNOSIS — E781 Pure hyperglyceridemia: Secondary | ICD-10-CM | POA: Diagnosis not present

## 2021-07-11 DIAGNOSIS — M25562 Pain in left knee: Secondary | ICD-10-CM | POA: Diagnosis not present

## 2021-07-11 DIAGNOSIS — G8929 Other chronic pain: Secondary | ICD-10-CM | POA: Diagnosis not present

## 2021-07-11 DIAGNOSIS — M170 Bilateral primary osteoarthritis of knee: Secondary | ICD-10-CM | POA: Diagnosis not present

## 2021-07-11 DIAGNOSIS — M25561 Pain in right knee: Secondary | ICD-10-CM | POA: Diagnosis not present

## 2021-07-11 DIAGNOSIS — D61818 Other pancytopenia: Secondary | ICD-10-CM

## 2021-07-11 DIAGNOSIS — F32A Depression, unspecified: Secondary | ICD-10-CM | POA: Diagnosis not present

## 2021-07-11 DIAGNOSIS — E785 Hyperlipidemia, unspecified: Secondary | ICD-10-CM | POA: Diagnosis not present

## 2021-07-11 DIAGNOSIS — F17210 Nicotine dependence, cigarettes, uncomplicated: Secondary | ICD-10-CM | POA: Diagnosis not present

## 2021-07-11 DIAGNOSIS — I10 Essential (primary) hypertension: Secondary | ICD-10-CM | POA: Diagnosis not present

## 2021-07-11 DIAGNOSIS — F1020 Alcohol dependence, uncomplicated: Secondary | ICD-10-CM | POA: Diagnosis not present

## 2021-07-11 MED ORDER — FOLIC ACID 1 MG PO TABS: 1.0000 mg | ORAL_TABLET | Freq: Every day | ORAL | 4 refills | Status: AC

## 2021-07-11 NOTE — Telephone Encounter (Signed)
Yes, he told me yesterday.

## 2021-07-11 NOTE — Telephone Encounter (Signed)
Noted. PA submitted with information. Pending response

## 2021-07-17 DIAGNOSIS — G894 Chronic pain syndrome: Secondary | ICD-10-CM | POA: Diagnosis not present

## 2021-07-17 DIAGNOSIS — F1120 Opioid dependence, uncomplicated: Secondary | ICD-10-CM | POA: Diagnosis not present

## 2021-07-17 DIAGNOSIS — M545 Low back pain, unspecified: Secondary | ICD-10-CM | POA: Diagnosis not present

## 2021-07-17 DIAGNOSIS — Z79899 Other long term (current) drug therapy: Secondary | ICD-10-CM | POA: Diagnosis not present

## 2021-07-17 DIAGNOSIS — Z6823 Body mass index (BMI) 23.0-23.9, adult: Secondary | ICD-10-CM | POA: Diagnosis not present

## 2021-07-17 DIAGNOSIS — R030 Elevated blood-pressure reading, without diagnosis of hypertension: Secondary | ICD-10-CM | POA: Diagnosis not present

## 2021-07-17 DIAGNOSIS — G8929 Other chronic pain: Secondary | ICD-10-CM | POA: Diagnosis not present

## 2021-07-17 DIAGNOSIS — F17200 Nicotine dependence, unspecified, uncomplicated: Secondary | ICD-10-CM | POA: Diagnosis not present

## 2021-07-18 ENCOUNTER — Other Ambulatory Visit: Payer: Self-pay

## 2021-07-18 ENCOUNTER — Ambulatory Visit (HOSPITAL_COMMUNITY)
Admission: RE | Admit: 2021-07-18 | Discharge: 2021-07-18 | Disposition: A | Discharge status: Home / Self Care | Payer: Medicare HMO | Source: Ambulatory Visit | Attending: Gastroenterology | Admitting: Gastroenterology

## 2021-07-18 DIAGNOSIS — R1084 Generalized abdominal pain: Secondary | ICD-10-CM | POA: Insufficient documentation

## 2021-07-18 DIAGNOSIS — R112 Nausea with vomiting, unspecified: Secondary | ICD-10-CM | POA: Insufficient documentation

## 2021-07-18 DIAGNOSIS — R109 Unspecified abdominal pain: Secondary | ICD-10-CM | POA: Diagnosis not present

## 2021-07-18 IMAGING — NM NM GASTRIC EMPTYING
4 series · 4 of 4 positions shown · non-contrast
Comparison: None.

CLINICAL DATA: Postprandial nausea and vomiting for 1 month.
Abdominal pain.

EXAM:
NUCLEAR MEDICINE GASTRIC EMPTYING SCAN
TECHNIQUE: After oral ingestion of radiolabeled meal, sequential abdominal
images were obtained for 120 minutes. Residual percentage of
activity remaining within the stomach was calculated at 60 and 120
minutes.
RADIOPHARMACEUTICALS:  2 mCi [X3] sulfur colloid in standardized
meal

[Series 1: 0 min · 4.14mm/px · 1 of 1 slices shown (1 of 2)]
[im 1/1]
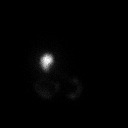

[Series 1: 0 min · 4.14mm/px · 1 of 1 slices shown (2 of 2)]
[im 1/1]
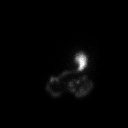

[Series 2: 1 hr · 4.14mm/px · 1 of 1 slices shown (1 of 2)]
[im 1/1]
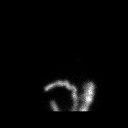

[Series 2: 1 hr · 4.14mm/px · 1 of 1 slices shown (2 of 2)]
[im 1/1]
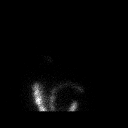

[4 of 4 positions shown; findings below may reference images not displayed]

FINDINGS: Expected location of the stomach in the left upper quadrant.
Ingested meal empties the stomach gradually over the course of the
study with 4% retention at 60 min and <4% retention at 120 min
(normal retention less than 30% at a 120 min).
IMPRESSION: Normal gastric emptying study.

## 2021-07-18 MED ORDER — TECHNETIUM TC 99M SULFUR COLLOID
2.0000 | Freq: Once | INTRAVENOUS | Status: AC | PRN
  Administered 2021-07-18: 13:00:00 2 via INTRAVENOUS

## 2021-07-23 DIAGNOSIS — Z79899 Other long term (current) drug therapy: Secondary | ICD-10-CM | POA: Diagnosis not present

## 2021-08-06 ENCOUNTER — Ambulatory Visit: Payer: Medicare HMO | Admitting: Physician Assistant

## 2021-08-06 ENCOUNTER — Other Ambulatory Visit: Payer: Self-pay

## 2021-08-06 ENCOUNTER — Encounter: Payer: Self-pay | Admitting: Physician Assistant

## 2021-08-06 DIAGNOSIS — F17200 Nicotine dependence, unspecified, uncomplicated: Secondary | ICD-10-CM

## 2021-08-06 DIAGNOSIS — G4700 Insomnia, unspecified: Secondary | ICD-10-CM

## 2021-08-06 DIAGNOSIS — F39 Unspecified mood [affective] disorder: Secondary | ICD-10-CM

## 2021-08-06 DIAGNOSIS — F4323 Adjustment disorder with mixed anxiety and depressed mood: Secondary | ICD-10-CM

## 2021-08-06 DIAGNOSIS — R454 Irritability and anger: Secondary | ICD-10-CM

## 2021-08-06 MED ORDER — ALPRAZOLAM 0.5 MG PO TABS: 0.2500 mg | ORAL_TABLET | Freq: Two times a day (BID) | ORAL | 0 refills | Status: DC | PRN

## 2021-08-06 NOTE — Progress Notes (Signed)
[facility] Check  Patient ID: Eddie [place],  MRN: [id number]  PCP: [name], MD  Date of Evaluation: 08/06/2021 Time spent:30 minutes  Chief Complaint:  Chief Complaint   Anxiety; Depression; Follow-up     HISTORY/CURRENT STATUS: HPI For routine med check.  We increased the Depakote 3 weeks ago and he's not as irritable or angry. Feels a lot better than he was! Doesn't want to change it. He does have some drowsiness in the afternoon, but he doesn't feel like it's bad enough to change anything.  Wants to stay on it another month at least.  He has not had his lab work yet, he sees his primary provider in 2 days.  He still has the order to have that lab work drawn.  At the last visit I gave him a small supply of Xanax.  He has been taking 1 pill daily and it has been helping tremendously.  States he does not get stressed out about the way his wife is treating him or anything else for that matter.  States he is not drinking at the same time he takes the Xanax.  He is down to drinking 1 beer or less per day.  Physically he is doing a lot better as well.  He is able to keep food down now and is not throwing up every day.  He has not gained a lot of weight but has a few pounds and at least is not losing weight like he had been.  Patient denies loss of interest in usual activities and is able to enjoy things.  Denies decreased energy or motivation.  Appetite has not changed.  No extreme sadness, tearfulness, or feelings of hopelessness.  Denies any changes in concentration, making decisions or remembering things.  Sleeps well with the trazodone.  Denies suicidal or homicidal thoughts.  Patient denies increased energy with decreased need for sleep, no increased talkativeness, no racing thoughts, no impulsivity or risky behaviors, no increased spending, no increased libido, no grandiosity, and no hallucinations.  Denies dizziness, syncope, seizures, numbness, tingling, tremor, tics,  unsteady gait, slurred speech, confusion. Denies muscle or joint pain, stiffness, or dystonia.  Individual Medical History/ Review of Systems: Changes? :Yes   see HPI  Past medications for mental health diagnoses include: Wellbutrin, Elavil, Effexor, Ambien, Gabapentin, Xanax, Sonata, Lunesta  Allergies: Bupropion, Morphine and related, Prozac [fluoxetine hcl], and Sulfa antibiotics  Current Medications:  Current Outpatient Medications:    Adalimumab (HUMIRA) 40 MG/0.4ML PSKT, Inject into the skin., Disp: , Rfl:    Adalimumab 40 MG/0.8ML PSKT, Inject 40 mg into the skin once a week., Disp: , Rfl:    allopurinol (ZYLOPRIM) 300 MG tablet, Take 300 mg by mouth every evening., Disp: , Rfl:    divalproex (DEPAKOTE ER) 500 MG 24 hr tablet, Take 3 tablets (1,500 mg total) by mouth at bedtime., Disp: 270 tablet, Rfl: 0   esomeprazole (NEXIUM) 40 MG capsule, Take 40 mg by mouth 2 (two) times daily before a meal., Disp: , Rfl:    folic acid (FOLVITE) 1 MG tablet, Take 1 tablet (1 mg total) by mouth daily., Disp: 90 tablet, Rfl: 4   HYDROcodone-acetaminophen (NORCO) 10-325 MG tablet, Take 1 tablet by mouth every 6 (six) hours as needed for moderate pain or severe pain., Disp: , Rfl:    lisinopril (ZESTRIL) 10 MG tablet, Take 10 mg by mouth daily., Disp: , Rfl:    pravastatin (PRAVACHOL) 40 MG tablet, Take 40 mg by mouth every  evening., Disp: , Rfl:    traZODone (DESYREL) 100 MG tablet, Take 1-2 tablets (100-200 mg total) by mouth at bedtime as needed. for sleep, Disp: 180 tablet, Rfl: 3   VASCEPA 1 g CAPS, Take 2 g by mouth 2 (two) times daily., Disp: , Rfl:    VITAMIN D PO, Take 50,000 Units by mouth once a week., Disp: , Rfl:    ALPRAZolam (XANAX) 0.5 MG tablet, Take 0.5-1 tablets (0.25-0.5 mg total) by mouth 2 (two) times daily as needed for anxiety., Disp: 30 tablet, Rfl: 0   pantoprazole (PROTONIX) 40 MG tablet, Take 1 tablet (40 mg total) by mouth 2 (two) times daily. (Patient not taking: Reported  on 07/09/2021), Disp: , Rfl:    sucralfate (CARAFATE) 1 GM/10ML suspension, Take 10 mLs (1 g total) by mouth 4 (four) times daily -  with meals and at bedtime. (Patient not taking: Reported on 10/09/2020), Disp: 420 mL, Rfl: 0   tamsulosin (FLOMAX) 0.4 MG CAPS capsule, Take 1 capsule (0.4 mg total) by mouth daily. (Patient not taking: Reported on 10/09/2020), Disp: 30 capsule, Rfl: 1 Medication Side Effects: none  Family Medical/ Social History: Changes? No   MENTAL HEALTH EXAM:  There were no vitals taken for this visit.There is no height or weight on file to calculate BMI.  General Appearance: Casual and Well Groomed  Eye Contact:  Good  Speech:  Clear and Coherent and Normal Rate  Volume:  Normal  Mood:  Euthymic  Affect:  Congruent  Thought Process:  Goal Directed and Descriptions of Associations: Circumstantial  Orientation:  Full (Time, Place, and Person)  Thought Content: Logical   Suicidal Thoughts:  No  Homicidal Thoughts:  No  Memory:  WNL  Judgement:  Good  Insight:  Good  Psychomotor Activity:  Normal  Concentration:  Concentration: Good and Attention Span: Fair  Recall:  Good  Fund of Knowledge: Good  Language: Good  Assets:  Desire for Improvement  ADL's:  Intact  Cognition: WNL  Prognosis:  Good     DIAGNOSES:    ICD-10-CM   1. Episodic mood disorder (HCC)  F39     2. Irritability and anger  R45.4     3. Situational mixed anxiety and depressive disorder  F43.23     4. Smoker  F17.200     5. Insomnia, unspecified type  G47.00         Receiving Psychotherapy: Yes With [name]   RECOMMENDATIONS:  PDMP was reviewed.  Last Lunesta filled 09/04/2020.  He is on hydrocodone and buprenorphine, known to me. I provided 30 minutes of face to face time during this encounter, including time spent before and after the visit in records review, medical decision making, counseling pertinent to today's visit, and charting.  I am glad to see him doing better.   No changes in meds will be made. We did discuss the Xanax, I am giving him 30 more pills but that will be it.  If he is still really anxious at the next visit then I will treat with a different medication than a benzodiazepine.  He understands. If the drowsiness does not improve since we increased the Depakote then I may decrease to a total of 1250 mg, but we agree to leave it the same for right now.  He has the order to have a valproic acid level drawn, hopefully Thursday when he sees his PCP.  If she is unable to order it, he will let me know and  I can order it through [facility] or [facility]. Smoking cessation discussed.  Do not drink alcohol when taking the Xanax.  He verbalizes understanding.  Continue Xanax 0.5 mg, 1/2-1 p.o. twice daily as needed. Continue Depakote ER, 3 p.o. nightly. Continue trazodone 100 mg, 1-2 nightly as needed sleep. Continue therapy with [name]. Return in 4 weeks.  Donnal Moat, PA-C

## 2021-08-08 ENCOUNTER — Other Ambulatory Visit: Payer: Self-pay | Admitting: Physician Assistant

## 2021-08-08 DIAGNOSIS — M1990 Unspecified osteoarthritis, unspecified site: Secondary | ICD-10-CM | POA: Diagnosis not present

## 2021-08-08 DIAGNOSIS — E559 Vitamin D deficiency, unspecified: Secondary | ICD-10-CM | POA: Diagnosis not present

## 2021-08-08 DIAGNOSIS — Z87891 Personal history of nicotine dependence: Secondary | ICD-10-CM | POA: Diagnosis not present

## 2021-08-08 DIAGNOSIS — I10 Essential (primary) hypertension: Secondary | ICD-10-CM | POA: Diagnosis not present

## 2021-08-08 DIAGNOSIS — E782 Mixed hyperlipidemia: Secondary | ICD-10-CM | POA: Diagnosis not present

## 2021-08-08 DIAGNOSIS — N400 Enlarged prostate without lower urinary tract symptoms: Secondary | ICD-10-CM | POA: Diagnosis not present

## 2021-08-08 DIAGNOSIS — K529 Noninfective gastroenteritis and colitis, unspecified: Secondary | ICD-10-CM | POA: Diagnosis not present

## 2021-08-08 DIAGNOSIS — R5383 Other fatigue: Secondary | ICD-10-CM | POA: Diagnosis not present

## 2021-08-08 DIAGNOSIS — Z79899 Other long term (current) drug therapy: Secondary | ICD-10-CM | POA: Diagnosis not present

## 2021-08-16 DIAGNOSIS — M545 Low back pain, unspecified: Secondary | ICD-10-CM | POA: Diagnosis not present

## 2021-08-16 DIAGNOSIS — G894 Chronic pain syndrome: Secondary | ICD-10-CM | POA: Diagnosis not present

## 2021-08-16 DIAGNOSIS — F17200 Nicotine dependence, unspecified, uncomplicated: Secondary | ICD-10-CM | POA: Diagnosis not present

## 2021-08-16 DIAGNOSIS — F1120 Opioid dependence, uncomplicated: Secondary | ICD-10-CM | POA: Diagnosis not present

## 2021-08-16 DIAGNOSIS — M069 Rheumatoid arthritis, unspecified: Secondary | ICD-10-CM | POA: Diagnosis not present

## 2021-08-16 DIAGNOSIS — G8929 Other chronic pain: Secondary | ICD-10-CM | POA: Diagnosis not present

## 2021-08-16 DIAGNOSIS — R030 Elevated blood-pressure reading, without diagnosis of hypertension: Secondary | ICD-10-CM | POA: Diagnosis not present

## 2021-08-16 DIAGNOSIS — Z79899 Other long term (current) drug therapy: Secondary | ICD-10-CM | POA: Diagnosis not present

## 2021-08-16 DIAGNOSIS — Z6823 Body mass index (BMI) 23.0-23.9, adult: Secondary | ICD-10-CM | POA: Diagnosis not present

## 2021-08-16 DIAGNOSIS — Z0000 Encounter for general adult medical examination without abnormal findings: Secondary | ICD-10-CM | POA: Diagnosis not present

## 2021-08-28 ENCOUNTER — Observation Stay (HOSPITAL_COMMUNITY)
Admission: EM | Admit: 2021-08-28 | Discharge: 2021-08-30 | Disposition: A | Discharge status: Home / Self Care | Payer: Medicare HMO | Attending: Internal Medicine | Admitting: Internal Medicine

## 2021-08-28 ENCOUNTER — Encounter (HOSPITAL_COMMUNITY): Payer: Self-pay | Admitting: Radiology

## 2021-08-28 ENCOUNTER — Emergency Department (HOSPITAL_COMMUNITY): Payer: Medicare HMO

## 2021-08-28 ENCOUNTER — Observation Stay (HOSPITAL_COMMUNITY): Payer: Medicare HMO

## 2021-08-28 DIAGNOSIS — E7220 Disorder of urea cycle metabolism, unspecified: Secondary | ICD-10-CM | POA: Insufficient documentation

## 2021-08-28 DIAGNOSIS — I10 Essential (primary) hypertension: Secondary | ICD-10-CM | POA: Insufficient documentation

## 2021-08-28 DIAGNOSIS — J439 Emphysema, unspecified: Secondary | ICD-10-CM | POA: Diagnosis not present

## 2021-08-28 DIAGNOSIS — E872 Acidosis, unspecified: Secondary | ICD-10-CM | POA: Insufficient documentation

## 2021-08-28 DIAGNOSIS — Z79899 Other long term (current) drug therapy: Secondary | ICD-10-CM | POA: Diagnosis not present

## 2021-08-28 DIAGNOSIS — R4182 Altered mental status, unspecified: Principal | ICD-10-CM

## 2021-08-28 DIAGNOSIS — E785 Hyperlipidemia, unspecified: Secondary | ICD-10-CM | POA: Diagnosis not present

## 2021-08-28 DIAGNOSIS — E876 Hypokalemia: Secondary | ICD-10-CM | POA: Diagnosis not present

## 2021-08-28 DIAGNOSIS — G9341 Metabolic encephalopathy: Secondary | ICD-10-CM | POA: Diagnosis not present

## 2021-08-28 DIAGNOSIS — F319 Bipolar disorder, unspecified: Secondary | ICD-10-CM | POA: Insufficient documentation

## 2021-08-28 DIAGNOSIS — R531 Weakness: Secondary | ICD-10-CM | POA: Diagnosis not present

## 2021-08-28 DIAGNOSIS — M6281 Muscle weakness (generalized): Secondary | ICD-10-CM | POA: Insufficient documentation

## 2021-08-28 DIAGNOSIS — Z20822 Contact with and (suspected) exposure to covid-19: Secondary | ICD-10-CM | POA: Diagnosis not present

## 2021-08-28 DIAGNOSIS — D539 Nutritional anemia, unspecified: Secondary | ICD-10-CM | POA: Diagnosis not present

## 2021-08-28 DIAGNOSIS — D696 Thrombocytopenia, unspecified: Secondary | ICD-10-CM | POA: Diagnosis not present

## 2021-08-28 DIAGNOSIS — F17210 Nicotine dependence, cigarettes, uncomplicated: Secondary | ICD-10-CM | POA: Insufficient documentation

## 2021-08-28 DIAGNOSIS — R404 Transient alteration of awareness: Secondary | ICD-10-CM | POA: Diagnosis not present

## 2021-08-28 DIAGNOSIS — I672 Cerebral atherosclerosis: Secondary | ICD-10-CM | POA: Diagnosis not present

## 2021-08-28 LAB — CBC WITH DIFFERENTIAL/PLATELET
Abs Immature Granulocytes: 0.01 10*3/uL (ref 0.00–0.07)
Basophils Absolute: 0 10*3/uL (ref 0.0–0.1)
Basophils Relative: 1 %
Eosinophils Absolute: 0.1 10*3/uL (ref 0.0–0.5)
Eosinophils Relative: 3 %
HCT: 37.9 % — ABNORMAL LOW (ref 39.0–52.0)
Hemoglobin: 13.6 g/dL (ref 13.0–17.0)
Immature Granulocytes: 0 %
Lymphocytes Relative: 42 %
Lymphs Abs: 1.4 10*3/uL (ref 0.7–4.0)
MCH: 37.1 pg — ABNORMAL HIGH (ref 26.0–34.0)
MCHC: 35.9 g/dL (ref 30.0–36.0)
MCV: 103.3 fL — ABNORMAL HIGH (ref 80.0–100.0)
Monocytes Absolute: 0.2 10*3/uL (ref 0.1–1.0)
Monocytes Relative: 6 %
Neutro Abs: 1.7 10*3/uL (ref 1.7–7.7)
Neutrophils Relative %: 48 %
Platelets: 122 10*3/uL — ABNORMAL LOW (ref 150–400)
RBC: 3.67 MIL/uL — ABNORMAL LOW (ref 4.22–5.81)
RDW: 12.1 % (ref 11.5–15.5)
WBC: 3.4 10*3/uL — ABNORMAL LOW (ref 4.0–10.5)
nRBC: 0 % (ref 0.0–0.2)

## 2021-08-28 LAB — APTT: aPTT: 24 seconds (ref 24–36)

## 2021-08-28 LAB — COMPREHENSIVE METABOLIC PANEL
ALT: 39 U/L (ref 0–44)
AST: 54 U/L — ABNORMAL HIGH (ref 15–41)
Albumin: 3.9 g/dL (ref 3.5–5.0)
Alkaline Phosphatase: 47 U/L (ref 38–126)
Anion gap: 16 — ABNORMAL HIGH (ref 5–15)
BUN: 5 mg/dL — ABNORMAL LOW (ref 8–23)
CO2: 23 mmol/L (ref 22–32)
Calcium: 9 mg/dL (ref 8.9–10.3)
Chloride: 98 mmol/L (ref 98–111)
Creatinine, Ser: 0.84 mg/dL (ref 0.61–1.24)
GFR, Estimated: >60 mL/min (ref >60)
Glucose, Bld: 94 mg/dL (ref 70–99)
Potassium: 4.3 mmol/L (ref 3.5–5.1)
Sodium: 137 mmol/L (ref 135–145)
Total Bilirubin: 1 mg/dL (ref 0.3–1.2)
Total Protein: 7.3 g/dL (ref 6.5–8.1)

## 2021-08-28 LAB — PROTIME-INR
INR: 1.1 (ref 0.8–1.2)
Prothrombin Time: 14.2 seconds (ref 11.4–15.2)

## 2021-08-28 LAB — I-STAT VENOUS BLOOD GAS, ED
Acid-Base Excess: 1 mmol/L (ref 0.0–2.0)
Bicarbonate: 23.1 mmol/L (ref 20.0–28.0)
Calcium, Ion: 1.04 mmol/L — ABNORMAL LOW (ref 1.15–1.40)
HCT: 33 % — ABNORMAL LOW (ref 39.0–52.0)
Hemoglobin: 11.2 g/dL — ABNORMAL LOW (ref 13.0–17.0)
O2 Saturation: 94 %
Potassium: 3.6 mmol/L (ref 3.5–5.1)
Sodium: 137 mmol/L (ref 135–145)
TCO2: 24 mmol/L (ref 22–32)
pCO2, Ven: 29.5 mmHg — ABNORMAL LOW (ref 44.0–60.0)
pH, Ven: 7.501 — ABNORMAL HIGH (ref 7.250–7.430)
pO2, Ven: 63 mmHg — ABNORMAL HIGH (ref 32.0–45.0)

## 2021-08-28 LAB — RESP PANEL BY RT-PCR (FLU A&B, COVID) ARPGX2
Influenza A by PCR: NEGATIVE
Influenza B by PCR: NEGATIVE
SARS Coronavirus 2 by RT PCR: NEGATIVE

## 2021-08-28 LAB — I-STAT CHEM 8, ED
BUN: 4 mg/dL — ABNORMAL LOW (ref 8–23)
Calcium, Ion: 1.01 mmol/L — ABNORMAL LOW (ref 1.15–1.40)
Chloride: 100 mmol/L (ref 98–111)
Creatinine, Ser: 0.7 mg/dL (ref 0.61–1.24)
Glucose, Bld: 90 mg/dL (ref 70–99)
HCT: 40 % (ref 39.0–52.0)
Hemoglobin: 13.6 g/dL (ref 13.0–17.0)
Potassium: 4.3 mmol/L (ref 3.5–5.1)
Sodium: 136 mmol/L (ref 135–145)
TCO2: 25 mmol/L (ref 22–32)

## 2021-08-28 LAB — CBG MONITORING, ED: Glucose-Capillary: 89 mg/dL (ref 70–99)

## 2021-08-28 LAB — HIV ANTIBODY (ROUTINE TESTING W REFLEX): HIV Screen 4th Generation wRfx: NONREACTIVE

## 2021-08-28 LAB — LACTIC ACID, PLASMA
Lactic Acid, Venous: 4.9 mmol/L (ref 0.5–1.9)
Lactic Acid, Venous: 3.7 mmol/L (ref 0.5–1.9)

## 2021-08-28 LAB — ETHANOL: Alcohol, Ethyl (B): <10 mg/dL (ref <10)

## 2021-08-28 LAB — AMMONIA: Ammonia: 180 umol/L — ABNORMAL HIGH (ref 9–35)

## 2021-08-28 LAB — MAGNESIUM: Magnesium: 0.8 mg/dL — CL (ref 1.7–2.4)

## 2021-08-28 LAB — VALPROIC ACID LEVEL: Valproic Acid Lvl: 39 ug/mL — ABNORMAL LOW (ref 50.0–100.0)

## 2021-08-28 IMAGING — DX DG CHEST 1V PORT
1 series · 1 of 1 positions shown · non-contrast
Comparison: [DATE]

CLINICAL DATA: Altered mental status.

EXAM:
PORTABLE CHEST 1 VIEW

[chest]
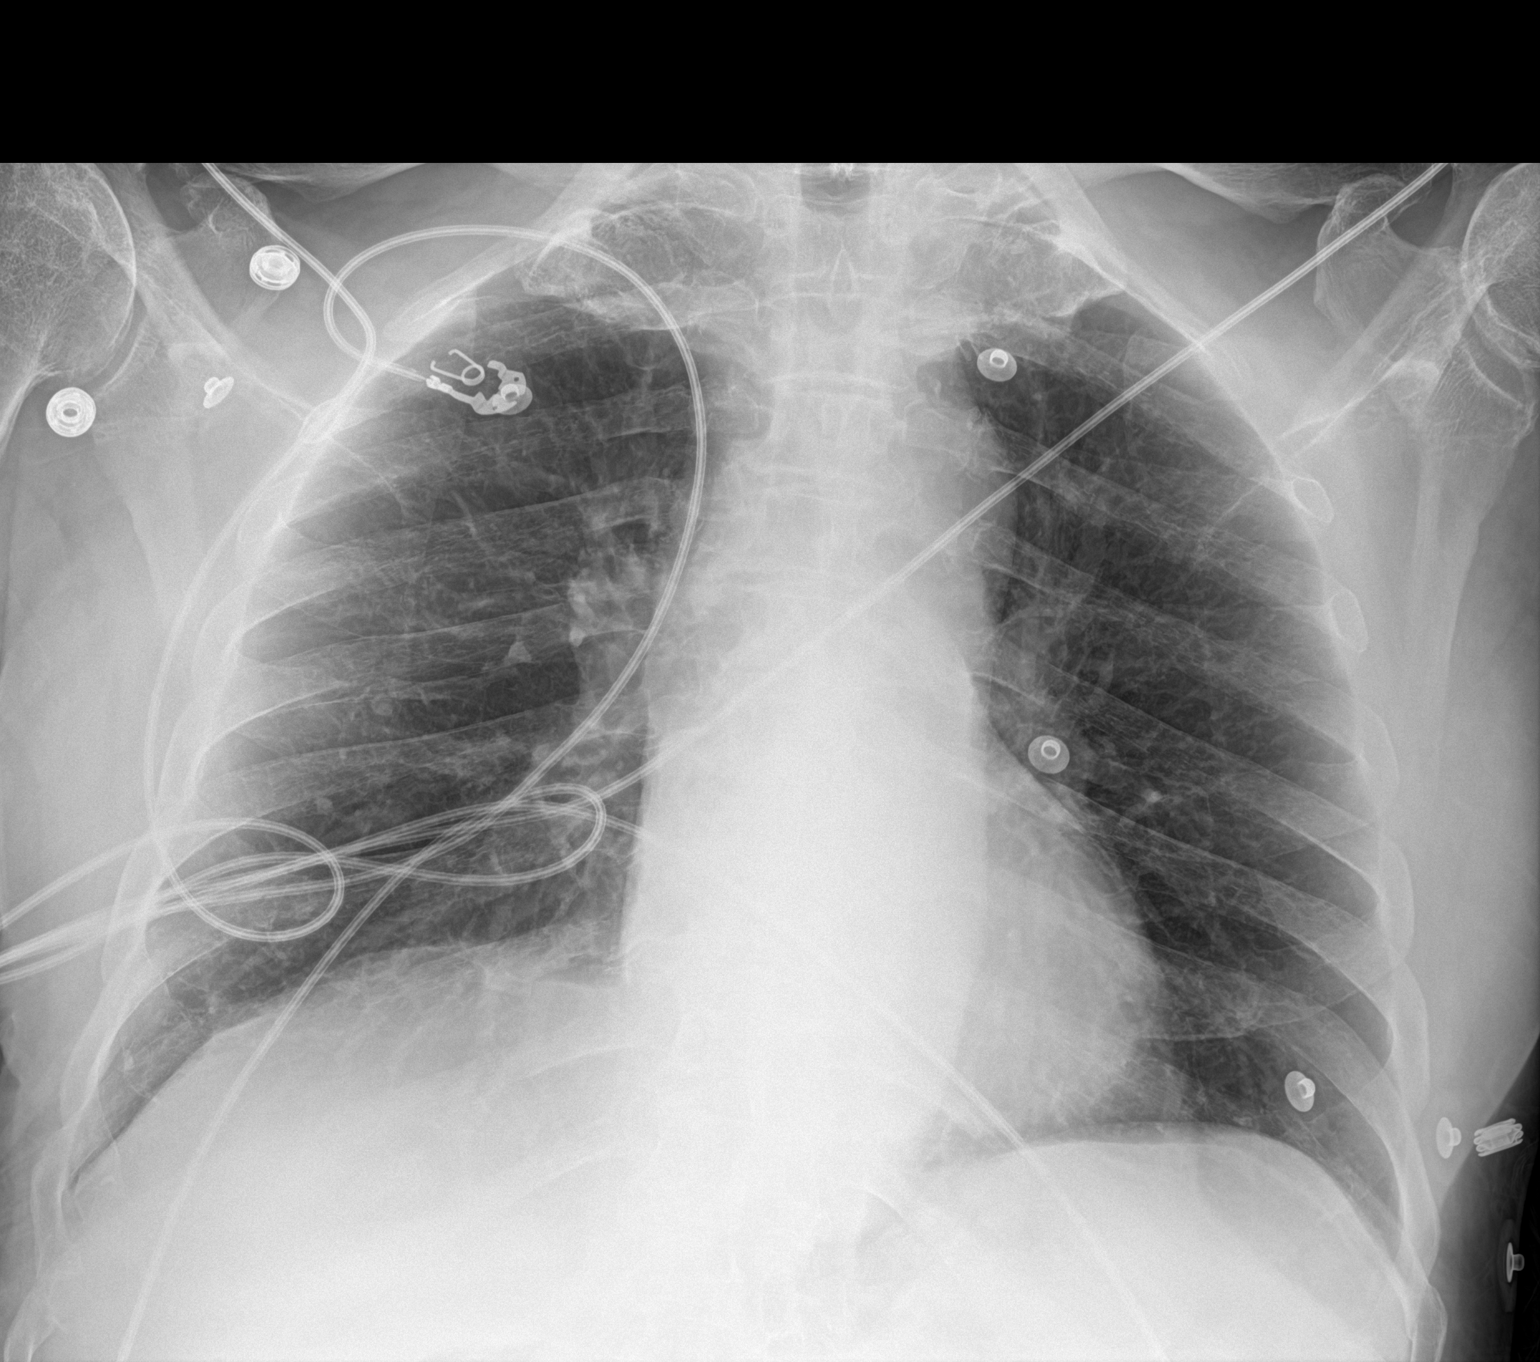

[1 of 1 positions shown; findings below may reference images not displayed]

FINDINGS: Heart size and pulmonary vascularity are normal. Emphysematous
changes are suggested. Scattered calcified granulomas. No airspace
disease or consolidation in the lungs. No pleural effusions. No
pneumothorax. Mediastinal contours appear intact. Degenerative
changes in the spine and shoulders.
IMPRESSION: No active disease.

## 2021-08-28 IMAGING — CT CT HEAD CODE STROKE
3 series · 15 of 47 positions shown, 18 images · non-contrast
Comparison: [DATE]

CLINICAL DATA: Code stroke. Neuro deficit, acute, stroke suspected.
Right-sided weakness



[Series 2: head 5.0 st · axial · 0.42mm/px · z∈[-186,-32]mm · 9 of 37 slices shown, 12 images]
[im 3/37  brain]
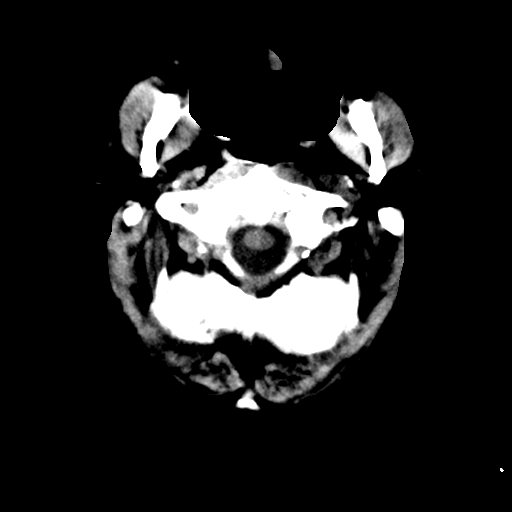
[im 3/37  bone]
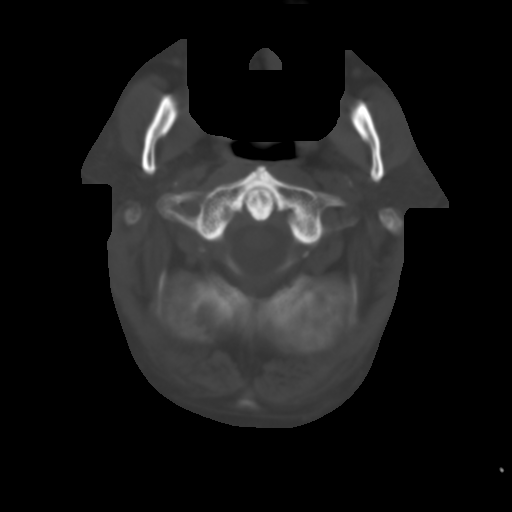
[im 7/37  brain]
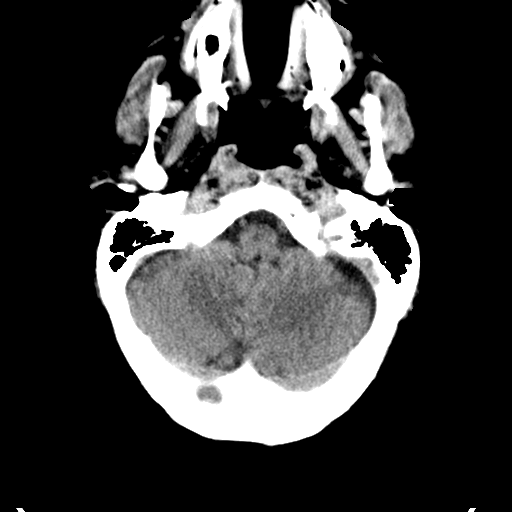
[im 10/37  brain]
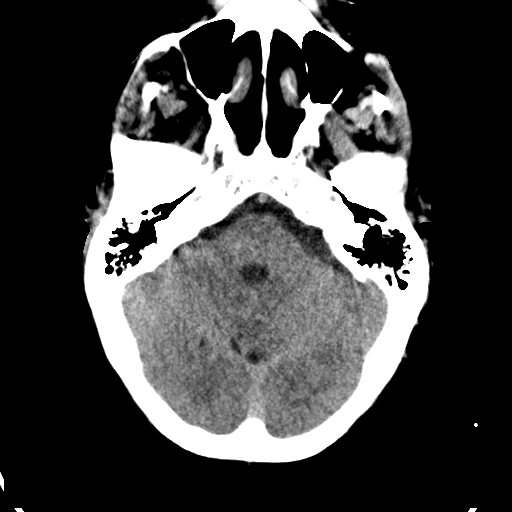
[im 14/37  brain]
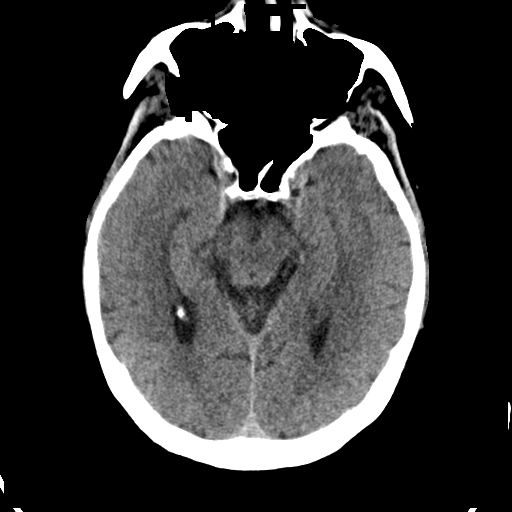
[im 19/37  brain]
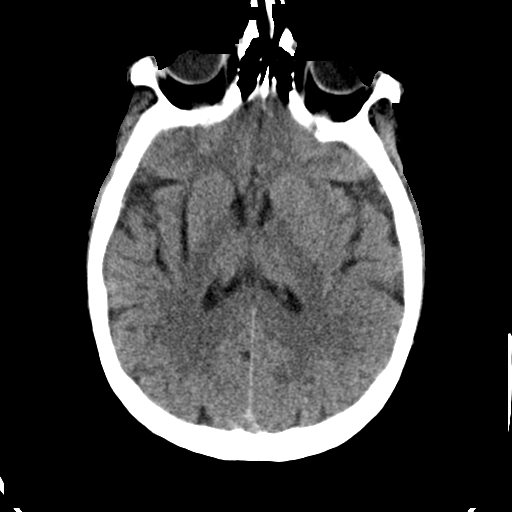
[im 19/37  bone]
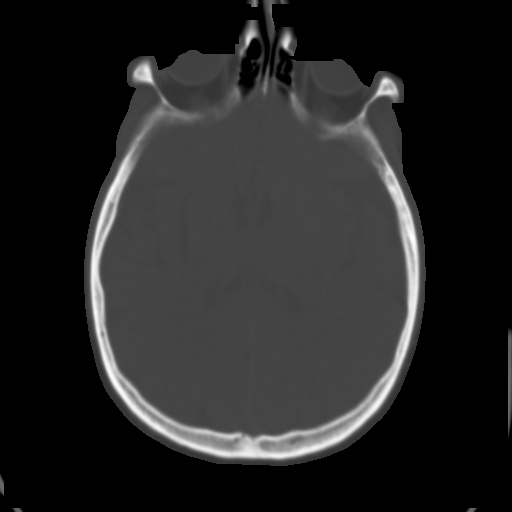
[im 23/37  brain]
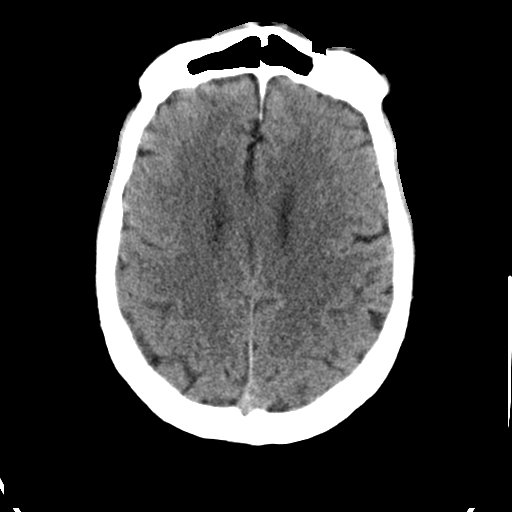
[im 27/37  brain]
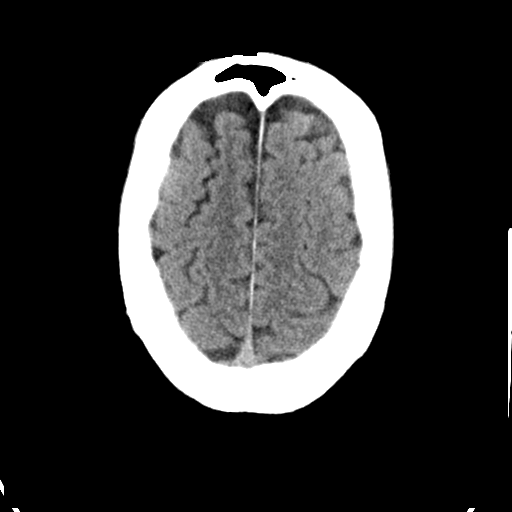
[im 30/37  brain]
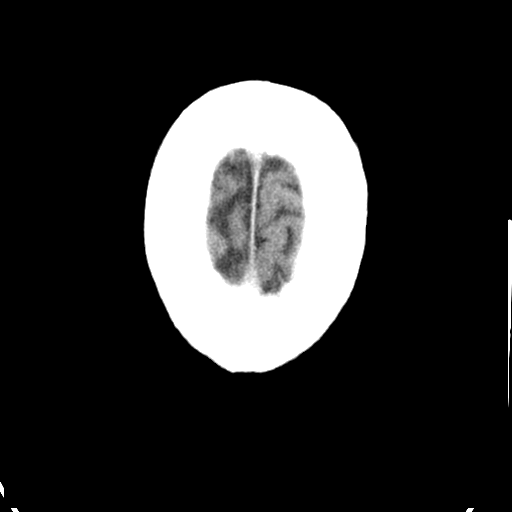
[im 34/37  brain]
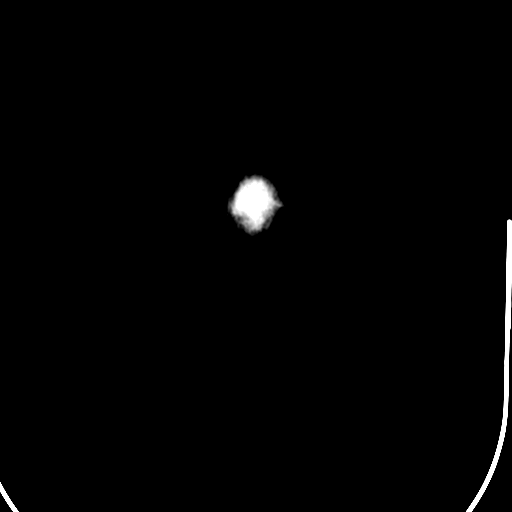
[im 34/37  bone]
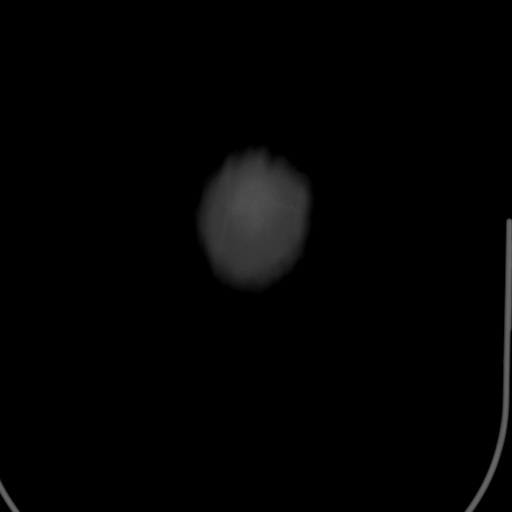

[Series 5: head 3.0 cor st · coronal · 0.30mm/px · 3 of 73 slices shown]
[im 25/73  brain]
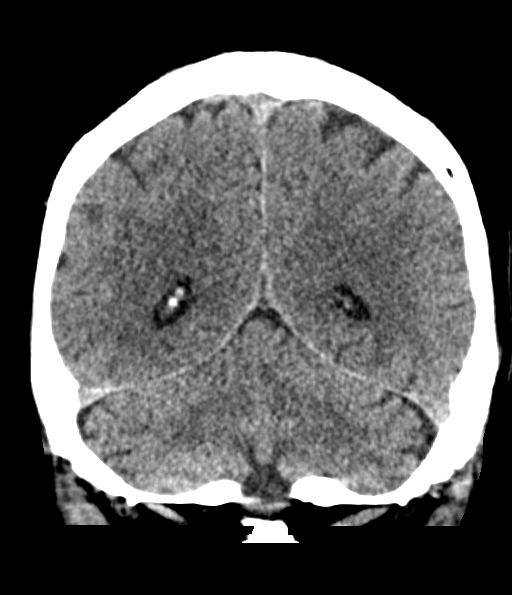
[im 33/73  brain]
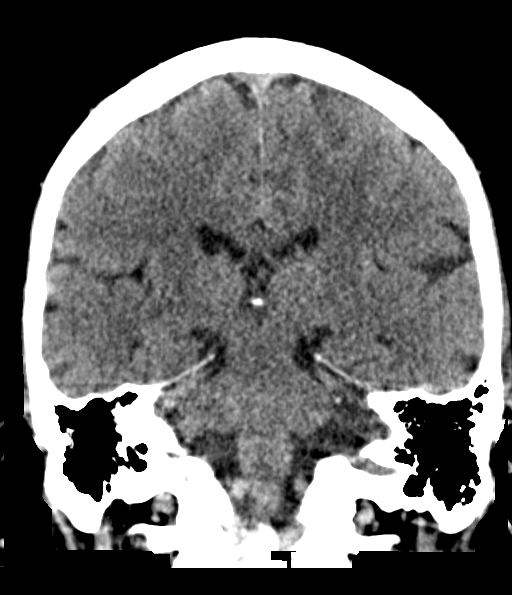
[im 41/73  brain]
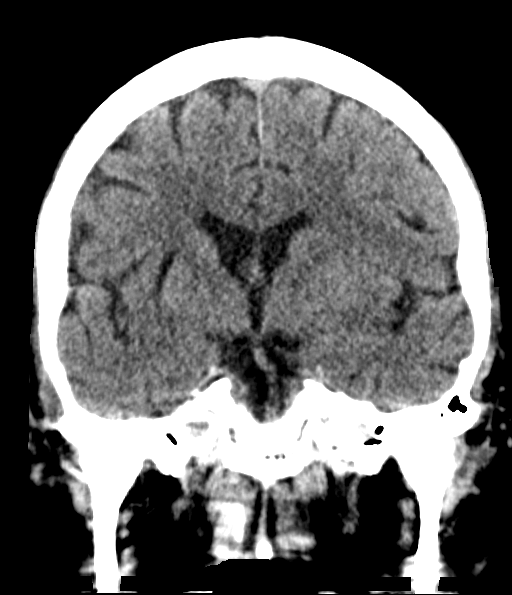

[Series 6: head 3.0 sag st · sagittal · 0.36mm/px · 3 of 56 slices shown]
[im 19/56  brain]
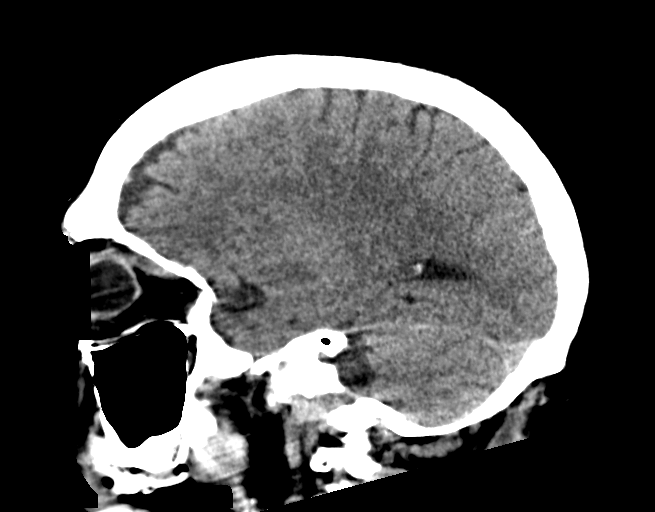
[im 28/56  brain]
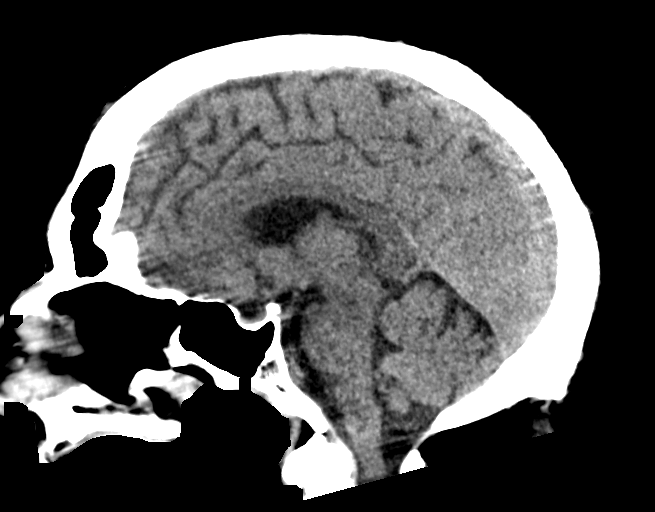
[im 37/56  brain]
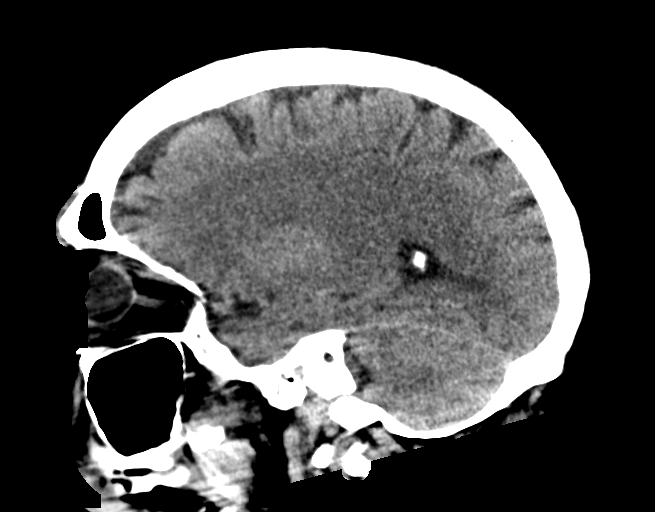

[15 of 47 positions shown; findings below may reference images not displayed]

FINDINGS: Brain: No acute intracranial hemorrhage, mass effect, or edema.
Gray-white differentiation is preserved. Small chronic infarct of
the right caudate. Interval chronic appearing infarct of the right
lentiform nucleus and subinsular white matter. Gray-white
differentiation is preserved. Ventricles and sulci are normal in
size and configuration.

Vascular: No hyperdense vessel.

Skull: Unremarkable.

Sinuses/Orbits: No significant abnormality.

Other: Mastoid air cells are clear.

ASPECTS (Alberta Stroke Program Early CT Score)

- Ganglionic level infarction (caudate, lentiform nuclei, internal
capsule, insula, M1-M3 cortex): 7

- Supraganglionic infarction (M4-M6 cortex): 3

Total score (0-10 with 10 being normal): 10
IMPRESSION: There is no acute intracranial hemorrhage or evidence of acute
infarction. ASPECT score is 10. Chronic right basal ganglia
infarcts.

These results were communicated to Dr. SEIBERT at [DATE] on [DATE]
by text page via the [employer] messaging system.

## 2021-08-28 MED ORDER — LACTATED RINGERS IV BOLUS
1000.0000 mL | Freq: Once | INTRAVENOUS | Status: AC
  Administered 2021-08-28: 1000 mL via INTRAVENOUS

## 2021-08-28 MED ORDER — SODIUM CHLORIDE 0.9 % IV BOLUS
1000.0000 mL | Freq: Once | INTRAVENOUS | Status: AC
Start: 2021-08-28 — End: 2021-08-28
  Administered 2021-08-28: 1000 mL via INTRAVENOUS

## 2021-08-28 MED ORDER — THIAMINE HCL 100 MG/ML IJ SOLN
500.0000 mg | Freq: Every day | INTRAVENOUS | Status: DC
  Administered 2021-08-28 – 2021-08-30 (×3): 500 mg via INTRAVENOUS
  Filled 2021-08-28 (×3): qty 5

## 2021-08-28 MED ORDER — ALLOPURINOL 300 MG PO TABS
300.0000 mg | ORAL_TABLET | Freq: Every evening | ORAL | Status: DC
  Administered 2021-08-28 – 2021-08-29 (×2): 300 mg via ORAL
  Filled 2021-08-28: qty 3
  Filled 2021-08-28: qty 1

## 2021-08-28 MED ORDER — PANTOPRAZOLE SODIUM 40 MG PO TBEC
40.0000 mg | DELAYED_RELEASE_TABLET | Freq: Every day | ORAL | Status: DC
  Administered 2021-08-28 – 2021-08-30 (×3): 40 mg via ORAL
  Filled 2021-08-28 (×3): qty 1

## 2021-08-28 MED ORDER — ENOXAPARIN SODIUM 40 MG/0.4ML IJ SOSY
40.0000 mg | PREFILLED_SYRINGE | INTRAMUSCULAR | Status: DC
  Administered 2021-08-29: 40 mg via SUBCUTANEOUS
  Filled 2021-08-28 (×2): qty 0.4

## 2021-08-28 MED ORDER — IOHEXOL 350 MG/ML SOLN
100.0000 mL | Freq: Once | INTRAVENOUS | Status: AC | PRN
  Administered 2021-08-28: 100 mL via INTRAVENOUS

## 2021-08-28 MED ORDER — TRAZODONE HCL 50 MG PO TABS
100.0000 mg | ORAL_TABLET | Freq: Every evening | ORAL | Status: DC | PRN
  Administered 2021-08-29: 100 mg via ORAL
  Filled 2021-08-28: qty 2

## 2021-08-28 MED ORDER — NALOXONE HCL 2 MG/2ML IJ SOSY
1.0000 mg | PREFILLED_SYRINGE | Freq: Once | INTRAMUSCULAR | Status: AC
  Administered 2021-08-28: 1 mg via INTRAVENOUS
  Filled 2021-08-28: qty 2

## 2021-08-28 MED ORDER — SODIUM CHLORIDE 0.9 % IV SOLN: INTRAVENOUS | Status: AC

## 2021-08-28 MED ORDER — VITAMIN D 25 MCG (1000 UNIT) PO TABS: 50000.0000 [IU] | ORAL_TABLET | ORAL | Status: DC

## 2021-08-28 MED ORDER — LORAZEPAM 1 MG PO TABS
0.0000 mg | ORAL_TABLET | Freq: Four times a day (QID) | ORAL | Status: DC
  Administered 2021-08-28 – 2021-08-29 (×2): 1 mg via ORAL
  Filled 2021-08-28 (×2): qty 1

## 2021-08-28 MED ORDER — LORAZEPAM 1 MG PO TABS: 0.0000 mg | ORAL_TABLET | Freq: Two times a day (BID) | ORAL | Status: DC

## 2021-08-28 MED ORDER — NALOXONE HCL 2 MG/2ML IJ SOSY
1.0000 mg | PREFILLED_SYRINGE | INTRAMUSCULAR | Status: DC | PRN
  Administered 2021-08-28: 1 mg via INTRAVENOUS
  Filled 2021-08-28 (×2): qty 2

## 2021-08-28 MED ORDER — PRAVASTATIN SODIUM 40 MG PO TABS
40.0000 mg | ORAL_TABLET | Freq: Every evening | ORAL | Status: DC
  Administered 2021-08-28 – 2021-08-29 (×2): 40 mg via ORAL
  Filled 2021-08-28 (×2): qty 1

## 2021-08-28 MED ORDER — LISINOPRIL 10 MG PO TABS
10.0000 mg | ORAL_TABLET | Freq: Every day | ORAL | Status: DC
  Administered 2021-08-28 – 2021-08-30 (×3): 10 mg via ORAL
  Filled 2021-08-28 (×3): qty 1

## 2021-08-28 MED ORDER — NALOXONE HCL 0.4 MG/ML IJ SOLN
INTRAMUSCULAR | Status: AC
  Filled 2021-08-28: qty 1

## 2021-08-28 MED ORDER — HYDROCODONE-ACETAMINOPHEN 10-325 MG PO TABS
1.0000 | ORAL_TABLET | Freq: Four times a day (QID) | ORAL | Status: DC | PRN
  Administered 2021-08-29 – 2021-08-30 (×3): 1 via ORAL
  Filled 2021-08-28 (×3): qty 1

## 2021-08-28 MED ORDER — ICOSAPENT ETHYL 1 G PO CAPS
2.0000 g | ORAL_CAPSULE | Freq: Two times a day (BID) | ORAL | Status: DC
  Administered 2021-08-29 – 2021-08-30 (×4): 2 g via ORAL
  Filled 2021-08-28 (×5): qty 2

## 2021-08-28 MED ORDER — LORAZEPAM 2 MG/ML IJ SOLN: 0.0000 mg | Freq: Two times a day (BID) | INTRAMUSCULAR | Status: DC

## 2021-08-28 MED ORDER — LORAZEPAM 2 MG/ML IJ SOLN
0.0000 mg | Freq: Four times a day (QID) | INTRAMUSCULAR | Status: DC
  Filled 2021-08-28: qty 1

## 2021-08-28 MED ORDER — FOLIC ACID 1 MG PO TABS
1.0000 mg | ORAL_TABLET | Freq: Every day | ORAL | Status: DC
  Administered 2021-08-28 – 2021-08-30 (×3): 1 mg via ORAL
  Filled 2021-08-28 (×3): qty 1

## 2021-08-28 MED ORDER — LACTULOSE 10 GM/15ML PO SOLN
30.0000 g | Freq: Two times a day (BID) | ORAL | Status: DC
  Administered 2021-08-28: 30 g via ORAL
  Filled 2021-08-28 (×2): qty 60

## 2021-08-28 MED ORDER — DIVALPROEX SODIUM ER 250 MG PO TB24
250.0000 mg | ORAL_TABLET | Freq: Every day | ORAL | Status: DC
  Administered 2021-08-29 (×2): 250 mg via ORAL
  Filled 2021-08-28 (×4): qty 1

## 2021-08-28 NOTE — Code Documentation (Signed)
Stroke Response Nurse Documentation Code Documentation  Eddie Taylor is a 66 y.o. male arriving to [facility] ED via EMS on 08/28/21 with past medical hx of HTN, ETOH, HLD. On No antithrombotic. Code stroke was activated by ED RN.  Patient from home where he was LKW at 0300. Wife went to check on him this afternoon and he was unresponsive. Once in ED patient noticed to have right sided weakness. While in CT patient received an additional 1mg   of Narcan with slight improvement and equal movement of all extremities.   Stroke team at the bedside on patient arrival to CT.  NIHSS 13, see documentation for details and code stroke times. Patient with decreased LOC and not following commands on exam. The following imaging was completed:CT, CTA head and neck, CTP. Code stroke was cancelled due to no suspicion for acute event.    Bedside handoff with ED RN.  Meda Klinefelter  Stroke Response RN

## 2021-08-28 NOTE — ED Notes (Signed)
Pt sitting up on the side of the bed, alert to self, speech clear.

## 2021-08-28 NOTE — ED Provider Notes (Signed)
[facility] EMERGENCY DEPARTMENT Provider Note   CSN: [id number] Arrival date & time: 08/28/21  1417     History Chief Complaint  Patient presents with   AMS    Eddie Taylor is a 66 y.o. male with history of the below diagnoses who presents to the emergency department with altered mental status.  Patient was last known normal was approximately 3:30 AM per the wife.  The wife states that they were watching TV together between midnight and 1 AM and then she went to bed.  He was acting normal at that time as well.  Patient came to bed around 3:30 AM I did not speak but to her knowledge she was still acting normally.  Patient is a daily alcohol abuser and was drinking last night.  However, he was not drinking more than normal.  Patient also takes chronic hydrocodone daily.  She states that she controls his medications so it is unlikely that he overdosed.  The wife states that she got up around 945 this morning and patient was still in bed.  An hour or 2 when by the wife went to go check on the patient but he was poorly arousable.  After some time, she then called EMS who brought him to the emergency part for evaluation.  Patient Active Problem List   Diagnosis Date Noted   Severe sepsis ([place]) with hypotension, AKI secondary to enteritis (intractible nausea/vomiting, abdominal pain), metabolic acidosis 63/78/5885   Bigeminy 08/22/2020   Demand ischemia ([place]) 08/22/2020   Aortic atherosclerosis ([place]) 08/22/2020   Hyperkalemia 08/22/2020   Tremor 08/22/2020   Hyponatremia 08/22/2020   Acute urinary retention 08/22/2020   Essential hypertension 08/22/2020   Tobacco abuse 08/22/2020   Ileostomy in place [facility]) 08/22/2020   Parastomal hernia 08/22/2020   Rheumatoid arthritis ([place]) on chronic immunosuppressives 08/22/2020   Hypotension 08/19/2020   Hypomagnesemia 08/19/2020   Hypocalcemia 08/19/2020   Intractable nausea and vomiting 08/19/2020   Insomnia 08/19/2020   High  anion gap metabolic acidosis 02/77/4128   AKI (acute kidney injury) ([place]) 08/18/2020   Dehydration    AMS (altered mental status) 02/15/2019   Right shoulder pain 02/15/2019   Pancytopenia ([place]) 02/15/2019   Alcohol dependence ([place]) 02/15/2019   H pylori ulcer 03/14/2013   Abdominal pain, unspecified site 03/13/2013   Peptic ulcer disease 03/13/2013   Cholelithiases 03/13/2013     HPI     Home Medications Prior to Admission medications   Medication Sig Start Date End Date Taking? Authorizing Provider  Adalimumab (HUMIRA) 40 MG/0.4ML PSKT Inject into the skin.    [provider]  Adalimumab 40 MG/0.8ML PSKT Inject 40 mg into the skin once a week.    [provider]  allopurinol (ZYLOPRIM) 300 MG tablet Take 300 mg by mouth every evening. 01/17/19   [provider]  ALPRAZolam Duanne Moron) 0.5 MG tablet Take 0.5-1 tablets (0.25-0.5 mg total) by mouth 2 (two) times daily as needed for anxiety. 08/06/21   Donnal Moat T, PA-C  divalproex (DEPAKOTE ER) 500 MG 24 hr tablet TAKE 2 TABLETS BY MOUTH AT BEDTIME 08/08/21   Hurst, Teresa T, PA-C  esomeprazole (NEXIUM) 40 MG capsule Take 40 mg by mouth 2 (two) times daily before a meal.    [provider]  folic acid (FOLVITE) 1 MG tablet Take 1 tablet (1 mg total) by mouth daily. 07/11/21   Magrinat, Virgie Dad, MD  HYDROcodone-acetaminophen (NORCO) 10-325 MG tablet Take 1 tablet by mouth every  6 (six) hours as needed for moderate pain or severe pain. 08/17/20   [provider]  lisinopril (ZESTRIL) 10 MG tablet Take 10 mg by mouth daily.    [provider]  pantoprazole (PROTONIX) 40 MG tablet Take 1 tablet (40 mg total) by mouth 2 (two) times daily. Patient not taking: Reported on 07/09/2021 08/22/20   Rama, Venetia Maxon, MD  pravastatin (PRAVACHOL) 40 MG tablet Take 40 mg by mouth every evening. 12/05/16   [provider]  sucralfate (CARAFATE) 1 GM/10ML suspension Take 10 mLs (1 g total) by  mouth 4 (four) times daily -  with meals and at bedtime. Patient not taking: Reported on 10/09/2020 08/28/20   Shawna Clamp, MD  tamsulosin (FLOMAX) 0.4 MG CAPS capsule Take 1 capsule (0.4 mg total) by mouth daily. Patient not taking: Reported on 10/09/2020 08/28/20   Shawna Clamp, MD  traZODone (DESYREL) 100 MG tablet Take 1-2 tablets (100-200 mg total) by mouth at bedtime as needed. for sleep 03/12/21   Donnal Moat T, PA-C  VASCEPA 1 g CAPS Take 2 g by mouth 2 (two) times daily. 12/25/16   [provider]  VITAMIN D PO Take 50,000 Units by mouth once a week.    [provider]      Allergies    Bupropion, Morphine and related, Prozac [fluoxetine hcl], and Sulfa antibiotics    Review of Systems   Review of Systems  All other systems reviewed and are negative.  Physical Exam Updated Vital Signs BP (!) 186/102    Pulse 90    Temp 98 F (36.7 C) (Oral)    Resp (!) 24    SpO2 100%  Physical Exam Vitals and nursing note reviewed.  Constitutional:      General: He is in acute distress.  HENT:     Head: Normocephalic and atraumatic.  Eyes:     General:        Right eye: No discharge.        Left eye: No discharge.  Cardiovascular:     Comments: Regular rate and rhythm.  S1/S2 are distinct without any evidence of murmur, rubs, or gallops.  Radial pulses are 2+ bilaterally.  Dorsalis pedis pulses are 2+ bilaterally.  No evidence of pedal edema. Pulmonary:     Comments: Clear to auscultation bilaterally.  Normal effort.  No respiratory distress.  No evidence of wheezes, rales, or rhonchi heard throughout. Abdominal:     General: Abdomen is flat. Bowel sounds are normal. There is no distension.     Tenderness: There is no abdominal tenderness. There is no guarding or rebound.  Musculoskeletal:        General: Normal range of motion.     Cervical back: Neck supple.  Skin:    General: Skin is warm and dry.     Findings: No rash.  Neurological:     General: No focal  deficit present.     Mental Status: He is lethargic.     GCS: GCS eye subscore is 2. GCS verbal subscore is 2. GCS motor subscore is 4.     Comments: Aphasic.  Patient not moving right side of body.  He responds to painful stimuli.  No obvious tremulous activity.  Psychiatric:        Mood and Affect: Mood normal.        Behavior: Behavior normal.    ED Results / Procedures / Treatments   Labs (all labs ordered are listed, but only abnormal  results are displayed) Labs Reviewed  RESP PANEL BY RT-PCR (FLU A&B, COVID) ARPGX2  CBC WITH DIFFERENTIAL/PLATELET  COMPREHENSIVE METABOLIC PANEL  ETHANOL  RAPID URINE DRUG SCREEN, HOSP PERFORMED  LACTIC ACID, PLASMA  LACTIC ACID, PLASMA  PROTIME-INR  APTT  CBG MONITORING, ED  I-STAT CHEM 8, ED    EKG None  Radiology No results found.  Procedures Procedures  Cardiac telemetry shows significant elevated blood pressure.  There is intermittent tachycardic episodes.  Medications Ordered in ED Medications  naloxone (NARCAN) injection 1 mg (has no administration in time range)  sodium chloride 0.9 % bolus 1,000 mL (has no administration in time range)    ED Course/ Medical Decision Making/ A&P                           Medical Decision Making Amount and/or Complexity of Data Reviewed Labs: ordered. Radiology: ordered.  Risk Prescription drug management.   WESSON STITH is a 66 y.o. male with significant morbidities impacts care today including chronic alcohol use, chronic opioid abuse, diverticulitis status post colon resection with colostomy who presents to the emergency department with altered mental status.  I promptly evaluate the patient and he was poorly arousable.  He did respond to painful stimuli.  We promptly gave him 1 of Narcan and patient became more responsive however he was not using his right side.  Patient was groaning.  He is breathing on his own.  Airway is intact. Last known normal was around 330 this morning.   Given the history and him not using his right side code stroke was initiated.  He probably went to CT scanner and was evaluated by neurology.  Labs are still pending.  Another round of Narcan was given and patient became more arousable.  CT head without contrast was negative.  Radiologist and neurology agree.  Head neck was reviewed by neurology and negative per them.  Official read is pending.  The rest of his care will be transferred to [facility], PA-C.  Given the clinical scenario I do believe he would benefit from admission.  Final Clinical Impression(s) / ED Diagnoses Final diagnoses:  None    Rx / DC Orders ED Discharge Orders     None         Hendricks Limes, [place] 08/28/21 1529    Tegeler, Gwenyth Allegra, MD 08/29/21 [phone]

## 2021-08-28 NOTE — ED Triage Notes (Signed)
Pt was brought in by [facility] EMS d/t multiple c/o. Pt heavy drinker last night until 0300. Pt did not get up this am. Wife went to check on him, thought he was dead. Family did approx 5 mins CPR. When EMS arrived pt altered, with strong pulse, breathing on own. Responsive to painful stimuli. Not following commands.   156/84 100% Ra HR 76 102 CBg

## 2021-08-28 NOTE — ED Provider Notes (Signed)
Physical Exam  BP (!) 165/93    Pulse 73    Temp 98 F (36.7 C) (Oral)    Resp 18    SpO2 99%   Physical Exam Vitals and nursing note reviewed.  Constitutional:      Appearance: He is not toxic-appearing.  HENT:     Head: Normocephalic and atraumatic.     Mouth/Throat:     Mouth: Mucous membranes are moist.     Pharynx: No oropharyngeal exudate or posterior oropharyngeal erythema.  Eyes:     General:        Right eye: No discharge.        Left eye: No discharge.     Conjunctiva/sclera: Conjunctivae normal.     Pupils: Pupils are equal, round, and reactive to light.  Cardiovascular:     Rate and Rhythm: Normal rate and regular rhythm.     Pulses: Normal pulses.     Heart sounds: Normal heart sounds. No murmur heard. Pulmonary:     Effort: Pulmonary effort is normal. No respiratory distress.     Breath sounds: Normal breath sounds. No wheezing or rales.  Abdominal:     General: Bowel sounds are normal. There is no distension.     Palpations: Abdomen is soft.     Tenderness: There is no abdominal tenderness. There is no guarding or rebound.  Musculoskeletal:        General: No deformity.     Cervical back: Neck supple.  Skin:    General: Skin is warm and dry.     Capillary Refill: Capillary refill takes less than 2 seconds.  Neurological:     Mental Status: He is alert.     GCS: GCS eye subscore is 4. GCS verbal subscore is 3. GCS motor subscore is 5.     Comments: Patient unable to participate in complete neurologic exam due to inability to follow directions.  He is moving all 4 extremities spontaneously and without difficulty.  He does withdraw from pain.  Nonsensical speech.  Psychiatric:        Mood and Affect: Mood normal.    Procedures  Procedures  ED Course / MDM   Clinical Course as of 08/28/21 1919  Wed Aug 28, 2021  1711 Consult to hospitalist Dr. Roosevelt Locks who is agreeable to seeing this patient and admitting him to his service.  Appreciate the collaboration  in the care of this patient. [RS]    Clinical Course User Index [RS] Deoni Cosey, Gypsy Balsam, PA-C   Medical Decision Making Care of this patient assumed from preceding provider Myna Bright, PA-C at time of shift change.  Please see his associated note for further insight to the patient the course.  In brief patient is a 66 year old male with chronic alcohol abuse who presents with concern for altered mental status.  Last known well around 3:00 this morning.  Initially for intake ED provider patient was completely aphasic with out any movement at all on the right side of his body.  Patient was evaluated by attending physician and code stroke was activated.  His wife states that he chronically would get drunk but has been "cutting back significantly lately".  She states that last night he did get drunk which has not happened in a while.  He is chronically on opiates with hydrocodone but his wife states that she gives them to him that they are usually in her purse.  She is unaware if there are any missing for today.  She  states she did perform CPR initially when she found the patient this morning due to concern for him being completely unarousable.  The differential diagnosis for AMS is extensive and includes, but is not limited to:  Drug overdose - opioids, alcohol, sedatives, antipsychotics, drug withdrawal, others Metabolic: hypoxia, hypoglycemia, hyperglycemia, hypercalcemia, hypernatremia, hyponatremia, uremia, hepatic encephalopathy, hypothyroidism, hyperthyroidism, vitamin B12 or thiamine deficiency, carbon monoxide poisoning, Wilson's disease, Lactic acidosis, DKA/HHOS Infectious: meningitis, encephalitis, bacteremia/sepsis, urinary tract infection, pneumonia, neurosyphilis Structural: Space-occupying lesion, (brain tumor, subdural hematoma, hydrocephalus,) Vascular: stroke, subarachnoid hemorrhage, coronary ischemia, hypertensive encephalopathy, CNS vasculitis, thrombotic thrombocytopenic  purpura, disseminated intravascular coagulation, hyperviscosity Psychiatric: Schizophrenia, depression; Other: Seizure, hypothermia, heat stroke, ICU psychosis, dementia -"sundowning.", korsakoff.  Amount and/or Complexity of Data Reviewed Labs: ordered.    Details: CBC with leukopenia of 3.4, otherwise unremarkable.  CMP with anion gap elevated to 16.  Ethanol less than 10, coags are normal.  Lactic acid significant elevated to 4.9 and ammonia elevated to 180.  Magnesium and depakote pending at this time. Radiology: ordered.    Details: CT Head code stroke negative CT head for hemorrhage or acute infarct, CTA negative for LVO, perfusion without infarct or penumbra. ECG/medicine tests:     Details: Nonischemic EKG  Risk Prescription drug management. Decision regarding hospitalization.    Patient did initially receive 2 mg of Narcan with significant improvement in his mental status though he continues to be disoriented, not following directions.  At this time neurologic exam is significantly improved as above.  Fluid bolus ordered, also ordered thiamine due to concern for acute altered mental status with neurologic findings in context of patient with chronic alcohol abuse.  Patient is neurologic and mental status improving, however he remains significantly altered.  No evidence of infarct on CT A or CT head.  Labs reassuring, does not appear to be infectious, though urine specimen still pending.  Certainly feel patient would benefit from mission to the hospital for further evaluation and stabilization.  His wife is at the bedside and is amenable to plan for admission.  She voiced understanding of his medical evaluation and treatment plan thus far.  Her questions were answered to her expressed satisfaction.  Consult to hospitalist as above.  Patient admitted to the inpatient service.  This chart was dictated using voice recognition software, Dragon. Despite the best efforts of this provider to  proofread and correct errors, errors may still occur which can change documentation meaning.        Aura Dials 08/28/21 Beryle Lathe, MD 08/28/21 2328

## 2021-08-28 NOTE — ED Notes (Signed)
Gave 1 mg narcan per MD order-Neurologist

## 2021-08-28 NOTE — H&P (Addendum)
History and Physical    [name] MCN:[id number] DOB: [birth year] DOA: 08/28/2021  PCP: [name], MD (Confirm with patient/family/NH records and if not entered, this has to be entered at [facility] point of entry) Patient coming from: Home  I have personally briefly reviewed patient's old medical records in [facility]  Chief Complaint: AMS  HPI: [facility] is a 66 y.o. male with medical history significant of bipolar disorder, seizure disorder, anxiety/depression, alcohol abuse, HTN, HLD, RA on DMARDS and narcotics with AMS.  Patient's somnolent, all history provided by wife at bedside.  At baseline patient has had uncontrolled bipolar disorder, psychiatrist recently increased his Depakote dosage to 500 mg twice daily.  However wife reported the patient did not tolerated well, when he was having frequent episodes up lethargy and confusion, and he himself cut down the Depakote dosage from 500 mg HS to 150 mg HS about one week ago and his mentation has secondarily improved.  Patient also weaned off Xanax during the same time.    Patient has arthritis and on the DMARDS and narcotics/hydrocodone PRN, but wife denied patient ever overdose on narcotics before.  Last night, patient had binge drinking of beers mixed with 11% alcohol several 12 ounces bottles.  Wife said the patient was drunk around midnight, when both were watching TV showed. Around midnight, patient picked up a few logs of wood into the stove downstairs claiming he felt cold, when wife went to bed upstairs. Around 3 AM, wife woke up and found patient was still watching TV "appeared to be normal" and she again went to bed herself. Despite, when she woke up this morning, she found the patient was lying on bed, but she was unable to arouse him at all.    EMS arrived, and gave patient one Narcan and patient responded briefly, but was found not using right arm and soon became somnolent again.  ED Course: Afebrile, no hypotension.   Patient was found to have right-sided gaze.  Code stroke was called initially but then canceled by neurology.  CT head negative for acute process.  CT angiogram neck and head negative for large vascular stenosis.  ED think a patient 1 more dose of 5 mg Narcan.  Patient became briefly responsive.  After that patient was noticed to have groaning, alcohol withdrawal was suspected and patient was given Ativan x1.  At the time I saw the patient, he remains somnolent.  Other blood work, ammonium> 110, no history of cirrhosis, neurology considered elevation of ammonia level related to Depakote use.  Lactic acid 4.9> 3.7 after 2 L of IV bolus. UA, chest x-ray pending.   Review of Systems: Unable to perform, patient's somnolent.  Past Medical History:  Diagnosis Date   Anxiety    Arthritis    Depression    Diverticulitis    Gastric ulcer    Hyperlipidemia    Hypertension    Syncope and collapse 02/14/2019    Past Surgical History:  Procedure Laterality Date   BIOPSY  08/28/2020   Procedure: BIOPSY;  Surgeon: Otis Brace, MD;  Location: [place];  Service: Gastroenterology;;   COLON SURGERY     COLOSTOMY     ESOPHAGOGASTRODUODENOSCOPY N/A 03/13/2013   Procedure: ESOPHAGOGASTRODUODENOSCOPY (EGD);  Surgeon: Lear Ng, MD;  Location: [facility] ENDOSCOPY;  Service: Endoscopy;  Laterality: N/A;   ESOPHAGOGASTRODUODENOSCOPY N/A 08/28/2020   Procedure: ESOPHAGOGASTRODUODENOSCOPY (EGD);  Surgeon: Otis Brace, MD;  Location: [facility] ENDOSCOPY;  Service: Gastroenterology;  Laterality: N/A;  rheumatoid arthritis     SURGERY SCROTAL / TESTICULAR  2014     reports that he has been smoking cigarettes. He has been smoking an average of .3 packs per day. He has never used smokeless tobacco. He reports current alcohol use of about 5.0 standard drinks per week. He reports that he does not currently use drugs after having used the following drugs: Cocaine.  Allergies  Allergen Reactions    Bupropion Hives   Morphine And Related     "i go beside myself, i dont know. Its not pretty."    Prozac [Fluoxetine Hcl] Itching   Sulfa Antibiotics Other (See Comments)    Doesn't feel well when taking     Family History  Problem Relation Age of Onset   Brain cancer Mother    Hypertension Mother    Alcohol abuse Mother    Depression Mother    Depression Sister    Heart disease Sister    Alcohol abuse Sister    Hypertension Sister    Drug abuse Brother    Cirrhosis Maternal Grandfather    Hypertension Maternal Grandfather    Hyperlipidemia Maternal Grandfather    Heart disease Maternal Grandmother    Diverticulitis Half-Brother    Depression Half-Brother    Depression Half-Sister      Prior to Admission medications   Medication Sig Start Date End Date Taking? Authorizing Provider  Adalimumab 40 MG/0.8ML PSKT Inject 40 mg into the skin once a week.   Yes [provider]  allopurinol (ZYLOPRIM) 300 MG tablet Take 300 mg by mouth every evening. 01/17/19  Yes [provider]  divalproex (DEPAKOTE ER) 500 MG 24 hr tablet TAKE 2 TABLETS BY MOUTH AT BEDTIME Patient taking differently: Take 1,000 mg by mouth at bedtime. 08/08/21  Yes Hurst, Dorothea Glassman, PA-C  esomeprazole (NEXIUM) 40 MG capsule Take 40 mg by mouth 2 (two) times daily before a meal.   Yes [provider]  folic acid (FOLVITE) 1 MG tablet Take 1 tablet (1 mg total) by mouth daily. 07/11/21  Yes Magrinat, Virgie Dad, MD  HYDROcodone-acetaminophen (NORCO) 10-325 MG tablet Take 1 tablet by mouth every 6 (six) hours as needed for moderate pain or severe pain. 08/17/20  Yes [provider]  lisinopril (ZESTRIL) 10 MG tablet Take 10 mg by mouth daily.   Yes [provider]  pravastatin (PRAVACHOL) 40 MG tablet Take 40 mg by mouth every evening. 12/05/16  Yes [provider]  sucralfate (CARAFATE) 1 GM/10ML suspension Take 10 mLs (1 g total) by mouth 4 (four) times daily -  with  meals and at bedtime. Patient taking differently: Take 1 g by mouth daily as needed (For vomiting per family member). 08/28/20  Yes Shawna Clamp, MD  traZODone (DESYREL) 100 MG tablet Take 1-2 tablets (100-200 mg total) by mouth at bedtime as needed. for sleep Patient taking differently: Take 100-200 mg by mouth at bedtime as needed for sleep. 03/12/21  Yes Hurst, Teresa T, PA-C  VASCEPA 1 g CAPS Take 2 g by mouth 2 (two) times daily. 12/25/16  Yes [provider]  Vitamin D, Ergocalciferol, (DRISDOL) 1.25 MG (50000 UNIT) CAPS capsule Take 50,000 Units by mouth every 7 (seven) days.   Yes [provider]  ALPRAZolam Duanne Moron) 0.5 MG tablet Take 0.5-1 tablets (0.25-0.5 mg total) by mouth 2 (two) times daily as needed for anxiety. Patient not taking: Reported on 08/28/2021 08/06/21   Donnal Moat T, PA-C  pantoprazole (PROTONIX) 40 MG  tablet Take 1 tablet (40 mg total) by mouth 2 (two) times daily. Patient not taking: Reported on 07/09/2021 08/22/20   Rama, Venetia Maxon, MD  tamsulosin (FLOMAX) 0.4 MG CAPS capsule Take 1 capsule (0.4 mg total) by mouth daily. Patient not taking: Reported on 10/09/2020 08/28/20   Shawna Clamp, MD    Physical Exam: Vitals:   08/28/21 1530 08/28/21 1545 08/28/21 1630 08/28/21 1645  BP: (!) 155/94 (!) 165/90 (!) 165/93 (!) 168/94  Pulse: 91 80 73 78  Resp: (!) 31 (!) 26 18 18   Temp:      TempSrc:      SpO2: 99% 99% 99% 100%    Constitutional: NAD, calm, comfortable Vitals:   08/28/21 1530 08/28/21 1545 08/28/21 1630 08/28/21 1645  BP: (!) 155/94 (!) 165/90 (!) 165/93 (!) 168/94  Pulse: 91 80 73 78  Resp: (!) 31 (!) 26 18 18   Temp:      TempSrc:      SpO2: 99% 99% 99% 100%   Eyes: PERRL, lids and conjunctivae normal ENMT: Mucous membranes are dry. Posterior pharynx clear of any exudate or lesions.Normal dentition.  Neck: normal, supple, no masses, no thyromegaly Respiratory: clear to auscultation bilaterally, no wheezing, no crackles. Normal  respiratory effort. No accessory muscle use.  Cardiovascular: Regular rate and rhythm, no murmurs / rubs / gallops. No extremity edema. 2+ pedal pulses. No carotid bruits.  Abdomen: no tenderness, no masses palpated. No hepatosplenomegaly. Bowel sounds positive.  Musculoskeletal: no clubbing / cyanosis. No joint deformity upper and lower extremities. Good ROM, no contractures. Normal muscle tone.  Skin: no rashes, lesions, ulcers. No induration Neurologic: Right sided gaze.  Muscle strength and sensation unable to examine due to mentation changes. Psychiatric: Responded to painful stimuli    Labs on Admission: I have personally reviewed following labs and imaging studies  CBC: Recent Labs  Lab 08/28/21 1449 08/28/21 1456  WBC 3.4*  --   NEUTROABS 1.7  --   HGB 13.6 13.6  HCT 37.9* 40.0  MCV 103.3*  --   PLT 122*  --    Basic Metabolic Panel: Recent Labs  Lab 08/28/21 1449 08/28/21 1456  NA 137 136  K 4.3 4.3  CL 98 100  CO2 23  --   GLUCOSE 94 90  BUN 5* 4*  CREATININE 0.84 0.70  CALCIUM 9.0  --    GFR: CrCl cannot be calculated (Unknown ideal weight.). Liver Function Tests: Recent Labs  Lab 08/28/21 1449  AST 54*  ALT 39  ALKPHOS 47  BILITOT 1.0  PROT 7.3  ALBUMIN 3.9   No results for input(s): LIPASE, AMYLASE in the last 168 hours. Recent Labs  Lab 08/28/21 1528  AMMONIA 180*   Coagulation Profile: Recent Labs  Lab 08/28/21 1449  INR 1.1   Cardiac Enzymes: No results for input(s): CKTOTAL, CKMB, CKMBINDEX, TROPONINI in the last 168 hours. BNP (last 3 results) No results for input(s): PROBNP in the last 8760 hours. HbA1C: No results for input(s): HGBA1C in the last 72 hours. CBG: Recent Labs  Lab 08/28/21 1447  GLUCAP 89   Lipid Profile: No results for input(s): CHOL, HDL, LDLCALC, TRIG, CHOLHDL, LDLDIRECT in the last 72 hours. Thyroid Function Tests: No results for input(s): TSH, T4TOTAL, FREET4, T3FREE, THYROIDAB in the last 72  hours. Anemia Panel: No results for input(s): VITAMINB12, FOLATE, FERRITIN, TIBC, IRON, RETICCTPCT in the last 72 hours. Urine analysis:    Component Value Date/Time   COLORURINE YELLOW 08/18/2020 2014  APPEARANCEUR CLEAR 08/18/2020 2014   LABSPEC 1.011 08/18/2020 2014   PHURINE 5.0 08/18/2020 2014   GLUCOSEU NEGATIVE 08/18/2020 2014   HGBUR MODERATE (A) 08/18/2020 2014   BILIRUBINUR NEGATIVE 08/18/2020 2014   [place] 08/18/2020 2014   PROTEINUR 30 (A) 08/18/2020 2014   UROBILINOGEN 0.2 03/13/2013 0157   NITRITE NEGATIVE 08/18/2020 2014   LEUKOCYTESUR NEGATIVE 08/18/2020 2014    Radiological Exams on Admission: CT HEAD CODE STROKE WO CONTRAST  Result Date: 08/28/2021 CLINICAL DATA:  Code stroke. Neuro deficit, acute, stroke suspected. Right-sided weakness EXAM: CT HEAD WITHOUT CONTRAST TECHNIQUE: Contiguous axial images were obtained from the base of the skull through the vertex without intravenous contrast. RADIATION DOSE REDUCTION: This exam was performed according to the departmental dose-optimization program which includes automated exposure control, adjustment of the mA and/or kV according to patient size and/or use of iterative reconstruction technique. COMPARISON:  July 2020 FINDINGS: Brain: No acute intracranial hemorrhage, mass effect, or edema. Gray-white differentiation is preserved. Small chronic infarct of the right caudate. Interval chronic appearing infarct of the right lentiform nucleus and subinsular white matter. Gray-white differentiation is preserved. Ventricles and sulci are normal in size and configuration. Vascular: No hyperdense vessel. Skull: Unremarkable. Sinuses/Orbits: No significant abnormality. Other: Mastoid air cells are clear. ASPECTS ([place] Stroke Program Early CT Score) - Ganglionic level infarction (caudate, lentiform nuclei, internal capsule, insula, M1-M3 cortex): 7 - Supraganglionic infarction (M4-M6 cortex): 3 Total score (0-10 with 10 being  normal): 10 IMPRESSION: There is no acute intracranial hemorrhage or evidence of acute infarction. ASPECT score is 10. Chronic right basal ganglia infarcts. These results were communicated to Dr. Reeves Forth at 3:05 pm on 08/28/2021 by text page via the [facility] messaging system. Electronically Signed   By: Macy Mis M.D.   On: 08/28/2021 15:16   CT ANGIO HEAD NECK W WO CM W PERF (CODE STROKE)  Result Date: 08/28/2021 CLINICAL DATA:  Neuro deficit, acute, stroke suspected EXAM: CT ANGIOGRAPHY HEAD AND NECK CT PERFUSION BRAIN TECHNIQUE: Multidetector CT imaging of the head and neck was performed using the standard protocol during bolus administration of intravenous contrast. Multiplanar CT image reconstructions and MIPs were obtained to evaluate the vascular anatomy. Carotid stenosis measurements (when applicable) are obtained utilizing NASCET criteria, using the distal internal carotid diameter as the denominator. Multiphase CT imaging of the brain was performed following IV bolus contrast injection. Subsequent parametric perfusion maps were calculated using RAPID software. RADIATION DOSE REDUCTION: This exam was performed according to the departmental dose-optimization program which includes automated exposure control, adjustment of the mA and/or kV according to patient size and/or use of iterative reconstruction technique. CONTRAST:  168mL OMNIPAQUE IOHEXOL 350 MG/ML SOLN COMPARISON:  None. FINDINGS: CTA NECK Aortic arch: Mild calcified plaque along the arch and patent great vessel origins. Right carotid system: Patent. Mixed but primarily calcified plaque along the proximal internal carotid. There is unchanged up to 50% stenosis. Left carotid system: Patent.  Calcified Vertebral arteries: Primarily calcified plaque along the proximal internal carotid. There is unchanged up to 50% stenosis. Skeleton: Degenerative changes of the included spine. Other neck: Unremarkable. Upper chest: Some debris within the trachea.   No apical lung mass. Review of the MIP images confirms the above findings CTA HEAD Anterior circulation: Intracranial internal carotid arteries are patent with minor calcified plaque. Anterior and middle cerebral arteries are patent. Posterior circulation: Intracranial vertebral arteries are patent. Basilar artery is patent. Posterior cerebral arteries are patent. Venous sinuses: Patent as allowed by contrast bolus  timing. Review of the MIP images confirms the above findings CT Brain Perfusion Findings: CBF (<30%) Volume: 70mL Perfusion (Tmax>6.0s) volume: 10mL Mismatch Volume: 36mL Infarction Location: None. IMPRESSION: No large vessel occlusion or hemodynamically significant stenosis. Unchanged plaque at the ICA origins causing up to 50% stenosis. Perfusion imaging demonstrates no evidence of core infarction or penumbra, noting presence of some motion artifact. Electronically Signed   By: Macy Mis M.D.   On: 08/28/2021 15:28    EKG: Independently reviewed.  Chronic nonspecific QRS changes on V1.  Assessment/Plan Principal Problem:   AMS (altered mental status)  (please populate well all problems here in Problem List. (For example, if patient is on BP meds at home and you resume or decide to hold them, it is a problem that needs to be her. Same for CAD, COPD, HLD and so on)  Acute metabolic encephalopathy, GCS=9 -Rule out stroke, given the neurology finding. -MRI when possible -Other Ddx, alcohol withdrawal cannot be ruled out, alcohol level negative, but wife denied patient ever had alcohol withdrawal before despite frequent binge drinking.  Continue CIWA protocol. -UDS and UA pending, checks x-ray pending to rule out infectious sources.  Monitor off antibiotics for now. -Ammonium level elevated significantly, according to neurology, likely elevation of ammonium related to Depakote use.  However wife reported that patient has been under reduced dosage of Depakote for at least 1 week. -As wife has  reported that patient was seen adding more wood to the stove while he was drunk last night and stayed alone downstairs after that for >3 hours, will check methemoglobin level to rule out carbon monoxide level -UDS pending, patient sufficiently protecting airway, will not redose Narcan now. -His pupils are not pinpointed, and breathing not compromised. -Wife reported patient has seizure disorder before but cannot specify what Seizure patient had the past.  EEG when more stable as per neurology. PRN Ativan.  Hyper-ammonia -No Hx of cirrhosis and LFTs largely normal -Lactulose tonight and recheck ammonia level in AM.  Lactic acidosis -No p.o. intake since last night, suspect patient dehydrated, start maintenance IV fluid normal saline at 150 mL/h x 1 day. -Monitor off antibiotics for now.  Alcohol abuse -Reevaluate mentation tomorrow, CIWA protocol with as needed benzos.  Bipolar disorder -Depakote to 250 mg at bedtime.  RA -Outpatient DMARDs  HTN, HLD -Continue home meds  DVT prophylaxis: Lovenox Code Status: Full code Family Communication: Wife at bedside Disposition Plan: Patient came with recommendations with unknown etiology, initial evaluation this is a stroke versus polypharmacy, reevaluate in 24 hours to decide dispo. Consults called: Neurology Admission status: Tele admit   Lequita Halt MD [facility] Pager [phone]  08/28/2021, 6:31 PM

## 2021-08-28 NOTE — ED Notes (Signed)
Patient transported to CT 

## 2021-08-28 NOTE — Consult Note (Addendum)
Neurology Consultation  Reason for Consult: Code stroke, AMS right side weakness Referring Physician: Dr. Alvino Chapel   CC: AMS, right side weakness   History is obtained from: chart  HPI: Eddie Taylor is a 66 y.o. male with past medical history of HTN, RA on chronic immunosuppression, current smoker, chronic pain with opoid use , alcohol abuse PUD who presents to the ED for AMS. Per wife at the bedside, patient was LKW @ 0300am, wife found him this am around 1100am in bed with decreased responsiveness and called EMS. Per wife patient was heavy drinking last night. Patient was given 1mg  narcan IV with some response prior to Code stroke called, however patient was noted to not be moving right side. Code stroke was called. CT head negative for acute process. CTA head/neck negative for LVO. CTP negative for acute infarct. PAtient was given another 1mg  narcan after CT head with some improvement in exam. Code stroke cancelled    LKW: 0300  tpa given?: no, outside of window  Premorbid modified Rankin scale (mRS):  2-Slight disability-UNABLE to perform all activities but does not need assistance    ROS: Full ROS was performed and is negative except as noted in the HPI.  Unable to obtain due to altered mental status.   Past Medical History:  Diagnosis Date   Anxiety    Arthritis    Depression    Diverticulitis    Gastric ulcer    Hyperlipidemia    Hypertension    Syncope and collapse 02/14/2019   Essential (primary) hypertension   Family History  Problem Relation Age of Onset   Brain cancer Mother    Hypertension Mother    Alcohol abuse Mother    Depression Mother    Depression Sister    Heart disease Sister    Alcohol abuse Sister    Hypertension Sister    Drug abuse Brother    Cirrhosis Maternal Grandfather    Hypertension Maternal Grandfather    Hyperlipidemia Maternal Grandfather    Heart disease Maternal Grandmother    Diverticulitis Half-Brother    Depression  Half-Brother    Depression Half-Sister      Social History:   reports that he has been smoking cigarettes. He has been smoking an average of .3 packs per day. He has never used smokeless tobacco. He reports current alcohol use of about 5.0 standard drinks per week. He reports that he does not currently use drugs after having used the following drugs: Cocaine.  Medications  Current Facility-Administered Medications:    naloxone (NARCAN) 0.4 MG/ML injection, , , ,    naloxone (NARCAN) injection 1 mg, 1 mg, Intravenous, PRN, Myna Bright M, PA-C, 1 mg at 08/28/21 1526  Current Outpatient Medications:    Adalimumab (HUMIRA) 40 MG/0.4ML PSKT, Inject into the skin., Disp: , Rfl:    Adalimumab 40 MG/0.8ML PSKT, Inject 40 mg into the skin once a week., Disp: , Rfl:    allopurinol (ZYLOPRIM) 300 MG tablet, Take 300 mg by mouth every evening., Disp: , Rfl:    ALPRAZolam (XANAX) 0.5 MG tablet, Take 0.5-1 tablets (0.25-0.5 mg total) by mouth 2 (two) times daily as needed for anxiety., Disp: 30 tablet, Rfl: 0   divalproex (DEPAKOTE ER) 500 MG 24 hr tablet, TAKE 2 TABLETS BY MOUTH AT BEDTIME, Disp: 180 tablet, Rfl: 0   esomeprazole (NEXIUM) 40 MG capsule, Take 40 mg by mouth 2 (two) times daily before a meal., Disp: , Rfl:    folic acid (FOLVITE)  1 MG tablet, Take 1 tablet (1 mg total) by mouth daily., Disp: 90 tablet, Rfl: 4   HYDROcodone-acetaminophen (NORCO) 10-325 MG tablet, Take 1 tablet by mouth every 6 (six) hours as needed for moderate pain or severe pain., Disp: , Rfl:    lisinopril (ZESTRIL) 10 MG tablet, Take 10 mg by mouth daily., Disp: , Rfl:    pantoprazole (PROTONIX) 40 MG tablet, Take 1 tablet (40 mg total) by mouth 2 (two) times daily. (Patient not taking: Reported on 07/09/2021), Disp: , Rfl:    pravastatin (PRAVACHOL) 40 MG tablet, Take 40 mg by mouth every evening., Disp: , Rfl:    sucralfate (CARAFATE) 1 GM/10ML suspension, Take 10 mLs (1 g total) by mouth 4 (four) times daily -   with meals and at bedtime. (Patient not taking: Reported on 10/09/2020), Disp: 420 mL, Rfl: 0   tamsulosin (FLOMAX) 0.4 MG CAPS capsule, Take 1 capsule (0.4 mg total) by mouth daily. (Patient not taking: Reported on 10/09/2020), Disp: 30 capsule, Rfl: 1   traZODone (DESYREL) 100 MG tablet, Take 1-2 tablets (100-200 mg total) by mouth at bedtime as needed. for sleep, Disp: 180 tablet, Rfl: 3   VASCEPA 1 g CAPS, Take 2 g by mouth 2 (two) times daily., Disp: , Rfl:    VITAMIN D PO, Take 50,000 Units by mouth once a week., Disp: , Rfl:    Exam: Current vital signs: BP (!) 166/99    Pulse (!) 111    Temp 98 F (36.7 C) (Oral)    Resp (!) 24    SpO2 99%  Vital signs in last 24 hours: Temp:  [98 F (36.7 C)] 98 F (36.7 C) (02/08 1421) Pulse Rate:  [82-111] 111 (02/08 1500) Resp:  [19-31] 24 (02/08 1500) BP: (156-186)/(95-107) 166/99 (02/08 1500) SpO2:  [99 %-100 %] 99 % (02/08 1500)  GENERAL: elderly frail male lying in bed, not responsive to voice, minimally responsive to painful stimuli  HEENT: - Normocephalic and atraumatic, dry mm LUNGS - Clear to auscultation bilaterally with no wheezes CV - S1S2 RRR, no m/r/g, equal pulses bilaterally. ABDOMEN - Soft, nontender, nondistended with normoactive BS Ext: warm, well perfused, intact peripheral pulses, no edema  NEURO:  Mental Status: Patient is nonverbal, not responsive to voice, does not follow command,  withdraws in lower extremities to painful stimuli Language: Unable to assess due to AMS  Cranial Nerves: PERRL 12mm/sluggish. EOMI, visual fields full, no facial asymmetry, facial sensation  tongue/uvula/soft palate midline, normal sternocleidomastoid and trapezius muscle strength. No evidence of tongue atrophy or fibrillations Motor: flickers to painful stimuli in bilateral uppers, withdraws in bilateral lowers  Tone: is normal and bulk is normal Sensation- no response to painful stimuli in uppers bilaterally, responds to noxious stimuli in  bilateral lowers Coordination: Unable to assess Gait- deferred  NIHSS 1a Level of Conscious.: 2 1b LOC Questions: 2 1c LOC Commands: 2 2 Best Gaze: 0 3 Visual: 0 4 Facial Palsy: 0 5a Motor Arm - left: 3 5b Motor Arm - Right: 3 6a Motor Leg - Left: 2 6b Motor Leg - Right: 2 7 Limb Ataxia: 0 8 Sensory: 1 9 Best Language: 1 10 Dysarthria: 1 11 Extinct. and Inatten.: 0 TOTAL: 19    Labs I have reviewed labs in epic and the results pertinent to this consultation are:  ETOH <10 Ammonia pending  Lactic acid pending  UDS ordered   Imaging I have reviewed the images obtained:  CT-head There is no acute intracranial  hemorrhage or evidence of acute infarction. ASPECT score is 10. Chronic right basal ganglia infarcts  CTA head/neck  No large vessel occlusion or hemodynamically significant stenosis.  Unchanged plaque at the ICA origins causing up to 50% stenosis  CT Perf  no evidence of core infarction or penumbra, noting presence of some motion artifact.  Assessment:  Eddie Taylor is a 66 y.o. male with past medical history of HTN, RA on chronic immunosuppression, current smoker, chronic pain with opoid use , alcohol abuse PUD who presents to the ED for AMS. Per wife at the bedside, patient was LKW @ 0300am, wife found him this am around 1100am in bed poorly responsive and called EMS. Per wife patient was heavy drinking last night. Patient was given a total of 2 mg IV narcan with some improvement in exam after administration of each narcan push. Code stroke was cancelled due to negative head imaging   Impression: Differential Diagnosis could be accidental overdose of home hydrocodone vs metabolic derangement vs infection vs seizures  Recommendations: -Code stroke cancelled. - obtain UDS - rule out infection, labs, CXR, UA  - Hydrate  - Thiamine and folate  -EEG if no improvement.   Neurology will sign off, please call back with questions or concerns  Beulah Gandy DNP,  ACNPC-AG   ATTENDING ATTESTATION:  Code stroke activated for right side weakness, unresponsive. On exam he was not responding to commands. CTA and CTP completed and neg. His attention improved slightly with narcan but did not follow commands. Eventually would with draw to painful stimuli in all 4 ext.  H/o ETOH use chronically. Code stroke cancelled. AMS workup as above.  Dr. Reeves Forth evaluated pt independently, reviewed imaging, chart, labs. Discussed and formulated plan with the APP. Please see APP note above for details.   High MDM with code stroke workup  reviewing CTA/CTP spent on counseling patient and coordinating care, writing notes and reviewing chart and discussing with ER physician and radiology.    Thea Holshouser,MD

## 2021-08-28 NOTE — ED Notes (Signed)
Pt placed in hospital bed, ambulatory with stand-by assist from ED stretcher to bed.

## 2021-08-29 ENCOUNTER — Observation Stay (HOSPITAL_COMMUNITY): Payer: Medicare HMO

## 2021-08-29 DIAGNOSIS — E7220 Disorder of urea cycle metabolism, unspecified: Secondary | ICD-10-CM | POA: Diagnosis not present

## 2021-08-29 DIAGNOSIS — R410 Disorientation, unspecified: Secondary | ICD-10-CM | POA: Diagnosis not present

## 2021-08-29 DIAGNOSIS — G9389 Other specified disorders of brain: Secondary | ICD-10-CM | POA: Diagnosis not present

## 2021-08-29 DIAGNOSIS — G9341 Metabolic encephalopathy: Secondary | ICD-10-CM | POA: Diagnosis not present

## 2021-08-29 DIAGNOSIS — E876 Hypokalemia: Secondary | ICD-10-CM | POA: Diagnosis not present

## 2021-08-29 DIAGNOSIS — R4182 Altered mental status, unspecified: Secondary | ICD-10-CM | POA: Diagnosis not present

## 2021-08-29 DIAGNOSIS — Z20822 Contact with and (suspected) exposure to covid-19: Secondary | ICD-10-CM | POA: Diagnosis not present

## 2021-08-29 DIAGNOSIS — E785 Hyperlipidemia, unspecified: Secondary | ICD-10-CM | POA: Diagnosis not present

## 2021-08-29 DIAGNOSIS — I10 Essential (primary) hypertension: Secondary | ICD-10-CM | POA: Diagnosis not present

## 2021-08-29 DIAGNOSIS — E872 Acidosis, unspecified: Secondary | ICD-10-CM | POA: Diagnosis not present

## 2021-08-29 LAB — URINALYSIS, ROUTINE W REFLEX MICROSCOPIC
Bacteria, UA: NONE SEEN
Bilirubin Urine: NEGATIVE
Glucose, UA: NEGATIVE mg/dL
Ketones, ur: NEGATIVE mg/dL
Leukocytes,Ua: NEGATIVE
Nitrite: NEGATIVE
Protein, ur: NEGATIVE mg/dL
Specific Gravity, Urine: 1.008 (ref 1.005–1.030)
pH: 7 (ref 5.0–8.0)

## 2021-08-29 LAB — CBC
HCT: 32.4 % — ABNORMAL LOW (ref 39.0–52.0)
Hemoglobin: 11.7 g/dL — ABNORMAL LOW (ref 13.0–17.0)
MCH: 38 pg — ABNORMAL HIGH (ref 26.0–34.0)
MCHC: 36.1 g/dL — ABNORMAL HIGH (ref 30.0–36.0)
MCV: 105.2 fL — ABNORMAL HIGH (ref 80.0–100.0)
Platelets: 90 10*3/uL — ABNORMAL LOW (ref 150–400)
RBC: 3.08 MIL/uL — ABNORMAL LOW (ref 4.22–5.81)
RDW: 12.3 % (ref 11.5–15.5)
WBC: 5 10*3/uL (ref 4.0–10.5)
nRBC: 0 % (ref 0.0–0.2)

## 2021-08-29 LAB — BASIC METABOLIC PANEL
Anion gap: 11 (ref 5–15)
BUN: 5 mg/dL — ABNORMAL LOW (ref 8–23)
CO2: 22 mmol/L (ref 22–32)
Calcium: 8.4 mg/dL — ABNORMAL LOW (ref 8.9–10.3)
Chloride: 105 mmol/L (ref 98–111)
Creatinine, Ser: 0.74 mg/dL (ref 0.61–1.24)
GFR, Estimated: >60 mL/min (ref >60)
Glucose, Bld: 91 mg/dL (ref 70–99)
Potassium: 2.9 mmol/L — ABNORMAL LOW (ref 3.5–5.1)
Sodium: 138 mmol/L (ref 135–145)

## 2021-08-29 LAB — POTASSIUM: Potassium: 3.1 mmol/L — ABNORMAL LOW (ref 3.5–5.1)

## 2021-08-29 LAB — RAPID URINE DRUG SCREEN, HOSP PERFORMED
Amphetamines: NOT DETECTED
Barbiturates: NOT DETECTED
Benzodiazepines: NOT DETECTED
Cocaine: NOT DETECTED
Opiates: POSITIVE — AB
Tetrahydrocannabinol: NOT DETECTED

## 2021-08-29 LAB — AMMONIA: Ammonia: 24 umol/L (ref 9–35)

## 2021-08-29 LAB — MAGNESIUM: Magnesium: 1.9 mg/dL (ref 1.7–2.4)

## 2021-08-29 IMAGING — MR MR HEAD W/O CM
9 of 12 series · 35 of 48 positions shown · non-contrast
Comparison: CT [DATE].  MRI [DATE].

CLINICAL DATA: Delirium

EXAM:
MRI HEAD WITHOUT CONTRAST
TECHNIQUE: Multiplanar, multiecho pulse sequences of the brain and surrounding
structures were obtained without intravenous contrast.

[Series 3: DWI · axial · 3.0mm · 1.09mm/px · z∈[-28,+104]mm · 9 of 92 slices shown (1 of 4)]
[im 1/92]
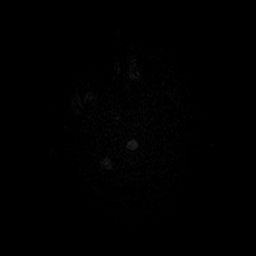
[im 12/92]
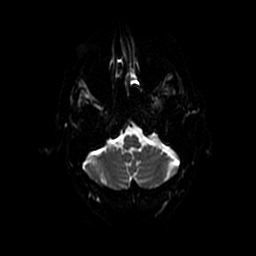
[im 23/92]
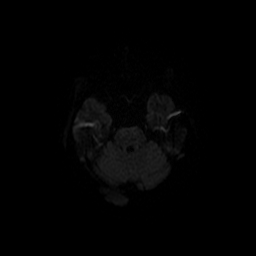
[im 35/92]
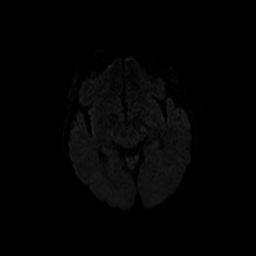
[im 46/92]
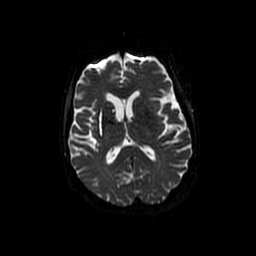
[im 57/92]
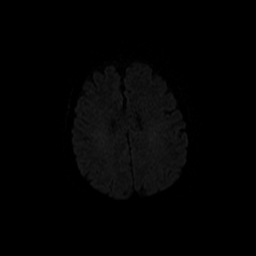
[im 69/92]
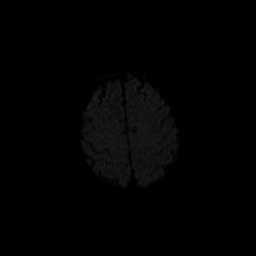
[im 80/92]
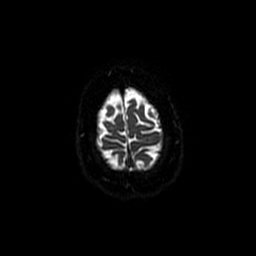
[im 92/92]
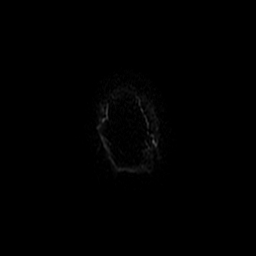

[Series 4: DWI · coronal · 5.0mm · 1.09mm/px · 6 of 68 slices shown (2 of 4)]
[im 1/68]
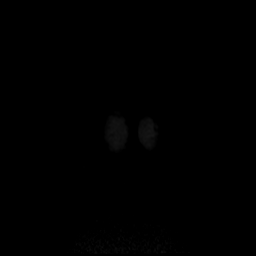
[im 14/68]
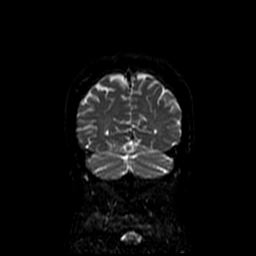
[im 27/68]
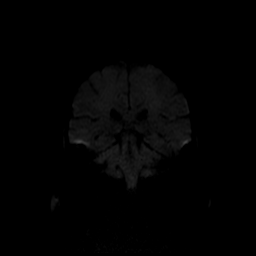
[im 41/68]
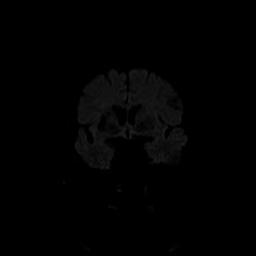
[im 54/68]
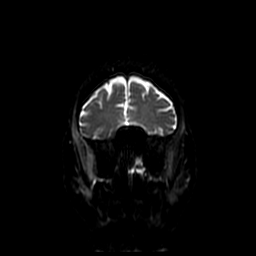
[im 68/68]
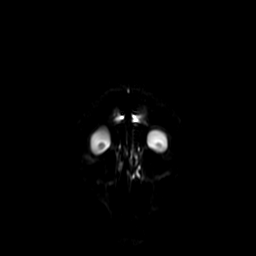

[Series 6: T2 · axial · 5.0mm · 0.43mm/px · z∈[-58,+78]mm · 2 of 24 slices shown (1 of 3)]
[im 1/24]
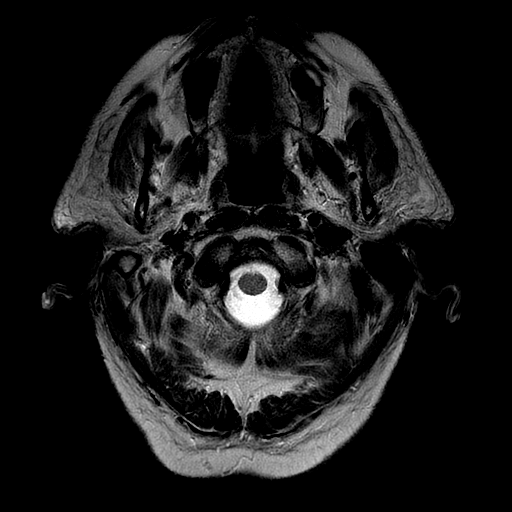
[im 24/24]
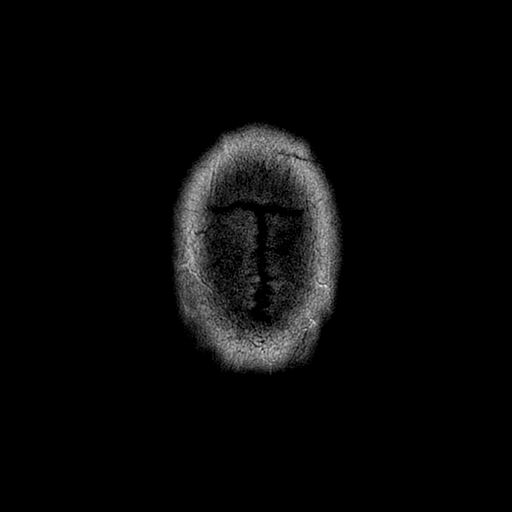

[Series 7: FLAIR · axial · 3.0mm · 0.43mm/px · z∈[-58,+78]mm · 2 of 24 slices shown (1 of 2)]
[im 1/24]
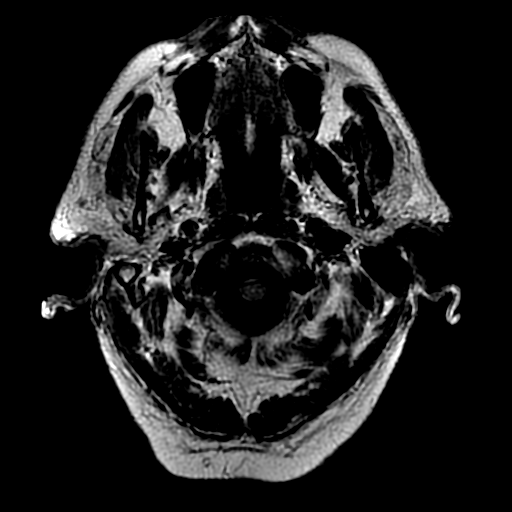
[im 24/24]
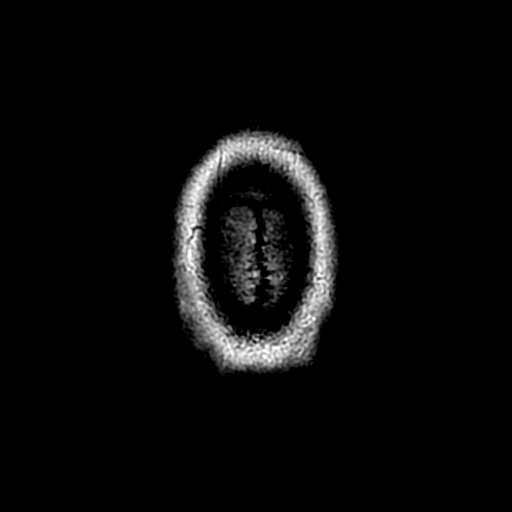

[Series 10: T2 · coronal · 3.0mm · 0.35mm/px · 3 of 27 slices shown (2 of 3)]
[im 1/27]
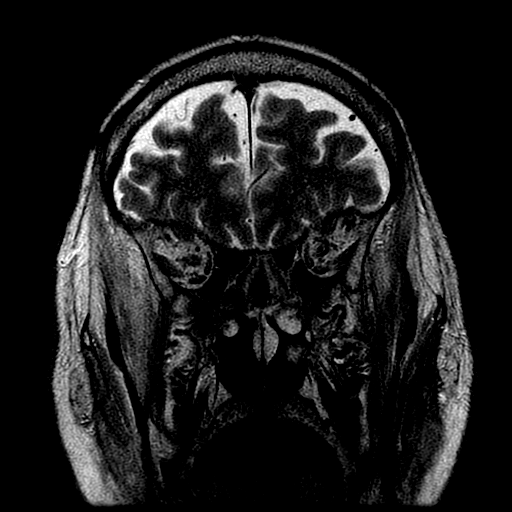
[im 14/27]
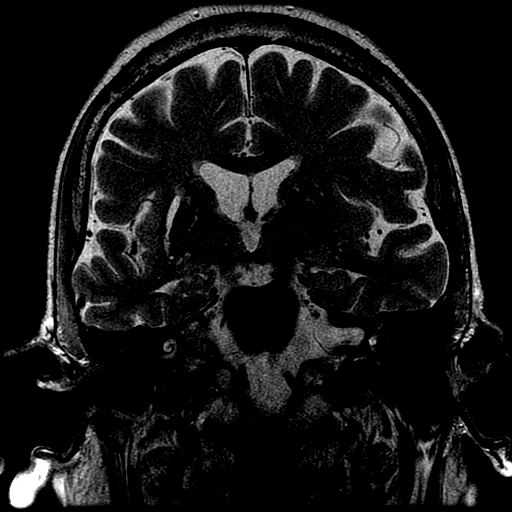
[im 27/27]
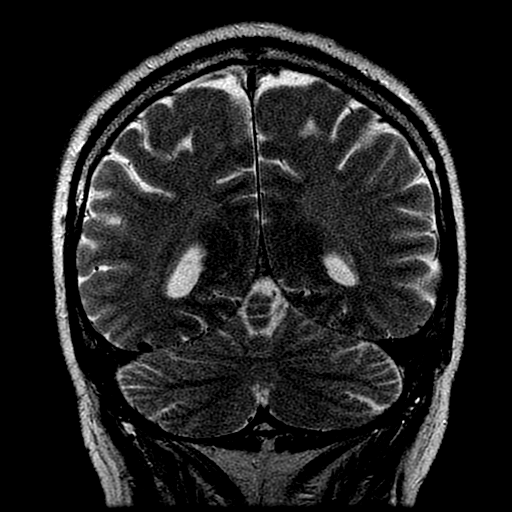

[Series 12: FLAIR · coronal · 3.0mm · 0.39mm/px · 3 of 32 slices shown (2 of 2)]
[im 1/32]
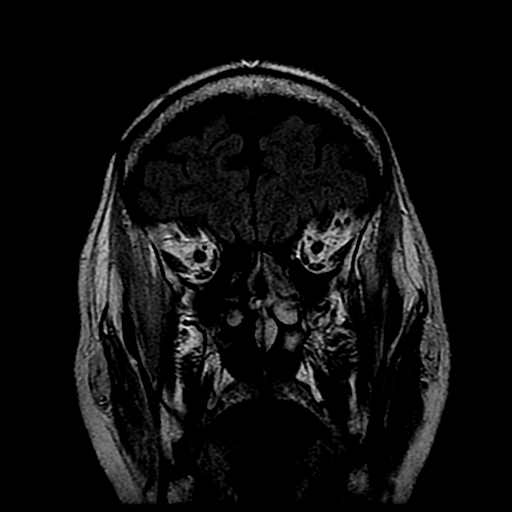
[im 16/32]
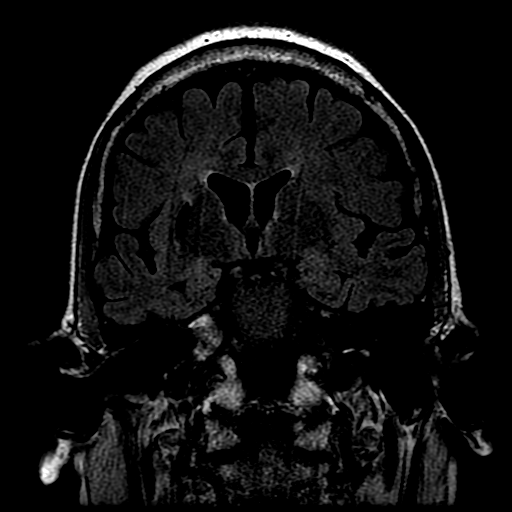
[im 32/32]
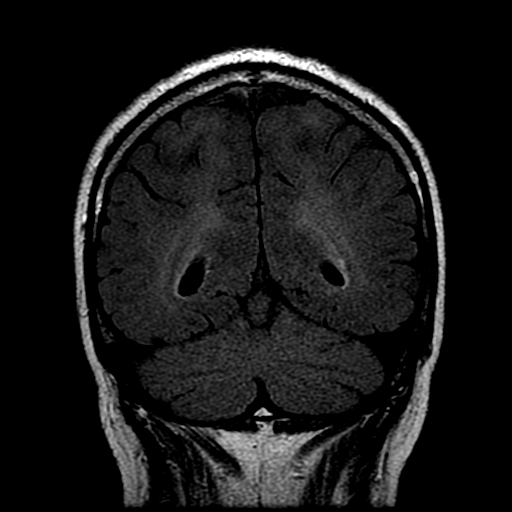

[Series 13: T2 · coronal · 5.0mm · 0.43mm/px · 3 of 29 slices shown (3 of 3)]
[im 1/29]
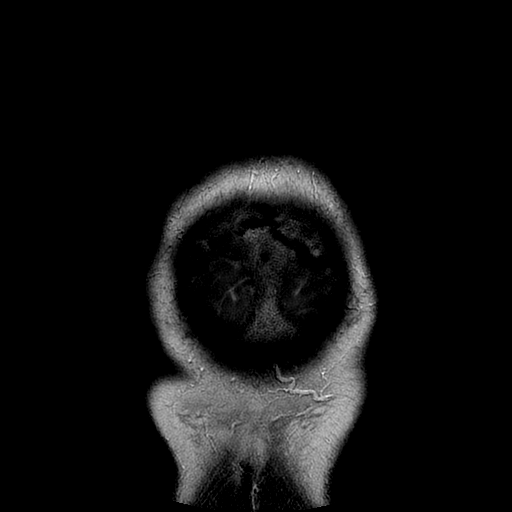
[im 15/29]
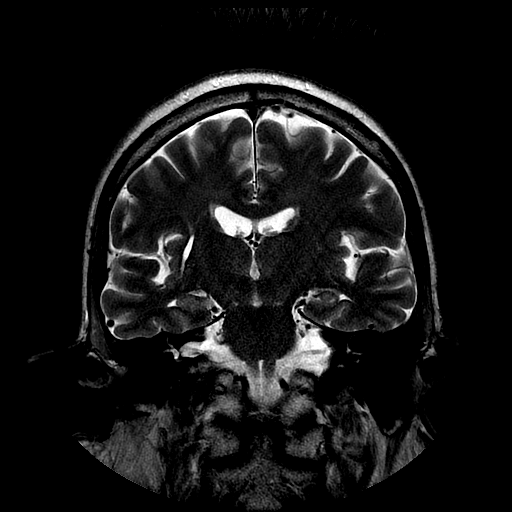
[im 29/29]
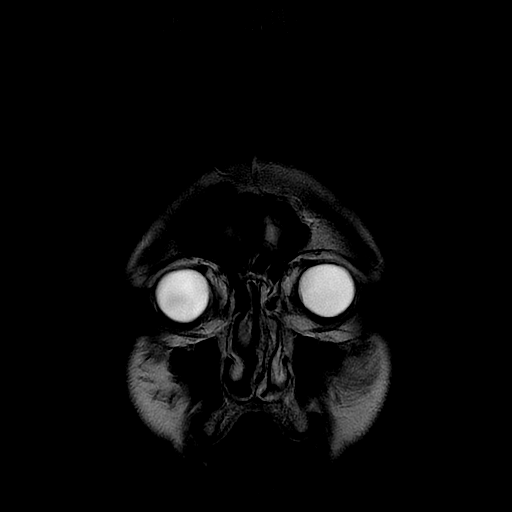

[Series 300: DWI · axial · 3.0mm · 1.09mm/px · z∈[-28,+104]mm · 4 of 46 slices shown (3 of 4)]
[im 1/46]
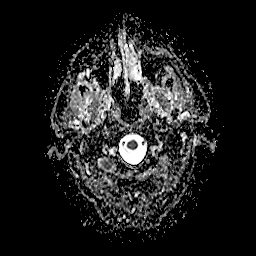
[im 16/46]
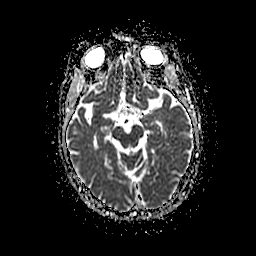
[im 31/46]
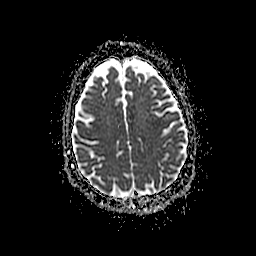
[im 46/46]
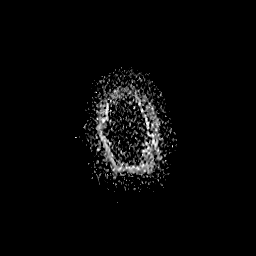

[Series 400: DWI · coronal · 5.0mm · 1.09mm/px · 3 of 34 slices shown (4 of 4)]
[im 1/34]
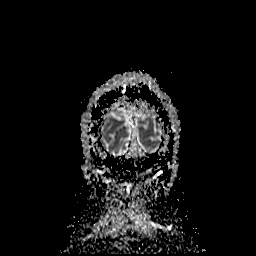
[im 17/34]
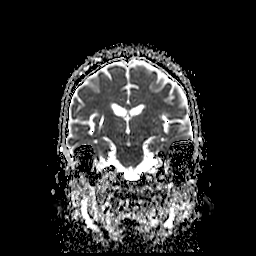
[im 34/34]
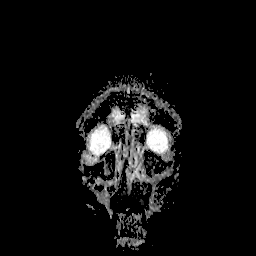

[35 of 48 positions shown; findings below may reference images not displayed]

FINDINGS: Brain: Linear area of encephalomalacia in the right external capsule
with associated hemosiderin, suggestive of prior hemorrhagic
infarct. Mild surrounding gliosis. No evidence of acute infarct,
acute hemorrhage, mass lesion, midline shift, or extra-axial fluid
collection.

Vascular: Major arterial flow voids are maintained at the skull
base.

Skull and upper cervical spine: Normal marrow signal.

Sinuses/Orbits: Clear sinuses. Unremarkable orbits.

Other: No mastoid effusions.
IMPRESSION: 1. No evidence of acute intracranial abnormality.
2. Evidence of prior hemorrhagic infarct in the right external
capsule, new since [3S] MRI.

## 2021-08-29 MED ORDER — POTASSIUM CHLORIDE CRYS ER 20 MEQ PO TBCR
60.0000 meq | EXTENDED_RELEASE_TABLET | Freq: Once | ORAL | Status: AC
  Administered 2021-08-29: 60 meq via ORAL
  Filled 2021-08-29: qty 3

## 2021-08-29 MED ORDER — POTASSIUM CHLORIDE 10 MEQ/100ML IV SOLN
10.0000 meq | INTRAVENOUS | Status: AC
  Administered 2021-08-29 (×6): 10 meq via INTRAVENOUS
  Filled 2021-08-29 (×6): qty 100

## 2021-08-29 MED ORDER — MAGNESIUM SULFATE 4 GM/100ML IV SOLN
4.0000 g | Freq: Once | INTRAVENOUS | Status: AC
  Administered 2021-08-29: 4 g via INTRAVENOUS
  Filled 2021-08-29: qty 100

## 2021-08-29 MED ORDER — ORAL CARE MOUTH RINSE
15.0000 mL | Freq: Two times a day (BID) | OROMUCOSAL | Status: DC
  Administered 2021-08-29 – 2021-08-30 (×2): 15 mL via OROMUCOSAL

## 2021-08-29 NOTE — Consult Note (Signed)
[place] Nurse ostomy consult note Stoma type/location: RUQ ileotomy.  Bedside RN has just completed pouch change due to over-filling and leakage. Patient reports that he has had an ostomy for 20 years. He is independent in his care. Stomal assessment/size: 1 and 1/4 inch stoma, pink, slightly raised, moist  Peristomal assessment: Not seen today, reported by patient and RN to be clear Treatment options for stomal/peristomal skin: none indicated. I will leave a skin barrier ring at the bedside for next pouch change. Output: yellow effluent. Ostomy pouching: 2pc. 2 and 3/4 inch pouching system.  He could perhaps wear the next small size, but he is comfortable with this system, has many others at home in this size and is not looking to change. Education provided: None required. Enrolled patient in [place] program: No. Patient is established with a supplier.   Pouching system is a 2 and 3/4 inch, Kellie Simmering #s below: Pouch is Kellie Simmering # [phone] Skin barrier is Kellie Simmering #2  Skin barrier rings [place] # [phone]  I have left a spare pouching system at the bedside: pouch, skin barrier and skin barrier ring, also a stoma sizing guide.  [place] nursing team will not follow, but will remain available to this patient, the nursing and medical teams.  Please re-consult if needed. Thanks, Maudie Flakes, MSN, RN, [place], Arther Abbott  Pager# [phone]

## 2021-08-29 NOTE — Evaluation (Signed)
Physical Therapy Evaluation Patient Details Name: Eddie Taylor MRN: [id number] DOB: 16-Mar-1956 Today's Date: 08/29/2021  History of Present Illness  66 y.o. male presents to [facility] hospital on 08/28/2021 with AMS. Pt found unresponsive by spouse after drinking multiple beers. Of note pt's Depakote dosage was recently increased. All imaging negative. PMH includes bipolar disorder, seizure disorder, anxiety/depression, alcohol abuse, HTN, HLD, RA on DMARDS and narcotics.  Clinical Impression  Pt presents to PT with mild deficits in gait, balance, strength, however does not appear to be far from his recent baseline. Pt reports a history of generalized weakness over the past few months along with unexplained weight loss. Pt is able to ambulate with UE support of RW at this time, reporting instability compared to baseline, but does not require physical assistance. Pt will benefit from further gait/balance training to aide in a return to independence.       Recommendations for follow up therapy are one component of a multi-disciplinary discharge planning process, led by the attending physician.  Recommendations may be updated based on patient status, additional functional criteria and insurance authorization.  Follow Up Recommendations No PT follow up    Assistance Recommended at Discharge PRN  Patient can return home with the following  A little help with walking and/or transfers;Help with stairs or ramp for entrance    Equipment Recommendations None recommended by PT  Recommendations for Other Services       Functional Status Assessment Patient has had a recent decline in their functional status and demonstrates the ability to make significant improvements in function in a reasonable and predictable amount of time.     Precautions / Restrictions Precautions Precautions: Fall Restrictions Weight Bearing Restrictions: No      Mobility  Bed Mobility Overal bed mobility: Modified Independent                   Transfers Overall transfer level: Independent                      Ambulation/Gait Ambulation/Gait assistance: Supervision Gait Distance (Feet): 150 Feet Assistive device: None, Rolling walker (2 wheels) (no device 20', RW 130') Gait Pattern/deviations: Step-through pattern Gait velocity: functional Gait velocity interpretation: 1.31 - 2.62 ft/sec, indicative of limited community ambulator   General Gait Details: pt with steady step-through gait with use of RW, one mild LOB initially when without UE support but is otherwise able to maintain balance  Stairs            Wheelchair Mobility    Modified Rankin (Stroke Patients Only)       Balance Overall balance assessment: Needs assistance Sitting-balance support: No upper extremity supported, Feet supported Sitting balance-Leahy Scale: Good     Standing balance support: No upper extremity supported, During functional activity Standing balance-Leahy Scale: Good                               Pertinent Vitals/Pain Pain Assessment Pain Assessment: No/denies pain    Home Living Family/patient expects to be discharged to:: Private residence Living Arrangements: Spouse/significant other Available Help at Discharge: Family;Available 24 hours/day Type of Home: House Home Access: Stairs to enter Entrance Stairs-Rails: Left Entrance Stairs-Number of Steps: 3   Home Layout: One level Home Equipment: Conservation officer, nature (2 wheels);Cane - single point;Shower seat      Prior Function Prior Level of Function : Independent/Modified Independent  Mobility Comments: pt reports utilizing a cane PRN due to recent weakness associated with unexplained weight loss       Hand Dominance        Extremity/Trunk Assessment   Upper Extremity Assessment Upper Extremity Assessment: Overall WFL for tasks assessed    Lower Extremity Assessment Lower Extremity Assessment:  Generalized weakness    Cervical / Trunk Assessment Cervical / Trunk Assessment: Normal  Communication   Communication: No difficulties  Cognition Arousal/Alertness: Awake/alert Behavior During Therapy: WFL for tasks assessed/performed Overall Cognitive Status: Within Functional Limits for tasks assessed                                          General Comments General comments (skin integrity, edema, etc.): VSS on RA    Exercises     Assessment/Plan    PT Assessment Patient needs continued PT services  PT Problem List Decreased balance;Decreased activity tolerance;Decreased strength;Decreased mobility       PT Treatment Interventions DME instruction;Gait training;Stair training;Functional mobility training;Therapeutic activities;Therapeutic exercise;Balance training;Neuromuscular re-education;Patient/family education    PT Goals (Current goals can be found in the Care Plan section)  Acute Rehab PT Goals Patient Stated Goal: to return to independence PT Goal Formulation: With patient/family Time For Goal Achievement: 09/12/21 Potential to Achieve Goals: Good Additional Goals Additional Goal #1: Pt will score >19/24 on DGI to indicate a reduced risk for falls    Frequency Min 3X/week     Co-evaluation               AM-PAC PT "6 Clicks" Mobility  Outcome Measure Help needed turning from your back to your side while in a flat bed without using bedrails?: None Help needed moving from lying on your back to sitting on the side of a flat bed without using bedrails?: None Help needed moving to and from a bed to a chair (including a wheelchair)?: None Help needed standing up from a chair using your arms (e.g., wheelchair or bedside chair)?: None Help needed to walk in hospital room?: A Little Help needed climbing 3-5 steps with a railing? : A Little 6 Click Score: 22    End of Session   Activity Tolerance: Patient tolerated treatment well Patient  left: in bed;with call bell/phone within reach Nurse Communication: Mobility status PT Visit Diagnosis: Other abnormalities of gait and mobility (R26.89);Unsteadiness on feet (R26.81)    Time: 8676-7209 PT Time Calculation (min) (ACUTE ONLY): 19 min   Charges:   PT Evaluation $PT Eval Low Complexity: [place], PT, DPT Acute Rehabilitation Pager: [phone] Office [phone]   Zenaida Niece 08/29/2021, 4:01 PM

## 2021-08-29 NOTE — TOC Initial Note (Signed)
Transition of Care [facility]) - Initial/Assessment Note    Patient Details  Name: Eddie Taylor MRN: [id number] Date of Birth: 01/20/1956  Transition of Care [facility] CM/SW Contact:    Verdell Carmine, RN Phone Number: 08/29/2021, 5:54 PM  Clinical Narrative:                  The Transition of Care Department [facility]) has reviewed patient and no TOC needs have been identified at this time. We will continue to monitor patient advancement through interdisciplinary progression rounds. If new patient transition needs arise, please place a TOC consult        Patient Goals and CMS Choice        Expected Discharge Plan and Services                                                Prior Living Arrangements/Services                       Activities of Daily Living      Permission Sought/Granted                  Emotional Assessment              Admission diagnosis:  Altered mental status [R41.82] Altered mental status, unspecified altered mental status type [R41.82] AMS (altered mental status) [R41.82] Patient Active Problem List   Diagnosis Date Noted   Severe sepsis ([place]) with hypotension, AKI secondary to enteritis (intractible nausea/vomiting, abdominal pain), metabolic acidosis 82/50/5397   Bigeminy 08/22/2020   Demand ischemia ([place]) 08/22/2020   Aortic atherosclerosis ([place]) 08/22/2020   Hyperkalemia 08/22/2020   Tremor 08/22/2020   Hyponatremia 08/22/2020   Acute urinary retention 08/22/2020   Essential hypertension 08/22/2020   Tobacco abuse 08/22/2020   Ileostomy in place [facility]) 08/22/2020   Parastomal hernia 08/22/2020   Rheumatoid arthritis ([place]) on chronic immunosuppressives 08/22/2020   Hypotension 08/19/2020   Hypomagnesemia 08/19/2020   Hypocalcemia 08/19/2020   Intractable nausea and vomiting 08/19/2020   Insomnia 08/19/2020   High anion gap metabolic acidosis 67/34/1937   AKI (acute kidney injury) ([place]) 08/18/2020   Dehydration     AMS (altered mental status) 02/15/2019   Right shoulder pain 02/15/2019   Pancytopenia ([place]) 02/15/2019   Alcohol dependence ([place]) 02/15/2019   H pylori ulcer 03/14/2013   Abdominal pain, unspecified site 03/13/2013   Peptic ulcer disease 03/13/2013   Cholelithiases 03/13/2013   PCP:  [name], MD Pharmacy:   [facility], [address] [place] [address] Phone: [phone] Fax: [phone]     Social Determinants of Health (SDOH) Interventions    Readmission Risk Interventions No flowsheet data found.

## 2021-08-29 NOTE — Progress Notes (Signed)
EEG complete - results pending 

## 2021-08-29 NOTE — ED Notes (Signed)
Pt's linens changed and colostomy emptyed

## 2021-08-29 NOTE — Progress Notes (Signed)
Patient unavailable for EEG.  Will re-attempt when he returns from MRI.

## 2021-08-29 NOTE — Progress Notes (Addendum)
PROGRESS NOTE  Eddie Taylor  KDX:[id number] DOB: 30-Apr-1956 DOA: 08/28/2021 PCP: [name], MD   Brief Narrative: Patient is a 66 year old male with history of bipolar disorder, seizure disorder, anxiety/depression, chronic alcohol abuse, hypertension, hyperlipidemia, rheumatoid arthritis who presents from home to the emergency department with complaints of confusion.  Patient was noted to be lethargic, confused at home.  As per the report, he has history of chronic regular alcohol intake.  Patient was admitted for the work-up of acute altered mental status.  Assessment & Plan:  Principal Problem:   AMS (altered mental status)  Altered mental status: Multifactorial.  Most likely metabolic encephalopathy from Depakote overdose versus binge drinking versus polypharmacy.  MRI of the brain did not show any acute intracranial abnormalities.  CT angiogram of the head and neck did not show any large vessel occlusion. UDS was positive for opiates.  UA was not suspicious for UTI. Ammonia level was found to be 180 on presentation, ammonia level normal today.  Likely associated with Depakote use. Currently he is alert and oriented. EEG pending.  Severe hypomagnesemia/hypokalemia: Being supplemented and monitored  Elevated ammonia level:  ammonia level normal today.  Was given lactulose  Lactic acidosis: Given IV fluids.  Monitor the level  Chronic alcohol abuse: Started on CIWA protocol.  Had been drinking a day before admission.  Counseled for cessation.  Also started on high-dose thiamine.  History of bipolar disorder: On Depakote to 250 mg at bedtime  History of rheumatoid arthritis: We recommend to follow-up with rheumatology as an outpatient  Hypertension: Currently blood pressure stable.  Continue current medication  Hyperlipidemia: Continue  Deconditioning/debility: PT consulted        DVT prophylaxis:enoxaparin (LOVENOX) injection 40 mg Start: 08/28/21 1815     Code Status:  Full Code  Family Communication: Wife on phone today   Patient status: Observation  Anticipated discharge to: Likely home tomorrow.  Needs PT evaluation   Consultants: None   Procedures: None  Antimicrobials:  Anti-infectives (From admission, onward)    None       Subjective: Patient seen and examined at the bedside this morning.  During my evaluation, he was hemodynamically stable, alert and oriented.  Denies any new complaints today.  Objective: Vitals:   08/29/21 0815 08/29/21 1000 08/29/21 1134 08/29/21 1424  BP: (!) 144/93 (!) 162/91 (!) 170/91 (!) 146/96  Pulse: 69 65 70 72  Resp: 20 19 17  (!) 21  Temp:      TempSrc:      SpO2: 100% 100% 99% 99%    Intake/Output Summary (Last 24 hours) at 08/29/2021 1435 Last data filed at 08/28/2021 2015 Gross per 24 hour  Intake --  Output 450 ml  Net -450 ml   There were no vitals filed for this visit.  Examination:  General exam: Overall comfortable, not in distress, chronically ill looking HEENT: PERRL Respiratory system:  no wheezes or crackles  Cardiovascular system: S1 & S2 heard, RRR.  Gastrointestinal system: Abdomen is nondistended, soft and nontender.  Stoma on the right lower quadrant Central nervous system: Alert and oriented Extremities: No edema, no clubbing ,no cyanosis Skin: No rashes, no ulcers,no icterus     Data Reviewed: I have personally reviewed following labs and imaging studies  CBC: Recent Labs  Lab 08/28/21 1449 08/28/21 1456 08/28/21 1953 08/29/21 0702  WBC 3.4*  --   --  5.0  NEUTROABS 1.7  --   --   --   HGB 13.6 13.6 11.2*  11.7*  HCT 37.9* 40.0 33.0* 32.4*  MCV 103.3*  --   --  105.2*  PLT 122*  --   --  90*   Basic Metabolic Panel: Recent Labs  Lab 08/28/21 1449 08/28/21 1456 08/28/21 1943 08/28/21 1953 08/29/21 0702  NA 137 136  --  137 138  K 4.3 4.3  --  3.6 2.9*  CL 98 100  --   --  105  CO2 23  --   --   --  22  GLUCOSE 94 90  --   --  91  BUN 5* 4*  --   --   5*  CREATININE 0.84 0.70  --   --  0.74  CALCIUM 9.0  --   --   --  8.4*  MG  --   --  0.8*  --   --      Recent Results (from the past 240 hour(s))  Resp Panel by RT-PCR (Flu A&B, Covid) Nasopharyngeal Swab     Status: None   Collection Time: 08/28/21  2:37 PM   Specimen: Nasopharyngeal Swab; Nasopharyngeal(NP) swabs in vial transport medium  Result Value Ref Range Status   SARS Coronavirus 2 by RT PCR NEGATIVE NEGATIVE Final    Comment: (NOTE) SARS-CoV-2 target nucleic acids are NOT DETECTED.  The SARS-CoV-2 RNA is generally detectable in upper respiratory specimens during the acute phase of infection. The lowest concentration of SARS-CoV-2 viral copies this assay can detect is 138 copies/mL. A negative result does not preclude SARS-Cov-2 infection and should not be used as the sole basis for treatment or other patient management decisions. A negative result may occur with  improper specimen collection/handling, submission of specimen other than nasopharyngeal swab, presence of viral mutation(s) within the areas targeted by this assay, and inadequate number of viral copies(<138 copies/mL). A negative result must be combined with clinical observations, patient history, and epidemiological information. The expected result is Negative.  Fact Sheet for Patients:  EntrepreneurPulse.com.au  Fact Sheet for Healthcare Providers:  IncredibleEmployment.be  This test is no t yet approved or cleared by the [place] FDA and  has been authorized for detection and/or diagnosis of SARS-CoV-2 by FDA under an Emergency Use Authorization (EUA). This EUA will remain  in effect (meaning this test can be used) for the duration of the COVID-19 declaration under Section 564(b)(1) of the Act, 21 U.S.C.section 360bbb-3(b)(1), unless the authorization is terminated  or revoked sooner.       Influenza A by PCR NEGATIVE NEGATIVE Final   Influenza B by PCR  NEGATIVE NEGATIVE Final    Comment: (NOTE) The Xpert Xpress SARS-CoV-2/FLU/RSV plus assay is intended as an aid in the diagnosis of influenza from Nasopharyngeal swab specimens and should not be used as a sole basis for treatment. Nasal washings and aspirates are unacceptable for Xpert Xpress SARS-CoV-2/FLU/RSV testing.  Fact Sheet for Patients: EntrepreneurPulse.com.au  Fact Sheet for Healthcare Providers: IncredibleEmployment.be  This test is not yet approved or cleared by the [place] FDA and has been authorized for detection and/or diagnosis of SARS-CoV-2 by FDA under an Emergency Use Authorization (EUA). This EUA will remain in effect (meaning this test can be used) for the duration of the COVID-19 declaration under Section 564(b)(1) of the Act, 21 U.S.C. section 360bbb-3(b)(1), unless the authorization is terminated or revoked.  Performed at [facility], [place] [address], [place], [place] [address]      Radiology Studies: MR BRAIN WO CONTRAST  Result Date:  08/29/2021 CLINICAL DATA:  Delirium EXAM: MRI HEAD WITHOUT CONTRAST TECHNIQUE: Multiplanar, multiecho pulse sequences of the brain and surrounding structures were obtained without intravenous contrast. COMPARISON:  CT February 2023.  MRI February 15, 2019. FINDINGS: Brain: Linear area of encephalomalacia in the right external capsule with associated hemosiderin, suggestive of prior hemorrhagic infarct. Mild surrounding gliosis. No evidence of acute infarct, acute hemorrhage, mass lesion, midline shift, or extra-axial fluid collection. Vascular: Major arterial flow voids are maintained at the skull base. Skull and upper cervical spine: Normal marrow signal. Sinuses/Orbits: Clear sinuses. Unremarkable orbits. Other: No mastoid effusions. IMPRESSION: 1. No evidence of acute intracranial abnormality. 2. Evidence of prior hemorrhagic infarct in the right external capsule, new since 2020 MRI.  Electronically Signed   By: Margaretha Sheffield M.D.   On: 08/29/2021 11:08   DG Chest Port 1 View  Result Date: 08/28/2021 CLINICAL DATA:  Altered mental status. EXAM: PORTABLE CHEST 1 VIEW COMPARISON:  08/18/2020 FINDINGS: Heart size and pulmonary vascularity are normal. Emphysematous changes are suggested. Scattered calcified granulomas. No airspace disease or consolidation in the lungs. No pleural effusions. No pneumothorax. Mediastinal contours appear intact. Degenerative changes in the spine and shoulders. IMPRESSION: No active disease. Electronically Signed   By: Lucienne Capers M.D.   On: 08/28/2021 21:23   CT HEAD CODE STROKE WO CONTRAST  Result Date: 08/28/2021 CLINICAL DATA:  Code stroke. Neuro deficit, acute, stroke suspected. Right-sided weakness EXAM: CT HEAD WITHOUT CONTRAST TECHNIQUE: Contiguous axial images were obtained from the base of the skull through the vertex without intravenous contrast. RADIATION DOSE REDUCTION: This exam was performed according to the departmental dose-optimization program which includes automated exposure control, adjustment of the mA and/or kV according to patient size and/or use of iterative reconstruction technique. COMPARISON:  July 2020 FINDINGS: Brain: No acute intracranial hemorrhage, mass effect, or edema. Gray-white differentiation is preserved. Small chronic infarct of the right caudate. Interval chronic appearing infarct of the right lentiform nucleus and subinsular white matter. Gray-white differentiation is preserved. Ventricles and sulci are normal in size and configuration. Vascular: No hyperdense vessel. Skull: Unremarkable. Sinuses/Orbits: No significant abnormality. Other: Mastoid air cells are clear. ASPECTS ([place] Stroke Program Early CT Score) - Ganglionic level infarction (caudate, lentiform nuclei, internal capsule, insula, M1-M3 cortex): 7 - Supraganglionic infarction (M4-M6 cortex): 3 Total score (0-10 with 10 being normal): 10 IMPRESSION:  There is no acute intracranial hemorrhage or evidence of acute infarction. ASPECT score is 10. Chronic right basal ganglia infarcts. These results were communicated to Dr. Reeves Forth at 3:05 pm on 08/28/2021 by text page via the [facility] messaging system. Electronically Signed   By: Macy Mis M.D.   On: 08/28/2021 15:16   CT ANGIO HEAD NECK W WO CM W PERF (CODE STROKE)  Result Date: 08/28/2021 CLINICAL DATA:  Neuro deficit, acute, stroke suspected EXAM: CT ANGIOGRAPHY HEAD AND NECK CT PERFUSION BRAIN TECHNIQUE: Multidetector CT imaging of the head and neck was performed using the standard protocol during bolus administration of intravenous contrast. Multiplanar CT image reconstructions and MIPs were obtained to evaluate the vascular anatomy. Carotid stenosis measurements (when applicable) are obtained utilizing NASCET criteria, using the distal internal carotid diameter as the denominator. Multiphase CT imaging of the brain was performed following IV bolus contrast injection. Subsequent parametric perfusion maps were calculated using RAPID software. RADIATION DOSE REDUCTION: This exam was performed according to the departmental dose-optimization program which includes automated exposure control, adjustment of the mA and/or kV according to patient size and/or use of iterative  reconstruction technique. CONTRAST:  145mL OMNIPAQUE IOHEXOL 350 MG/ML SOLN COMPARISON:  None. FINDINGS: CTA NECK Aortic arch: Mild calcified plaque along the arch and patent great vessel origins. Right carotid system: Patent. Mixed but primarily calcified plaque along the proximal internal carotid. There is unchanged up to 50% stenosis. Left carotid system: Patent.  Calcified Vertebral arteries: Primarily calcified plaque along the proximal internal carotid. There is unchanged up to 50% stenosis. Skeleton: Degenerative changes of the included spine. Other neck: Unremarkable. Upper chest: Some debris within the trachea.  No apical lung mass.  Review of the MIP images confirms the above findings CTA HEAD Anterior circulation: Intracranial internal carotid arteries are patent with minor calcified plaque. Anterior and middle cerebral arteries are patent. Posterior circulation: Intracranial vertebral arteries are patent. Basilar artery is patent. Posterior cerebral arteries are patent. Venous sinuses: Patent as allowed by contrast bolus timing. Review of the MIP images confirms the above findings CT Brain Perfusion Findings: CBF (<30%) Volume: 15mL Perfusion (Tmax>6.0s) volume: 5mL Mismatch Volume: 68mL Infarction Location: None. IMPRESSION: No large vessel occlusion or hemodynamically significant stenosis. Unchanged plaque at the ICA origins causing up to 50% stenosis. Perfusion imaging demonstrates no evidence of core infarction or penumbra, noting presence of some motion artifact. Electronically Signed   By: Macy Mis M.D.   On: 08/28/2021 15:28    Scheduled Meds:  allopurinol  300 mg Oral QPM   divalproex  250 mg Oral QHS   enoxaparin (LOVENOX) injection  40 mg Subcutaneous A63K   folic acid  1 mg Oral Daily   icosapent Ethyl  2 g Oral BID   lisinopril  10 mg Oral Daily   LORazepam  0-4 mg Intravenous Q6H   Or   LORazepam  0-4 mg Oral Q6H   [START ON 08/31/2021] LORazepam  0-4 mg Intravenous Q12H   Or   [START ON 08/31/2021] LORazepam  0-4 mg Oral Q12H   pantoprazole  40 mg Oral Daily   pravastatin  40 mg Oral QPM   Continuous Infusions:  sodium chloride 75 mL/hr at 08/29/21 1134   potassium chloride 10 mEq (08/29/21 1342)   thiamine injection Stopped (08/29/21 1211)     LOS: 0 days   Shelly Coss, MD [facility] P2/03/2022, 2:35 PM

## 2021-08-29 NOTE — ED Notes (Signed)
Pt to MRI

## 2021-08-29 NOTE — ED Notes (Signed)
Pt self removed colostomy bag due to leak. RN obtained new bag from supply coordinator, applied new 2 piece bag over stoma. Changed pt's gown, linens, and provided hygiene care. Pt resting in bed with warm blankets, call light in reach.

## 2021-08-29 NOTE — Progress Notes (Incomplete)
PROGRESS NOTE  Eddie Taylor  IDP:[id number] DOB: August 31, 1955 DOA: 08/28/2021 PCP: [name], MD   Brief Narrative: No notes on file   Assessment & Plan: Principal Problem:   AMS (altered mental status)   Assessment and Plan:               DVT prophylaxis:enoxaparin (LOVENOX) injection 40 mg Start: 08/28/21 1815     Code Status: Full Code  Family Communication:   Patient status:{Inpatient:23812} {DISCHARGE DESTINATION_TRH:27031}   Consultants:   Procedures:  Antimicrobials:  Anti-infectives (From admission, onward)    None       Subjective:   Objective: Vitals:   08/29/21 0545 08/29/21 0615 08/29/21 0700 08/29/21 0712  BP: 135/89 (!) 145/90 (!) 154/92 (!) 154/92  Pulse: 64 67 69 68  Resp: 19 19 (!) 28   Temp:      TempSrc:      SpO2: 97% 99% 100%     Intake/Output Summary (Last 24 hours) at 08/29/2021 4431 Last data filed at 08/28/2021 2015 Gross per 24 hour  Intake --  Output 450 ml  Net -450 ml   There were no vitals filed for this visit.  Examination:  General exam: Overall comfortable, not in distress HEENT: PERRL Respiratory system:  no wheezes or crackles  Cardiovascular system: S1 & S2 heard, RRR.  Gastrointestinal system: Abdomen is nondistended, soft and nontender. Central nervous system: Alert and oriented Extremities: No edema, no clubbing ,no cyanosis Skin: No rashes, no ulcers,no icterus     Data Reviewed: I have personally reviewed following labs and imaging studies  CBC: Recent Labs  Lab 08/28/21 1449 08/28/21 1456 08/28/21 1953 08/29/21 0702  WBC 3.4*  --   --  5.0  NEUTROABS 1.7  --   --   --   HGB 13.6 13.6 11.2* 11.7*  HCT 37.9* 40.0 33.0* 32.4*  MCV 103.3*  --   --  105.2*  PLT 122*  --   --  PENDING   Basic Metabolic Panel: Recent Labs  Lab 08/28/21 1449 08/28/21 1456 08/28/21 1943 08/28/21 1953 08/29/21 0702  NA 137 136  --  137 138  K 4.3 4.3  --  3.6 2.9*  CL 98 100  --   --  105  CO2 23   --   --   --  22  GLUCOSE 94 90  --   --  91  BUN 5* 4*  --   --  5*  CREATININE 0.84 0.70  --   --  0.74  CALCIUM 9.0  --   --   --  8.4*  MG  --   --  0.8*  --   --      Recent Results (from the past 240 hour(s))  Resp Panel by RT-PCR (Flu A&B, Covid) Nasopharyngeal Swab     Status: None   Collection Time: 08/28/21  2:37 PM   Specimen: Nasopharyngeal Swab; Nasopharyngeal(NP) swabs in vial transport medium  Result Value Ref Range Status   SARS Coronavirus 2 by RT PCR NEGATIVE NEGATIVE Final    Comment: (NOTE) SARS-CoV-2 target nucleic acids are NOT DETECTED.  The SARS-CoV-2 RNA is generally detectable in upper respiratory specimens during the acute phase of infection. The lowest concentration of SARS-CoV-2 viral copies this assay can detect is 138 copies/mL. A negative result does not preclude SARS-Cov-2 infection and should not be used as the sole basis for treatment or other patient management decisions. A negative result may occur with  improper specimen collection/handling, submission of specimen other than nasopharyngeal swab, presence of viral mutation(s) within the areas targeted by this assay, and inadequate number of viral copies(<138 copies/mL). A negative result must be combined with clinical observations, patient history, and epidemiological information. The expected result is Negative.  Fact Sheet for Patients:  EntrepreneurPulse.com.au  Fact Sheet for Healthcare Providers:  IncredibleEmployment.be  This test is no t yet approved or cleared by the [place] FDA and  has been authorized for detection and/or diagnosis of SARS-CoV-2 by FDA under an Emergency Use Authorization (EUA). This EUA will remain  in effect (meaning this test can be used) for the duration of the COVID-19 declaration under Section 564(b)(1) of the Act, 21 U.S.C.section 360bbb-3(b)(1), unless the authorization is terminated  or revoked sooner.        Influenza A by PCR NEGATIVE NEGATIVE Final   Influenza B by PCR NEGATIVE NEGATIVE Final    Comment: (NOTE) The Xpert Xpress SARS-CoV-2/FLU/RSV plus assay is intended as an aid in the diagnosis of influenza from Nasopharyngeal swab specimens and should not be used as a sole basis for treatment. Nasal washings and aspirates are unacceptable for Xpert Xpress SARS-CoV-2/FLU/RSV testing.  Fact Sheet for Patients: EntrepreneurPulse.com.au  Fact Sheet for Healthcare Providers: IncredibleEmployment.be  This test is not yet approved or cleared by the [place] FDA and has been authorized for detection and/or diagnosis of SARS-CoV-2 by FDA under an Emergency Use Authorization (EUA). This EUA will remain in effect (meaning this test can be used) for the duration of the COVID-19 declaration under Section 564(b)(1) of the Act, 21 U.S.C. section 360bbb-3(b)(1), unless the authorization is terminated or revoked.  Performed at [facility], [place] [address], [place], [place] [address]      Radiology Studies: DG Chest Port 1 View  Result Date: 08/28/2021 CLINICAL DATA:  Altered mental status. EXAM: PORTABLE CHEST 1 VIEW COMPARISON:  08/18/2020 FINDINGS: Heart size and pulmonary vascularity are normal. Emphysematous changes are suggested. Scattered calcified granulomas. No airspace disease or consolidation in the lungs. No pleural effusions. No pneumothorax. Mediastinal contours appear intact. Degenerative changes in the spine and shoulders. IMPRESSION: No active disease. Electronically Signed   By: Lucienne Capers M.D.   On: 08/28/2021 21:23   CT HEAD CODE STROKE WO CONTRAST  Result Date: 08/28/2021 CLINICAL DATA:  Code stroke. Neuro deficit, acute, stroke suspected. Right-sided weakness EXAM: CT HEAD WITHOUT CONTRAST TECHNIQUE: Contiguous axial images were obtained from the base of the skull through the vertex without intravenous contrast. RADIATION DOSE  REDUCTION: This exam was performed according to the departmental dose-optimization program which includes automated exposure control, adjustment of the mA and/or kV according to patient size and/or use of iterative reconstruction technique. COMPARISON:  July 2020 FINDINGS: Brain: No acute intracranial hemorrhage, mass effect, or edema. Gray-white differentiation is preserved. Small chronic infarct of the right caudate. Interval chronic appearing infarct of the right lentiform nucleus and subinsular white matter. Gray-white differentiation is preserved. Ventricles and sulci are normal in size and configuration. Vascular: No hyperdense vessel. Skull: Unremarkable. Sinuses/Orbits: No significant abnormality. Other: Mastoid air cells are clear. ASPECTS ([place] Stroke Program Early CT Score) - Ganglionic level infarction (caudate, lentiform nuclei, internal capsule, insula, M1-M3 cortex): 7 - Supraganglionic infarction (M4-M6 cortex): 3 Total score (0-10 with 10 being normal): 10 IMPRESSION: There is no acute intracranial hemorrhage or evidence of acute infarction. ASPECT score is 10. Chronic right basal ganglia infarcts. These results were communicated to Dr. Reeves Forth at 3:05 pm  on 08/28/2021 by text page via the [facility] messaging system. Electronically Signed   By: Macy Mis M.D.   On: 08/28/2021 15:16   CT ANGIO HEAD NECK W WO CM W PERF (CODE STROKE)  Result Date: 08/28/2021 CLINICAL DATA:  Neuro deficit, acute, stroke suspected EXAM: CT ANGIOGRAPHY HEAD AND NECK CT PERFUSION BRAIN TECHNIQUE: Multidetector CT imaging of the head and neck was performed using the standard protocol during bolus administration of intravenous contrast. Multiplanar CT image reconstructions and MIPs were obtained to evaluate the vascular anatomy. Carotid stenosis measurements (when applicable) are obtained utilizing NASCET criteria, using the distal internal carotid diameter as the denominator. Multiphase CT imaging of the brain was  performed following IV bolus contrast injection. Subsequent parametric perfusion maps were calculated using RAPID software. RADIATION DOSE REDUCTION: This exam was performed according to the departmental dose-optimization program which includes automated exposure control, adjustment of the mA and/or kV according to patient size and/or use of iterative reconstruction technique. CONTRAST:  179mL OMNIPAQUE IOHEXOL 350 MG/ML SOLN COMPARISON:  None. FINDINGS: CTA NECK Aortic arch: Mild calcified plaque along the arch and patent great vessel origins. Right carotid system: Patent. Mixed but primarily calcified plaque along the proximal internal carotid. There is unchanged up to 50% stenosis. Left carotid system: Patent.  Calcified Vertebral arteries: Primarily calcified plaque along the proximal internal carotid. There is unchanged up to 50% stenosis. Skeleton: Degenerative changes of the included spine. Other neck: Unremarkable. Upper chest: Some debris within the trachea.  No apical lung mass. Review of the MIP images confirms the above findings CTA HEAD Anterior circulation: Intracranial internal carotid arteries are patent with minor calcified plaque. Anterior and middle cerebral arteries are patent. Posterior circulation: Intracranial vertebral arteries are patent. Basilar artery is patent. Posterior cerebral arteries are patent. Venous sinuses: Patent as allowed by contrast bolus timing. Review of the MIP images confirms the above findings CT Brain Perfusion Findings: CBF (<30%) Volume: 48mL Perfusion (Tmax>6.0s) volume: 58mL Mismatch Volume: 75mL Infarction Location: None. IMPRESSION: No large vessel occlusion or hemodynamically significant stenosis. Unchanged plaque at the ICA origins causing up to 50% stenosis. Perfusion imaging demonstrates no evidence of core infarction or penumbra, noting presence of some motion artifact. Electronically Signed   By: Macy Mis M.D.   On: 08/28/2021 15:28    Scheduled Meds:   allopurinol  300 mg Oral QPM   divalproex  250 mg Oral QHS   enoxaparin (LOVENOX) injection  40 mg Subcutaneous E52D   folic acid  1 mg Oral Daily   icosapent Ethyl  2 g Oral BID   lactulose  30 g Oral BID   lisinopril  10 mg Oral Daily   LORazepam  0-4 mg Intravenous Q6H   Or   LORazepam  0-4 mg Oral Q6H   [START ON 08/31/2021] LORazepam  0-4 mg Intravenous Q12H   Or   [START ON 08/31/2021] LORazepam  0-4 mg Oral Q12H   pantoprazole  40 mg Oral Daily   pravastatin  40 mg Oral QPM   Continuous Infusions:  sodium chloride 150 mL/hr at 08/28/21 1942   potassium chloride     thiamine injection Stopped (08/28/21 1747)     LOS: 0 days   Shelly Coss, MD [facility] P2/03/2022, 8:23 AM

## 2021-08-29 NOTE — ED Notes (Signed)
Patient returned from MRI.

## 2021-08-29 NOTE — Procedures (Signed)
Telespecialist EEG Report   Routine EEG  Date: 08/29/21  Duration 22:30  Clinical Indication: altered mental status, suspected seizures  EEG Procedure:  This is a digitally recorded routine electroencephalogram. The international 10-20 electrode placement system is used for scalp electrode placement. Eighteen channels of scalp EEG are recorded. The data are stored digitally and reviewed in reformatted montages for optimal display.   EEG Description: This EEG is well organized.  There is a well formed, well sustained and symmetrical 7 hz posterior dominant rhythm that attenuated with eye opening and drowsiness.  The background consists of theta, alpha and beta frequencies.  Diffuse fast activity was present.  During drowsiness, background slowing increased.  Stage 2 sleep was not recorded.  There were no focal abnormalities, persistent asymmetries or epileptiform discharges.  Activating procedures:  Photic stimulation was not performed.  Hyperventilation was not performed.   EKG:  The EKG rhythm strip demonstrated a NSR at 72 bpm.  EEG Classification:  Abnormal Background slowing, generalized Diffuse fast activity  EEG Interpretation: This routine EEG recorded in the awake, and drowsy states is abnormal.  The background slowing is suggestive of mild diffuse cerebral dysfunction.  The diffuse fast activity is most likely a medication effect (ex. Benzodiazepine, barbiturates and/or levetiracetam).  There were no focal abnormalities, persistent asymmetries or epileptiform discharges.

## 2021-08-29 NOTE — ED Notes (Signed)
Tech at bedside for EEG.

## 2021-08-29 NOTE — Progress Notes (Addendum)
Pt noted to have ST Elevation in V-lead according to bedside cardiac monitor. DR. Myna Hidalgo notified. No c/o chest pain. New order noted.

## 2021-08-30 DIAGNOSIS — R4182 Altered mental status, unspecified: Secondary | ICD-10-CM | POA: Diagnosis not present

## 2021-08-30 LAB — BASIC METABOLIC PANEL
Anion gap: 9 (ref 5–15)
BUN: 5 mg/dL — ABNORMAL LOW (ref 8–23)
CO2: 22 mmol/L (ref 22–32)
Calcium: 8.1 mg/dL — ABNORMAL LOW (ref 8.9–10.3)
Chloride: 102 mmol/L (ref 98–111)
Creatinine, Ser: 0.76 mg/dL (ref 0.61–1.24)
GFR, Estimated: >60 mL/min (ref >60)
Glucose, Bld: 180 mg/dL — ABNORMAL HIGH (ref 70–99)
Potassium: 3.5 mmol/L (ref 3.5–5.1)
Sodium: 133 mmol/L — ABNORMAL LOW (ref 135–145)

## 2021-08-30 LAB — CBC WITH DIFFERENTIAL/PLATELET
Abs Immature Granulocytes: 0.02 10*3/uL (ref 0.00–0.07)
Basophils Absolute: 0 10*3/uL (ref 0.0–0.1)
Basophils Relative: 1 %
Eosinophils Absolute: 0.2 10*3/uL (ref 0.0–0.5)
Eosinophils Relative: 4 %
HCT: 29.6 % — ABNORMAL LOW (ref 39.0–52.0)
Hemoglobin: 10.7 g/dL — ABNORMAL LOW (ref 13.0–17.0)
Immature Granulocytes: 0 %
Lymphocytes Relative: 38 %
Lymphs Abs: 2 10*3/uL (ref 0.7–4.0)
MCH: 37.5 pg — ABNORMAL HIGH (ref 26.0–34.0)
MCHC: 36.1 g/dL — ABNORMAL HIGH (ref 30.0–36.0)
MCV: 103.9 fL — ABNORMAL HIGH (ref 80.0–100.0)
Monocytes Absolute: 0.3 10*3/uL (ref 0.1–1.0)
Monocytes Relative: 6 %
Neutro Abs: 2.7 10*3/uL (ref 1.7–7.7)
Neutrophils Relative %: 51 %
Platelets: 80 10*3/uL — ABNORMAL LOW (ref 150–400)
RBC: 2.85 MIL/uL — ABNORMAL LOW (ref 4.22–5.81)
RDW: 12.2 % (ref 11.5–15.5)
WBC: 5.3 10*3/uL (ref 4.0–10.5)
nRBC: 0 % (ref 0.0–0.2)

## 2021-08-30 LAB — MAGNESIUM: Magnesium: 1.4 mg/dL — ABNORMAL LOW (ref 1.7–2.4)

## 2021-08-30 LAB — LACTIC ACID, PLASMA: Lactic Acid, Venous: 2.6 mmol/L (ref 0.5–1.9)

## 2021-08-30 MED ORDER — POTASSIUM CHLORIDE CRYS ER 20 MEQ PO TBCR: 20.0000 meq | EXTENDED_RELEASE_TABLET | Freq: Two times a day (BID) | ORAL | 0 refills | Status: AC

## 2021-08-30 MED ORDER — THIAMINE HCL 100 MG PO TABS: 100.0000 mg | ORAL_TABLET | Freq: Every day | ORAL | 1 refills | Status: AC

## 2021-08-30 MED ORDER — DIVALPROEX SODIUM ER 250 MG PO TB24: 250.0000 mg | ORAL_TABLET | Freq: Every day | ORAL | Status: DC

## 2021-08-30 MED ORDER — MAGNESIUM SULFATE 2 GM/50ML IV SOLN
2.0000 g | Freq: Once | INTRAVENOUS | Status: AC
  Administered 2021-08-30: 2 g via INTRAVENOUS
  Filled 2021-08-30: qty 50

## 2021-08-30 MED ORDER — POTASSIUM CHLORIDE CRYS ER 20 MEQ PO TBCR
40.0000 meq | EXTENDED_RELEASE_TABLET | Freq: Once | ORAL | Status: AC
Start: 2021-08-30 — End: 2021-08-30
  Administered 2021-08-30: 40 meq via ORAL
  Filled 2021-08-30: qty 2

## 2021-08-30 MED ORDER — MAGNESIUM OXIDE 400 MG PO CAPS: 800.0000 mg | ORAL_CAPSULE | Freq: Every day | ORAL | 1 refills | Status: AC

## 2021-08-30 MED ORDER — ENSURE ENLIVE PO LIQD: 237.0000 mL | Freq: Three times a day (TID) | ORAL | Status: DC

## 2021-08-30 NOTE — Discharge Instructions (Signed)
[facility] Stay Proper nutrition can help your body recover from illness and injury.   Foods and beverages high in protein, vitamins, and minerals help rebuild muscle loss, promote healing, & reduce fall risk.   In addition to eating healthy foods, a nutrition shake is an easy, delicious way to get the nutrition you need during and after your hospital stay  It is recommended that you continue to drink at least 3 bottles per day of: Ensure for at least 1 month (30 days) after your hospital stay   Tips for adding a nutrition shake into your routine: As allowed, drink one with vitamins or medications instead of water or juice Enjoy one as a tasty mid-morning or afternoon snack Drink cold or make a milkshake out of it Drink one instead of milk with cereal or snacks Use as a coffee creamer   Available at the following grocery stores and pharmacies:           * [place] [phone]            For COUPONS visit: www.ensure.com/join or http://dawson-may.com/   Suggested Substitutions Ensure Plus = Boost Plus = Carnation Breakfast Essentials = Boost Compact Ensure Active Clear = Boost Breeze Glucerna Shake = Boost Glucose Control = Carnation Breakfast Essentials SUGAR FREE  Suggestions For Increasing Calories And Protein Several small meals a day are easier to eat and digest than three large ones. Space meals about 2 to 3 hours apart to maximize comfort. Stop eating 2 to 3 hours before bed and sleep with your head elevated if gastric reflux (GERD) and heartburn are problems. Do not eat your favorite foods if you are feeling bad. Save them for when you feel good! Eat breakfast-type foods at any meal. Eggs are usually easy to eat and are great any time of the day. (The same goes for pancakes and waffles.) Eat  when you feel hungry. Most people have the greatest appetite in the morning because they have not eaten all night. If this is the best meal for you, then pile on those calories and other nutrients in the morning and at lunch. Then you can have a smaller dinner without losing total calories for the day. Eat leftovers or nutritious snacks in the afternoon and early evening to round out your day. Try homemade or commercially prepared nutrition bars and puddings, as well as calorie- and protein-rich liquid nutritional supplements. Benefits of Physical Activity Talk to your doctor about physical activity. Light or moderate physical activity can help maintain muscle and promote an appetite. Walking in the neighborhood or the local mall is a great way to get up, get out, and get moving. If you are unsteady on your feet, try walking around the dining room table. Save Room for [employer]! Drink most fluids between meals instead of with meals. (It is fine to have a sip to help swallow food at meal time.) Fluids (which usually have fewer calories and nutrients than solid food) can take up valuable space in your stomach.  Foods Recommended High-Protein Foods Milk products Add cheese to toast, crackers, sandwiches, baked potatoes, vegetables, soups, noodles, meat, and fruit. Use reduced-fat (2%) or whole milk in place  of water when cooking cereal and cream soups. Include cream sauces on vegetables and pasta. Add powdered milk to cream soups and mashed potatoes.  Eggs Have hard-cooked eggs readily available in the refrigerator. Chop and add to salads, casseroles, soups, and vegetables. Make a quick egg salad. All eggs should be well cooked to avoid the risk of harmful bacteria.  Meats, poultry, and fish Add leftover cooked meats to soups, casseroles, salads, and omelets. Make dip by mixing diced, chopped, or shredded meat with sour cream and spices.  Beans, legumes, nuts, and seeds Sprinkle nuts and seeds on  cereals, fruit, and desserts such as ice cream, pudding, and custard. Also serve nuts and seeds on vegetables, salads, and pasta. Spread peanut butter on toast, bread, English muffins, and fruit, or blend it in a milk shake. Add beans and peas to salads, soups, casseroles, and vegetable dishes.  High-Calorie Foods Butter, margarine, and  oils Melt butter or margarine over potatoes, rice, pasta, and cooked vegetables. Add melted butter or margarine into soups and casseroles and spread on bread for sandwiches before spreading sandwich spread or peanut butter. Saut or stir-fry vegetables, meats, chicken and fish such as shrimp/scallops in olive or canola oil. A variety of oils add calories and can be used to [employer], chicken, or fish.  Milk products Add whipping cream to desserts, pancakes, waffles, fruit, and hot chocolate, and fold it into soups and casseroles. Add sour cream to baked potatoes and vegetables.  Salad dressing Use regular (not low-fat or diet) mayonnaise and salad dressing on sandwiches and in dips with vegetables and fruit.   Sweets Add jelly and honey to bread and crackers. Add jam to fruit and ice cream and as a topping over cake.   Copyright 2020  [employer]y [employer]. All rights reserved.

## 2021-08-30 NOTE — Progress Notes (Signed)
Initial Nutrition Assessment  DOCUMENTATION CODES:  Severe malnutrition in context of chronic illness  INTERVENTION:  Add Ensure Plus High Protein po TID, each supplement provides 350 kcal and 20 grams of protein.   Continue CIWA protocol.  Encourage PO and supplement intake.  Obtain updated weight.  NUTRITION DIAGNOSIS:  Severe Malnutrition related to chronic illness (EtOH abuse) as evidenced by severe fat depletion, severe muscle depletion, energy intake < or equal to 75% for > or equal to 1 month.  GOAL:  Patient will meet greater than or equal to 90% of their needs  MONITOR:  PO intake, Supplement acceptance, Labs, Weight trends, Skin, I & O's  REASON FOR ASSESSMENT:  Malnutrition Screening Tool    ASSESSMENT:  66 yo male with a PMH of bipolar disorder, seizure disorder, anxiety/depression, chronic EtOH abuse, HTN, HLD, and rheumatoid arthritis who presents with confusion. Admitted for altered mental status.  Spoke with pt and wife at bedside. Pt and wife frustrated with MD recommendations regarding pt ceasing to drink EtOH given his labs.  Pt reports that he has been vomiting everything he has been eating since 05/2021, at least once daily.   Wife reports making two meals daily - breakfast (French toast, pancakes, eggs, bacon, etc) and dinner (meat, two vegetables, and bread). Pt snacks on cheese or peanut butter crackers during the day.  Pt reports drinking 2-3 cans of beer daily. Used to drink 18 beers until about one year ago.  He reports a weight loss of 30 lbs in the past two months.  Pt with no admission weight. RD to order.  Recommended cessation of EtOH and to drink at least 3 Ensures daily.  Medications: reviewed; Depakote, folic acid, Ativan, Protonix, Mag-Sulfate per IV, thiamine 500 mg in NS, Norco PO PRN (given once today)  Labs: reviewed; Na 133 (L), Glucose 180 (H), Mag 1.4 (L)  NUTRITION - FOCUSED PHYSICAL EXAM: Flowsheet Row Most Recent Value   Orbital Region Severe depletion  Upper Arm Region Moderate depletion  Thoracic and Lumbar Region Mild depletion  Buccal Region Severe depletion  Temple Region Severe depletion  Clavicle Bone Region Severe depletion  Clavicle and Acromion Bone Region Severe depletion  Scapular Bone Region Severe depletion  Dorsal Hand Moderate depletion  Patellar Region Severe depletion  Anterior Thigh Region Severe depletion  Posterior Calf Region Severe depletion  Edema (RD Assessment) None  Hair Reviewed  Eyes Reviewed  Mouth Reviewed  Skin Reviewed  Nails Reviewed   Diet Order:   Diet Order             Diet - low sodium heart healthy           Diet Heart Room service appropriate? Yes; Fluid consistency: Thin  Diet effective now                  EDUCATION NEEDS:  Education needs have been addressed  Skin:  Skin Assessment: Reviewed RN Assessment  Last BM:  08/29/21 - ostomy  Height:  Ht Readings from Last 1 Encounters:  07/11/21 6' (1.829 m)   Weight:  Wt Readings from Last 1 Encounters:  07/11/21 77.4 kg   BMI:  There is no height or weight on file to calculate BMI.  Estimated Nutritional Needs:  Kcal:  2100-2300 Protein:  95-110 grams Fluid:  >2.1 L  Eddie Taylor, RD, LDN (she/her/hers) Clinical Inpatient Dietitian RD Pager/After-Hours/Weekend Pager # in [place]

## 2021-08-30 NOTE — Care Management Obs Status (Signed)
[place] NOTIFICATION   Patient Details  Name: Eddie Taylor MRN: [id number] Date of Birth: 03/17/1956   Medicare Observation Status Notification Given:  Yes    Benard Halsted, LCSW 08/30/2021, 10:29 AM

## 2021-08-30 NOTE — Discharge Summary (Signed)
Physician Discharge Summary  Eddie Taylor:[id number] DOB: 03-Dec-1955 DOA: 08/28/2021  PCP: [name], MD  Admit date: 08/28/2021 Discharge date: 08/30/2021  Admitted From: Home Disposition:  Home  Discharge Condition:Stable CODE STATUS:FULL Diet recommendation: Heart Healthy   Brief/Interim Summary:  Patient is a 66 year old male with history of bipolar disorder, seizure disorder, anxiety/depression, chronic alcohol abuse, hypertension, hyperlipidemia, rheumatoid arthritis who presents from home to the emergency department with complaints of confusion.  Patient was noted to be lethargic, confused at home.  As per the report, he has history of chronic regular alcohol intake.  Patient was admitted for the work-up of acute altered mental status.  On admission, his ammonia level was found to be high which resolved with lactulose.  The most likely reason for high ammonia level was high dose of Depakote that he was taking at home.  MRI of the brain did not show any acute intracranial abnormality.  EEG did not show any seizure.  PT recommended no follow-up.  Medically stable for discharge home today.  Following problems were addressed during his hospitalization:    Altered mental status: Multifactorial.  Most likely metabolic encephalopathy from Depakote overdose versus binge drinking versus polypharmacy.  MRI of the brain did not show any acute intracranial abnormalities.  CT angiogram of the head and neck did not show any large vessel occlusion. UDS was positive for opiates.  UA was not suspicious for UTI. Ammonia level was found to be 180 on presentation, ammonia level normalised  Likely associated with Depakote use. Currently he is alert and oriented.  MRI of the brain did not show any acute intracranial abnormality.  EEG did not show any seizure.   Severe hypomagnesemia/hypokalemia: Supplemented ,continue on dc   Elevated ammonia level: Ammonia level normalized.  He was taking Depakote  1000 mg daily as recommended by his psychiatrist.  We decreased the dose to 250 mg daily.  We recommend to follow-up with his psychiatrist as soon as possible.    Lactic acidosis: Given IV fluids. Stable.  No suspicion for infectious etiology   Chronic alcohol abuse: Has history of chronic alcohol abuse.  Counseled for cessation.  Continue thiamine, folic acid.    History of bipolar disorder: Continue Depakote to 50 mg daily at bedtime.  Follow-up with psychiatry.  He was taking 1000 mg at home   History of rheumatoid arthritis: We recommend to follow-up with rheumatology as an outpatient   Hypertension: Currently blood pressure stable.  Continue current medication   Hyperlipidemia: Continue  Thrombocytopenia: Most likely secondary to chronic alcohol abuse.  Stable, monitor as an outpatient  Macrocytic anemia: This is also secondary to chronic alcohol abuse.  Hemoglobin currently stable   Deconditioning/debility: PT consulted, no follow-up recommended      Discharge Diagnoses:  Principal Problem:   AMS (altered mental status)    Discharge Instructions  Discharge Instructions     Diet - low sodium heart healthy   Complete by: As directed    Discharge instructions   Complete by: As directed    1)Please take prescribed medications as instructed 2)Follow up with your PCP in a week.  Follow-up with a psychiatrist. 3)Please quit alcohol   Increase activity slowly   Complete by: As directed       Allergies as of 08/30/2021       Reactions   Bupropion Hives   Morphine And Related    "i go beside myself, i dont know. Its not pretty."    Prozac [fluoxetine  Hcl] Itching   Sulfa Antibiotics Other (See Comments)   Doesn't feel well when taking         Medication List     STOP taking these medications    ALPRAZolam 0.5 MG tablet Commonly known as: Xanax   pantoprazole 40 MG tablet Commonly known as: PROTONIX       TAKE these medications    Adalimumab 40  MG/0.8ML Pskt Inject 40 mg into the skin once a week.   allopurinol 300 MG tablet Commonly known as: ZYLOPRIM Take 300 mg by mouth every evening.   divalproex 250 MG 24 hr tablet Commonly known as: DEPAKOTE ER Take 1 tablet (250 mg total) by mouth at bedtime. What changed:  medication strength how much to take   esomeprazole 40 MG capsule Commonly known as: NEXIUM Take 40 mg by mouth 2 (two) times daily before a meal.   folic acid 1 MG tablet Commonly known as: FOLVITE Take 1 tablet (1 mg total) by mouth daily.   HYDROcodone-acetaminophen 10-325 MG tablet Commonly known as: NORCO Take 1 tablet by mouth every 6 (six) hours as needed for moderate pain or severe pain.   lisinopril 10 MG tablet Commonly known as: ZESTRIL Take 10 mg by mouth daily.   Magnesium Oxide 400 MG Caps Take 2 capsules (800 mg total) by mouth daily.   potassium chloride SA 20 MEQ tablet Commonly known as: KLOR-CON M Take 1 tablet (20 mEq total) by mouth 2 (two) times daily for 3 days. Start taking on: August 31, 2021   pravastatin 40 MG tablet Commonly known as: PRAVACHOL Take 40 mg by mouth every evening.   sucralfate 1 GM/10ML suspension Commonly known as: CARAFATE Take 10 mLs (1 g total) by mouth 4 (four) times daily -  with meals and at bedtime. What changed:  when to take this reasons to take this   tamsulosin 0.4 MG Caps capsule Commonly known as: FLOMAX Take 1 capsule (0.4 mg total) by mouth daily.   thiamine 100 MG tablet Take 1 tablet (100 mg total) by mouth daily.   traZODone 100 MG tablet Commonly known as: DESYREL Take 1-2 tablets (100-200 mg total) by mouth at bedtime as needed. for sleep What changed:  reasons to take this additional instructions   Vascepa 1 g capsule Generic drug: icosapent Ethyl Take 2 g by mouth 2 (two) times daily.   Vitamin D (Ergocalciferol) 1.25 MG (50000 UNIT) Caps capsule Commonly known as: DRISDOL Take 50,000 Units by mouth every 7  (seven) days.        Follow-up Information     [name], MD. Schedule an appointment as soon as possible for a visit in 1 week(s).   Specialty: Internal Medicine Contact information: [place] [address] [phone]                Allergies  Allergen Reactions   Bupropion Hives   Morphine And Related     "i go beside myself, i dont know. Its not pretty."    Prozac [Fluoxetine Hcl] Itching   Sulfa Antibiotics Other (See Comments)    Doesn't feel well when taking     Consultations: None   Procedures/Studies: MR BRAIN WO CONTRAST  Result Date: 08/29/2021 CLINICAL DATA:  Delirium EXAM: MRI HEAD WITHOUT CONTRAST TECHNIQUE: Multiplanar, multiecho pulse sequences of the brain and surrounding structures were obtained without intravenous contrast. COMPARISON:  CT February 2023.  MRI February 15, 2019. FINDINGS: Brain: Linear area  of encephalomalacia in the right external capsule with associated hemosiderin, suggestive of prior hemorrhagic infarct. Mild surrounding gliosis. No evidence of acute infarct, acute hemorrhage, mass lesion, midline shift, or extra-axial fluid collection. Vascular: Major arterial flow voids are maintained at the skull base. Skull and upper cervical spine: Normal marrow signal. Sinuses/Orbits: Clear sinuses. Unremarkable orbits. Other: No mastoid effusions. IMPRESSION: 1. No evidence of acute intracranial abnormality. 2. Evidence of prior hemorrhagic infarct in the right external capsule, new since 2020 MRI. Electronically Signed   By: Margaretha Sheffield M.D.   On: 08/29/2021 11:08   DG Chest Port 1 View  Result Date: 08/28/2021 CLINICAL DATA:  Altered mental status. EXAM: PORTABLE CHEST 1 VIEW COMPARISON:  08/18/2020 FINDINGS: Heart size and pulmonary vascularity are normal. Emphysematous changes are suggested. Scattered calcified granulomas. No airspace disease or consolidation in the lungs. No pleural effusions. No pneumothorax.  Mediastinal contours appear intact. Degenerative changes in the spine and shoulders. IMPRESSION: No active disease. Electronically Signed   By: Lucienne Capers M.D.   On: 08/28/2021 21:23   EEG adult  Result Date: 08/29/2021 Mickie Hillier, MD     08/29/2021  4:26 PM Telespecialist EEG Report  Routine EEG Date: 08/29/21 Duration 22:30 Clinical Indication: altered mental status, suspected seizures EEG Procedure:  This is a digitally recorded routine electroencephalogram. The international 10-20 electrode placement system is used for scalp electrode placement. Eighteen channels of scalp EEG are recorded. The data are stored digitally and reviewed in reformatted montages for optimal display.  EEG Description: This EEG is well organized.  There is a well formed, well sustained and symmetrical 7 hz posterior dominant rhythm that attenuated with eye opening and drowsiness.  The background consists of theta, alpha and beta frequencies.  Diffuse fast activity was present.  During drowsiness, background slowing increased.  Stage 2 sleep was not recorded.  There were no focal abnormalities, persistent asymmetries or epileptiform discharges. Activating procedures:  Photic stimulation was not performed.  Hyperventilation was not performed. EKG:  The EKG rhythm strip demonstrated a NSR at 72 bpm. EEG Classification:  Abnormal Background slowing, generalized Diffuse fast activity EEG Interpretation: This routine EEG recorded in the awake, and drowsy states is abnormal.  The background slowing is suggestive of mild diffuse cerebral dysfunction.  The diffuse fast activity is most likely a medication effect (ex. Benzodiazepine, barbiturates and/or levetiracetam).  There were no focal abnormalities, persistent asymmetries or epileptiform discharges.    CT HEAD CODE STROKE WO CONTRAST  Result Date: 08/28/2021 CLINICAL DATA:  Code stroke. Neuro deficit, acute, stroke suspected. Right-sided weakness EXAM: CT HEAD WITHOUT CONTRAST  TECHNIQUE: Contiguous axial images were obtained from the base of the skull through the vertex without intravenous contrast. RADIATION DOSE REDUCTION: This exam was performed according to the departmental dose-optimization program which includes automated exposure control, adjustment of the mA and/or kV according to patient size and/or use of iterative reconstruction technique. COMPARISON:  July 2020 FINDINGS: Brain: No acute intracranial hemorrhage, mass effect, or edema. Gray-white differentiation is preserved. Small chronic infarct of the right caudate. Interval chronic appearing infarct of the right lentiform nucleus and subinsular white matter. Gray-white differentiation is preserved. Ventricles and sulci are normal in size and configuration. Vascular: No hyperdense vessel. Skull: Unremarkable. Sinuses/Orbits: No significant abnormality. Other: Mastoid air cells are clear. ASPECTS [facility] Stroke Program Early CT Score) - Ganglionic level infarction (caudate, lentiform nuclei, internal capsule, insula, M1-M3 cortex): 7 - Supraganglionic infarction (M4-M6 cortex): 3 Total score (0-10 with 10 being normal): 10  IMPRESSION: There is no acute intracranial hemorrhage or evidence of acute infarction. ASPECT score is 10. Chronic right basal ganglia infarcts. These results were communicated to Dr. Reeves Forth at 3:05 pm on 08/28/2021 by text page via the [facility] messaging system. Electronically Signed   By: Macy Mis M.D.   On: 08/28/2021 15:16   CT ANGIO HEAD NECK W WO CM W PERF (CODE STROKE)  Result Date: 08/28/2021 CLINICAL DATA:  Neuro deficit, acute, stroke suspected EXAM: CT ANGIOGRAPHY HEAD AND NECK CT PERFUSION BRAIN TECHNIQUE: Multidetector CT imaging of the head and neck was performed using the standard protocol during bolus administration of intravenous contrast. Multiplanar CT image reconstructions and MIPs were obtained to evaluate the vascular anatomy. Carotid stenosis measurements (when applicable) are  obtained utilizing NASCET criteria, using the distal internal carotid diameter as the denominator. Multiphase CT imaging of the brain was performed following IV bolus contrast injection. Subsequent parametric perfusion maps were calculated using RAPID software. RADIATION DOSE REDUCTION: This exam was performed according to the departmental dose-optimization program which includes automated exposure control, adjustment of the mA and/or kV according to patient size and/or use of iterative reconstruction technique. CONTRAST:  160mL OMNIPAQUE IOHEXOL 350 MG/ML SOLN COMPARISON:  None. FINDINGS: CTA NECK Aortic arch: Mild calcified plaque along the arch and patent great vessel origins. Right carotid system: Patent. Mixed but primarily calcified plaque along the proximal internal carotid. There is unchanged up to 50% stenosis. Left carotid system: Patent.  Calcified Vertebral arteries: Primarily calcified plaque along the proximal internal carotid. There is unchanged up to 50% stenosis. Skeleton: Degenerative changes of the included spine. Other neck: Unremarkable. Upper chest: Some debris within the trachea.  No apical lung mass. Review of the MIP images confirms the above findings CTA HEAD Anterior circulation: Intracranial internal carotid arteries are patent with minor calcified plaque. Anterior and middle cerebral arteries are patent. Posterior circulation: Intracranial vertebral arteries are patent. Basilar artery is patent. Posterior cerebral arteries are patent. Venous sinuses: Patent as allowed by contrast bolus timing. Review of the MIP images confirms the above findings CT Brain Perfusion Findings: CBF (<30%) Volume: 55mL Perfusion (Tmax>6.0s) volume: 57mL Mismatch Volume: 39mL Infarction Location: None. IMPRESSION: No large vessel occlusion or hemodynamically significant stenosis. Unchanged plaque at the ICA origins causing up to 50% stenosis. Perfusion imaging demonstrates no evidence of core infarction or  penumbra, noting presence of some motion artifact. Electronically Signed   By: Macy Mis M.D.   On: 08/28/2021 15:28      Subjective: Patient seen and examined the bedside this morning.  Hemodynamically stable for discharge today.  Discharge planning discussed with the wife at bedside  Discharge Exam: Vitals:   08/30/21 0500 08/30/21 0734  BP: 131/88 (!) 142/96  Pulse: 92 88  Resp: 18 20  Temp: 97.9 F (36.6 C) 97.9 F (36.6 C)  SpO2: 99% 98%   Vitals:   08/29/21 2312 08/29/21 2348 08/30/21 0500 08/30/21 0734  BP: (!) 153/97 (!) 150/92 131/88 (!) 142/96  Pulse: 79 77 92 88  Resp: 17  18 20   Temp: 98.9 F (37.2 C)  97.9 F (36.6 C) 97.9 F (36.6 C)  TempSrc: Oral  Oral Oral  SpO2: 98%  99% 98%    General: Pt is alert, awake, not in acute distress Cardiovascular: RRR, S1/S2 +, no rubs, no gallops Respiratory: CTA bilaterally, no wheezing, no rhonchi Abdominal: Soft, NT, ND, bowel sounds + Extremities: no edema, no cyanosis    The results of significant diagnostics from  this hospitalization (including imaging, microbiology, ancillary and laboratory) are listed below for reference.     Microbiology: Recent Results (from the past 240 hour(s))  Resp Panel by RT-PCR (Flu A&B, Covid) Nasopharyngeal Swab     Status: None   Collection Time: 08/28/21  2:37 PM   Specimen: Nasopharyngeal Swab; Nasopharyngeal(NP) swabs in vial transport medium  Result Value Ref Range Status   SARS Coronavirus 2 by RT PCR NEGATIVE NEGATIVE Final    Comment: (NOTE) SARS-CoV-2 target nucleic acids are NOT DETECTED.  The SARS-CoV-2 RNA is generally detectable in upper respiratory specimens during the acute phase of infection. The lowest concentration of SARS-CoV-2 viral copies this assay can detect is 138 copies/mL. A negative result does not preclude SARS-Cov-2 infection and should not be used as the sole basis for treatment or other patient management decisions. A negative result may  occur with  improper specimen collection/handling, submission of specimen other than nasopharyngeal swab, presence of viral mutation(s) within the areas targeted by this assay, and inadequate number of viral copies(<138 copies/mL). A negative result must be combined with clinical observations, patient history, and epidemiological information. The expected result is Negative.  Fact Sheet for Patients:  EntrepreneurPulse.com.au  Fact Sheet for Healthcare Providers:  IncredibleEmployment.be  This test is no t yet approved or cleared by the [place] FDA and  has been authorized for detection and/or diagnosis of SARS-CoV-2 by FDA under an Emergency Use Authorization (EUA). This EUA will remain  in effect (meaning this test can be used) for the duration of the COVID-19 declaration under Section 564(b)(1) of the Act, 21 U.S.C.section 360bbb-3(b)(1), unless the authorization is terminated  or revoked sooner.       Influenza A by PCR NEGATIVE NEGATIVE Final   Influenza B by PCR NEGATIVE NEGATIVE Final    Comment: (NOTE) The Xpert Xpress SARS-CoV-2/FLU/RSV plus assay is intended as an aid in the diagnosis of influenza from Nasopharyngeal swab specimens and should not be used as a sole basis for treatment. Nasal washings and aspirates are unacceptable for Xpert Xpress SARS-CoV-2/FLU/RSV testing.  Fact Sheet for Patients: EntrepreneurPulse.com.au  Fact Sheet for Healthcare Providers: IncredibleEmployment.be  This test is not yet approved or cleared by the [place] FDA and has been authorized for detection and/or diagnosis of SARS-CoV-2 by FDA under an Emergency Use Authorization (EUA). This EUA will remain in effect (meaning this test can be used) for the duration of the COVID-19 declaration under Section 564(b)(1) of the Act, 21 U.S.C. section 360bbb-3(b)(1), unless the authorization is terminated  or revoked.  Performed at [facility], [place] [address] [place], [place] [address]      Labs: BNP (last 3 results) No results for input(s): BNP in the last 8760 hours. Basic Metabolic Panel: Recent Labs  Lab 08/28/21 1449 08/28/21 1456 08/28/21 1943 08/28/21 1953 08/29/21 0702 08/29/21 1520 08/30/21 0246  NA 137 136  --  137 138  --  133*  K 4.3 4.3  --  3.6 2.9* 3.1* 3.5  CL 98 100  --   --  105  --  102  CO2 23  --   --   --  22  --  22  GLUCOSE 94 90  --   --  91  --  180*  BUN 5* 4*  --   --  5*  --  5*  CREATININE 0.84 0.70  --   --  0.74  --  0.76  CALCIUM 9.0  --   --   --  8.4*  --  8.1*  MG  --   --  0.8*  --   --  1.9 1.4*   Liver Function Tests: Recent Labs  Lab 08/28/21 1449  AST 54*  ALT 39  ALKPHOS 47  BILITOT 1.0  PROT 7.3  ALBUMIN 3.9   No results for input(s): LIPASE, AMYLASE in the last 168 hours. Recent Labs  Lab 08/28/21 1528 08/29/21 0702  AMMONIA 180* 24   CBC: Recent Labs  Lab 08/28/21 1449 08/28/21 1456 08/28/21 1953 08/29/21 0702 08/30/21 0246  WBC 3.4*  --   --  5.0 5.3  NEUTROABS 1.7  --   --   --  2.7  HGB 13.6 13.6 11.2* 11.7* 10.7*  HCT 37.9* 40.0 33.0* 32.4* 29.6*  MCV 103.3*  --   --  105.2* 103.9*  PLT 122*  --   --  90* 80*   Cardiac Enzymes: No results for input(s): CKTOTAL, CKMB, CKMBINDEX, TROPONINI in the last 168 hours. BNP: Invalid input(s): POCBNP CBG: Recent Labs  Lab 08/28/21 1447  GLUCAP 89   D-Dimer No results for input(s): DDIMER in the last 72 hours. Hgb A1c No results for input(s): HGBA1C in the last 72 hours. Lipid Profile No results for input(s): CHOL, HDL, LDLCALC, TRIG, CHOLHDL, LDLDIRECT in the last 72 hours. Thyroid function studies No results for input(s): TSH, T4TOTAL, T3FREE, THYROIDAB in the last 72 hours.  Invalid input(s): FREET3 Anemia work up No results for input(s): VITAMINB12, FOLATE, FERRITIN, TIBC, IRON, RETICCTPCT in the last 72 hours. Urinalysis     Component Value Date/Time   COLORURINE STRAW (A) 08/29/2021 0746   APPEARANCEUR CLEAR 08/29/2021 0746   LABSPEC 1.008 08/29/2021 0746   PHURINE 7.0 08/29/2021 0746   GLUCOSEU NEGATIVE 08/29/2021 0746   HGBUR SMALL (A) 08/29/2021 0746   BILIRUBINUR NEGATIVE 08/29/2021 0746   KETONESUR NEGATIVE 08/29/2021 0746   PROTEINUR NEGATIVE 08/29/2021 0746   UROBILINOGEN 0.2 03/13/2013 0157   NITRITE NEGATIVE 08/29/2021 0746   LEUKOCYTESUR NEGATIVE 08/29/2021 0746   Sepsis Labs Invalid input(s): PROCALCITONIN,  WBC,  LACTICIDVEN Microbiology Recent Results (from the past 240 hour(s))  Resp Panel by RT-PCR (Flu A&B, Covid) Nasopharyngeal Swab     Status: None   Collection Time: 08/28/21  2:37 PM   Specimen: Nasopharyngeal Swab; Nasopharyngeal(NP) swabs in vial transport medium  Result Value Ref Range Status   SARS Coronavirus 2 by RT PCR NEGATIVE NEGATIVE Final    Comment: (NOTE) SARS-CoV-2 target nucleic acids are NOT DETECTED.  The SARS-CoV-2 RNA is generally detectable in upper respiratory specimens during the acute phase of infection. The lowest concentration of SARS-CoV-2 viral copies this assay can detect is 138 copies/mL. A negative result does not preclude SARS-Cov-2 infection and should not be used as the sole basis for treatment or other patient management decisions. A negative result may occur with  improper specimen collection/handling, submission of specimen other than nasopharyngeal swab, presence of viral mutation(s) within the areas targeted by this assay, and inadequate number of viral copies(<138 copies/mL). A negative result must be combined with clinical observations, patient history, and epidemiological information. The expected result is Negative.  Fact Sheet for Patients:  EntrepreneurPulse.com.au  Fact Sheet for Healthcare Providers:  IncredibleEmployment.be  This test is no t yet approved or cleared by the [place] FDA  and  has been authorized for detection and/or diagnosis of SARS-CoV-2 by FDA under an Emergency Use Authorization (EUA). This EUA will remain  in effect (meaning this test can be  used) for the duration of the COVID-19 declaration under Section 564(b)(1) of the Act, 21 U.S.C.section 360bbb-3(b)(1), unless the authorization is terminated  or revoked sooner.       Influenza A by PCR NEGATIVE NEGATIVE Final   Influenza B by PCR NEGATIVE NEGATIVE Final    Comment: (NOTE) The Xpert Xpress SARS-CoV-2/FLU/RSV plus assay is intended as an aid in the diagnosis of influenza from Nasopharyngeal swab specimens and should not be used as a sole basis for treatment. Nasal washings and aspirates are unacceptable for Xpert Xpress SARS-CoV-2/FLU/RSV testing.  Fact Sheet for Patients: EntrepreneurPulse.com.au  Fact Sheet for Healthcare Providers: IncredibleEmployment.be  This test is not yet approved or cleared by the [place] FDA and has been authorized for detection and/or diagnosis of SARS-CoV-2 by FDA under an Emergency Use Authorization (EUA). This EUA will remain in effect (meaning this test can be used) for the duration of the COVID-19 declaration under Section 564(b)(1) of the Act, 21 U.S.C. section 360bbb-3(b)(1), unless the authorization is terminated or revoked.  Performed at [facility], [place] [address] [place], [place] [address]     Please note: You were cared for by a hospitalist during your hospital stay. Once you are discharged, your primary care physician will handle any further medical issues. Please note that NO REFILLS for any discharge medications will be authorized once you are discharged, as it is imperative that you return to your primary care physician (or establish a relationship with a primary care physician if you do not have one) for your post hospital discharge needs so that they can reassess your need for medications and  monitor your lab values.    Time coordinating discharge: 40 minutes  SIGNED:   Shelly Coss, MD  [facility] 08/30/2021, 10:28 AM Pager [phone]  If 7PM-7AM, please contact night-coverage [email or web address] Password [email or web address]

## 2021-08-30 NOTE — Progress Notes (Signed)
Patient's spouse asked CSW about why Medicaid is not in place anymore for patient. CSW advised her to contact Department of Social Services to ask if patient needs to reapply. She stated understanding.   Gilmore Laroche, MSW, [facility]

## 2021-08-30 NOTE — Progress Notes (Signed)
0500 not collected. Called lead phlebotomist several times.no returned call. Charge nurse notified

## 2021-08-30 NOTE — Progress Notes (Signed)
Nsg Discharge Note  Admit Date:  08/28/2021 Discharge date: 08/30/2021   Eddie Taylor to be D/C'd Home per MD order.  AVS completed. Patient/caregiver able to verbalize understanding.  Discharge Medication: Allergies as of 08/30/2021       Reactions   Bupropion Hives   Morphine And Related    "i go beside myself, i dont know. Its not pretty."    Prozac [fluoxetine Hcl] Itching   Sulfa Antibiotics Other (See Comments)   Doesn't feel well when taking         Medication List     STOP taking these medications    ALPRAZolam 0.5 MG tablet Commonly known as: Xanax   pantoprazole 40 MG tablet Commonly known as: PROTONIX       TAKE these medications    Adalimumab 40 MG/0.8ML Pskt Inject 40 mg into the skin once a week.   allopurinol 300 MG tablet Commonly known as: ZYLOPRIM Take 300 mg by mouth every evening.   divalproex 250 MG 24 hr tablet Commonly known as: DEPAKOTE ER Take 1 tablet (250 mg total) by mouth at bedtime. What changed:  medication strength how much to take   esomeprazole 40 MG capsule Commonly known as: NEXIUM Take 40 mg by mouth 2 (two) times daily before a meal.   folic acid 1 MG tablet Commonly known as: FOLVITE Take 1 tablet (1 mg total) by mouth daily.   HYDROcodone-acetaminophen 10-325 MG tablet Commonly known as: NORCO Take 1 tablet by mouth every 6 (six) hours as needed for moderate pain or severe pain.   lisinopril 10 MG tablet Commonly known as: ZESTRIL Take 10 mg by mouth daily.   Magnesium Oxide 400 MG Caps Take 2 capsules (800 mg total) by mouth daily.   potassium chloride SA 20 MEQ tablet Commonly known as: KLOR-CON M Take 1 tablet (20 mEq total) by mouth 2 (two) times daily for 3 days. Start taking on: August 31, 2021   pravastatin 40 MG tablet Commonly known as: PRAVACHOL Take 40 mg by mouth every evening.   sucralfate 1 GM/10ML suspension Commonly known as: CARAFATE Take 10 mLs (1 g total) by mouth 4 (four) times  daily -  with meals and at bedtime. What changed:  when to take this reasons to take this   tamsulosin 0.4 MG Caps capsule Commonly known as: FLOMAX Take 1 capsule (0.4 mg total) by mouth daily.   thiamine 100 MG tablet Take 1 tablet (100 mg total) by mouth daily.   traZODone 100 MG tablet Commonly known as: DESYREL Take 1-2 tablets (100-200 mg total) by mouth at bedtime as needed. for sleep What changed:  reasons to take this additional instructions   Vascepa 1 g capsule Generic drug: icosapent Ethyl Take 2 g by mouth 2 (two) times daily.   Vitamin D (Ergocalciferol) 1.25 MG (50000 UNIT) Caps capsule Commonly known as: DRISDOL Take 50,000 Units by mouth every 7 (seven) days.        Discharge Assessment: Vitals:   08/30/21 0500 08/30/21 0734  BP: 131/88 (!) 142/96  Pulse: 92 88  Resp: 18 20  Temp: 97.9 F (36.6 C) 97.9 F (36.6 C)  SpO2: 99% 98%   Skin clean, dry and intact without evidence of skin break down, no evidence of skin tears noted. IV catheter discontinued intact. Site without signs and symptoms of complications - no redness or edema noted at insertion site, patient denies c/o pain - only slight tenderness at site.  Dressing with  slight pressure applied.  D/c Instructions-Education: Discharge instructions given to patient/family with verbalized understanding. D/c education completed with patient/family including follow up instructions, medication list, d/c activities limitations if indicated, with other d/c instructions as indicated by MD - patient able to verbalize understanding, all questions fully answered. Patient instructed to return to ED, call 911, or call MD for any changes in condition.  Patient escorted via [place], and D/C home via private auto.  Atilano Ina, RN 08/30/2021 12:10 PM

## 2021-08-30 NOTE — Care Management (Signed)
Called twice in room for obs status and DC planning. Notified RN to assist in making sure phone was working.

## 2021-09-03 ENCOUNTER — Ambulatory Visit: Payer: Medicare HMO | Admitting: Physician Assistant

## 2021-09-03 ENCOUNTER — Encounter: Payer: Self-pay | Admitting: Physician Assistant

## 2021-09-03 ENCOUNTER — Other Ambulatory Visit: Payer: Self-pay

## 2021-09-03 DIAGNOSIS — D696 Thrombocytopenia, unspecified: Secondary | ICD-10-CM | POA: Diagnosis not present

## 2021-09-03 DIAGNOSIS — Z79899 Other long term (current) drug therapy: Secondary | ICD-10-CM | POA: Diagnosis not present

## 2021-09-03 DIAGNOSIS — F17200 Nicotine dependence, unspecified, uncomplicated: Secondary | ICD-10-CM

## 2021-09-03 DIAGNOSIS — R7989 Other specified abnormal findings of blood chemistry: Secondary | ICD-10-CM

## 2021-09-03 DIAGNOSIS — F1010 Alcohol abuse, uncomplicated: Secondary | ICD-10-CM | POA: Diagnosis not present

## 2021-09-03 DIAGNOSIS — F39 Unspecified mood [affective] disorder: Secondary | ICD-10-CM | POA: Diagnosis not present

## 2021-09-03 NOTE — Progress Notes (Signed)
[facility]  Patient ID: IZEA LIVOLSI,  MRN: [id number]  PCP: [name], MD  Date of Evaluation: 09/03/2021 Time spent:40 minutes  Chief Complaint:  Chief Complaint   Anxiety; Depression; Insomnia; Follow-up      HISTORY/CURRENT STATUS: HPI For routine med check.  Hosp from 08/28/21-08/30/21 at [facility] for altered mental status.  Had elevated ammonia level which resolved with lactulose.  MRI of brain showed no acute abnormalities.  EEG did not show seizure.  Diagnoses on discharge was altered mental status probably multifactorial including metabolic encephalopathy from Depakote versus alcohol versus polypharmacy. Also had low K+, Lactic acidosis.  Also had thrombocytopenia.  Macrocytic anemia, hemoglobin stable.  Hyperlipidemia, no change in treatment.  Hypertension no change in treatment.  Rheumatoid arthritis no change in treatment.  Deconditioning and treated with PT in the hospital. Xanax was d/c in hosp.   Depakote was decreased from 1,000 mg to 250 mg on discharge. He decreased to 125mg .  He doesn't want to stop it b/c he's been so much better mentally. Prior to the hospitalization, he didn't have altered mental status and had been on VPA for approx a yr. States he feels much better physically and still good mentally.   Patient denies loss of interest in usual activities and is able to enjoy things, limited right now b/c deconditioning with recent hosp stay.  Denies decreased energy or motivation.  Appetite has not changed.  No extreme sadness, tearfulness, or feelings of hopelessness.  Denies any changes in concentration, making decisions or remembering things. Sleeps ok. Not really anxious. He and wife are getting along better since the hosp.  Denies suicidal or homicidal thoughts.  Patient denies increased energy with decreased need for sleep, no increased talkativeness, no racing thoughts, no impulsivity or risky behaviors, no increased spending, no increased libido, no  grandiosity, no paranoia, and no hallucinations.  Review of Systems  Constitutional:  Positive for malaise/fatigue.  HENT: Negative.    Eyes: Negative.   Respiratory: Negative.    Cardiovascular: Negative.   Gastrointestinal: Negative.   Genitourinary: Negative.   Musculoskeletal: Negative.   Skin: Negative.   Neurological: Negative.   Endo/Heme/Allergies: Negative.   Psychiatric/Behavioral:         See HPI   Individual Medical History/ Review of Systems: Changes? :Yes   see HPI  Past medications for mental health diagnoses include: Wellbutrin, Elavil, Effexor, Ambien, Gabapentin, Xanax, Sonata, Lunesta  Allergies: Bupropion, Morphine and related, Prozac [fluoxetine hcl], and Sulfa antibiotics  Current Medications:  Current Outpatient Medications:    Adalimumab 40 MG/0.8ML PSKT, Inject 40 mg into the skin once a week., Disp: , Rfl:    allopurinol (ZYLOPRIM) 300 MG tablet, Take 300 mg by mouth every evening., Disp: , Rfl:    divalproex (DEPAKOTE ER) 250 MG 24 hr tablet, Take 1 tablet (250 mg total) by mouth at bedtime. (Patient taking differently: Take 125 mg by mouth at bedtime.), Disp: , Rfl:    esomeprazole (NEXIUM) 40 MG capsule, Take 40 mg by mouth 2 (two) times daily before a meal., Disp: , Rfl:    folic acid (FOLVITE) 1 MG tablet, Take 1 tablet (1 mg total) by mouth daily., Disp: 90 tablet, Rfl: 4   HYDROcodone-acetaminophen (NORCO) 10-325 MG tablet, Take 1 tablet by mouth every 6 (six) hours as needed for moderate pain or severe pain., Disp: , Rfl:    lisinopril (ZESTRIL) 10 MG tablet, Take 10 mg by mouth daily., Disp: , Rfl:    Magnesium Oxide  400 MG CAPS, Take 2 capsules (800 mg total) by mouth daily., Disp: 60 capsule, Rfl: 1   potassium chloride SA (KLOR-CON M) 20 MEQ tablet, Take 1 tablet (20 mEq total) by mouth 2 (two) times daily for 3 days., Disp: 6 tablet, Rfl: 0   pravastatin (PRAVACHOL) 40 MG tablet, Take 40 mg by mouth every evening., Disp: , Rfl:    thiamine  100 MG tablet, Take 1 tablet (100 mg total) by mouth daily., Disp: 30 tablet, Rfl: 1   traZODone (DESYREL) 100 MG tablet, Take 1-2 tablets (100-200 mg total) by mouth at bedtime as needed. for sleep (Patient taking differently: Take 100-200 mg by mouth at bedtime as needed for sleep.), Disp: 180 tablet, Rfl: 3   VASCEPA 1 g CAPS, Take 2 g by mouth 2 (two) times daily., Disp: , Rfl:    Vitamin D, Ergocalciferol, (DRISDOL) 1.25 MG (50000 UNIT) CAPS capsule, Take 50,000 Units by mouth every 7 (seven) days., Disp: , Rfl:    sucralfate (CARAFATE) 1 GM/10ML suspension, Take 10 mLs (1 g total) by mouth 4 (four) times daily -  with meals and at bedtime. (Patient not taking: Reported on 09/03/2021), Disp: 420 mL, Rfl: 0   tamsulosin (FLOMAX) 0.4 MG CAPS capsule, Take 1 capsule (0.4 mg total) by mouth daily. (Patient not taking: Reported on 10/09/2020), Disp: 30 capsule, Rfl: 1 Medication Side Effects: none  Family Medical/ Social History: Changes? No   MENTAL HEALTH EXAM:  There were no vitals taken for this visit.There is no height or weight on file to calculate BMI.  General Appearance: Casual and Well Groomed  Eye Contact:  Good  Speech:  Clear and Coherent and Normal Rate  Volume:  Normal  Mood:  Euthymic  Affect:  Congruent  Thought Process:  Goal Directed and Descriptions of Associations: Circumstantial  Orientation:  Full (Time, Place, and Person)  Thought Content: Logical   Suicidal Thoughts:  No  Homicidal Thoughts:  No  Memory:  WNL  Judgement:  Good  Insight:  Good  Psychomotor Activity:  Normal  Concentration:  Concentration: Good and Attention Span: Fair  Recall:  Good  Fund of Knowledge: Good  Language: Good  Assets:  Desire for Improvement  ADL's:  Intact  Cognition: WNL  Prognosis:  Good   Labs drawn 08/08/2021 through his primary care provider Lipid panel cholesterol 162, triglycerides 82, HDL 71, LDL 76 Vitamin D 87.6 CMP glucose 88, BUN and creatinine, calcium, sodium  and potassium all normal.  AST was 90, ALT 64 TSH 2.5 CBC with differential White count 8.4, hemoglobin 12.9, hematocrit 37.1, platelets 139 Testosterone level normal Cortisol normal Depakote level 9 with range of 50-100  Labs from 08/28/21-08/30/21 reviewed. See on chart  DIAGNOSES:    ICD-10-CM   1. Episodic mood disorder (HCC)  F39 CBC with Differential/Platelet    Comprehensive metabolic panel    Valproic acid level    Ammonia    2. Thrombocytopenia (HCC)  D69.6 CBC with Differential/Platelet    3. Encounter for long-term (current) use of medications  Z79.899 CBC with Differential/Platelet    Comprehensive metabolic panel    Valproic acid level    Ammonia    4. Increased ammonia level  R79.89 Ammonia    5. Smoker  F17.200     6. Alcohol abuse  F10.10        Receiving Psychotherapy: Yes With [name]   RECOMMENDATIONS:  PDMP was reviewed.  Last Xanax filled 08/06/2021.  He is on  hydrocodone and buprenorphine, known to me. I provided 40  minutes of face to face time during this encounter, including time spent before and after the visit in records review, medical decision making, counseling pertinent to today's visit, and charting.  Smoking cessation and discontinuing alcohol discussed. Consider Campral but he prefers not to add med now. Naltrexone not an option d/t opiate use. Discussed low platelets. Call if bleeding, easy bruising. Will repeat labs within next week or so.   Continue Depakote ER 250 mg, 1/2 po qhs. Continue trazodone 100 mg, 1-2 nightly as needed sleep. Continue thiamine 100 mg, 1 qd. Cont Vitamin D 50,000 iu weekly. Continue therapy with [name]. Labs ordered as above.  Return in 4 weeks.  Donnal Moat, PA-C

## 2021-09-04 DIAGNOSIS — F418 Other specified anxiety disorders: Secondary | ICD-10-CM | POA: Diagnosis not present

## 2021-09-04 DIAGNOSIS — F1020 Alcohol dependence, uncomplicated: Secondary | ICD-10-CM | POA: Diagnosis not present

## 2021-09-04 DIAGNOSIS — Z79899 Other long term (current) drug therapy: Secondary | ICD-10-CM | POA: Diagnosis not present

## 2021-09-04 DIAGNOSIS — R799 Abnormal finding of blood chemistry, unspecified: Secondary | ICD-10-CM | POA: Diagnosis not present

## 2021-09-05 DIAGNOSIS — Z79899 Other long term (current) drug therapy: Secondary | ICD-10-CM | POA: Diagnosis not present

## 2021-09-09 DIAGNOSIS — M170 Bilateral primary osteoarthritis of knee: Secondary | ICD-10-CM | POA: Diagnosis not present

## 2021-09-10 ENCOUNTER — Encounter: Payer: Self-pay | Admitting: Internal Medicine

## 2021-09-10 NOTE — Progress Notes (Signed)
These were already reviewed, in last note.

## 2021-09-16 ENCOUNTER — Telehealth: Payer: Self-pay | Admitting: Physician Assistant

## 2021-09-16 NOTE — Telephone Encounter (Signed)
Please let him know that I got the lab results from [facility] internal medicine.   From my standpoint, his labs look good.    His platelet count has increased to 129.  That still a little low, range is 150--400, but it is much better than the last time it was drawn, on 08/30/2021, it was 80.  If he should ever have increased bruising, nose or gum bleeding or if he gets cut and has a hard time stopping the bleeding, he should let me know.   He is anemic and should follow the recommendations of his PCP.  Depakote level was 21, with his dose I expect to be low but he is doing well on that dose so will not make any changes.  CMP looked good.  Kidney and liver functions are good, sodium 134 which is good, glucose 106 is 6.2 high which is not significant.  No changes in treatment at this time.Thank you.

## 2021-09-16 NOTE — Telephone Encounter (Signed)
Patient called with results and recommendations. He expressed understanding.

## 2021-09-17 DIAGNOSIS — G894 Chronic pain syndrome: Secondary | ICD-10-CM | POA: Diagnosis not present

## 2021-09-17 DIAGNOSIS — M1990 Unspecified osteoarthritis, unspecified site: Secondary | ICD-10-CM | POA: Diagnosis not present

## 2021-09-17 DIAGNOSIS — E7800 Pure hypercholesterolemia, unspecified: Secondary | ICD-10-CM | POA: Diagnosis not present

## 2021-09-17 DIAGNOSIS — G8929 Other chronic pain: Secondary | ICD-10-CM | POA: Diagnosis not present

## 2021-09-17 DIAGNOSIS — Z79899 Other long term (current) drug therapy: Secondary | ICD-10-CM | POA: Diagnosis not present

## 2021-09-17 DIAGNOSIS — Z933 Colostomy status: Secondary | ICD-10-CM | POA: Diagnosis not present

## 2021-09-17 DIAGNOSIS — M069 Rheumatoid arthritis, unspecified: Secondary | ICD-10-CM | POA: Diagnosis not present

## 2021-09-17 DIAGNOSIS — Z6823 Body mass index (BMI) 23.0-23.9, adult: Secondary | ICD-10-CM | POA: Diagnosis not present

## 2021-09-17 DIAGNOSIS — I10 Essential (primary) hypertension: Secondary | ICD-10-CM | POA: Diagnosis not present

## 2021-09-17 DIAGNOSIS — R030 Elevated blood-pressure reading, without diagnosis of hypertension: Secondary | ICD-10-CM | POA: Diagnosis not present

## 2021-09-17 DIAGNOSIS — F17200 Nicotine dependence, unspecified, uncomplicated: Secondary | ICD-10-CM | POA: Diagnosis not present

## 2021-09-17 DIAGNOSIS — M545 Low back pain, unspecified: Secondary | ICD-10-CM | POA: Diagnosis not present

## 2021-10-03 ENCOUNTER — Other Ambulatory Visit: Payer: Self-pay

## 2021-10-03 ENCOUNTER — Encounter: Payer: Self-pay | Admitting: Physician Assistant

## 2021-10-03 ENCOUNTER — Ambulatory Visit: Payer: Medicare HMO | Admitting: Physician Assistant

## 2021-10-03 DIAGNOSIS — F17200 Nicotine dependence, unspecified, uncomplicated: Secondary | ICD-10-CM | POA: Diagnosis not present

## 2021-10-03 DIAGNOSIS — F39 Unspecified mood [affective] disorder: Secondary | ICD-10-CM

## 2021-10-03 DIAGNOSIS — Z789 Other specified health status: Secondary | ICD-10-CM

## 2021-10-03 DIAGNOSIS — R454 Irritability and anger: Secondary | ICD-10-CM

## 2021-10-03 DIAGNOSIS — G4700 Insomnia, unspecified: Secondary | ICD-10-CM

## 2021-10-03 DIAGNOSIS — D696 Thrombocytopenia, unspecified: Secondary | ICD-10-CM | POA: Diagnosis not present

## 2021-10-03 NOTE — Progress Notes (Signed)
[facility] ? ?Patient ID: Eddie Taylor,  ?MRN: [id number] ? ?PCP: [name], MD ? ?Date of Evaluation: 10/03/2021 ?Time spent:30 minutes ? ?Chief Complaint:  ?Chief Complaint   ?Depression; Anxiety; Insomnia; Follow-up ?  ? ? ? ? ?HISTORY/CURRENT STATUS: ?HPI For routine med check. ? ?Doing really well.  At the last visit he had not long before that been released from the hospital.  States he is feeling good, not having any problems at all.  Energy and motivation are good.  ADLs and personal hygiene are normal.  He takes care the cats and dogs at home.  He is sleeping well, uses the trazodone but it is effective.  Not isolating.  No suicidal or homicidal thoughts. ? ?He and his wife are getting along better since he got out of the hospital.  "She is not nagging like she did."  States he does not get irritable as easily either.  Not having a lot of anxiety.  Not using Xanax at all anymore.  That was discontinued.  He does still drink 7-10 beers per week and is smoking 1 cigarette sometimes 2/day. ? ?Patient denies increased energy with decreased need for sleep, no increased talkativeness, no racing thoughts, no impulsivity or risky behaviors, no increased spending, no increased libido, no grandiosity, no increased irritability or anger, and no hallucinations. ? ?He had repeat labs done for me following up the thrombocytopenia from the hospital.  See results below.  Has had no bleeding or easy bruising. ? ?Denies dizziness, syncope, seizures, numbness, tingling, tremor, tics, unsteady gait, slurred speech, confusion. Denies muscle or joint pain, stiffness, or dystonia. ? ?Individual Medical History/ Review of Systems: Changes? :Yes   right knee pain, will be seeing ortho in a few weeks. ? ?Past medications for mental health diagnoses include: ?Wellbutrin, Elavil, Effexor, Ambien, Gabapentin, Xanax, Sonata, Lunesta ? ?Allergies: Bupropion, Morphine and related, Prozac [fluoxetine hcl], and Sulfa  antibiotics ? ?Current Medications:  ?Current Outpatient Medications:  ?  Adalimumab 40 MG/0.8ML PSKT, Inject 40 mg into the skin once a week., Disp: , Rfl:  ?  allopurinol (ZYLOPRIM) 300 MG tablet, Take 300 mg by mouth every evening., Disp: , Rfl:  ?  divalproex (DEPAKOTE ER) 250 MG 24 hr tablet, Take 1 tablet (250 mg total) by mouth at bedtime. (Patient taking differently: Take 125 mg by mouth at bedtime.), Disp: , Rfl:  ?  esomeprazole (NEXIUM) 40 MG capsule, Take 40 mg by mouth 2 (two) times daily before a meal., Disp: , Rfl:  ?  folic acid (FOLVITE) 1 MG tablet, Take 1 tablet (1 mg total) by mouth daily., Disp: 90 tablet, Rfl: 4 ?  HYDROcodone-acetaminophen (NORCO) 10-325 MG tablet, Take 1 tablet by mouth every 6 (six) hours as needed for moderate pain or severe pain., Disp: , Rfl:  ?  lisinopril (ZESTRIL) 10 MG tablet, Take 10 mg by mouth daily., Disp: , Rfl:  ?  Magnesium Oxide 400 MG CAPS, Take 2 capsules (800 mg total) by mouth daily., Disp: 60 capsule, Rfl: 1 ?  pravastatin (PRAVACHOL) 40 MG tablet, Take 40 mg by mouth every evening., Disp: , Rfl:  ?  thiamine 100 MG tablet, Take 1 tablet (100 mg total) by mouth daily., Disp: 30 tablet, Rfl: 1 ?  traZODone (DESYREL) 100 MG tablet, Take 1-2 tablets (100-200 mg total) by mouth at bedtime as needed. for sleep (Patient taking differently: Take 100-200 mg by mouth at bedtime as needed for sleep.), Disp: 180 tablet, Rfl: 3 ?  VASCEPA 1 g CAPS, Take 2 g by mouth 2 (two) times daily., Disp: , Rfl:  ?  potassium chloride SA (KLOR-CON M) 20 MEQ tablet, Take 1 tablet (20 mEq total) by mouth 2 (two) times daily for 3 days., Disp: 6 tablet, Rfl: 0 ?  sucralfate (CARAFATE) 1 GM/10ML suspension, Take 10 mLs (1 g total) by mouth 4 (four) times daily -  with meals and at bedtime. (Patient not taking: Reported on 09/03/2021), Disp: 420 mL, Rfl: 0 ?  tamsulosin (FLOMAX) 0.4 MG CAPS capsule, Take 1 capsule (0.4 mg total) by mouth daily. (Patient not taking: Reported on  10/09/2020), Disp: 30 capsule, Rfl: 1 ?  Vitamin D, Ergocalciferol, (DRISDOL) 1.25 MG (50000 UNIT) CAPS capsule, Take 50,000 Units by mouth every 7 (seven) days. (Patient not taking: Reported on 10/03/2021), Disp: , Rfl:  ?Medication Side Effects: none ? ?Family Medical/ Social History: Changes? No ? ? ?MENTAL HEALTH EXAM: ? ?There were no vitals taken for this visit.There is no height or weight on file to calculate BMI.  ?General Appearance: Casual and Well Groomed  ?Eye Contact:  Good  ?Speech:  Clear and Coherent and Normal Rate  ?Volume:  Normal  ?Mood:  Euthymic  ?Affect:  Congruent  ?Thought Process:  Goal Directed and Descriptions of Associations: Circumstantial  ?Orientation:  Full (Time, Place, and Person)  ?Thought Content: Logical   ?Suicidal Thoughts:  No  ?Homicidal Thoughts:  No  ?Memory:  WNL  ?Judgement:  Good  ?Insight:  Good  ?Psychomotor Activity:  Normal  ?Concentration:  Concentration: Good and Attention Span: Good  ?Recall:  Good  ?Fund of Knowledge: Good  ?Language: Good  ?Assets:  Desire for Improvement  ?ADL's:  Intact  ?Cognition: WNL  ?Prognosis:  Good  ? ? ?Labs received from [place] medicine drawn 09/05/2021 ?Depakote level 21 ?Glucose 106, BUN 10, creatinine 0.69, sodium 134, potassium 4.6, calcium 9.6, alk phos 49, AST 34, ALT 33 ?WBC 4.8, hemoglobin 11.6, hematocrit 33.8, platelets 129 ? ? ? ?DIAGNOSES:  ?  ICD-10-CM   ?1. Episodic mood disorder ([place])  F39   ?  ?2. Irritability and anger  R45.4   ?  ?3. Alcohol use  Z78.9   ?  ?4. Smoker  F17.200   ?  ?5. Insomnia, unspecified type  G47.00   ?  ?6. Thrombocytopenia ([place])  D69.6   ?  ? ? ?Receiving Psychotherapy: Yes With [name] ? ? ?RECOMMENDATIONS:  ?PDMP was reviewed.  Last Xanax filled 08/06/2021.  He is on hydrocodone and buprenorphine, known to me. ?I provided 30 minutes of face to face time during this encounter, including time spent before and after the visit in records review, medical decision making,  counseling pertinent to today's visit, and charting.  ?I am glad to see him doing well! ?His platelet count went up so no reason to follow up with that right now.  He knows to contact me or his PCP if he starts having easy bruising or bleeding. ?Even though he is on an extremely low dose of Depakote, it seems to be helping mood, decrease his anger and irritability so we will continue that dose. ?He has not been taking thiamine so I recommend he either get a thiamine only supplement or B complex.  Stressed the importance of that. ?  ?Continue Depakote ER 250 mg, 1/2 po qhs. ?Continue trazodone 100 mg, 1-2 nightly as needed sleep. ?Cont Vitamin D 1,000 iu, bid ?Continue therapy with [name]. ?Return in  3 months. ? ?Donnal Moat, PA-C  ?

## 2021-10-14 DIAGNOSIS — K219 Gastro-esophageal reflux disease without esophagitis: Secondary | ICD-10-CM | POA: Diagnosis not present

## 2021-10-17 DIAGNOSIS — Z6823 Body mass index (BMI) 23.0-23.9, adult: Secondary | ICD-10-CM | POA: Diagnosis not present

## 2021-10-17 DIAGNOSIS — R030 Elevated blood-pressure reading, without diagnosis of hypertension: Secondary | ICD-10-CM | POA: Diagnosis not present

## 2021-10-17 DIAGNOSIS — G894 Chronic pain syndrome: Secondary | ICD-10-CM | POA: Diagnosis not present

## 2021-10-17 DIAGNOSIS — Z79899 Other long term (current) drug therapy: Secondary | ICD-10-CM | POA: Diagnosis not present

## 2021-10-17 DIAGNOSIS — M1990 Unspecified osteoarthritis, unspecified site: Secondary | ICD-10-CM | POA: Diagnosis not present

## 2021-10-17 DIAGNOSIS — F17200 Nicotine dependence, unspecified, uncomplicated: Secondary | ICD-10-CM | POA: Diagnosis not present

## 2021-10-17 DIAGNOSIS — G8929 Other chronic pain: Secondary | ICD-10-CM | POA: Diagnosis not present

## 2021-10-17 DIAGNOSIS — M545 Low back pain, unspecified: Secondary | ICD-10-CM | POA: Diagnosis not present

## 2021-10-21 DIAGNOSIS — M170 Bilateral primary osteoarthritis of knee: Secondary | ICD-10-CM | POA: Diagnosis not present

## 2021-11-12 DIAGNOSIS — Z79899 Other long term (current) drug therapy: Secondary | ICD-10-CM | POA: Diagnosis not present

## 2021-11-12 DIAGNOSIS — G894 Chronic pain syndrome: Secondary | ICD-10-CM | POA: Diagnosis not present

## 2021-11-12 DIAGNOSIS — N400 Enlarged prostate without lower urinary tract symptoms: Secondary | ICD-10-CM | POA: Diagnosis not present

## 2021-11-12 DIAGNOSIS — R7989 Other specified abnormal findings of blood chemistry: Secondary | ICD-10-CM | POA: Diagnosis not present

## 2021-11-12 DIAGNOSIS — Z6823 Body mass index (BMI) 23.0-23.9, adult: Secondary | ICD-10-CM | POA: Diagnosis not present

## 2021-11-12 DIAGNOSIS — E782 Mixed hyperlipidemia: Secondary | ICD-10-CM | POA: Diagnosis not present

## 2021-11-12 DIAGNOSIS — Z933 Colostomy status: Secondary | ICD-10-CM | POA: Diagnosis not present

## 2021-11-12 DIAGNOSIS — E559 Vitamin D deficiency, unspecified: Secondary | ICD-10-CM | POA: Diagnosis not present

## 2021-11-12 DIAGNOSIS — M1990 Unspecified osteoarthritis, unspecified site: Secondary | ICD-10-CM | POA: Diagnosis not present

## 2021-11-12 DIAGNOSIS — I10 Essential (primary) hypertension: Secondary | ICD-10-CM | POA: Diagnosis not present

## 2021-11-18 DIAGNOSIS — G8929 Other chronic pain: Secondary | ICD-10-CM | POA: Diagnosis not present

## 2021-11-18 DIAGNOSIS — R030 Elevated blood-pressure reading, without diagnosis of hypertension: Secondary | ICD-10-CM | POA: Diagnosis not present

## 2021-11-18 DIAGNOSIS — F17200 Nicotine dependence, unspecified, uncomplicated: Secondary | ICD-10-CM | POA: Diagnosis not present

## 2021-11-18 DIAGNOSIS — Z6823 Body mass index (BMI) 23.0-23.9, adult: Secondary | ICD-10-CM | POA: Diagnosis not present

## 2021-11-18 DIAGNOSIS — Z79899 Other long term (current) drug therapy: Secondary | ICD-10-CM | POA: Diagnosis not present

## 2021-11-18 DIAGNOSIS — M545 Low back pain, unspecified: Secondary | ICD-10-CM | POA: Diagnosis not present

## 2021-11-25 DIAGNOSIS — E785 Hyperlipidemia, unspecified: Secondary | ICD-10-CM | POA: Diagnosis not present

## 2021-11-25 DIAGNOSIS — Z01818 Encounter for other preprocedural examination: Secondary | ICD-10-CM | POA: Diagnosis not present

## 2021-11-25 DIAGNOSIS — I4819 Other persistent atrial fibrillation: Secondary | ICD-10-CM | POA: Diagnosis not present

## 2021-11-25 DIAGNOSIS — I10 Essential (primary) hypertension: Secondary | ICD-10-CM | POA: Diagnosis not present

## 2021-11-28 DIAGNOSIS — M1711 Unilateral primary osteoarthritis, right knee: Secondary | ICD-10-CM | POA: Diagnosis not present

## 2021-11-28 DIAGNOSIS — M1991 Primary osteoarthritis, unspecified site: Secondary | ICD-10-CM | POA: Diagnosis not present

## 2021-11-28 DIAGNOSIS — R5383 Other fatigue: Secondary | ICD-10-CM | POA: Diagnosis not present

## 2021-11-28 DIAGNOSIS — M0579 Rheumatoid arthritis with rheumatoid factor of multiple sites without organ or systems involvement: Secondary | ICD-10-CM | POA: Diagnosis not present

## 2021-11-28 DIAGNOSIS — Z01812 Encounter for preprocedural laboratory examination: Secondary | ICD-10-CM | POA: Diagnosis not present

## 2021-11-28 DIAGNOSIS — Z6823 Body mass index (BMI) 23.0-23.9, adult: Secondary | ICD-10-CM | POA: Diagnosis not present

## 2021-11-28 DIAGNOSIS — Z79899 Other long term (current) drug therapy: Secondary | ICD-10-CM | POA: Diagnosis not present

## 2021-11-28 DIAGNOSIS — R7989 Other specified abnormal findings of blood chemistry: Secondary | ICD-10-CM | POA: Diagnosis not present

## 2021-11-28 DIAGNOSIS — M1009 Idiopathic gout, multiple sites: Secondary | ICD-10-CM | POA: Diagnosis not present

## 2021-12-11 DIAGNOSIS — M1711 Unilateral primary osteoarthritis, right knee: Secondary | ICD-10-CM | POA: Diagnosis not present

## 2021-12-11 DIAGNOSIS — Z79899 Other long term (current) drug therapy: Secondary | ICD-10-CM | POA: Diagnosis not present

## 2021-12-12 DIAGNOSIS — Z79899 Other long term (current) drug therapy: Secondary | ICD-10-CM | POA: Diagnosis not present

## 2021-12-12 DIAGNOSIS — M1711 Unilateral primary osteoarthritis, right knee: Secondary | ICD-10-CM | POA: Diagnosis not present

## 2021-12-19 DIAGNOSIS — F17200 Nicotine dependence, unspecified, uncomplicated: Secondary | ICD-10-CM | POA: Diagnosis not present

## 2021-12-19 DIAGNOSIS — Z79899 Other long term (current) drug therapy: Secondary | ICD-10-CM | POA: Diagnosis not present

## 2021-12-19 DIAGNOSIS — R030 Elevated blood-pressure reading, without diagnosis of hypertension: Secondary | ICD-10-CM | POA: Diagnosis not present

## 2021-12-19 DIAGNOSIS — M545 Low back pain, unspecified: Secondary | ICD-10-CM | POA: Diagnosis not present

## 2021-12-19 DIAGNOSIS — Z6823 Body mass index (BMI) 23.0-23.9, adult: Secondary | ICD-10-CM | POA: Diagnosis not present

## 2021-12-23 ENCOUNTER — Encounter: Payer: Self-pay | Admitting: Physician Assistant

## 2021-12-23 ENCOUNTER — Ambulatory Visit (INDEPENDENT_AMBULATORY_CARE_PROVIDER_SITE_OTHER): Payer: Medicare PPO | Admitting: Physician Assistant

## 2021-12-23 DIAGNOSIS — F1011 Alcohol abuse, in remission: Secondary | ICD-10-CM

## 2021-12-23 DIAGNOSIS — Z87891 Personal history of nicotine dependence: Secondary | ICD-10-CM

## 2021-12-23 DIAGNOSIS — R454 Irritability and anger: Secondary | ICD-10-CM

## 2021-12-23 DIAGNOSIS — F418 Other specified anxiety disorders: Secondary | ICD-10-CM

## 2021-12-23 DIAGNOSIS — D696 Thrombocytopenia, unspecified: Secondary | ICD-10-CM

## 2021-12-23 NOTE — Progress Notes (Signed)
[facility]  Patient ID: Eddie Taylor,  MRN: [id number]  PCP: [name], MD  Date of Evaluation: 12/23/2021 Time spent:40 minutes  Chief Complaint:  Chief Complaint   Follow-up    HISTORY/CURRENT STATUS: HPI For routine med check.  Had right knee replacement since 12/11/2021.  He is doing really well, using a walker.  Has a postop visit tomorrow to get the staples out.  He is getting home health PT right now and will be starting rehab in office later this week.  Has been in some pain understandably and the surgeon has given him oxycodone.  It helps but he feels that hydrocodone actually works better.  He has a contract with pain management,  states that provider is aware of this change for postop pain.    Depakote continues to work well.  Not having increased irritability or anger like he used to before starting it.  He has stopped drinking completely.  And almost never smokes a cigarette now.  Sleeps well with the trazodone. His wife has been out of work for the past couple of weeks to help take care of him.  States it makes him anxious and is ready for her to go back to work.  Patient denies loss of interest in usual activities and is able to enjoy things.  Denies decreased energy.  Denies decreased motivation.  ADLs are limited right now because of the recent surgery.  Personal hygiene is normal.  Appetite has not changed.  Weight is stable.  No extreme sadness, tearfulness, or feelings of hopelessness.  Denies any changes in concentration, making decisions or remembering things.  Denies suicidal or homicidal thoughts.  Patient denies increased energy with decreased need for sleep, no increased talkativeness, no racing thoughts, no impulsivity or risky behaviors, no increased spending, no increased libido, no grandiosity, no increased irritability or anger, and no hallucinations.  No abdominal pain, nausea, constipation, or diarrhea.  No jaundice.  Denies easy bruising or  bleeding, specifically no gum bleeding when he brushes his teeth, no nosebleeds.  No recent injuries such as an accidental laceration where the bleeding did not stop.  Denies dizziness, syncope, seizures, numbness, tingling, tremor, tics, slurred speech, confusion.  Has right knee pain.  No abnormal muscle movements.  Individual Medical History/ Review of Systems: Changes? :Yes   see HPI  Past medications for mental health diagnoses include: Wellbutrin, Elavil, Effexor, Ambien, Gabapentin, Xanax, Sonata, Lunesta,   Allergies: Bupropion, Morphine and related, Prozac [fluoxetine hcl], and Sulfa antibiotics  Current Medications:  Current Outpatient Medications:    Adalimumab 40 MG/0.8ML PSKT, Inject 40 mg into the skin once a week., Disp: , Rfl:    allopurinol (ZYLOPRIM) 300 MG tablet, Take 300 mg by mouth every evening., Disp: , Rfl:    divalproex (DEPAKOTE ER) 250 MG 24 hr tablet, Take 1 tablet (250 mg total) by mouth at bedtime. (Patient taking differently: Take 250 mg by mouth daily.), Disp: , Rfl:    esomeprazole (NEXIUM) 40 MG capsule, Take 40 mg by mouth 2 (two) times daily before a meal., Disp: , Rfl:    folic acid (FOLVITE) 1 MG tablet, Take 1 tablet (1 mg total) by mouth daily., Disp: 90 tablet, Rfl: 4   HYDROcodone-acetaminophen (NORCO) 10-325 MG tablet, Take 1 tablet by mouth every 6 (six) hours as needed for moderate pain or severe pain., Disp: , Rfl:    lisinopril (ZESTRIL) 10 MG tablet, Take 10 mg by mouth daily., Disp: , Rfl:  Magnesium Oxide 400 MG CAPS, Take 2 capsules (800 mg total) by mouth daily., Disp: 60 capsule, Rfl: 1   oxyCODONE-acetaminophen (PERCOCET/ROXICET) 5-325 MG tablet, Take 1 tablet by mouth every 4 (four) hours as needed. For post op TKR, from surgeon. Doesn't take with Hydroxyzine., Disp: , Rfl:    pravastatin (PRAVACHOL) 40 MG tablet, Take 40 mg by mouth every evening., Disp: , Rfl:    thiamine 100 MG tablet, Take 1 tablet (100 mg total) by mouth daily.,  Disp: 30 tablet, Rfl: 1   traZODone (DESYREL) 100 MG tablet, Take 1-2 tablets (100-200 mg total) by mouth at bedtime as needed. for sleep (Patient taking differently: Take 100-200 mg by mouth at bedtime as needed for sleep.), Disp: 180 tablet, Rfl: 3   VASCEPA 1 g CAPS, Take 2 g by mouth 2 (two) times daily., Disp: , Rfl:    Vitamin D, Ergocalciferol, (DRISDOL) 1.25 MG (50000 UNIT) CAPS capsule, Take 50,000 Units by mouth every 7 (seven) days., Disp: , Rfl:    potassium chloride SA (KLOR-CON M) 20 MEQ tablet, Take 1 tablet (20 mEq total) by mouth 2 (two) times daily for 3 days., Disp: 6 tablet, Rfl: 0   tamsulosin (FLOMAX) 0.4 MG CAPS capsule, Take 1 capsule (0.4 mg total) by mouth daily. (Patient not taking: Reported on 10/09/2020), Disp: 30 capsule, Rfl: 1 Medication Side Effects: none  Family Medical/ Social History: Changes? No   MENTAL HEALTH EXAM:  There were no vitals taken for this visit.There is no height or weight on file to calculate BMI.  General Appearance: Casual, Well Groomed, and has bandage over her right anterior knee.  Several tattoos on legs bilaterally  Eye Contact:  Good  Speech:  Clear and Coherent and Normal Rate  Volume:  Normal  Mood:  Euthymic  Affect:  Congruent  Thought Process:  Goal Directed and Descriptions of Associations: Circumstantial  Orientation:  Full (Time, Place, and Person)  Thought Content: Logical   Suicidal Thoughts:  No  Homicidal Thoughts:  No  Memory:  WNL  Judgement:  Good  Insight:  Good  Psychomotor Activity:   Using a walker  Concentration:  Concentration: Good and Attention Span: Good  Recall:  Good  Fund of Knowledge: Good  Language: Good  Assets:  Desire for Improvement  ADL's:  Intact  Cognition: WNL  Prognosis:  Good   Labs preop  11/28/2021 CBC shows platelets 105 also has low hemoglobin and hematocrit LFTs normal  12/12/2021 CBC platelet count 94    hemoglobin 9.6, hematocrit 28.3.  Labs are followed by surgeon and  PCP right now.   Labs received from [facility] internal medicine drawn 09/05/2021 Depakote level 21 Glucose 106, BUN 10, creatinine 0.69, sodium 134, potassium 4.6, calcium 9.6, alk phos 49, AST 34, ALT 33 WBC 4.8, hemoglobin 11.6, hematocrit 33.8, platelets 129   DIAGNOSES:    ICD-10-CM   1. Situational anxiety  F41.8     2. Irritability and anger  R45.4     3. Former smoker  Z87.891     4. Alcohol use disorder, mild, in early remission  F10.11     5. Thrombocytopenia ([place])  D69.6        Receiving Psychotherapy: Yes  With [name]   RECOMMENDATIONS:  PDMP was reviewed.  Last Xanax filled 08/06/2021.  On hydrocodone known to me. I provided 40 minutes of face to face time during this encounter, including time spent before and after the visit in records review, medical  decision making, counseling pertinent to today's visit, and charting.  Farley is doing well so no changes in treatment are needed. Sleep hygiene discussed. Congratulations on smoking and alcohol cessation. We again discussed his low platelet counts.  He will be having labs within the next month, and I have asked him to get his primary provider to send a copy of those results.  He understands that having a low platelet count can be from Depakote although another risk is chronic alcohol use.  Even though he has stopped drinking now he drank for a long time and that can affect platelet destruction.  He understands to contact his primary provider or me if he starts having nosebleeds, bleeding gums when he brushes his teeth, bleeding from a cut that will not stop, or easy bruising.  At this time we will continue to monitor and keep him on Depakote as he has responded so well.  He verbalizes understanding.   Continue Depakote ER 500 mg, 1/2 pill daily. (When he needs refill will send in a prescription for 250 mg.) Continue trazodone 100 mg, 1-2 nightly as needed sleep. Cont Vitamin D 1,000 iu, bid, thiamine,  multivitamin Continue therapy with [name]. Return in 3 months.  Donnal Moat, PA-C

## 2021-12-24 DIAGNOSIS — Z471 Aftercare following joint replacement surgery: Secondary | ICD-10-CM | POA: Diagnosis not present

## 2021-12-24 DIAGNOSIS — I4891 Unspecified atrial fibrillation: Secondary | ICD-10-CM | POA: Diagnosis not present

## 2021-12-24 DIAGNOSIS — Z720 Tobacco use: Secondary | ICD-10-CM | POA: Diagnosis not present

## 2021-12-24 DIAGNOSIS — M7989 Other specified soft tissue disorders: Secondary | ICD-10-CM | POA: Diagnosis not present

## 2021-12-24 DIAGNOSIS — Z96651 Presence of right artificial knee joint: Secondary | ICD-10-CM | POA: Diagnosis not present

## 2021-12-24 DIAGNOSIS — M79661 Pain in right lower leg: Secondary | ICD-10-CM | POA: Diagnosis not present

## 2021-12-24 DIAGNOSIS — Z7982 Long term (current) use of aspirin: Secondary | ICD-10-CM | POA: Diagnosis not present

## 2022-01-01 DIAGNOSIS — M1711 Unilateral primary osteoarthritis, right knee: Secondary | ICD-10-CM | POA: Diagnosis not present

## 2022-01-01 DIAGNOSIS — Z471 Aftercare following joint replacement surgery: Secondary | ICD-10-CM | POA: Diagnosis not present

## 2022-01-01 DIAGNOSIS — Z96651 Presence of right artificial knee joint: Secondary | ICD-10-CM | POA: Diagnosis not present

## 2022-01-02 ENCOUNTER — Other Ambulatory Visit: Payer: Self-pay | Admitting: Hematology and Oncology

## 2022-01-02 ENCOUNTER — Inpatient Hospital Stay: Payer: Self-pay | Attending: Hematology and Oncology

## 2022-01-02 DIAGNOSIS — D61818 Other pancytopenia: Secondary | ICD-10-CM

## 2022-01-08 DIAGNOSIS — Z471 Aftercare following joint replacement surgery: Secondary | ICD-10-CM | POA: Diagnosis not present

## 2022-01-08 DIAGNOSIS — Z96651 Presence of right artificial knee joint: Secondary | ICD-10-CM | POA: Diagnosis not present

## 2022-01-08 DIAGNOSIS — M1711 Unilateral primary osteoarthritis, right knee: Secondary | ICD-10-CM | POA: Diagnosis not present

## 2022-01-09 ENCOUNTER — Telehealth: Payer: Self-pay | Admitting: Hematology and Oncology

## 2022-01-09 ENCOUNTER — Other Ambulatory Visit: Payer: Self-pay

## 2022-01-09 ENCOUNTER — Inpatient Hospital Stay: Payer: Self-pay | Admitting: Hematology and Oncology

## 2022-01-09 NOTE — Telephone Encounter (Signed)
.  Called patient to schedule appointment per 6/21 inbasket, patient is aware of date and time.

## 2022-01-10 DIAGNOSIS — Z471 Aftercare following joint replacement surgery: Secondary | ICD-10-CM | POA: Diagnosis not present

## 2022-01-10 DIAGNOSIS — M1711 Unilateral primary osteoarthritis, right knee: Secondary | ICD-10-CM | POA: Diagnosis not present

## 2022-01-10 DIAGNOSIS — Z96651 Presence of right artificial knee joint: Secondary | ICD-10-CM | POA: Diagnosis not present

## 2022-01-14 DIAGNOSIS — I4819 Other persistent atrial fibrillation: Secondary | ICD-10-CM | POA: Diagnosis not present

## 2022-01-15 DIAGNOSIS — Z96651 Presence of right artificial knee joint: Secondary | ICD-10-CM | POA: Diagnosis not present

## 2022-01-15 DIAGNOSIS — Z471 Aftercare following joint replacement surgery: Secondary | ICD-10-CM | POA: Diagnosis not present

## 2022-01-15 DIAGNOSIS — M1711 Unilateral primary osteoarthritis, right knee: Secondary | ICD-10-CM | POA: Diagnosis not present

## 2022-01-17 DIAGNOSIS — Z471 Aftercare following joint replacement surgery: Secondary | ICD-10-CM | POA: Diagnosis not present

## 2022-01-17 DIAGNOSIS — Z96651 Presence of right artificial knee joint: Secondary | ICD-10-CM | POA: Diagnosis not present

## 2022-01-17 DIAGNOSIS — M1711 Unilateral primary osteoarthritis, right knee: Secondary | ICD-10-CM | POA: Diagnosis not present

## 2022-01-20 DIAGNOSIS — F17200 Nicotine dependence, unspecified, uncomplicated: Secondary | ICD-10-CM | POA: Diagnosis not present

## 2022-01-20 DIAGNOSIS — Z79899 Other long term (current) drug therapy: Secondary | ICD-10-CM | POA: Diagnosis not present

## 2022-01-20 DIAGNOSIS — Z6823 Body mass index (BMI) 23.0-23.9, adult: Secondary | ICD-10-CM | POA: Diagnosis not present

## 2022-01-20 DIAGNOSIS — M545 Low back pain, unspecified: Secondary | ICD-10-CM | POA: Diagnosis not present

## 2022-01-20 DIAGNOSIS — R030 Elevated blood-pressure reading, without diagnosis of hypertension: Secondary | ICD-10-CM | POA: Diagnosis not present

## 2022-01-22 DIAGNOSIS — M1711 Unilateral primary osteoarthritis, right knee: Secondary | ICD-10-CM | POA: Diagnosis not present

## 2022-01-22 DIAGNOSIS — Z471 Aftercare following joint replacement surgery: Secondary | ICD-10-CM | POA: Diagnosis not present

## 2022-01-22 DIAGNOSIS — Z96651 Presence of right artificial knee joint: Secondary | ICD-10-CM | POA: Diagnosis not present

## 2022-01-24 DIAGNOSIS — Z471 Aftercare following joint replacement surgery: Secondary | ICD-10-CM | POA: Diagnosis not present

## 2022-01-24 DIAGNOSIS — Z96651 Presence of right artificial knee joint: Secondary | ICD-10-CM | POA: Diagnosis not present

## 2022-01-24 DIAGNOSIS — M1711 Unilateral primary osteoarthritis, right knee: Secondary | ICD-10-CM | POA: Diagnosis not present

## 2022-01-27 DIAGNOSIS — I447 Left bundle-branch block, unspecified: Secondary | ICD-10-CM | POA: Diagnosis not present

## 2022-01-27 DIAGNOSIS — I10 Essential (primary) hypertension: Secondary | ICD-10-CM | POA: Diagnosis not present

## 2022-01-27 DIAGNOSIS — E785 Hyperlipidemia, unspecified: Secondary | ICD-10-CM | POA: Diagnosis not present

## 2022-01-27 DIAGNOSIS — I4819 Other persistent atrial fibrillation: Secondary | ICD-10-CM | POA: Diagnosis not present

## 2022-01-27 DIAGNOSIS — I358 Other nonrheumatic aortic valve disorders: Secondary | ICD-10-CM | POA: Diagnosis not present

## 2022-01-27 DIAGNOSIS — I480 Paroxysmal atrial fibrillation: Secondary | ICD-10-CM | POA: Diagnosis not present

## 2022-01-29 DIAGNOSIS — Z471 Aftercare following joint replacement surgery: Secondary | ICD-10-CM | POA: Diagnosis not present

## 2022-01-29 DIAGNOSIS — Z96651 Presence of right artificial knee joint: Secondary | ICD-10-CM | POA: Diagnosis not present

## 2022-01-29 DIAGNOSIS — M1711 Unilateral primary osteoarthritis, right knee: Secondary | ICD-10-CM | POA: Diagnosis not present

## 2022-01-31 DIAGNOSIS — Z96651 Presence of right artificial knee joint: Secondary | ICD-10-CM | POA: Diagnosis not present

## 2022-01-31 DIAGNOSIS — Z471 Aftercare following joint replacement surgery: Secondary | ICD-10-CM | POA: Diagnosis not present

## 2022-01-31 DIAGNOSIS — M1711 Unilateral primary osteoarthritis, right knee: Secondary | ICD-10-CM | POA: Diagnosis not present

## 2022-02-05 DIAGNOSIS — M1711 Unilateral primary osteoarthritis, right knee: Secondary | ICD-10-CM | POA: Diagnosis not present

## 2022-02-05 DIAGNOSIS — Z471 Aftercare following joint replacement surgery: Secondary | ICD-10-CM | POA: Diagnosis not present

## 2022-02-05 DIAGNOSIS — Z96651 Presence of right artificial knee joint: Secondary | ICD-10-CM | POA: Diagnosis not present

## 2022-02-07 DIAGNOSIS — M1711 Unilateral primary osteoarthritis, right knee: Secondary | ICD-10-CM | POA: Diagnosis not present

## 2022-02-07 DIAGNOSIS — Z96651 Presence of right artificial knee joint: Secondary | ICD-10-CM | POA: Diagnosis not present

## 2022-02-07 DIAGNOSIS — Z471 Aftercare following joint replacement surgery: Secondary | ICD-10-CM | POA: Diagnosis not present

## 2022-02-12 DIAGNOSIS — M1711 Unilateral primary osteoarthritis, right knee: Secondary | ICD-10-CM | POA: Diagnosis not present

## 2022-02-12 DIAGNOSIS — Z471 Aftercare following joint replacement surgery: Secondary | ICD-10-CM | POA: Diagnosis not present

## 2022-02-12 DIAGNOSIS — Z96651 Presence of right artificial knee joint: Secondary | ICD-10-CM | POA: Diagnosis not present

## 2022-02-19 DIAGNOSIS — G894 Chronic pain syndrome: Secondary | ICD-10-CM | POA: Diagnosis not present

## 2022-02-19 DIAGNOSIS — F17200 Nicotine dependence, unspecified, uncomplicated: Secondary | ICD-10-CM | POA: Diagnosis not present

## 2022-02-19 DIAGNOSIS — M069 Rheumatoid arthritis, unspecified: Secondary | ICD-10-CM | POA: Diagnosis not present

## 2022-02-19 DIAGNOSIS — Z87891 Personal history of nicotine dependence: Secondary | ICD-10-CM | POA: Diagnosis not present

## 2022-02-19 DIAGNOSIS — F1120 Opioid dependence, uncomplicated: Secondary | ICD-10-CM | POA: Diagnosis not present

## 2022-02-19 DIAGNOSIS — E782 Mixed hyperlipidemia: Secondary | ICD-10-CM | POA: Diagnosis not present

## 2022-02-19 DIAGNOSIS — Z79899 Other long term (current) drug therapy: Secondary | ICD-10-CM | POA: Diagnosis not present

## 2022-02-19 DIAGNOSIS — I10 Essential (primary) hypertension: Secondary | ICD-10-CM | POA: Diagnosis not present

## 2022-02-19 DIAGNOSIS — N400 Enlarged prostate without lower urinary tract symptoms: Secondary | ICD-10-CM | POA: Diagnosis not present

## 2022-02-19 DIAGNOSIS — Z6822 Body mass index (BMI) 22.0-22.9, adult: Secondary | ICD-10-CM | POA: Diagnosis not present

## 2022-02-19 DIAGNOSIS — R030 Elevated blood-pressure reading, without diagnosis of hypertension: Secondary | ICD-10-CM | POA: Diagnosis not present

## 2022-02-19 DIAGNOSIS — M545 Low back pain, unspecified: Secondary | ICD-10-CM | POA: Diagnosis not present

## 2022-02-19 DIAGNOSIS — Z6823 Body mass index (BMI) 23.0-23.9, adult: Secondary | ICD-10-CM | POA: Diagnosis not present

## 2022-02-19 DIAGNOSIS — M1990 Unspecified osteoarthritis, unspecified site: Secondary | ICD-10-CM | POA: Diagnosis not present

## 2022-02-19 DIAGNOSIS — E559 Vitamin D deficiency, unspecified: Secondary | ICD-10-CM | POA: Diagnosis not present

## 2022-02-19 DIAGNOSIS — G8929 Other chronic pain: Secondary | ICD-10-CM | POA: Diagnosis not present

## 2022-02-25 ENCOUNTER — Telehealth: Payer: Self-pay | Admitting: Physician Assistant

## 2022-02-25 ENCOUNTER — Other Ambulatory Visit: Payer: Self-pay

## 2022-02-25 MED ORDER — TRAZODONE HCL 100 MG PO TABS: 100.0000 mg | ORAL_TABLET | Freq: Every evening | ORAL | 0 refills | Status: DC | PRN

## 2022-02-25 NOTE — Telephone Encounter (Signed)
Rx sent 

## 2022-02-25 NOTE — Telephone Encounter (Signed)
Patient's wife Lorn Junes called for refill on Trazadone '100mg'$ . Appt 9/5 Ph: [place], [place]

## 2022-03-06 ENCOUNTER — Other Ambulatory Visit: Payer: Self-pay | Admitting: Physician Assistant

## 2022-03-21 DIAGNOSIS — M069 Rheumatoid arthritis, unspecified: Secondary | ICD-10-CM | POA: Diagnosis not present

## 2022-03-21 DIAGNOSIS — R030 Elevated blood-pressure reading, without diagnosis of hypertension: Secondary | ICD-10-CM | POA: Diagnosis not present

## 2022-03-21 DIAGNOSIS — Z79899 Other long term (current) drug therapy: Secondary | ICD-10-CM | POA: Diagnosis not present

## 2022-03-21 DIAGNOSIS — F17200 Nicotine dependence, unspecified, uncomplicated: Secondary | ICD-10-CM | POA: Diagnosis not present

## 2022-03-21 DIAGNOSIS — Z6822 Body mass index (BMI) 22.0-22.9, adult: Secondary | ICD-10-CM | POA: Diagnosis not present

## 2022-03-25 ENCOUNTER — Encounter: Payer: Self-pay | Admitting: Physician Assistant

## 2022-03-25 ENCOUNTER — Ambulatory Visit (INDEPENDENT_AMBULATORY_CARE_PROVIDER_SITE_OTHER): Payer: Medicare PPO | Admitting: Physician Assistant

## 2022-03-25 DIAGNOSIS — G4700 Insomnia, unspecified: Secondary | ICD-10-CM | POA: Diagnosis not present

## 2022-03-25 DIAGNOSIS — F418 Other specified anxiety disorders: Secondary | ICD-10-CM | POA: Diagnosis not present

## 2022-03-25 MED ORDER — DIVALPROEX SODIUM ER 250 MG PO TB24: 250.0000 mg | ORAL_TABLET | Freq: Every day | ORAL | 1 refills | Status: DC

## 2022-03-25 NOTE — Progress Notes (Signed)
[facility]  Patient ID: Eddie Taylor,  MRN: [id number]  PCP: [name], MD  Date of Evaluation: 03/25/2022 Time spent:20 minutes  Chief Complaint:  Chief Complaint   Follow-up    HISTORY/CURRENT STATUS: HPI For routine med check.  Eddie Taylor is doing really well.  He continues to see Eddie Taylor every 2 weeks or so for therapy, which is really helping.  He drinks a couple of beers a month at the most now.  States he does not want it like he used to.  Smokes a cigarette here and there but not even 1 pack/month.  He feels that the medications are still working well.  He sleeps well most of the time, getting 6 to 7 hours straight, except 1 bathroom break around 3 AM.  He and his wife just celebrated their 48th wedding anniversary.  Things are going well with her.  He is retired and enjoying relaxing, watching TV, hanging out with friends.  Patient is able to enjoy things.  Energy and motivation are good.   No extreme sadness, tearfulness, or feelings of hopelessness.  ADLs and personal hygiene are normal.   Denies any changes in making decisions, or remembering things.  Appetite has not changed.  Has gained a few pounds in the past couple of months, but has needed to after all the weight loss a year ago due to stomach issues.  Denies suicidal or homicidal thoughts.  Patient denies increased energy with decreased need for sleep, increased talkativeness, racing thoughts, impulsivity or risky behaviors, increased spending, increased libido, grandiosity, increased irritability or anger, paranoia, or hallucinations.  Denies dizziness, syncope, seizures, numbness, tingling, tremor, tics, unsteady gait, slurred speech, confusion. Denies muscle or joint pain, stiffness, or dystonia.  Individual Medical History/ Review of Systems: Changes? :No     Past medications for mental health diagnoses include: Wellbutrin, Elavil, Effexor, Ambien, Gabapentin, Xanax, Sonata, Lunesta,   Allergies:  Bupropion, Fluoxetine, Hydroxychloroquine, Morphine and related, Prozac [fluoxetine hcl], and Sulfa antibiotics  Current Medications:  Current Outpatient Medications:    Adalimumab 40 MG/0.8ML PSKT, Inject 40 mg into the skin once a week., Disp: , Rfl:    allopurinol (ZYLOPRIM) 300 MG tablet, Take 300 mg by mouth every evening., Disp: , Rfl:    aspirin EC 81 MG tablet, Take 81 mg by mouth daily. Swallow whole., Disp: , Rfl:    esomeprazole (NEXIUM) 40 MG capsule, Take 40 mg by mouth 2 (two) times daily before a meal., Disp: , Rfl:    HYDROcodone-acetaminophen (NORCO) 10-325 MG tablet, Take 1 tablet by mouth every 6 (six) hours as needed for moderate pain or severe pain., Disp: , Rfl:    lisinopril (ZESTRIL) 10 MG tablet, Take 10 mg by mouth daily., Disp: , Rfl:    Magnesium Oxide 400 MG CAPS, Take 2 capsules (800 mg total) by mouth daily., Disp: 60 capsule, Rfl: 1   pravastatin (PRAVACHOL) 40 MG tablet, Take 40 mg by mouth every evening., Disp: , Rfl:    thiamine 100 MG tablet, Take 1 tablet (100 mg total) by mouth daily., Disp: 30 tablet, Rfl: 1   traZODone (DESYREL) 100 MG tablet, Take 1-2 tablets (100-200 mg total) by mouth at bedtime as needed. for sleep, Disp: 180 tablet, Rfl: 0   VASCEPA 1 g CAPS, Take 2 g by mouth 2 (two) times daily., Disp: , Rfl:    divalproex (DEPAKOTE ER) 250 MG 24 hr tablet, Take 1 tablet (250 mg total) by mouth daily., Disp: 90 tablet,  Rfl: 1   folic acid (FOLVITE) 1 MG tablet, Take 1 tablet (1 mg total) by mouth daily. (Patient not taking: Reported on 03/25/2022), Disp: 90 tablet, Rfl: 4   oxyCODONE-acetaminophen (PERCOCET/ROXICET) 5-325 MG tablet, Take 1 tablet by mouth every 4 (four) hours as needed. For post op TKR, from surgeon. Doesn't take with Hydroxyzine. (Patient not taking: Reported on 03/25/2022), Disp: , Rfl:    potassium chloride SA (KLOR-CON M) 20 MEQ tablet, Take 1 tablet (20 mEq total) by mouth 2 (two) times daily for 3 days., Disp: 6 tablet, Rfl: 0    tamsulosin (FLOMAX) 0.4 MG CAPS capsule, Take 1 capsule (0.4 mg total) by mouth daily. (Patient not taking: Reported on 10/09/2020), Disp: 30 capsule, Rfl: 1   Vitamin D, Ergocalciferol, (DRISDOL) 1.25 MG (50000 UNIT) CAPS capsule, Take 50,000 Units by mouth every 7 (seven) days. (Patient not taking: Reported on 03/25/2022), Disp: , Rfl:  Medication Side Effects: none  Family Medical/ Social History: Changes? No  MENTAL HEALTH EXAM:  There were no vitals taken for this visit.There is no height or weight on file to calculate BMI.  General Appearance: Casual and Well Groomed  Eye Contact:  Good  Speech:  Clear and Coherent and Normal Rate  Volume:  Normal  Mood:  Euthymic  Affect:  Congruent  Thought Process:  Goal Directed and Descriptions of Associations: Circumstantial  Orientation:  Full (Time, Place, and Person)  Thought Content: Logical   Suicidal Thoughts:  No  Homicidal Thoughts:  No  Memory:  WNL  Judgement:  Good  Insight:  Good  Psychomotor Activity:  Normal  Concentration:  Concentration: Good and Attention Span: Good  Recall:  Good  Fund of Knowledge: Good  Language: Good  Assets:  Desire for Improvement  ADL's:  Intact  Cognition: WNL  Prognosis:  Good   Labs pre-op  11/28/2021 CBC shows platelets 105 also has low hemoglobin and hematocrit LFTs normal  12/12/2021 CBC platelet count 94, hemoglobin 9.6, hematocrit 28.3.  Labs are followed by surgeon and PCP right now.   Labs received from [facility] internal medicine drawn 09/05/2021 Depakote level 21 Glucose 106, BUN 10, creatinine 0.69, sodium 134, potassium 4.6, calcium 9.6, alk phos 49, AST 34, ALT 33 WBC 4.8, hemoglobin 11.6, hematocrit 33.8, platelets 129  DIAGNOSES:    ICD-10-CM   1. Situational anxiety  F41.8     2. Insomnia, unspecified type  G47.00       Receiving Psychotherapy: Yes  With Eddie Taylor  RECOMMENDATIONS:  PDMP was reviewed.  Last Xanax filled 08/06/2021.  On hydrocodone  known to me. I provided 20 minutes of face to face time during this encounter, including time spent before and after the visit in records review, medical decision making, counseling pertinent to today's visit, and charting.    He's doing really well so no med changes are necessary.  Continue Depakote ER  250 mg daily. Continue trazodone 100 mg, 1-2 nightly as needed sleep. Cont Vitamin D 1,000 iu, thiamine, multivitamin daily Continue therapy with Eddie Taylor. He will have routine labs next month at his complete physical exam, he will get the results sent to me.  There is no need to do a Depakote level because he is on such a low dose. Return in 6 months.  Donnal Moat, PA-C

## 2022-04-08 ENCOUNTER — Other Ambulatory Visit: Payer: Self-pay | Admitting: Physician Assistant

## 2022-04-21 DIAGNOSIS — M545 Low back pain, unspecified: Secondary | ICD-10-CM | POA: Diagnosis not present

## 2022-04-21 DIAGNOSIS — R030 Elevated blood-pressure reading, without diagnosis of hypertension: Secondary | ICD-10-CM | POA: Diagnosis not present

## 2022-04-21 DIAGNOSIS — F17200 Nicotine dependence, unspecified, uncomplicated: Secondary | ICD-10-CM | POA: Diagnosis not present

## 2022-04-21 DIAGNOSIS — Z6822 Body mass index (BMI) 22.0-22.9, adult: Secondary | ICD-10-CM | POA: Diagnosis not present

## 2022-04-21 DIAGNOSIS — Z933 Colostomy status: Secondary | ICD-10-CM | POA: Diagnosis not present

## 2022-04-21 DIAGNOSIS — M1990 Unspecified osteoarthritis, unspecified site: Secondary | ICD-10-CM | POA: Diagnosis not present

## 2022-04-21 DIAGNOSIS — Z79899 Other long term (current) drug therapy: Secondary | ICD-10-CM | POA: Diagnosis not present

## 2022-04-21 DIAGNOSIS — F1120 Opioid dependence, uncomplicated: Secondary | ICD-10-CM | POA: Diagnosis not present

## 2022-04-21 DIAGNOSIS — M069 Rheumatoid arthritis, unspecified: Secondary | ICD-10-CM | POA: Diagnosis not present

## 2022-04-28 DIAGNOSIS — I77819 Aortic ectasia, unspecified site: Secondary | ICD-10-CM | POA: Diagnosis not present

## 2022-04-28 DIAGNOSIS — Z7982 Long term (current) use of aspirin: Secondary | ICD-10-CM | POA: Diagnosis not present

## 2022-04-28 DIAGNOSIS — Z720 Tobacco use: Secondary | ICD-10-CM | POA: Diagnosis not present

## 2022-04-28 DIAGNOSIS — I4891 Unspecified atrial fibrillation: Secondary | ICD-10-CM | POA: Diagnosis not present

## 2022-04-28 DIAGNOSIS — E785 Hyperlipidemia, unspecified: Secondary | ICD-10-CM | POA: Diagnosis not present

## 2022-04-28 DIAGNOSIS — I10 Essential (primary) hypertension: Secondary | ICD-10-CM | POA: Diagnosis not present

## 2022-05-26 ENCOUNTER — Other Ambulatory Visit (HOSPITAL_COMMUNITY): Payer: Self-pay

## 2022-05-26 DIAGNOSIS — Z79899 Other long term (current) drug therapy: Secondary | ICD-10-CM | POA: Diagnosis not present

## 2022-05-26 DIAGNOSIS — R030 Elevated blood-pressure reading, without diagnosis of hypertension: Secondary | ICD-10-CM | POA: Diagnosis not present

## 2022-05-26 DIAGNOSIS — F17200 Nicotine dependence, unspecified, uncomplicated: Secondary | ICD-10-CM | POA: Diagnosis not present

## 2022-05-26 DIAGNOSIS — M545 Low back pain, unspecified: Secondary | ICD-10-CM | POA: Diagnosis not present

## 2022-05-26 DIAGNOSIS — F1120 Opioid dependence, uncomplicated: Secondary | ICD-10-CM | POA: Diagnosis not present

## 2022-05-26 DIAGNOSIS — M1990 Unspecified osteoarthritis, unspecified site: Secondary | ICD-10-CM | POA: Diagnosis not present

## 2022-05-26 DIAGNOSIS — G894 Chronic pain syndrome: Secondary | ICD-10-CM | POA: Diagnosis not present

## 2022-05-26 DIAGNOSIS — Z6822 Body mass index (BMI) 22.0-22.9, adult: Secondary | ICD-10-CM | POA: Diagnosis not present

## 2022-05-26 DIAGNOSIS — G8929 Other chronic pain: Secondary | ICD-10-CM | POA: Diagnosis not present

## 2022-05-26 DIAGNOSIS — Z1211 Encounter for screening for malignant neoplasm of colon: Secondary | ICD-10-CM | POA: Diagnosis not present

## 2022-05-26 MED ORDER — HYDROCODONE-ACETAMINOPHEN 10-325 MG PO TABS
1.0000 | ORAL_TABLET | Freq: Four times a day (QID) | ORAL | 0 refills | Status: DC | PRN
Start: 2022-05-26 — End: 2022-06-25
  Filled 2022-05-26 – 2022-05-27 (×2): qty 120, 30d supply, fill #0

## 2022-05-26 MED ORDER — NALOXONE HCL 4 MG/0.1ML NA LIQD
NASAL | 1 refills | Status: AC
  Filled 2022-05-26: qty 2, 30d supply, fill #0

## 2022-05-27 ENCOUNTER — Other Ambulatory Visit (HOSPITAL_COMMUNITY): Payer: Self-pay

## 2022-05-29 ENCOUNTER — Other Ambulatory Visit: Payer: Self-pay | Admitting: Physician Assistant

## 2022-05-29 DIAGNOSIS — N400 Enlarged prostate without lower urinary tract symptoms: Secondary | ICD-10-CM | POA: Diagnosis not present

## 2022-05-29 DIAGNOSIS — R7989 Other specified abnormal findings of blood chemistry: Secondary | ICD-10-CM | POA: Diagnosis not present

## 2022-05-29 DIAGNOSIS — E559 Vitamin D deficiency, unspecified: Secondary | ICD-10-CM | POA: Diagnosis not present

## 2022-05-29 DIAGNOSIS — Z933 Colostomy status: Secondary | ICD-10-CM | POA: Diagnosis not present

## 2022-05-29 DIAGNOSIS — M1990 Unspecified osteoarthritis, unspecified site: Secondary | ICD-10-CM | POA: Diagnosis not present

## 2022-05-29 DIAGNOSIS — Z87891 Personal history of nicotine dependence: Secondary | ICD-10-CM | POA: Diagnosis not present

## 2022-05-29 DIAGNOSIS — Z23 Encounter for immunization: Secondary | ICD-10-CM | POA: Diagnosis not present

## 2022-05-29 DIAGNOSIS — E782 Mixed hyperlipidemia: Secondary | ICD-10-CM | POA: Diagnosis not present

## 2022-05-29 DIAGNOSIS — Z79899 Other long term (current) drug therapy: Secondary | ICD-10-CM | POA: Diagnosis not present

## 2022-05-29 DIAGNOSIS — I10 Essential (primary) hypertension: Secondary | ICD-10-CM | POA: Diagnosis not present

## 2022-06-02 DIAGNOSIS — M1009 Idiopathic gout, multiple sites: Secondary | ICD-10-CM | POA: Diagnosis not present

## 2022-06-02 DIAGNOSIS — Z6823 Body mass index (BMI) 23.0-23.9, adult: Secondary | ICD-10-CM | POA: Diagnosis not present

## 2022-06-02 DIAGNOSIS — G894 Chronic pain syndrome: Secondary | ICD-10-CM | POA: Diagnosis not present

## 2022-06-02 DIAGNOSIS — M0579 Rheumatoid arthritis with rheumatoid factor of multiple sites without organ or systems involvement: Secondary | ICD-10-CM | POA: Diagnosis not present

## 2022-06-02 DIAGNOSIS — M1991 Primary osteoarthritis, unspecified site: Secondary | ICD-10-CM | POA: Diagnosis not present

## 2022-06-02 DIAGNOSIS — Z79899 Other long term (current) drug therapy: Secondary | ICD-10-CM | POA: Diagnosis not present

## 2022-06-17 DIAGNOSIS — Z1211 Encounter for screening for malignant neoplasm of colon: Secondary | ICD-10-CM | POA: Diagnosis not present

## 2022-06-17 DIAGNOSIS — Z8719 Personal history of other diseases of the digestive system: Secondary | ICD-10-CM | POA: Diagnosis not present

## 2022-06-17 DIAGNOSIS — Z933 Colostomy status: Secondary | ICD-10-CM | POA: Diagnosis not present

## 2022-06-25 ENCOUNTER — Other Ambulatory Visit (HOSPITAL_COMMUNITY): Payer: Self-pay

## 2022-06-25 DIAGNOSIS — F17200 Nicotine dependence, unspecified, uncomplicated: Secondary | ICD-10-CM | POA: Diagnosis not present

## 2022-06-25 DIAGNOSIS — Z79899 Other long term (current) drug therapy: Secondary | ICD-10-CM | POA: Diagnosis not present

## 2022-06-25 DIAGNOSIS — R030 Elevated blood-pressure reading, without diagnosis of hypertension: Secondary | ICD-10-CM | POA: Diagnosis not present

## 2022-06-25 DIAGNOSIS — M545 Low back pain, unspecified: Secondary | ICD-10-CM | POA: Diagnosis not present

## 2022-06-25 DIAGNOSIS — Z6823 Body mass index (BMI) 23.0-23.9, adult: Secondary | ICD-10-CM | POA: Diagnosis not present

## 2022-06-25 MED ORDER — HYDROCODONE-ACETAMINOPHEN 10-325 MG PO TABS
1.0000 | ORAL_TABLET | Freq: Four times a day (QID) | ORAL | 0 refills | Status: DC | PRN
  Filled 2022-06-25: qty 120, 30d supply, fill #0

## 2022-07-25 ENCOUNTER — Other Ambulatory Visit (HOSPITAL_COMMUNITY): Payer: Self-pay

## 2022-07-25 DIAGNOSIS — M545 Low back pain, unspecified: Secondary | ICD-10-CM | POA: Diagnosis not present

## 2022-07-25 DIAGNOSIS — Z79899 Other long term (current) drug therapy: Secondary | ICD-10-CM | POA: Diagnosis not present

## 2022-07-25 DIAGNOSIS — M1990 Unspecified osteoarthritis, unspecified site: Secondary | ICD-10-CM | POA: Diagnosis not present

## 2022-07-25 DIAGNOSIS — F17200 Nicotine dependence, unspecified, uncomplicated: Secondary | ICD-10-CM | POA: Diagnosis not present

## 2022-07-25 DIAGNOSIS — Z6823 Body mass index (BMI) 23.0-23.9, adult: Secondary | ICD-10-CM | POA: Diagnosis not present

## 2022-07-25 MED ORDER — HYDROCODONE-ACETAMINOPHEN 10-325 MG PO TABS
1.0000 | ORAL_TABLET | Freq: Four times a day (QID) | ORAL | 0 refills | Status: DC | PRN
Start: 2022-07-25 — End: 2022-08-26
  Filled 2022-07-25: qty 120, 30d supply, fill #0

## 2022-07-25 MED ORDER — NALOXONE HCL 4 MG/0.1ML NA LIQD
1.0000 | NASAL | 1 refills | Status: AC | PRN
  Filled 2022-07-25: qty 2, 1d supply, fill #0

## 2022-08-01 DIAGNOSIS — I10 Essential (primary) hypertension: Secondary | ICD-10-CM | POA: Diagnosis not present

## 2022-08-01 DIAGNOSIS — I358 Other nonrheumatic aortic valve disorders: Secondary | ICD-10-CM | POA: Diagnosis not present

## 2022-08-01 DIAGNOSIS — I480 Paroxysmal atrial fibrillation: Secondary | ICD-10-CM | POA: Diagnosis not present

## 2022-08-01 DIAGNOSIS — E785 Hyperlipidemia, unspecified: Secondary | ICD-10-CM | POA: Diagnosis not present

## 2022-08-12 DIAGNOSIS — K219 Gastro-esophageal reflux disease without esophagitis: Secondary | ICD-10-CM | POA: Diagnosis not present

## 2022-08-12 DIAGNOSIS — L853 Xerosis cutis: Secondary | ICD-10-CM | POA: Diagnosis not present

## 2022-08-12 DIAGNOSIS — Z933 Colostomy status: Secondary | ICD-10-CM | POA: Diagnosis not present

## 2022-08-12 DIAGNOSIS — I10 Essential (primary) hypertension: Secondary | ICD-10-CM | POA: Diagnosis not present

## 2022-08-12 DIAGNOSIS — L821 Other seborrheic keratosis: Secondary | ICD-10-CM | POA: Diagnosis not present

## 2022-08-25 DIAGNOSIS — H40013 Open angle with borderline findings, low risk, bilateral: Secondary | ICD-10-CM | POA: Diagnosis not present

## 2022-08-25 DIAGNOSIS — H5213 Myopia, bilateral: Secondary | ICD-10-CM | POA: Diagnosis not present

## 2022-08-25 DIAGNOSIS — H25813 Combined forms of age-related cataract, bilateral: Secondary | ICD-10-CM | POA: Diagnosis not present

## 2022-08-25 DIAGNOSIS — H524 Presbyopia: Secondary | ICD-10-CM | POA: Diagnosis not present

## 2022-08-25 DIAGNOSIS — H47233 Glaucomatous optic atrophy, bilateral: Secondary | ICD-10-CM | POA: Diagnosis not present

## 2022-08-25 DIAGNOSIS — H353131 Nonexudative age-related macular degeneration, bilateral, early dry stage: Secondary | ICD-10-CM | POA: Diagnosis not present

## 2022-08-25 DIAGNOSIS — H52223 Regular astigmatism, bilateral: Secondary | ICD-10-CM | POA: Diagnosis not present

## 2022-08-25 DIAGNOSIS — H40003 Preglaucoma, unspecified, bilateral: Secondary | ICD-10-CM | POA: Diagnosis not present

## 2022-08-26 ENCOUNTER — Other Ambulatory Visit (HOSPITAL_COMMUNITY): Payer: Self-pay

## 2022-08-26 DIAGNOSIS — R030 Elevated blood-pressure reading, without diagnosis of hypertension: Secondary | ICD-10-CM | POA: Diagnosis not present

## 2022-08-26 DIAGNOSIS — F17200 Nicotine dependence, unspecified, uncomplicated: Secondary | ICD-10-CM | POA: Diagnosis not present

## 2022-08-26 DIAGNOSIS — F1120 Opioid dependence, uncomplicated: Secondary | ICD-10-CM | POA: Diagnosis not present

## 2022-08-26 DIAGNOSIS — Z79899 Other long term (current) drug therapy: Secondary | ICD-10-CM | POA: Diagnosis not present

## 2022-08-26 DIAGNOSIS — M545 Low back pain, unspecified: Secondary | ICD-10-CM | POA: Diagnosis not present

## 2022-08-26 DIAGNOSIS — Z933 Colostomy status: Secondary | ICD-10-CM | POA: Diagnosis not present

## 2022-08-26 DIAGNOSIS — G8929 Other chronic pain: Secondary | ICD-10-CM | POA: Diagnosis not present

## 2022-08-26 DIAGNOSIS — G894 Chronic pain syndrome: Secondary | ICD-10-CM | POA: Diagnosis not present

## 2022-08-26 DIAGNOSIS — M1990 Unspecified osteoarthritis, unspecified site: Secondary | ICD-10-CM | POA: Diagnosis not present

## 2022-08-26 DIAGNOSIS — Z6823 Body mass index (BMI) 23.0-23.9, adult: Secondary | ICD-10-CM | POA: Diagnosis not present

## 2022-08-26 MED ORDER — HYDROCODONE-ACETAMINOPHEN 10-325 MG PO TABS
1.0000 | ORAL_TABLET | Freq: Four times a day (QID) | ORAL | 0 refills | Status: DC | PRN
  Filled 2022-08-26 (×2): qty 120, 30d supply, fill #0

## 2022-08-26 MED ORDER — NALOXONE HCL 4 MG/0.1ML NA LIQD
1.0000 | NASAL | 1 refills | Status: AC | PRN
  Filled 2022-08-26: qty 2, 1d supply, fill #0

## 2022-09-02 ENCOUNTER — Other Ambulatory Visit: Payer: Self-pay | Admitting: Physician Assistant

## 2022-09-08 ENCOUNTER — Other Ambulatory Visit (HOSPITAL_COMMUNITY): Payer: Self-pay

## 2022-09-08 DIAGNOSIS — E782 Mixed hyperlipidemia: Secondary | ICD-10-CM | POA: Diagnosis not present

## 2022-09-08 DIAGNOSIS — N400 Enlarged prostate without lower urinary tract symptoms: Secondary | ICD-10-CM | POA: Diagnosis not present

## 2022-09-08 DIAGNOSIS — E559 Vitamin D deficiency, unspecified: Secondary | ICD-10-CM | POA: Diagnosis not present

## 2022-09-08 DIAGNOSIS — Z6823 Body mass index (BMI) 23.0-23.9, adult: Secondary | ICD-10-CM | POA: Diagnosis not present

## 2022-09-08 DIAGNOSIS — I10 Essential (primary) hypertension: Secondary | ICD-10-CM | POA: Diagnosis not present

## 2022-09-17 DIAGNOSIS — Z933 Colostomy status: Secondary | ICD-10-CM | POA: Diagnosis not present

## 2022-09-24 ENCOUNTER — Ambulatory Visit: Payer: Medicare PPO | Admitting: Physician Assistant

## 2022-09-24 ENCOUNTER — Other Ambulatory Visit (HOSPITAL_COMMUNITY): Payer: Self-pay

## 2022-09-24 DIAGNOSIS — F17200 Nicotine dependence, unspecified, uncomplicated: Secondary | ICD-10-CM | POA: Diagnosis not present

## 2022-09-24 DIAGNOSIS — Z6822 Body mass index (BMI) 22.0-22.9, adult: Secondary | ICD-10-CM | POA: Diagnosis not present

## 2022-09-24 DIAGNOSIS — M545 Low back pain, unspecified: Secondary | ICD-10-CM | POA: Diagnosis not present

## 2022-09-24 DIAGNOSIS — Z79899 Other long term (current) drug therapy: Secondary | ICD-10-CM | POA: Diagnosis not present

## 2022-09-24 DIAGNOSIS — R030 Elevated blood-pressure reading, without diagnosis of hypertension: Secondary | ICD-10-CM | POA: Diagnosis not present

## 2022-09-24 DIAGNOSIS — G8929 Other chronic pain: Secondary | ICD-10-CM | POA: Diagnosis not present

## 2022-09-24 MED ORDER — HYDROCODONE-ACETAMINOPHEN 10-325 MG PO TABS
1.0000 | ORAL_TABLET | Freq: Four times a day (QID) | ORAL | 0 refills | Status: DC | PRN
  Filled 2022-09-24: qty 120, 30d supply, fill #0

## 2022-09-30 ENCOUNTER — Ambulatory Visit (INDEPENDENT_AMBULATORY_CARE_PROVIDER_SITE_OTHER): Payer: Medicare PPO | Admitting: Physician Assistant

## 2022-09-30 ENCOUNTER — Encounter: Payer: Self-pay | Admitting: Physician Assistant

## 2022-09-30 DIAGNOSIS — Z79899 Other long term (current) drug therapy: Secondary | ICD-10-CM

## 2022-09-30 DIAGNOSIS — F418 Other specified anxiety disorders: Secondary | ICD-10-CM

## 2022-09-30 DIAGNOSIS — F39 Unspecified mood [affective] disorder: Secondary | ICD-10-CM | POA: Diagnosis not present

## 2022-09-30 MED ORDER — DIVALPROEX SODIUM ER 250 MG PO TB24: 250.0000 mg | ORAL_TABLET | Freq: Every day | ORAL | 1 refills | Status: DC

## 2022-09-30 NOTE — Progress Notes (Signed)
[facility]  Patient ID: Eddie Taylor,  MRN: [id number]  PCP: [name], MD  Date of Evaluation: 09/30/2022 Time spent:20 minutes  Chief Complaint:  Chief Complaint   Follow-up    HISTORY/CURRENT STATUS: HPI For routine med check.  Doing well. Has some stress at home, wife has been sick which is difficult but they are getting through it.  He feels like the Depakote is still working very well.  His mood is good most of the time, "I do not let things bother me like I used to."  He continues to see [name], [facility] which has been very helpful.  He enjoys watching college basketball.  Energy and motivation are good.   No extreme sadness, tearfulness, or feelings of hopelessness.  Sleeps well most of the time. ADLs and personal hygiene are normal.   Denies any changes in concentration, making decisions, or remembering things.  Appetite has not changed.  Weight is stable. Denies suicidal or homicidal thoughts.  Patient denies increased energy with decreased need for sleep, increased talkativeness, racing thoughts, impulsivity or risky behaviors, increased spending, increased libido, grandiosity, increased irritability or anger, paranoia, or hallucinations.  Denies dizziness, syncope, seizures, numbness, tingling, tremor, tics, unsteady gait, slurred speech, confusion. Denies muscle or joint pain, stiffness, or dystonia.  Denies jaundice, abdominal pain, dark urine.  Individual Medical History/ Review of Systems: Changes? :No     Past medications for mental health diagnoses include: Wellbutrin, Elavil, Effexor, Ambien, Gabapentin, Xanax, Sonata, Lunesta,   Allergies: Bupropion, Fluoxetine, Hydroxychloroquine, Morphine and related, Prozac [fluoxetine hcl], and Sulfa antibiotics  Current Medications:  Current Outpatient Medications:    Adalimumab 40 MG/0.8ML PSKT, Inject 40 mg into the skin once a week., Disp: , Rfl:    allopurinol (ZYLOPRIM) 300 MG tablet, Take 300 mg  by mouth every evening., Disp: , Rfl:    aspirin EC 81 MG tablet, Take 81 mg by mouth daily. Swallow whole., Disp: , Rfl:    esomeprazole (NEXIUM) 40 MG capsule, Take 40 mg by mouth 2 (two) times daily before a meal., Disp: , Rfl:    HYDROcodone-acetaminophen (NORCO) 10-325 MG tablet, Take 1 tablet by mouth every 6 (six) hours as needed for moderate pain or severe pain., Disp: , Rfl:    HYDROcodone-acetaminophen (NORCO) 10-325 MG tablet, Take 1 tablet by mouth 4 (four) times daily as needed., Disp: 120 tablet, Rfl: 0   lisinopril (ZESTRIL) 10 MG tablet, Take 10 mg by mouth daily., Disp: , Rfl:    Magnesium Oxide 400 MG CAPS, Take 2 capsules (800 mg total) by mouth daily., Disp: 60 capsule, Rfl: 1   pravastatin (PRAVACHOL) 40 MG tablet, Take 40 mg by mouth every evening., Disp: , Rfl:    thiamine 100 MG tablet, Take 1 tablet (100 mg total) by mouth daily., Disp: 30 tablet, Rfl: 1   traZODone (DESYREL) 100 MG tablet, TAKE 1-2 TABLETS BY MOUTH AT BEDTIME AS NEEDED FOR SLEEP, Disp: 180 tablet, Rfl: 0   VASCEPA 1 g CAPS, Take 2 g by mouth 2 (two) times daily., Disp: , Rfl:    divalproex (DEPAKOTE ER) 250 MG 24 hr tablet, Take 1 tablet (250 mg total) by mouth daily., Disp: 90 tablet, Rfl: 1   folic acid (FOLVITE) 1 MG tablet, Take 1 tablet (1 mg total) by mouth daily. (Patient not taking: Reported on 03/25/2022), Disp: 90 tablet, Rfl: 4   naloxone (NARCAN) nasal spray 4 mg/0.1 mL, Use 1 spray as needed (Patient not taking: Reported on  09/30/2022), Disp: 2 each, Rfl: 1   naloxone (NARCAN) nasal spray 4 mg/0.1 mL, Place 1 spray into the nose as needed, for accidental overdose. (Patient not taking: Reported on 09/30/2022), Disp: 2 each, Rfl: 1   naloxone (NARCAN) nasal spray 4 mg/0.1 mL, Place 1 spray into the nose as needed for accidental overdose (Patient not taking: Reported on 09/30/2022), Disp: 2 each, Rfl: 1   oxyCODONE-acetaminophen (PERCOCET/ROXICET) 5-325 MG tablet, Take 1 tablet by mouth every 4 (four)  hours as needed. For post op TKR, from surgeon. Doesn't take with Hydroxyzine. (Patient not taking: Reported on 03/25/2022), Disp: , Rfl:    potassium chloride SA (KLOR-CON M) 20 MEQ tablet, Take 1 tablet (20 mEq total) by mouth 2 (two) times daily for 3 days., Disp: 6 tablet, Rfl: 0   tamsulosin (FLOMAX) 0.4 MG CAPS capsule, Take 1 capsule (0.4 mg total) by mouth daily. (Patient not taking: Reported on 10/09/2020), Disp: 30 capsule, Rfl: 1   Vitamin D, Ergocalciferol, (DRISDOL) 1.25 MG (50000 UNIT) CAPS capsule, Take 50,000 Units by mouth every 7 (seven) days. (Patient not taking: Reported on 03/25/2022), Disp: , Rfl:  Medication Side Effects: none  Family Medical/ Social History: Changes? No  MENTAL HEALTH EXAM:  There were no vitals taken for this visit.There is no height or weight on file to calculate BMI.  General Appearance: Casual and Well Groomed  Eye Contact:  Good  Speech:  Clear and Coherent and Normal Rate  Volume:  Normal  Mood:  Euthymic  Affect:  Congruent  Thought Process:  Goal Directed and Descriptions of Associations: Circumstantial  Orientation:  Full (Time, Place, and Person)  Thought Content: Logical   Suicidal Thoughts:  No  Homicidal Thoughts:  No  Memory:  WNL  Judgement:  Good  Insight:  Good  Psychomotor Activity:  Normal  Concentration:  Concentration: Good and Attention Span: Good  Recall:  Good  Fund of Knowledge: Good  Language: Good  Assets:  Desire for Improvement Financial Resources/Insurance Housing Transportation  ADL's:  Intact  Cognition: WNL  Prognosis:  Good   DIAGNOSES:    ICD-10-CM   1. Episodic mood disorder (HCC)  F39 CBC with Differential/Platelet    Comprehensive metabolic panel    Valproic acid level    2. Situational anxiety  F41.8     3. Encounter for long-term (current) use of medications  Z79.899 CBC with Differential/Platelet    Comprehensive metabolic panel    Valproic acid level     Receiving Psychotherapy: Yes  With  [name]  RECOMMENDATIONS:  PDMP was reviewed.  On hydrocodone known to me. I provided 20 minutes of face to face time during this encounter, including time spent before and after the visit in records review, medical decision making, counseling pertinent to today's visit, and charting.   He is doing great so no changes will be made.  Continue Depakote ER  250 mg daily. Continue trazodone 100 mg, 1-2 nightly as needed sleep. Cont Vitamin D 1,000 iu, thiamine, multivitamin daily Continue therapy with [name]. Labs ordered as noted above.   Return in 6 months.  Donnal Moat, PA-C

## 2022-10-23 ENCOUNTER — Other Ambulatory Visit (HOSPITAL_COMMUNITY): Payer: Self-pay

## 2022-10-23 DIAGNOSIS — G894 Chronic pain syndrome: Secondary | ICD-10-CM | POA: Diagnosis not present

## 2022-10-23 DIAGNOSIS — M545 Low back pain, unspecified: Secondary | ICD-10-CM | POA: Diagnosis not present

## 2022-10-23 DIAGNOSIS — F17200 Nicotine dependence, unspecified, uncomplicated: Secondary | ICD-10-CM | POA: Diagnosis not present

## 2022-10-23 DIAGNOSIS — M1990 Unspecified osteoarthritis, unspecified site: Secondary | ICD-10-CM | POA: Diagnosis not present

## 2022-10-23 DIAGNOSIS — R030 Elevated blood-pressure reading, without diagnosis of hypertension: Secondary | ICD-10-CM | POA: Diagnosis not present

## 2022-10-23 DIAGNOSIS — Z6823 Body mass index (BMI) 23.0-23.9, adult: Secondary | ICD-10-CM | POA: Diagnosis not present

## 2022-10-23 DIAGNOSIS — Z79899 Other long term (current) drug therapy: Secondary | ICD-10-CM | POA: Diagnosis not present

## 2022-10-23 DIAGNOSIS — M069 Rheumatoid arthritis, unspecified: Secondary | ICD-10-CM | POA: Diagnosis not present

## 2022-10-23 DIAGNOSIS — F1120 Opioid dependence, uncomplicated: Secondary | ICD-10-CM | POA: Diagnosis not present

## 2022-10-23 DIAGNOSIS — Z933 Colostomy status: Secondary | ICD-10-CM | POA: Diagnosis not present

## 2022-10-23 MED ORDER — HYDROCODONE-ACETAMINOPHEN 10-325 MG PO TABS
1.0000 | ORAL_TABLET | Freq: Four times a day (QID) | ORAL | 0 refills | Status: DC | PRN
  Filled 2022-10-23: qty 120, 30d supply, fill #0

## 2022-10-24 DIAGNOSIS — Z933 Colostomy status: Secondary | ICD-10-CM | POA: Diagnosis not present

## 2022-11-05 DIAGNOSIS — M9905 Segmental and somatic dysfunction of pelvic region: Secondary | ICD-10-CM | POA: Diagnosis not present

## 2022-11-05 DIAGNOSIS — M9904 Segmental and somatic dysfunction of sacral region: Secondary | ICD-10-CM | POA: Diagnosis not present

## 2022-11-05 DIAGNOSIS — M5136 Other intervertebral disc degeneration, lumbar region: Secondary | ICD-10-CM | POA: Diagnosis not present

## 2022-11-05 DIAGNOSIS — M9903 Segmental and somatic dysfunction of lumbar region: Secondary | ICD-10-CM | POA: Diagnosis not present

## 2022-11-12 DIAGNOSIS — M9904 Segmental and somatic dysfunction of sacral region: Secondary | ICD-10-CM | POA: Diagnosis not present

## 2022-11-12 DIAGNOSIS — M9905 Segmental and somatic dysfunction of pelvic region: Secondary | ICD-10-CM | POA: Diagnosis not present

## 2022-11-12 DIAGNOSIS — M5136 Other intervertebral disc degeneration, lumbar region: Secondary | ICD-10-CM | POA: Diagnosis not present

## 2022-11-12 DIAGNOSIS — M9903 Segmental and somatic dysfunction of lumbar region: Secondary | ICD-10-CM | POA: Diagnosis not present

## 2022-11-19 DIAGNOSIS — M5136 Other intervertebral disc degeneration, lumbar region: Secondary | ICD-10-CM | POA: Diagnosis not present

## 2022-11-19 DIAGNOSIS — M9905 Segmental and somatic dysfunction of pelvic region: Secondary | ICD-10-CM | POA: Diagnosis not present

## 2022-11-19 DIAGNOSIS — M9903 Segmental and somatic dysfunction of lumbar region: Secondary | ICD-10-CM | POA: Diagnosis not present

## 2022-11-19 DIAGNOSIS — M9904 Segmental and somatic dysfunction of sacral region: Secondary | ICD-10-CM | POA: Diagnosis not present

## 2022-11-21 ENCOUNTER — Other Ambulatory Visit: Payer: Self-pay

## 2022-11-21 ENCOUNTER — Other Ambulatory Visit (HOSPITAL_COMMUNITY): Payer: Self-pay

## 2022-11-21 DIAGNOSIS — M069 Rheumatoid arthritis, unspecified: Secondary | ICD-10-CM | POA: Diagnosis not present

## 2022-11-21 DIAGNOSIS — G8929 Other chronic pain: Secondary | ICD-10-CM | POA: Diagnosis not present

## 2022-11-21 DIAGNOSIS — Z79899 Other long term (current) drug therapy: Secondary | ICD-10-CM | POA: Diagnosis not present

## 2022-11-21 DIAGNOSIS — M1990 Unspecified osteoarthritis, unspecified site: Secondary | ICD-10-CM | POA: Diagnosis not present

## 2022-11-21 DIAGNOSIS — F17200 Nicotine dependence, unspecified, uncomplicated: Secondary | ICD-10-CM | POA: Diagnosis not present

## 2022-11-21 DIAGNOSIS — M545 Low back pain, unspecified: Secondary | ICD-10-CM | POA: Diagnosis not present

## 2022-11-21 DIAGNOSIS — G894 Chronic pain syndrome: Secondary | ICD-10-CM | POA: Diagnosis not present

## 2022-11-21 DIAGNOSIS — Z6823 Body mass index (BMI) 23.0-23.9, adult: Secondary | ICD-10-CM | POA: Diagnosis not present

## 2022-11-21 DIAGNOSIS — F1120 Opioid dependence, uncomplicated: Secondary | ICD-10-CM | POA: Diagnosis not present

## 2022-11-21 DIAGNOSIS — R030 Elevated blood-pressure reading, without diagnosis of hypertension: Secondary | ICD-10-CM | POA: Diagnosis not present

## 2022-11-21 MED ORDER — HYDROCODONE-ACETAMINOPHEN 10-325 MG PO TABS
1.0000 | ORAL_TABLET | Freq: Four times a day (QID) | ORAL | 0 refills | Status: DC | PRN
  Filled 2022-11-21: qty 120, 30d supply, fill #0

## 2022-11-27 DIAGNOSIS — Z933 Colostomy status: Secondary | ICD-10-CM | POA: Diagnosis not present

## 2022-11-28 DIAGNOSIS — H47233 Glaucomatous optic atrophy, bilateral: Secondary | ICD-10-CM | POA: Diagnosis not present

## 2022-11-28 DIAGNOSIS — H40013 Open angle with borderline findings, low risk, bilateral: Secondary | ICD-10-CM | POA: Diagnosis not present

## 2022-12-02 DIAGNOSIS — M5136 Other intervertebral disc degeneration, lumbar region: Secondary | ICD-10-CM | POA: Diagnosis not present

## 2022-12-02 DIAGNOSIS — M9903 Segmental and somatic dysfunction of lumbar region: Secondary | ICD-10-CM | POA: Diagnosis not present

## 2022-12-02 DIAGNOSIS — M9904 Segmental and somatic dysfunction of sacral region: Secondary | ICD-10-CM | POA: Diagnosis not present

## 2022-12-02 DIAGNOSIS — M9905 Segmental and somatic dysfunction of pelvic region: Secondary | ICD-10-CM | POA: Diagnosis not present

## 2022-12-04 DIAGNOSIS — Z96651 Presence of right artificial knee joint: Secondary | ICD-10-CM | POA: Diagnosis not present

## 2022-12-09 DIAGNOSIS — H9190 Unspecified hearing loss, unspecified ear: Secondary | ICD-10-CM | POA: Diagnosis not present

## 2022-12-10 DIAGNOSIS — M5136 Other intervertebral disc degeneration, lumbar region: Secondary | ICD-10-CM | POA: Diagnosis not present

## 2022-12-10 DIAGNOSIS — M9903 Segmental and somatic dysfunction of lumbar region: Secondary | ICD-10-CM | POA: Diagnosis not present

## 2022-12-10 DIAGNOSIS — M9904 Segmental and somatic dysfunction of sacral region: Secondary | ICD-10-CM | POA: Diagnosis not present

## 2022-12-10 DIAGNOSIS — M9905 Segmental and somatic dysfunction of pelvic region: Secondary | ICD-10-CM | POA: Diagnosis not present

## 2022-12-13 ENCOUNTER — Other Ambulatory Visit: Payer: Self-pay | Admitting: Physician Assistant

## 2022-12-23 ENCOUNTER — Other Ambulatory Visit (HOSPITAL_COMMUNITY): Payer: Self-pay

## 2022-12-23 DIAGNOSIS — Z79899 Other long term (current) drug therapy: Secondary | ICD-10-CM | POA: Diagnosis not present

## 2022-12-23 DIAGNOSIS — F1120 Opioid dependence, uncomplicated: Secondary | ICD-10-CM | POA: Diagnosis not present

## 2022-12-23 DIAGNOSIS — G894 Chronic pain syndrome: Secondary | ICD-10-CM | POA: Diagnosis not present

## 2022-12-23 DIAGNOSIS — M069 Rheumatoid arthritis, unspecified: Secondary | ICD-10-CM | POA: Diagnosis not present

## 2022-12-23 DIAGNOSIS — M545 Low back pain, unspecified: Secondary | ICD-10-CM | POA: Diagnosis not present

## 2022-12-23 DIAGNOSIS — M1990 Unspecified osteoarthritis, unspecified site: Secondary | ICD-10-CM | POA: Diagnosis not present

## 2022-12-23 DIAGNOSIS — R030 Elevated blood-pressure reading, without diagnosis of hypertension: Secondary | ICD-10-CM | POA: Diagnosis not present

## 2022-12-23 DIAGNOSIS — F17200 Nicotine dependence, unspecified, uncomplicated: Secondary | ICD-10-CM | POA: Diagnosis not present

## 2022-12-23 DIAGNOSIS — Z6823 Body mass index (BMI) 23.0-23.9, adult: Secondary | ICD-10-CM | POA: Diagnosis not present

## 2022-12-23 MED ORDER — HYDROCODONE-ACETAMINOPHEN 10-325 MG PO TABS
1.0000 | ORAL_TABLET | Freq: Four times a day (QID) | ORAL | 0 refills | Status: DC | PRN
  Filled 2022-12-23: qty 120, 30d supply, fill #0

## 2023-01-06 DIAGNOSIS — E559 Vitamin D deficiency, unspecified: Secondary | ICD-10-CM | POA: Diagnosis not present

## 2023-01-06 DIAGNOSIS — G894 Chronic pain syndrome: Secondary | ICD-10-CM | POA: Diagnosis not present

## 2023-01-06 DIAGNOSIS — F418 Other specified anxiety disorders: Secondary | ICD-10-CM | POA: Diagnosis not present

## 2023-01-06 DIAGNOSIS — N400 Enlarged prostate without lower urinary tract symptoms: Secondary | ICD-10-CM | POA: Diagnosis not present

## 2023-01-06 DIAGNOSIS — Z933 Colostomy status: Secondary | ICD-10-CM | POA: Diagnosis not present

## 2023-01-06 DIAGNOSIS — Z6823 Body mass index (BMI) 23.0-23.9, adult: Secondary | ICD-10-CM | POA: Diagnosis not present

## 2023-01-06 DIAGNOSIS — E782 Mixed hyperlipidemia: Secondary | ICD-10-CM | POA: Diagnosis not present

## 2023-01-06 DIAGNOSIS — M1990 Unspecified osteoarthritis, unspecified site: Secondary | ICD-10-CM | POA: Diagnosis not present

## 2023-01-06 DIAGNOSIS — I10 Essential (primary) hypertension: Secondary | ICD-10-CM | POA: Diagnosis not present

## 2023-01-07 DIAGNOSIS — M0579 Rheumatoid arthritis with rheumatoid factor of multiple sites without organ or systems involvement: Secondary | ICD-10-CM | POA: Diagnosis not present

## 2023-01-07 DIAGNOSIS — R5383 Other fatigue: Secondary | ICD-10-CM | POA: Diagnosis not present

## 2023-01-07 DIAGNOSIS — M1991 Primary osteoarthritis, unspecified site: Secondary | ICD-10-CM | POA: Diagnosis not present

## 2023-01-07 DIAGNOSIS — Z6823 Body mass index (BMI) 23.0-23.9, adult: Secondary | ICD-10-CM | POA: Diagnosis not present

## 2023-01-07 DIAGNOSIS — Z79899 Other long term (current) drug therapy: Secondary | ICD-10-CM | POA: Diagnosis not present

## 2023-01-07 DIAGNOSIS — M1009 Idiopathic gout, multiple sites: Secondary | ICD-10-CM | POA: Diagnosis not present

## 2023-01-14 DIAGNOSIS — E559 Vitamin D deficiency, unspecified: Secondary | ICD-10-CM | POA: Diagnosis not present

## 2023-01-14 DIAGNOSIS — Z79899 Other long term (current) drug therapy: Secondary | ICD-10-CM | POA: Diagnosis not present

## 2023-01-14 DIAGNOSIS — E782 Mixed hyperlipidemia: Secondary | ICD-10-CM | POA: Diagnosis not present

## 2023-01-23 ENCOUNTER — Other Ambulatory Visit (HOSPITAL_COMMUNITY): Payer: Self-pay

## 2023-01-23 DIAGNOSIS — M545 Low back pain, unspecified: Secondary | ICD-10-CM | POA: Diagnosis not present

## 2023-01-23 DIAGNOSIS — R030 Elevated blood-pressure reading, without diagnosis of hypertension: Secondary | ICD-10-CM | POA: Diagnosis not present

## 2023-01-23 DIAGNOSIS — F17200 Nicotine dependence, unspecified, uncomplicated: Secondary | ICD-10-CM | POA: Diagnosis not present

## 2023-01-23 DIAGNOSIS — F1120 Opioid dependence, uncomplicated: Secondary | ICD-10-CM | POA: Diagnosis not present

## 2023-01-23 DIAGNOSIS — G8929 Other chronic pain: Secondary | ICD-10-CM | POA: Diagnosis not present

## 2023-01-23 DIAGNOSIS — Z6822 Body mass index (BMI) 22.0-22.9, adult: Secondary | ICD-10-CM | POA: Diagnosis not present

## 2023-01-23 DIAGNOSIS — M1990 Unspecified osteoarthritis, unspecified site: Secondary | ICD-10-CM | POA: Diagnosis not present

## 2023-01-23 DIAGNOSIS — Z79899 Other long term (current) drug therapy: Secondary | ICD-10-CM | POA: Diagnosis not present

## 2023-01-23 DIAGNOSIS — M069 Rheumatoid arthritis, unspecified: Secondary | ICD-10-CM | POA: Diagnosis not present

## 2023-01-23 MED ORDER — HYDROCODONE-ACETAMINOPHEN 10-325 MG PO TABS
1.0000 | ORAL_TABLET | Freq: Four times a day (QID) | ORAL | 0 refills | Status: AC | PRN
  Filled 2023-01-23: qty 120, 30d supply, fill #0

## 2023-01-26 DIAGNOSIS — E876 Hypokalemia: Secondary | ICD-10-CM | POA: Diagnosis not present

## 2023-02-02 DIAGNOSIS — E785 Hyperlipidemia, unspecified: Secondary | ICD-10-CM | POA: Diagnosis not present

## 2023-02-02 DIAGNOSIS — I480 Paroxysmal atrial fibrillation: Secondary | ICD-10-CM | POA: Diagnosis not present

## 2023-02-02 DIAGNOSIS — I358 Other nonrheumatic aortic valve disorders: Secondary | ICD-10-CM | POA: Diagnosis not present

## 2023-02-02 DIAGNOSIS — I10 Essential (primary) hypertension: Secondary | ICD-10-CM | POA: Diagnosis not present

## 2023-02-17 DIAGNOSIS — M9904 Segmental and somatic dysfunction of sacral region: Secondary | ICD-10-CM | POA: Diagnosis not present

## 2023-02-17 DIAGNOSIS — M5136 Other intervertebral disc degeneration, lumbar region: Secondary | ICD-10-CM | POA: Diagnosis not present

## 2023-02-17 DIAGNOSIS — M9903 Segmental and somatic dysfunction of lumbar region: Secondary | ICD-10-CM | POA: Diagnosis not present

## 2023-02-17 DIAGNOSIS — M9901 Segmental and somatic dysfunction of cervical region: Secondary | ICD-10-CM | POA: Diagnosis not present

## 2023-02-23 ENCOUNTER — Other Ambulatory Visit (HOSPITAL_COMMUNITY): Payer: Self-pay

## 2023-02-23 DIAGNOSIS — G894 Chronic pain syndrome: Secondary | ICD-10-CM | POA: Diagnosis not present

## 2023-02-23 DIAGNOSIS — M545 Low back pain, unspecified: Secondary | ICD-10-CM | POA: Diagnosis not present

## 2023-02-23 DIAGNOSIS — M1990 Unspecified osteoarthritis, unspecified site: Secondary | ICD-10-CM | POA: Diagnosis not present

## 2023-02-23 DIAGNOSIS — R030 Elevated blood-pressure reading, without diagnosis of hypertension: Secondary | ICD-10-CM | POA: Diagnosis not present

## 2023-02-23 DIAGNOSIS — M069 Rheumatoid arthritis, unspecified: Secondary | ICD-10-CM | POA: Diagnosis not present

## 2023-02-23 DIAGNOSIS — F1120 Opioid dependence, uncomplicated: Secondary | ICD-10-CM | POA: Diagnosis not present

## 2023-02-23 DIAGNOSIS — F17200 Nicotine dependence, unspecified, uncomplicated: Secondary | ICD-10-CM | POA: Diagnosis not present

## 2023-02-23 DIAGNOSIS — Z6822 Body mass index (BMI) 22.0-22.9, adult: Secondary | ICD-10-CM | POA: Diagnosis not present

## 2023-02-23 DIAGNOSIS — Z9181 History of falling: Secondary | ICD-10-CM | POA: Diagnosis not present

## 2023-02-23 DIAGNOSIS — Z79899 Other long term (current) drug therapy: Secondary | ICD-10-CM | POA: Diagnosis not present

## 2023-02-23 MED ORDER — HYDROCODONE-ACETAMINOPHEN 10-325 MG PO TABS
1.0000 | ORAL_TABLET | Freq: Four times a day (QID) | ORAL | 0 refills | Status: AC | PRN
  Filled 2023-02-23: qty 120, 30d supply, fill #0

## 2023-02-24 DIAGNOSIS — M5136 Other intervertebral disc degeneration, lumbar region: Secondary | ICD-10-CM | POA: Diagnosis not present

## 2023-02-24 DIAGNOSIS — M9904 Segmental and somatic dysfunction of sacral region: Secondary | ICD-10-CM | POA: Diagnosis not present

## 2023-02-24 DIAGNOSIS — M9901 Segmental and somatic dysfunction of cervical region: Secondary | ICD-10-CM | POA: Diagnosis not present

## 2023-02-24 DIAGNOSIS — M9903 Segmental and somatic dysfunction of lumbar region: Secondary | ICD-10-CM | POA: Diagnosis not present

## 2023-03-12 DIAGNOSIS — Z933 Colostomy status: Secondary | ICD-10-CM | POA: Diagnosis not present

## 2023-03-18 ENCOUNTER — Other Ambulatory Visit: Payer: Self-pay | Admitting: Physician Assistant

## 2023-03-26 ENCOUNTER — Other Ambulatory Visit (HOSPITAL_COMMUNITY): Payer: Self-pay

## 2023-03-26 DIAGNOSIS — G8929 Other chronic pain: Secondary | ICD-10-CM | POA: Diagnosis not present

## 2023-03-26 DIAGNOSIS — M069 Rheumatoid arthritis, unspecified: Secondary | ICD-10-CM | POA: Diagnosis not present

## 2023-03-26 DIAGNOSIS — F17200 Nicotine dependence, unspecified, uncomplicated: Secondary | ICD-10-CM | POA: Diagnosis not present

## 2023-03-26 DIAGNOSIS — I10 Essential (primary) hypertension: Secondary | ICD-10-CM | POA: Diagnosis not present

## 2023-03-26 DIAGNOSIS — Z6822 Body mass index (BMI) 22.0-22.9, adult: Secondary | ICD-10-CM | POA: Diagnosis not present

## 2023-03-26 DIAGNOSIS — F1120 Opioid dependence, uncomplicated: Secondary | ICD-10-CM | POA: Diagnosis not present

## 2023-03-26 DIAGNOSIS — Z9181 History of falling: Secondary | ICD-10-CM | POA: Diagnosis not present

## 2023-03-26 DIAGNOSIS — R030 Elevated blood-pressure reading, without diagnosis of hypertension: Secondary | ICD-10-CM | POA: Diagnosis not present

## 2023-03-26 DIAGNOSIS — Z79899 Other long term (current) drug therapy: Secondary | ICD-10-CM | POA: Diagnosis not present

## 2023-03-26 MED ORDER — HYDROCODONE-ACETAMINOPHEN 10-325 MG PO TABS
1.0000 | ORAL_TABLET | Freq: Four times a day (QID) | ORAL | 0 refills | Status: AC | PRN
  Filled 2023-03-26 (×2): qty 120, 30d supply, fill #0

## 2023-03-27 ENCOUNTER — Other Ambulatory Visit (HOSPITAL_COMMUNITY): Payer: Self-pay

## 2023-03-28 ENCOUNTER — Other Ambulatory Visit: Payer: Self-pay | Admitting: Physician Assistant

## 2023-03-29 NOTE — Telephone Encounter (Signed)
Has appt 9/11.

## 2023-04-01 ENCOUNTER — Encounter: Payer: Self-pay | Admitting: Physician Assistant

## 2023-04-01 ENCOUNTER — Ambulatory Visit (INDEPENDENT_AMBULATORY_CARE_PROVIDER_SITE_OTHER): Payer: Medicare PPO | Admitting: Physician Assistant

## 2023-04-01 DIAGNOSIS — Z79899 Other long term (current) drug therapy: Secondary | ICD-10-CM | POA: Diagnosis not present

## 2023-04-01 DIAGNOSIS — F39 Unspecified mood [affective] disorder: Secondary | ICD-10-CM

## 2023-04-01 DIAGNOSIS — Z789 Other specified health status: Secondary | ICD-10-CM | POA: Diagnosis not present

## 2023-04-01 DIAGNOSIS — F418 Other specified anxiety disorders: Secondary | ICD-10-CM | POA: Diagnosis not present

## 2023-04-01 MED ORDER — DIVALPROEX SODIUM ER 250 MG PO TB24: 250.0000 mg | ORAL_TABLET | Freq: Every day | ORAL | 1 refills | Status: AC

## 2023-04-01 MED ORDER — ALPRAZOLAM 0.5 MG PO TABS: 0.5000 mg | ORAL_TABLET | Freq: Two times a day (BID) | ORAL | 0 refills | Status: DC | PRN

## 2023-04-01 NOTE — Progress Notes (Unsigned)
[facility]  Patient ID: Eddie Taylor,  MRN: [id number]  PCP: Lucianne Lei, MD  Date of Evaluation: 04/01/2023 Time spent:30 minutes  Chief Complaint:  Chief Complaint   Anxiety; Depression; Follow-up    HISTORY/CURRENT STATUS: HPI For routine med check.  Stressed, marital issues, he and wife saw a marriage counselor who happens to be her Veterinary surgeon. He felt ambushed. He got defensive. He does most of the housework, she controls all the money and gives him and allowance. But 'we'll get through it.' Married 48 years so "I'm not going anywhere." Does get really anxious when they're arguing a lot. Has taken xanax in the past and it's helped when needed. He doesn't have PA but gets overwhelmed and has to walk away. No palpitations or tachycardia. No SOB. He just gets irritable when he feels attacked, but after he walks away for a little while, he feels better.   Patient is able to enjoy things.  Energy and motivation are good.  No extreme sadness, tearfulness, or feelings of hopelessness.  Sleeps well.  ADLs and personal hygiene are normal.   Denies any changes in concentration, making decisions, or remembering things.  Appetite has not changed.  Weight is stable.  Denies suicidal or homicidal thoughts.  Patient denies increased energy with decreased need for sleep, increased talkativeness, racing thoughts, impulsivity or risky behaviors, increased spending, increased libido, grandiosity, paranoia, or hallucinations.  Review of Systems  Constitutional: Negative.   HENT: Negative.    Eyes: Negative.   Respiratory: Negative.    Cardiovascular: Negative.   Gastrointestinal: Negative.   Genitourinary: Negative.   Musculoskeletal: Negative.   Skin: Negative.   Neurological: Negative.   Endo/Heme/Allergies: Negative.   Psychiatric/Behavioral:         See HPI   Individual Medical History/ Review of Systems: Changes? :No     Past medications for mental health diagnoses  include: Wellbutrin, Elavil, Effexor, Ambien, Gabapentin, Xanax, Sonata, Lunesta,   Allergies: Bupropion, Fluoxetine, Hydroxychloroquine, Morphine and codeine, Prozac [fluoxetine hcl], and Sulfa antibiotics  Current Medications:  Current Outpatient Medications:    Adalimumab 40 MG/0.8ML PSKT, Inject 40 mg into the skin once a week., Disp: , Rfl:    allopurinol (ZYLOPRIM) 300 MG tablet, Take 300 mg by mouth every evening., Disp: , Rfl:    ALPRAZolam (XANAX) 0.5 MG tablet, Take 1 tablet (0.5 mg total) by mouth 2 (two) times daily as needed for anxiety., Disp: 30 tablet, Rfl: 0   aspirin EC 81 MG tablet, Take 81 mg by mouth daily. Swallow whole., Disp: , Rfl:    esomeprazole (NEXIUM) 40 MG capsule, Take 40 mg by mouth 2 (two) times daily before a meal., Disp: , Rfl:    HYDROcodone-acetaminophen (NORCO) 10-325 MG tablet, Take 1 tablet by mouth every 6 (six) hours as needed for moderate pain or severe pain., Disp: , Rfl:    HYDROcodone-acetaminophen (NORCO) 10-325 MG tablet, Take 1 tablet by mouth 4 (four) times daily as needed., Disp: 120 tablet, Rfl: 0   HYDROcodone-acetaminophen (NORCO) 10-325 MG tablet, Take 1 tablet by mouth 4 (four) times daily as needed., Disp: 120 tablet, Rfl: 0   HYDROcodone-acetaminophen (NORCO) 10-325 MG tablet, Take 1 tablet by mouth 4 (four) times daily as needed., Disp: 120 tablet, Rfl: 0   lisinopril (ZESTRIL) 10 MG tablet, Take 10 mg by mouth daily., Disp: , Rfl:    Magnesium Oxide 400 MG CAPS, Take 2 capsules (800 mg total) by mouth daily., Disp: 60 capsule, Rfl: 1  potassium chloride SA (KLOR-CON M) 20 MEQ tablet, Take 10 mEq by mouth once., Disp: , Rfl:    pravastatin (PRAVACHOL) 40 MG tablet, Take 40 mg by mouth every evening., Disp: , Rfl:    traZODone (DESYREL) 100 MG tablet, TAKE 1-2 TABLETS BY MOUTH AT BEDTIME AS NEEDED FOR SLEEP, Disp: 180 tablet, Rfl: 0   VASCEPA 1 g CAPS, Take 2 g by mouth 2 (two) times daily., Disp: , Rfl:    divalproex (DEPAKOTE ER) 250  MG 24 hr tablet, Take 1 tablet (250 mg total) by mouth daily., Disp: 90 tablet, Rfl: 1   folic acid (FOLVITE) 1 MG tablet, Take 1 tablet (1 mg total) by mouth daily. (Patient not taking: Reported on 03/25/2022), Disp: 90 tablet, Rfl: 4   naloxone (NARCAN) nasal spray 4 mg/0.1 mL, Use 1 spray as needed (Patient not taking: Reported on 09/30/2022), Disp: 2 each, Rfl: 1   naloxone (NARCAN) nasal spray 4 mg/0.1 mL, Place 1 spray into the nose as needed, for accidental overdose. (Patient not taking: Reported on 09/30/2022), Disp: 2 each, Rfl: 1   naloxone (NARCAN) nasal spray 4 mg/0.1 mL, Place 1 spray into the nose as needed for accidental overdose (Patient not taking: Reported on 09/30/2022), Disp: 2 each, Rfl: 1   oxyCODONE-acetaminophen (PERCOCET/ROXICET) 5-325 MG tablet, Take 1 tablet by mouth every 4 (four) hours as needed. For post op TKR, from surgeon. Doesn't take with Hydroxyzine. (Patient not taking: Reported on 03/25/2022), Disp: , Rfl:    potassium chloride SA (KLOR-CON M) 20 MEQ tablet, Take 1 tablet (20 mEq total) by mouth 2 (two) times daily for 3 days., Disp: 6 tablet, Rfl: 0   tamsulosin (FLOMAX) 0.4 MG CAPS capsule, Take 1 capsule (0.4 mg total) by mouth daily. (Patient not taking: Reported on 10/09/2020), Disp: 30 capsule, Rfl: 1   thiamine 100 MG tablet, Take 1 tablet (100 mg total) by mouth daily. (Patient not taking: Reported on 04/01/2023), Disp: 30 tablet, Rfl: 1   Vitamin D, Ergocalciferol, (DRISDOL) 1.25 MG (50000 UNIT) CAPS capsule, Take 50,000 Units by mouth every 7 (seven) days. (Patient not taking: Reported on 03/25/2022), Disp: , Rfl:  Medication Side Effects: none  Family Medical/ Social History: Changes? No  MENTAL HEALTH EXAM:  There were no vitals taken for this visit.There is no height or weight on file to calculate BMI.  General Appearance: Casual and Well Groomed  Eye Contact:  Good  Speech:  Clear and Coherent and Normal Rate  Volume:  Normal  Mood:  Euthymic  Affect:   Congruent  Thought Process:  Goal Directed and Descriptions of Associations: Circumstantial  Orientation:  Full (Time, Place, and Person)  Thought Content: Logical   Suicidal Thoughts:  No  Homicidal Thoughts:  No  Memory:  WNL  Judgement:  Good  Insight:  Good  Psychomotor Activity:  Normal  Concentration:  Concentration: Good and Attention Span: Good  Recall:  Good  Fund of Knowledge: Good  Language: Good  Assets:  Desire for Improvement Financial Resources/Insurance Housing Resilience Transportation  ADL's:  Intact  Cognition: WNL  Prognosis:  Good   01/14/2023 at PCP, [facility]:  Total cholesterol 137, triglycerides 110, HDL 61, LDL 56, vitamin D 85, glucose 87, potassium 3.3,  hemoglobin 11.7 and hematocrit 35.3, platelets 132,  Depakote level 10,  hemoglobin A1c 5.1   DIAGNOSES:    ICD-10-CM   1. Episodic mood disorder (HCC)  F39     2. Situational anxiety  F41.8  3. Alcohol use  Z78.9       Receiving Psychotherapy: Yes  With Burney Gauze  RECOMMENDATIONS:  PDMP was reviewed.  On hydrocodone known to me. I provided 30 minutes of face to face time during this encounter, including time spent before and after the visit in records review, medical decision making, counseling pertinent to today's visit, and charting.   Discussed the anxiety and ways to help himself. Recommend adding Xanax in for prn use. Try not to take often, not even daily if he can get by without it. Cont coping skills learned in therapy to help with anxiety as well.    We discussed the short-term and long-term risks of benzodiazepines including sedation, increased risk of falling, dizziness, tolerance, and addictive potential.  Patient understands and accepts.   Start Xanax 0.5 mg, 1 po bid prn anxiety.  Continue Depakote ER  250 mg daily. Continue trazodone 100 mg, 1-2 nightly as needed sleep. Cont Vitamin D 1,000 iu, thiamine, multivitamin daily Continue therapy with  Burney Gauze. Return in 2 months.  Melony Overly, PA-C

## 2023-04-03 ENCOUNTER — Telehealth: Payer: Self-pay | Admitting: Physician Assistant

## 2023-04-03 NOTE — Telephone Encounter (Signed)
PT called to notify office that the new med- Xanax - is needing a PA, or quantity approval.

## 2023-04-05 NOTE — Telephone Encounter (Signed)
PA in [facility]

## 2023-04-07 ENCOUNTER — Telehealth: Payer: Self-pay | Admitting: Physician Assistant

## 2023-04-07 NOTE — Telephone Encounter (Signed)
Wife called at 1:57 to check status of PA for Eddie Taylor Xanax.  I told her we had the request and that it could take up to 3 days to get a response and we only received yesterday.  She had called the insurance co and they told her they had received anything from [place].  I told her she was not going to be able to get correct information on PA's from customer service and that we would update her when we had received a determination.

## 2023-04-08 DIAGNOSIS — I10 Essential (primary) hypertension: Secondary | ICD-10-CM | POA: Diagnosis not present

## 2023-04-08 DIAGNOSIS — E559 Vitamin D deficiency, unspecified: Secondary | ICD-10-CM | POA: Diagnosis not present

## 2023-04-08 DIAGNOSIS — M1990 Unspecified osteoarthritis, unspecified site: Secondary | ICD-10-CM | POA: Diagnosis not present

## 2023-04-08 DIAGNOSIS — N400 Enlarged prostate without lower urinary tract symptoms: Secondary | ICD-10-CM | POA: Diagnosis not present

## 2023-04-08 DIAGNOSIS — E782 Mixed hyperlipidemia: Secondary | ICD-10-CM | POA: Diagnosis not present

## 2023-04-08 DIAGNOSIS — Z6823 Body mass index (BMI) 23.0-23.9, adult: Secondary | ICD-10-CM | POA: Diagnosis not present

## 2023-04-08 NOTE — Telephone Encounter (Signed)
Pt is on hydrocodone so that is the issue of pt staying on both medications and why due to the risks. Will give diagnosis and explain benefit outweighs risk.

## 2023-04-13 ENCOUNTER — Telehealth: Payer: Self-pay | Admitting: Physician Assistant

## 2023-04-13 NOTE — Telephone Encounter (Signed)
Spoke with [facility] and they will fax with his determination  Reference # [id number]

## 2023-04-13 NOTE — Telephone Encounter (Signed)
[employer] called at 1:58 wanting to get information on the PA for Xanax due to the risk of taking this med and the hydrocodone.  Please call them to clear this matter [phone]

## 2023-04-14 DIAGNOSIS — Z933 Colostomy status: Secondary | ICD-10-CM | POA: Diagnosis not present

## 2023-04-14 DIAGNOSIS — Z1211 Encounter for screening for malignant neoplasm of colon: Secondary | ICD-10-CM | POA: Diagnosis not present

## 2023-04-15 ENCOUNTER — Telehealth: Payer: Self-pay | Admitting: Physician Assistant

## 2023-04-15 NOTE — Telephone Encounter (Signed)
Noted will discuss with Eddie Taylor in more detail and the issues his insurance has with him taking it. Trying to do a PA for him was very difficult due to him being on a pain medication. Instructed PA rep that the medication was #30 for 30 days but how the instructions were she kept saying 15 days.

## 2023-04-15 NOTE — Telephone Encounter (Signed)
See other phone message

## 2023-04-15 NOTE — Telephone Encounter (Signed)
Eddie Taylor called and thanked you for getting his medication straight with the insurance, yet he just #15 tablets.  Was there a reason he only got #15?

## 2023-04-16 NOTE — Telephone Encounter (Signed)
We talked about him using it for emergencies. So no more than 30 pills and that should last him at least a month or more.

## 2023-04-16 NOTE — Telephone Encounter (Signed)
Clarification, pt did get #30 tablets but I guess he's thinking if taking consistently twice a day he only has 15 day supply. I assume he shouldn't necessary need twice a day and this is a trial to see how he does with it.  Will contact pt to explain to patient.

## 2023-04-16 NOTE — Telephone Encounter (Signed)
Noted thank you

## 2023-04-20 DIAGNOSIS — M9903 Segmental and somatic dysfunction of lumbar region: Secondary | ICD-10-CM | POA: Diagnosis not present

## 2023-04-20 DIAGNOSIS — M5136 Other intervertebral disc degeneration, lumbar region: Secondary | ICD-10-CM | POA: Diagnosis not present

## 2023-04-20 DIAGNOSIS — M9901 Segmental and somatic dysfunction of cervical region: Secondary | ICD-10-CM | POA: Diagnosis not present

## 2023-04-20 DIAGNOSIS — M9904 Segmental and somatic dysfunction of sacral region: Secondary | ICD-10-CM | POA: Diagnosis not present

## 2023-04-23 ENCOUNTER — Other Ambulatory Visit (HOSPITAL_COMMUNITY): Payer: Self-pay

## 2023-04-23 DIAGNOSIS — Z6822 Body mass index (BMI) 22.0-22.9, adult: Secondary | ICD-10-CM | POA: Diagnosis not present

## 2023-04-23 DIAGNOSIS — Z79899 Other long term (current) drug therapy: Secondary | ICD-10-CM | POA: Diagnosis not present

## 2023-04-23 DIAGNOSIS — I10 Essential (primary) hypertension: Secondary | ICD-10-CM | POA: Diagnosis not present

## 2023-04-23 DIAGNOSIS — M545 Low back pain, unspecified: Secondary | ICD-10-CM | POA: Diagnosis not present

## 2023-04-23 DIAGNOSIS — G8929 Other chronic pain: Secondary | ICD-10-CM | POA: Diagnosis not present

## 2023-04-23 MED ORDER — HYDROCODONE-ACETAMINOPHEN 10-325 MG PO TABS
1.0000 | ORAL_TABLET | Freq: Four times a day (QID) | ORAL | 0 refills | Status: AC | PRN
Start: 2023-04-23 — End: ?
  Filled 2023-04-23: qty 120, 30d supply, fill #0

## 2023-04-25 ENCOUNTER — Other Ambulatory Visit: Payer: Self-pay | Admitting: Physician Assistant

## 2023-04-26 NOTE — Telephone Encounter (Signed)
Approved Effective until 07/21/23

## 2023-04-26 NOTE — Telephone Encounter (Signed)
Due 10/10 

## 2023-04-29 DIAGNOSIS — M9903 Segmental and somatic dysfunction of lumbar region: Secondary | ICD-10-CM | POA: Diagnosis not present

## 2023-04-29 DIAGNOSIS — M9905 Segmental and somatic dysfunction of pelvic region: Secondary | ICD-10-CM | POA: Diagnosis not present

## 2023-04-29 DIAGNOSIS — M5136 Other intervertebral disc degeneration, lumbar region with discogenic back pain only: Secondary | ICD-10-CM | POA: Diagnosis not present

## 2023-04-29 DIAGNOSIS — M9904 Segmental and somatic dysfunction of sacral region: Secondary | ICD-10-CM | POA: Diagnosis not present

## 2023-04-29 NOTE — Telephone Encounter (Signed)
RF due tomorrow; LF 04/02/23

## 2023-04-29 NOTE — Telephone Encounter (Signed)
Pt called about RF - says he is out. Can we send in?

## 2023-05-07 DIAGNOSIS — M9903 Segmental and somatic dysfunction of lumbar region: Secondary | ICD-10-CM | POA: Diagnosis not present

## 2023-05-07 DIAGNOSIS — M9904 Segmental and somatic dysfunction of sacral region: Secondary | ICD-10-CM | POA: Diagnosis not present

## 2023-05-07 DIAGNOSIS — M9905 Segmental and somatic dysfunction of pelvic region: Secondary | ICD-10-CM | POA: Diagnosis not present

## 2023-05-07 DIAGNOSIS — M5136 Other intervertebral disc degeneration, lumbar region with discogenic back pain only: Secondary | ICD-10-CM | POA: Diagnosis not present

## 2023-05-19 ENCOUNTER — Other Ambulatory Visit: Payer: Self-pay | Admitting: Physician Assistant

## 2023-05-21 ENCOUNTER — Telehealth: Payer: Self-pay

## 2023-05-21 NOTE — Telephone Encounter (Signed)
Yes, you're right. I'll disc w/ him at next OV.

## 2023-05-22 ENCOUNTER — Other Ambulatory Visit (HOSPITAL_COMMUNITY): Payer: Self-pay

## 2023-05-22 DIAGNOSIS — F1120 Opioid dependence, uncomplicated: Secondary | ICD-10-CM | POA: Diagnosis not present

## 2023-05-22 DIAGNOSIS — R030 Elevated blood-pressure reading, without diagnosis of hypertension: Secondary | ICD-10-CM | POA: Diagnosis not present

## 2023-05-22 DIAGNOSIS — Z6822 Body mass index (BMI) 22.0-22.9, adult: Secondary | ICD-10-CM | POA: Diagnosis not present

## 2023-05-22 DIAGNOSIS — Z9181 History of falling: Secondary | ICD-10-CM | POA: Diagnosis not present

## 2023-05-22 DIAGNOSIS — I10 Essential (primary) hypertension: Secondary | ICD-10-CM | POA: Diagnosis not present

## 2023-05-22 DIAGNOSIS — M545 Low back pain, unspecified: Secondary | ICD-10-CM | POA: Diagnosis not present

## 2023-05-22 DIAGNOSIS — Z79899 Other long term (current) drug therapy: Secondary | ICD-10-CM | POA: Diagnosis not present

## 2023-05-22 DIAGNOSIS — M069 Rheumatoid arthritis, unspecified: Secondary | ICD-10-CM | POA: Diagnosis not present

## 2023-05-22 DIAGNOSIS — G8929 Other chronic pain: Secondary | ICD-10-CM | POA: Diagnosis not present

## 2023-05-22 DIAGNOSIS — Z0000 Encounter for general adult medical examination without abnormal findings: Secondary | ICD-10-CM | POA: Diagnosis not present

## 2023-05-22 DIAGNOSIS — G894 Chronic pain syndrome: Secondary | ICD-10-CM | POA: Diagnosis not present

## 2023-05-22 MED ORDER — HYDROCODONE-ACETAMINOPHEN 10-325 MG PO TABS
1.0000 | ORAL_TABLET | Freq: Four times a day (QID) | ORAL | 0 refills | Status: AC | PRN
Start: 2023-05-22 — End: ?
  Filled 2023-05-22: qty 120, 30d supply, fill #0

## 2023-05-22 NOTE — Telephone Encounter (Signed)
LVM to [facility] 

## 2023-05-22 NOTE — Telephone Encounter (Signed)
Notified patient that he was only prescribed 30 tablets for emergencies and that it was supposed to last him a month, so he isn't due for a RF yet.   Wife called later and said she had not heard anything back. Told her I had talked to patient. Provided the same information to her.

## 2023-06-01 ENCOUNTER — Ambulatory Visit: Payer: Medicare PPO | Admitting: Physician Assistant

## 2023-06-16 ENCOUNTER — Other Ambulatory Visit: Payer: Self-pay

## 2023-06-16 ENCOUNTER — Other Ambulatory Visit (HOSPITAL_COMMUNITY): Payer: Self-pay

## 2023-06-16 DIAGNOSIS — G894 Chronic pain syndrome: Secondary | ICD-10-CM | POA: Diagnosis not present

## 2023-06-16 DIAGNOSIS — I10 Essential (primary) hypertension: Secondary | ICD-10-CM | POA: Diagnosis not present

## 2023-06-16 DIAGNOSIS — R030 Elevated blood-pressure reading, without diagnosis of hypertension: Secondary | ICD-10-CM | POA: Diagnosis not present

## 2023-06-16 DIAGNOSIS — F1120 Opioid dependence, uncomplicated: Secondary | ICD-10-CM | POA: Diagnosis not present

## 2023-06-16 DIAGNOSIS — Z6822 Body mass index (BMI) 22.0-22.9, adult: Secondary | ICD-10-CM | POA: Diagnosis not present

## 2023-06-16 DIAGNOSIS — M545 Low back pain, unspecified: Secondary | ICD-10-CM | POA: Diagnosis not present

## 2023-06-16 DIAGNOSIS — Z79899 Other long term (current) drug therapy: Secondary | ICD-10-CM | POA: Diagnosis not present

## 2023-06-16 DIAGNOSIS — M069 Rheumatoid arthritis, unspecified: Secondary | ICD-10-CM | POA: Diagnosis not present

## 2023-06-16 DIAGNOSIS — Z9181 History of falling: Secondary | ICD-10-CM | POA: Diagnosis not present

## 2023-06-16 MED ORDER — HYDROCODONE-ACETAMINOPHEN 10-325 MG PO TABS
1.0000 | ORAL_TABLET | Freq: Four times a day (QID) | ORAL | 0 refills | Status: DC | PRN
Start: 2023-06-16 — End: 2023-07-28
  Filled 2023-06-16 – 2023-06-19 (×2): qty 120, 30d supply, fill #0

## 2023-06-17 ENCOUNTER — Ambulatory Visit: Payer: Medicare PPO | Admitting: Physician Assistant

## 2023-06-17 ENCOUNTER — Encounter: Payer: Self-pay | Admitting: Physician Assistant

## 2023-06-17 DIAGNOSIS — Z630 Problems in relationship with spouse or partner: Secondary | ICD-10-CM | POA: Diagnosis not present

## 2023-06-17 DIAGNOSIS — G4700 Insomnia, unspecified: Secondary | ICD-10-CM

## 2023-06-17 DIAGNOSIS — F418 Other specified anxiety disorders: Secondary | ICD-10-CM

## 2023-06-17 DIAGNOSIS — F39 Unspecified mood [affective] disorder: Secondary | ICD-10-CM

## 2023-06-17 MED ORDER — ALPRAZOLAM 0.5 MG PO TABS: 0.5000 mg | ORAL_TABLET | Freq: Two times a day (BID) | ORAL | 0 refills | Status: DC | PRN

## 2023-06-17 NOTE — Progress Notes (Signed)
[facility]  Patient ID: Eddie Taylor,  MRN: [id number]  PCP: Lucianne Lei, MD  Date of Evaluation: 06/17/2023 Time spent:25 minutes  Chief Complaint:  Chief Complaint   Anxiety; Depression; Follow-up; Insomnia    HISTORY/CURRENT STATUS: HPI For routine med check.  Under a lot stress b/c of the holidays, it's hard especially b/c his wife is more sad. She lost several fm members this time of year and it's hard for her. They're still having marital problems. In marriage counseling now.   Patient is able to enjoy things.  Energy and motivation are good.   No extreme sadness, tearfulness, or feelings of hopelessness.  Sleeps well most of the time. ADLs and personal hygiene are normal.   Denies any changes in concentration, making decisions, or remembering things.  Appetite has not changed.  Weight is stable.  Denies suicidal or homicidal thoughts.  The xanax has been helpful. He still gets overwhelmed, feels uneasy like something bad might happen. No PA.  Patient denies increased energy with decreased need for sleep, increased talkativeness, racing thoughts, impulsivity or risky behaviors, increased spending, increased libido, grandiosity, increased irritability or anger, paranoia, or hallucinations.   Denies dizziness, syncope, seizures, numbness, tingling, tremor, tics, unsteady gait, slurred speech, confusion. Denies muscle or joint pain, stiffness, or dystonia.  Individual Medical History/ Review of Systems: Changes? :No     Past medications for mental health diagnoses include: Wellbutrin, Elavil, Effexor, Ambien, Gabapentin, Xanax, Sonata, Lunesta,   Allergies: Bupropion, Fluoxetine, Hydroxychloroquine, Morphine and codeine, Prozac [fluoxetine hcl], and Sulfa antibiotics  Current Medications:  Current Outpatient Medications:    Adalimumab 40 MG/0.8ML PSKT, Inject 40 mg into the skin once a week., Disp: , Rfl:    allopurinol (ZYLOPRIM) 300 MG tablet, Take 300 mg by  mouth every evening., Disp: , Rfl:    aspirin EC 81 MG tablet, Take 81 mg by mouth daily. Swallow whole., Disp: , Rfl:    divalproex (DEPAKOTE ER) 250 MG 24 hr tablet, Take 1 tablet (250 mg total) by mouth daily., Disp: 90 tablet, Rfl: 1   esomeprazole (NEXIUM) 40 MG capsule, Take 40 mg by mouth 2 (two) times daily before a meal., Disp: , Rfl:    HYDROcodone-acetaminophen (NORCO) 10-325 MG tablet, Take 1 tablet by mouth every 6 (six) hours as needed for moderate pain or severe pain., Disp: , Rfl:    HYDROcodone-acetaminophen (NORCO) 10-325 MG tablet, Take 1 tablet by mouth 4 (four) times daily as needed., Disp: 120 tablet, Rfl: 0   HYDROcodone-acetaminophen (NORCO) 10-325 MG tablet, Take 1 tablet by mouth 4 (four) times daily as needed., Disp: 120 tablet, Rfl: 0   HYDROcodone-acetaminophen (NORCO) 10-325 MG tablet, Take 1 tablet by mouth 4 (four) times daily as needed., Disp: 120 tablet, Rfl: 0   HYDROcodone-acetaminophen (NORCO) 10-325 MG tablet, Take 1 tablet by mouth 4 (four) times daily as needed., Disp: 120 tablet, Rfl: 0   HYDROcodone-acetaminophen (NORCO) 10-325 MG tablet, Take 1 tablet by mouth 4 (four) times daily as needed., Disp: 120 tablet, Rfl: 0   HYDROcodone-acetaminophen (NORCO) 10-325 MG tablet, Take 1 tablet by mouth 4 (four) times daily as needed., Disp: 120 tablet, Rfl: 0   lisinopril (ZESTRIL) 10 MG tablet, Take 10 mg by mouth daily., Disp: , Rfl:    Magnesium Oxide 400 MG CAPS, Take 2 capsules (800 mg total) by mouth daily., Disp: 60 capsule, Rfl: 1   potassium chloride SA (KLOR-CON M) 20 MEQ tablet, Take 10 mEq by mouth once., Disp: ,  Rfl:    pravastatin (PRAVACHOL) 40 MG tablet, Take 40 mg by mouth every evening., Disp: , Rfl:    thiamine 100 MG tablet, Take 1 tablet (100 mg total) by mouth daily., Disp: 30 tablet, Rfl: 1   traZODone (DESYREL) 100 MG tablet, TAKE 1-2 TABLETS BY MOUTH AT BEDTIME AS NEEDED FOR SLEEP, Disp: 180 tablet, Rfl: 0   VASCEPA 1 g CAPS, Take 2 g by  mouth 2 (two) times daily., Disp: , Rfl:    ALPRAZolam (XANAX) 0.5 MG tablet, Take 1 tablet (0.5 mg total) by mouth 2 (two) times daily as needed. Must last 30 days., Disp: 45 tablet, Rfl: 0   folic acid (FOLVITE) 1 MG tablet, Take 1 tablet (1 mg total) by mouth daily. (Patient not taking: Reported on 03/25/2022), Disp: 90 tablet, Rfl: 4   naloxone (NARCAN) nasal spray 4 mg/0.1 mL, Use 1 spray as needed (Patient not taking: Reported on 09/30/2022), Disp: 2 each, Rfl: 1   naloxone (NARCAN) nasal spray 4 mg/0.1 mL, Place 1 spray into the nose as needed, for accidental overdose. (Patient not taking: Reported on 09/30/2022), Disp: 2 each, Rfl: 1   naloxone (NARCAN) nasal spray 4 mg/0.1 mL, Place 1 spray into the nose as needed for accidental overdose (Patient not taking: Reported on 09/30/2022), Disp: 2 each, Rfl: 1   oxyCODONE-acetaminophen (PERCOCET/ROXICET) 5-325 MG tablet, Take 1 tablet by mouth every 4 (four) hours as needed. For post op TKR, from surgeon. Doesn't take with Hydroxyzine. (Patient not taking: Reported on 03/25/2022), Disp: , Rfl:    potassium chloride SA (KLOR-CON M) 20 MEQ tablet, Take 1 tablet (20 mEq total) by mouth 2 (two) times daily for 3 days., Disp: 6 tablet, Rfl: 0   tamsulosin (FLOMAX) 0.4 MG CAPS capsule, Take 1 capsule (0.4 mg total) by mouth daily. (Patient not taking: Reported on 10/09/2020), Disp: 30 capsule, Rfl: 1   Vitamin D, Ergocalciferol, (DRISDOL) 1.25 MG (50000 UNIT) CAPS capsule, Take 50,000 Units by mouth every 7 (seven) days. (Patient not taking: Reported on 03/25/2022), Disp: , Rfl:  Medication Side Effects: none  Family Medical/ Social History: Changes? No  MENTAL HEALTH EXAM:  There were no vitals taken for this visit.There is no height or weight on file to calculate BMI.  General Appearance: Casual and Well Groomed  Eye Contact:  Good  Speech:  Clear and Coherent and Normal Rate  Volume:  Normal  Mood:  Euthymic  Affect:  Congruent  Thought Process:  Goal  Directed and Descriptions of Associations: Circumstantial  Orientation:  Full (Time, Place, and Person)  Thought Content: Logical   Suicidal Thoughts:  No  Homicidal Thoughts:  No  Memory:  WNL  Judgement:  Good  Insight:  Good  Psychomotor Activity:  Normal  Concentration:  Concentration: Good and Attention Span: Good  Recall:  Good  Fund of Knowledge: Good  Language: Good  Assets:  Desire for Improvement Financial Resources/Insurance Housing Leisure Time Resilience Transportation  ADL's:  Intact  Cognition: WNL  Prognosis:  Good   DIAGNOSES:    ICD-10-CM   1. Episodic mood disorder (HCC)  F39     2. Situational anxiety  F41.8     3. Insomnia, unspecified type  G47.00     4. Marital stress  Z63.0      Receiving Psychotherapy: Yes  With Burney Gauze  RECOMMENDATIONS:  PDMP was reviewed.  Xanax filled hold 04/29/2023.  On hydrocodone known to me. I provided 25 minutes of face to face  time during this encounter, including time spent before and after the visit in records review, medical decision making, counseling pertinent to today's visit, and charting.   He's doing well so no changes need to be made.  Increase (qty) Xanax 0.5 mg, 1 po bid prn anxiety.  Continue Depakote ER  250 mg daily. Continue trazodone 100 mg, 1-2 nightly as needed sleep. Cont Vitamin D 1,000 iu, thiamine, multivitamin daily Continue therapy with Burney Gauze. Return in 4 months.  Melony Overly, PA-C

## 2023-06-19 ENCOUNTER — Other Ambulatory Visit (HOSPITAL_COMMUNITY): Payer: Self-pay

## 2023-07-06 DIAGNOSIS — I10 Essential (primary) hypertension: Secondary | ICD-10-CM | POA: Diagnosis not present

## 2023-07-06 DIAGNOSIS — E782 Mixed hyperlipidemia: Secondary | ICD-10-CM | POA: Diagnosis not present

## 2023-07-06 DIAGNOSIS — N400 Enlarged prostate without lower urinary tract symptoms: Secondary | ICD-10-CM | POA: Diagnosis not present

## 2023-07-06 DIAGNOSIS — Z87891 Personal history of nicotine dependence: Secondary | ICD-10-CM | POA: Diagnosis not present

## 2023-07-06 DIAGNOSIS — E559 Vitamin D deficiency, unspecified: Secondary | ICD-10-CM | POA: Diagnosis not present

## 2023-07-06 DIAGNOSIS — Z933 Colostomy status: Secondary | ICD-10-CM | POA: Diagnosis not present

## 2023-07-06 DIAGNOSIS — M1990 Unspecified osteoarthritis, unspecified site: Secondary | ICD-10-CM | POA: Diagnosis not present

## 2023-07-06 DIAGNOSIS — R7989 Other specified abnormal findings of blood chemistry: Secondary | ICD-10-CM | POA: Diagnosis not present

## 2023-07-06 DIAGNOSIS — Z79899 Other long term (current) drug therapy: Secondary | ICD-10-CM | POA: Diagnosis not present

## 2023-07-07 ENCOUNTER — Other Ambulatory Visit: Payer: Self-pay | Admitting: Physician Assistant

## 2023-07-08 DIAGNOSIS — M1009 Idiopathic gout, multiple sites: Secondary | ICD-10-CM | POA: Diagnosis not present

## 2023-07-08 DIAGNOSIS — M0579 Rheumatoid arthritis with rheumatoid factor of multiple sites without organ or systems involvement: Secondary | ICD-10-CM | POA: Diagnosis not present

## 2023-07-08 DIAGNOSIS — G894 Chronic pain syndrome: Secondary | ICD-10-CM | POA: Diagnosis not present

## 2023-07-08 DIAGNOSIS — Z79899 Other long term (current) drug therapy: Secondary | ICD-10-CM | POA: Diagnosis not present

## 2023-07-08 DIAGNOSIS — Z6821 Body mass index (BMI) 21.0-21.9, adult: Secondary | ICD-10-CM | POA: Diagnosis not present

## 2023-07-08 DIAGNOSIS — M1991 Primary osteoarthritis, unspecified site: Secondary | ICD-10-CM | POA: Diagnosis not present

## 2023-07-27 ENCOUNTER — Other Ambulatory Visit: Payer: Self-pay | Admitting: Physician Assistant

## 2023-07-27 NOTE — Telephone Encounter (Signed)
 Lf 11/27

## 2023-07-28 ENCOUNTER — Other Ambulatory Visit (HOSPITAL_COMMUNITY): Payer: Self-pay

## 2023-07-28 DIAGNOSIS — Z933 Colostomy status: Secondary | ICD-10-CM | POA: Diagnosis not present

## 2023-07-28 DIAGNOSIS — Z79899 Other long term (current) drug therapy: Secondary | ICD-10-CM | POA: Diagnosis not present

## 2023-07-28 DIAGNOSIS — Z6822 Body mass index (BMI) 22.0-22.9, adult: Secondary | ICD-10-CM | POA: Diagnosis not present

## 2023-07-28 DIAGNOSIS — I10 Essential (primary) hypertension: Secondary | ICD-10-CM | POA: Diagnosis not present

## 2023-07-28 DIAGNOSIS — G8929 Other chronic pain: Secondary | ICD-10-CM | POA: Diagnosis not present

## 2023-07-28 DIAGNOSIS — M069 Rheumatoid arthritis, unspecified: Secondary | ICD-10-CM | POA: Diagnosis not present

## 2023-07-28 DIAGNOSIS — M545 Low back pain, unspecified: Secondary | ICD-10-CM | POA: Diagnosis not present

## 2023-07-28 DIAGNOSIS — F1120 Opioid dependence, uncomplicated: Secondary | ICD-10-CM | POA: Diagnosis not present

## 2023-07-28 DIAGNOSIS — Z9181 History of falling: Secondary | ICD-10-CM | POA: Diagnosis not present

## 2023-07-28 DIAGNOSIS — G894 Chronic pain syndrome: Secondary | ICD-10-CM | POA: Diagnosis not present

## 2023-07-28 DIAGNOSIS — R030 Elevated blood-pressure reading, without diagnosis of hypertension: Secondary | ICD-10-CM | POA: Diagnosis not present

## 2023-07-28 MED ORDER — HYDROCODONE-ACETAMINOPHEN 10-325 MG PO TABS
1.0000 | ORAL_TABLET | Freq: Four times a day (QID) | ORAL | 0 refills | Status: AC | PRN
  Filled 2023-07-28: qty 120, 30d supply, fill #0

## 2023-07-29 ENCOUNTER — Other Ambulatory Visit (HOSPITAL_COMMUNITY): Payer: Self-pay

## 2023-07-31 DIAGNOSIS — E782 Mixed hyperlipidemia: Secondary | ICD-10-CM | POA: Diagnosis not present

## 2023-07-31 DIAGNOSIS — Z79899 Other long term (current) drug therapy: Secondary | ICD-10-CM | POA: Diagnosis not present

## 2023-07-31 DIAGNOSIS — E559 Vitamin D deficiency, unspecified: Secondary | ICD-10-CM | POA: Diagnosis not present

## 2023-08-02 DIAGNOSIS — Z79899 Other long term (current) drug therapy: Secondary | ICD-10-CM | POA: Diagnosis not present

## 2023-08-06 DIAGNOSIS — M9905 Segmental and somatic dysfunction of pelvic region: Secondary | ICD-10-CM | POA: Diagnosis not present

## 2023-08-06 DIAGNOSIS — M9904 Segmental and somatic dysfunction of sacral region: Secondary | ICD-10-CM | POA: Diagnosis not present

## 2023-08-06 DIAGNOSIS — M5136 Other intervertebral disc degeneration, lumbar region with discogenic back pain only: Secondary | ICD-10-CM | POA: Diagnosis not present

## 2023-08-06 DIAGNOSIS — M9903 Segmental and somatic dysfunction of lumbar region: Secondary | ICD-10-CM | POA: Diagnosis not present

## 2023-08-07 DIAGNOSIS — Z0000 Encounter for general adult medical examination without abnormal findings: Secondary | ICD-10-CM | POA: Diagnosis not present

## 2023-08-07 DIAGNOSIS — Z9181 History of falling: Secondary | ICD-10-CM | POA: Diagnosis not present

## 2023-08-19 DIAGNOSIS — E785 Hyperlipidemia, unspecified: Secondary | ICD-10-CM | POA: Diagnosis not present

## 2023-08-19 DIAGNOSIS — I10 Essential (primary) hypertension: Secondary | ICD-10-CM | POA: Diagnosis not present

## 2023-08-19 DIAGNOSIS — I358 Other nonrheumatic aortic valve disorders: Secondary | ICD-10-CM | POA: Diagnosis not present

## 2023-08-19 DIAGNOSIS — I480 Paroxysmal atrial fibrillation: Secondary | ICD-10-CM | POA: Diagnosis not present

## 2023-08-28 ENCOUNTER — Other Ambulatory Visit: Payer: Self-pay | Admitting: Physician Assistant

## 2023-08-28 DIAGNOSIS — D84821 Immunodeficiency due to drugs: Secondary | ICD-10-CM | POA: Diagnosis not present

## 2023-08-28 DIAGNOSIS — E785 Hyperlipidemia, unspecified: Secondary | ICD-10-CM | POA: Diagnosis not present

## 2023-08-28 DIAGNOSIS — I10 Essential (primary) hypertension: Secondary | ICD-10-CM | POA: Diagnosis not present

## 2023-08-28 DIAGNOSIS — Z008 Encounter for other general examination: Secondary | ICD-10-CM | POA: Diagnosis not present

## 2023-08-28 DIAGNOSIS — Z933 Colostomy status: Secondary | ICD-10-CM | POA: Diagnosis not present

## 2023-08-28 DIAGNOSIS — G4700 Insomnia, unspecified: Secondary | ICD-10-CM | POA: Diagnosis not present

## 2023-08-28 DIAGNOSIS — Z9049 Acquired absence of other specified parts of digestive tract: Secondary | ICD-10-CM | POA: Diagnosis not present

## 2023-08-28 DIAGNOSIS — M069 Rheumatoid arthritis, unspecified: Secondary | ICD-10-CM | POA: Diagnosis not present

## 2023-08-28 DIAGNOSIS — F17210 Nicotine dependence, cigarettes, uncomplicated: Secondary | ICD-10-CM | POA: Diagnosis not present

## 2023-08-28 DIAGNOSIS — F39 Unspecified mood [affective] disorder: Secondary | ICD-10-CM | POA: Diagnosis not present

## 2023-08-28 DIAGNOSIS — Z79891 Long term (current) use of opiate analgesic: Secondary | ICD-10-CM | POA: Diagnosis not present

## 2023-10-05 ENCOUNTER — Other Ambulatory Visit: Payer: Self-pay | Admitting: Physician Assistant

## 2023-10-15 ENCOUNTER — Ambulatory Visit: Payer: Medicare PPO | Admitting: Physician Assistant

## 2023-11-02 DIAGNOSIS — Z79899 Other long term (current) drug therapy: Secondary | ICD-10-CM | POA: Diagnosis not present

## 2023-11-02 DIAGNOSIS — I10 Essential (primary) hypertension: Secondary | ICD-10-CM | POA: Diagnosis not present

## 2023-11-02 DIAGNOSIS — Z933 Colostomy status: Secondary | ICD-10-CM | POA: Diagnosis not present

## 2023-11-02 DIAGNOSIS — F418 Other specified anxiety disorders: Secondary | ICD-10-CM | POA: Diagnosis not present

## 2023-11-02 DIAGNOSIS — M069 Rheumatoid arthritis, unspecified: Secondary | ICD-10-CM | POA: Diagnosis not present

## 2023-11-02 DIAGNOSIS — E782 Mixed hyperlipidemia: Secondary | ICD-10-CM | POA: Diagnosis not present

## 2023-11-02 DIAGNOSIS — N400 Enlarged prostate without lower urinary tract symptoms: Secondary | ICD-10-CM | POA: Diagnosis not present

## 2023-11-02 DIAGNOSIS — E559 Vitamin D deficiency, unspecified: Secondary | ICD-10-CM | POA: Diagnosis not present

## 2023-11-02 DIAGNOSIS — M1990 Unspecified osteoarthritis, unspecified site: Secondary | ICD-10-CM | POA: Diagnosis not present

## 2023-11-02 DIAGNOSIS — R7989 Other specified abnormal findings of blood chemistry: Secondary | ICD-10-CM | POA: Diagnosis not present

## 2023-11-02 DIAGNOSIS — G894 Chronic pain syndrome: Secondary | ICD-10-CM | POA: Diagnosis not present

## 2023-11-02 DIAGNOSIS — Z87891 Personal history of nicotine dependence: Secondary | ICD-10-CM | POA: Diagnosis not present

## 2023-11-03 ENCOUNTER — Other Ambulatory Visit: Payer: Self-pay | Admitting: Physician Assistant

## 2023-12-02 DIAGNOSIS — M1009 Idiopathic gout, multiple sites: Secondary | ICD-10-CM | POA: Diagnosis not present

## 2023-12-02 DIAGNOSIS — M0579 Rheumatoid arthritis with rheumatoid factor of multiple sites without organ or systems involvement: Secondary | ICD-10-CM | POA: Diagnosis not present

## 2023-12-15 ENCOUNTER — Other Ambulatory Visit: Payer: Self-pay | Admitting: Physician Assistant

## 2023-12-16 ENCOUNTER — Ambulatory Visit: Admitting: Physician Assistant

## 2024-02-02 ENCOUNTER — Ambulatory Visit (INDEPENDENT_AMBULATORY_CARE_PROVIDER_SITE_OTHER): Payer: Self-pay | Admitting: Physician Assistant

## 2024-02-02 DIAGNOSIS — Z91199 Patient's noncompliance with other medical treatment and regimen due to unspecified reason: Secondary | ICD-10-CM

## 2024-02-02 NOTE — Progress Notes (Signed)
 No show

## 2024-02-18 DIAGNOSIS — M1990 Unspecified osteoarthritis, unspecified site: Secondary | ICD-10-CM | POA: Diagnosis not present

## 2024-02-18 DIAGNOSIS — Z79899 Other long term (current) drug therapy: Secondary | ICD-10-CM | POA: Diagnosis not present

## 2024-02-18 DIAGNOSIS — R7989 Other specified abnormal findings of blood chemistry: Secondary | ICD-10-CM | POA: Diagnosis not present

## 2024-02-18 DIAGNOSIS — N400 Enlarged prostate without lower urinary tract symptoms: Secondary | ICD-10-CM | POA: Diagnosis not present

## 2024-02-18 DIAGNOSIS — E782 Mixed hyperlipidemia: Secondary | ICD-10-CM | POA: Diagnosis not present

## 2024-02-18 DIAGNOSIS — I10 Essential (primary) hypertension: Secondary | ICD-10-CM | POA: Diagnosis not present

## 2024-02-18 DIAGNOSIS — Z933 Colostomy status: Secondary | ICD-10-CM | POA: Diagnosis not present

## 2024-02-18 DIAGNOSIS — E559 Vitamin D deficiency, unspecified: Secondary | ICD-10-CM | POA: Diagnosis not present

## 2024-02-18 DIAGNOSIS — Z6821 Body mass index (BMI) 21.0-21.9, adult: Secondary | ICD-10-CM | POA: Diagnosis not present

## 2024-02-18 DIAGNOSIS — Z87891 Personal history of nicotine dependence: Secondary | ICD-10-CM | POA: Diagnosis not present

## 2024-02-18 DIAGNOSIS — R739 Hyperglycemia, unspecified: Secondary | ICD-10-CM | POA: Diagnosis not present
